# Patient Record
Sex: Male | Born: 1955 | Race: Black or African American | Hispanic: No | Marital: Married | Smoking: Never smoker
Health system: Southern US, Community
[De-identification: ages and names within clinical notes are randomized; demographics above are authoritative.]

## PROBLEM LIST (undated history)

## (undated) DIAGNOSIS — M545 Low back pain, unspecified: Secondary | ICD-10-CM

## (undated) DIAGNOSIS — I10 Essential (primary) hypertension: Secondary | ICD-10-CM

## (undated) DIAGNOSIS — R51 Headache: Secondary | ICD-10-CM

## (undated) DIAGNOSIS — Z550 Illiteracy and low-level literacy: Secondary | ICD-10-CM

## (undated) DIAGNOSIS — K219 Gastro-esophageal reflux disease without esophagitis: Secondary | ICD-10-CM

## (undated) DIAGNOSIS — E785 Hyperlipidemia, unspecified: Secondary | ICD-10-CM

## (undated) DIAGNOSIS — R519 Headache, unspecified: Secondary | ICD-10-CM

## (undated) DIAGNOSIS — G8929 Other chronic pain: Secondary | ICD-10-CM

## (undated) HISTORY — DX: Hyperlipidemia, unspecified: E78.5

## (undated) HISTORY — DX: Gastro-esophageal reflux disease without esophagitis: K21.9

## (undated) HISTORY — DX: Low back pain: M54.5

## (undated) HISTORY — DX: Illiteracy and low-level literacy: Z55.0

## (undated) HISTORY — DX: Headache, unspecified: R51.9

## (undated) HISTORY — DX: Headache: R51

## (undated) HISTORY — DX: Low back pain, unspecified: M54.50

## (undated) HISTORY — DX: Essential (primary) hypertension: I10

## (undated) HISTORY — PX: OTHER SURGICAL HISTORY: SHX169

## (undated) HISTORY — DX: Other chronic pain: G89.29

---

## 1998-03-25 ENCOUNTER — Emergency Department (HOSPITAL_COMMUNITY): Admission: EM | Admit: 1998-03-25 | Discharge: 1998-03-25 | Payer: Self-pay | Admitting: Emergency Medicine

## 1999-09-16 DIAGNOSIS — E1165 Type 2 diabetes mellitus with hyperglycemia: Secondary | ICD-10-CM

## 1999-09-16 DIAGNOSIS — E118 Type 2 diabetes mellitus with unspecified complications: Secondary | ICD-10-CM

## 1999-09-16 DIAGNOSIS — E1140 Type 2 diabetes mellitus with diabetic neuropathy, unspecified: Secondary | ICD-10-CM | POA: Insufficient documentation

## 1999-09-16 DIAGNOSIS — IMO0002 Reserved for concepts with insufficient information to code with codable children: Secondary | ICD-10-CM | POA: Insufficient documentation

## 2002-02-27 ENCOUNTER — Emergency Department (HOSPITAL_COMMUNITY): Admission: EM | Admit: 2002-02-27 | Discharge: 2002-02-28 | Payer: Self-pay | Admitting: Emergency Medicine

## 2006-03-17 HISTORY — PX: LUMBAR LAMINECTOMY: SHX95

## 2006-05-15 ENCOUNTER — Encounter: Admission: RE | Admit: 2006-05-15 | Discharge: 2006-06-04 | Payer: Self-pay | Admitting: Internal Medicine

## 2006-05-20 ENCOUNTER — Emergency Department (HOSPITAL_COMMUNITY): Admission: EM | Admit: 2006-05-20 | Discharge: 2006-05-21 | Payer: Self-pay | Admitting: Emergency Medicine

## 2006-06-08 ENCOUNTER — Encounter: Admission: RE | Admit: 2006-06-08 | Discharge: 2006-06-18 | Payer: Self-pay | Admitting: Emergency Medicine

## 2006-06-28 ENCOUNTER — Emergency Department (HOSPITAL_COMMUNITY): Admission: EM | Admit: 2006-06-28 | Discharge: 2006-06-28 | Payer: Self-pay | Admitting: Emergency Medicine

## 2006-07-14 ENCOUNTER — Encounter: Payer: Self-pay | Admitting: Internal Medicine

## 2006-10-28 ENCOUNTER — Ambulatory Visit (HOSPITAL_COMMUNITY): Admission: RE | Admit: 2006-10-28 | Discharge: 2006-10-29 | Payer: Self-pay | Admitting: Neurosurgery

## 2007-05-05 ENCOUNTER — Encounter: Admission: RE | Admit: 2007-05-05 | Discharge: 2007-05-05 | Payer: Self-pay | Admitting: Neurosurgery

## 2008-04-30 ENCOUNTER — Encounter: Admission: RE | Admit: 2008-04-30 | Discharge: 2008-04-30 | Payer: Self-pay | Admitting: Neurosurgery

## 2008-09-15 ENCOUNTER — Ambulatory Visit (HOSPITAL_COMMUNITY): Admission: RE | Admit: 2008-09-15 | Discharge: 2008-09-15 | Payer: Self-pay | Admitting: Internal Medicine

## 2008-09-15 ENCOUNTER — Encounter: Payer: Self-pay | Admitting: Internal Medicine

## 2008-09-15 ENCOUNTER — Ambulatory Visit: Payer: Self-pay | Admitting: Internal Medicine

## 2008-09-15 DIAGNOSIS — M545 Low back pain, unspecified: Secondary | ICD-10-CM | POA: Insufficient documentation

## 2008-09-15 DIAGNOSIS — K219 Gastro-esophageal reflux disease without esophagitis: Secondary | ICD-10-CM | POA: Insufficient documentation

## 2008-09-15 DIAGNOSIS — K5900 Constipation, unspecified: Secondary | ICD-10-CM | POA: Insufficient documentation

## 2008-09-15 DIAGNOSIS — R262 Difficulty in walking, not elsewhere classified: Secondary | ICD-10-CM | POA: Insufficient documentation

## 2008-09-15 DIAGNOSIS — M549 Dorsalgia, unspecified: Secondary | ICD-10-CM | POA: Insufficient documentation

## 2008-09-19 ENCOUNTER — Encounter: Payer: Self-pay | Admitting: Internal Medicine

## 2009-05-08 ENCOUNTER — Telehealth: Payer: Self-pay | Admitting: Internal Medicine

## 2009-05-16 ENCOUNTER — Telehealth: Payer: Self-pay | Admitting: Internal Medicine

## 2009-05-29 ENCOUNTER — Ambulatory Visit: Payer: Self-pay | Admitting: Infectious Diseases

## 2009-05-29 LAB — CONVERTED CEMR LAB: Hgb A1c MFr Bld: 12.4 %

## 2009-05-30 ENCOUNTER — Telehealth: Payer: Self-pay | Admitting: Internal Medicine

## 2009-06-01 ENCOUNTER — Ambulatory Visit: Payer: Self-pay | Admitting: Infectious Diseases

## 2009-06-01 DIAGNOSIS — I10 Essential (primary) hypertension: Secondary | ICD-10-CM | POA: Insufficient documentation

## 2009-06-01 LAB — CONVERTED CEMR LAB
CO2: 26 meq/L (ref 19–32)
Calcium: 9.2 mg/dL (ref 8.4–10.5)
Creatinine, Ser: 0.91 mg/dL (ref 0.40–1.50)
Creatinine, Urine: 88.3 mg/dL
HDL: 54 mg/dL (ref >39)
LDL Cholesterol: 81 mg/dL (ref 0–99)
Microalb, Ur: 0.5 mg/dL (ref 0.00–1.89)
Total CHOL/HDL Ratio: 2.7

## 2009-06-07 ENCOUNTER — Ambulatory Visit: Payer: Self-pay | Admitting: Internal Medicine

## 2009-06-07 LAB — CONVERTED CEMR LAB: Blood Glucose, Home Monitor: 2 mg/dL

## 2009-06-19 ENCOUNTER — Ambulatory Visit: Payer: Self-pay | Admitting: Internal Medicine

## 2009-07-09 ENCOUNTER — Ambulatory Visit: Payer: Self-pay | Admitting: Internal Medicine

## 2009-07-09 LAB — CONVERTED CEMR LAB: Blood Glucose, Fingerstick: 130

## 2009-07-24 ENCOUNTER — Encounter (INDEPENDENT_AMBULATORY_CARE_PROVIDER_SITE_OTHER): Payer: Self-pay | Admitting: *Deleted

## 2009-07-24 ENCOUNTER — Inpatient Hospital Stay (HOSPITAL_COMMUNITY): Admission: EM | Admit: 2009-07-24 | Discharge: 2009-07-26 | Payer: Self-pay | Admitting: Emergency Medicine

## 2009-08-10 ENCOUNTER — Encounter (INDEPENDENT_AMBULATORY_CARE_PROVIDER_SITE_OTHER): Payer: Self-pay | Admitting: *Deleted

## 2009-09-11 ENCOUNTER — Ambulatory Visit: Payer: Self-pay | Admitting: Internal Medicine

## 2009-09-11 DIAGNOSIS — J309 Allergic rhinitis, unspecified: Secondary | ICD-10-CM | POA: Insufficient documentation

## 2009-09-11 LAB — CONVERTED CEMR LAB: Hgb A1c MFr Bld: 9.3 %

## 2009-09-12 ENCOUNTER — Encounter: Payer: Self-pay | Admitting: Internal Medicine

## 2009-09-13 ENCOUNTER — Telehealth: Payer: Self-pay | Admitting: Internal Medicine

## 2009-09-13 ENCOUNTER — Ambulatory Visit: Payer: Self-pay | Admitting: Gastroenterology

## 2009-09-13 DIAGNOSIS — R51 Headache: Secondary | ICD-10-CM

## 2009-09-13 DIAGNOSIS — R519 Headache, unspecified: Secondary | ICD-10-CM | POA: Insufficient documentation

## 2009-09-13 LAB — CONVERTED CEMR LAB
ALT: 22 units/L (ref 0–53)
AST: 19 units/L (ref 0–37)
Albumin: 4 g/dL (ref 3.5–5.2)
Alkaline Phosphatase: 55 units/L (ref 39–117)
BUN: 8 mg/dL (ref 6–23)
Calcium: 9.5 mg/dL (ref 8.4–10.5)
Chloride: 99 meq/L (ref 96–112)
Creatinine, Ser: 0.7 mg/dL (ref 0.4–1.5)
GFR calc non Af Amer: 150.93 mL/min (ref >60)
Glucose, Bld: 282 mg/dL — ABNORMAL HIGH (ref 70–99)
Iron: 103 ug/dL (ref 42–165)
Lipase: 32 units/L (ref 11.0–59.0)
Lymphs Abs: 1.7 10*3/uL (ref 0.7–4.0)
MCHC: 34.5 g/dL (ref 30.0–36.0)
MCV: 84.1 fL (ref 78.0–100.0)
Monocytes Relative: 10.5 % (ref 3.0–12.0)
Neutrophils Relative %: 40.2 % — ABNORMAL LOW (ref 43.0–77.0)
Platelets: 206 10*3/uL (ref 150.0–400.0)
RBC: 4.87 M/uL (ref 4.22–5.81)
RDW: 14.1 % (ref 11.5–14.6)
Saturation Ratios: 27.4 % (ref 20.0–50.0)
TSH: 1.48 microintl units/mL (ref 0.35–5.50)
Total Bilirubin: 1.1 mg/dL (ref 0.3–1.2)
Total Protein: 7.3 g/dL (ref 6.0–8.3)
Transferrin: 268.7 mg/dL (ref 212.0–360.0)

## 2009-09-14 ENCOUNTER — Encounter: Payer: Self-pay | Admitting: Gastroenterology

## 2009-09-19 ENCOUNTER — Encounter: Payer: Self-pay | Admitting: Internal Medicine

## 2009-09-24 ENCOUNTER — Telehealth (INDEPENDENT_AMBULATORY_CARE_PROVIDER_SITE_OTHER): Payer: Self-pay | Admitting: *Deleted

## 2009-09-28 ENCOUNTER — Ambulatory Visit: Payer: Self-pay | Admitting: Internal Medicine

## 2009-09-28 ENCOUNTER — Telehealth: Payer: Self-pay | Admitting: Internal Medicine

## 2009-09-28 LAB — CONVERTED CEMR LAB: Blood Glucose, Home Monitor: 3 mg/dL

## 2009-10-02 ENCOUNTER — Telehealth: Payer: Self-pay | Admitting: Internal Medicine

## 2009-10-12 ENCOUNTER — Telehealth (INDEPENDENT_AMBULATORY_CARE_PROVIDER_SITE_OTHER): Payer: Self-pay | Admitting: *Deleted

## 2009-10-23 ENCOUNTER — Ambulatory Visit: Payer: Self-pay | Admitting: Gastroenterology

## 2009-10-23 LAB — HM COLONOSCOPY: HM Colonoscopy: NORMAL

## 2009-10-23 LAB — CONVERTED CEMR LAB: UREASE: POSITIVE

## 2009-10-26 ENCOUNTER — Encounter: Payer: Self-pay | Admitting: Gastroenterology

## 2009-11-13 ENCOUNTER — Encounter: Payer: Self-pay | Admitting: Internal Medicine

## 2009-11-13 ENCOUNTER — Ambulatory Visit: Payer: Self-pay | Admitting: Internal Medicine

## 2009-11-16 ENCOUNTER — Ambulatory Visit: Payer: Self-pay | Admitting: Internal Medicine

## 2009-11-16 ENCOUNTER — Encounter: Payer: Self-pay | Admitting: Internal Medicine

## 2009-11-16 DIAGNOSIS — M2550 Pain in unspecified joint: Secondary | ICD-10-CM | POA: Insufficient documentation

## 2009-11-16 LAB — CONVERTED CEMR LAB
Bilirubin Urine: NEGATIVE
Blood in Urine, dipstick: NEGATIVE
Chloride: 104 meq/L (ref 96–112)
Creatinine, Ser: 0.83 mg/dL (ref 0.40–1.50)
Glucose, Bld: 125 mg/dL — ABNORMAL HIGH (ref 70–99)
Hemoglobin: 14.5 g/dL (ref 13.0–17.0)
Hgb A1c MFr Bld: 7.2 %
MCHC: 36.4 g/dL — ABNORMAL HIGH (ref 30.0–36.0)
Nitrite: NEGATIVE
Potassium: 4.3 meq/L (ref 3.5–5.3)
RDW: 13.7 % (ref 11.5–15.5)
Rhuematoid fact SerPl-aCnc: <20 intl units/mL (ref 0–20)
Sed Rate: 2 mm/hr (ref 0–16)
WBC Urine, dipstick: NEGATIVE
WBC: 5.5 10*3/uL (ref 4.0–10.5)

## 2009-11-28 ENCOUNTER — Telehealth: Payer: Self-pay | Admitting: Licensed Clinical Social Worker

## 2009-11-29 ENCOUNTER — Encounter: Payer: Self-pay | Admitting: Internal Medicine

## 2009-11-29 LAB — HM DIABETES EYE EXAM

## 2009-12-10 ENCOUNTER — Telehealth (INDEPENDENT_AMBULATORY_CARE_PROVIDER_SITE_OTHER): Payer: Self-pay | Admitting: *Deleted

## 2009-12-18 ENCOUNTER — Telehealth (INDEPENDENT_AMBULATORY_CARE_PROVIDER_SITE_OTHER): Payer: Self-pay | Admitting: *Deleted

## 2010-01-09 ENCOUNTER — Encounter: Payer: Self-pay | Admitting: Internal Medicine

## 2010-01-09 ENCOUNTER — Encounter
Admission: RE | Admit: 2010-01-09 | Discharge: 2010-03-05 | Payer: Self-pay | Source: Home / Self Care | Attending: Internal Medicine | Admitting: Internal Medicine

## 2010-01-22 ENCOUNTER — Ambulatory Visit: Payer: Self-pay | Admitting: Internal Medicine

## 2010-01-22 LAB — CONVERTED CEMR LAB: Blood Glucose, Fingerstick: 138

## 2010-01-22 LAB — HM DIABETES FOOT EXAM: HM Diabetic Foot Exam: NORMAL

## 2010-01-28 ENCOUNTER — Encounter: Payer: Self-pay | Admitting: Internal Medicine

## 2010-04-14 LAB — CONVERTED CEMR LAB
BUN: 14 mg/dL (ref 6–23)
CO2: 23 meq/L (ref 19–32)
Glucose, Bld: 133 mg/dL — ABNORMAL HIGH (ref 70–99)
LDL Cholesterol: 86 mg/dL (ref 0–99)
Microalb Creat Ratio: 6.2 mg/g (ref 0.0–30.0)
Potassium: 4.2 meq/L (ref 3.5–5.3)
Sodium: 140 meq/L (ref 135–145)
TSH: 0.92 microintl units/mL (ref 0.350–4.500)
Total CHOL/HDL Ratio: 2.9
VLDL: 13 mg/dL (ref 0–40)

## 2010-04-16 NOTE — Assessment & Plan Note (Signed)
Summary: FLP AND BP CHECK/CH   Vital Signs:  Patient profile:   55 year old male Height:      69 inches (175.26 cm) Weight:      151.6 pounds (69.18 kg) BMI:     22.47 Temp:     94.1 degrees F (34.50 degrees C) oral Pulse rate:   67 / minute BP sitting:   134 / 88  (right arm) Cuff size:   regular  Vitals Entered By: Theotis Barrio NT II (June 01, 2009 10:56 AM) CC: PATIENT HERE ONLY FOR BP CHECK AND LABS  Have you ever been in a relationship where you felt threatened, hurt or afraid?No   Does patient need assistance? Functional Status Self care Ambulation Normal Comments PATIENT IS HERE ONLY FOR LABS AND BP CHECK   CC:  PATIENT HERE ONLY FOR BP CHECK AND LABS.  History of Present Illness: Mitchell Page is in here for a short term follow up. Recently started back on his DM meds after about a 66mo hiatus, when he went back home to [place] and missed all his meds. He doesn't have any new complaints today. Came here fasting for an FLP.    Preventive Screening-Counseling & Management  Alcohol-Tobacco     Alcohol drinks/day: 0     Smoking Status: never  Caffeine-Diet-Exercise     Caffeine use/day: 0     Does Patient Exercise: no  Current Medications (verified): 1)  Tramadol Hcl 50 Mg Tabs (Tramadol Hcl) .... One Tab As Needed For Back Pain, Up To Four Times A Day 2)  Omeprazole 40 Mg Cpdr (Omeprazole) .... Take 1 Tablet By Mouth Two Times A Day 3)  Naprosyn 500 Mg Tabs (Naproxen) .... Take 1 Tablet By Mouth Two Times A Day With Meals 4)  Gabapentin 300 Mg Caps (Gabapentin) .... Take 1 Tablet By Mouth Once A Day For 3 Days Then One Tablet By Mouth Bid 5)  Glyburide 5 Mg Tabs (Glyburide) .... 2 Tabs Q Am 6)  Metformin Hcl 1000 Mg Tabs (Metformin Hcl) .... Take 1 Tablet By Mouth Two Times A Day 7)  Prodigy Blood Glucose Monitor W/device Kit (Blood Glucose Monitoring Suppl) .... Check Your Blood Sugarbefore Breakfast 8)  Prodigy Twist Top Lancets 28g  Misc (Lancets) .... Check  Yoiur Blood Glucose Before Breakfast 9)  Exactech Test  Strp (Glucose Blood) .... Check Your Blood Glucose Before Breakfast 10)  Lisinopril 5 Mg Tabs (Lisinopril) .... Take 1 Tablet By Mouth Once A Day  Allergies (verified): No Known Drug Allergies  Past History:  Past Medical History: Last updated: 09/15/2008 DM, type 2 diagnosed 2001 GERD LBP r/t herniated disc -  chronic;s/p laminectomy in 2008 at [facility]. Patient states that he herniated his spine performing heavy lifting for 10 years at his job. Last MRI 04/2008.   Past Surgical History: Last updated: 09/15/2008 L4-5 spine Laminectomy 2008  Family History: Last updated: 09/15/2008 Unknown per patient  Social History: Last updated: 09/15/2008 Immigrated from [place] in 1990. 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  Risk Factors: Alcohol Use: 0 (06/01/2009) Caffeine Use: 0 (06/01/2009) Exercise: no (06/01/2009)  Risk Factors: Smoking Status: never (06/01/2009)  Review of Systems       Review of System: Negative except per HPI.    Physical Exam  General:  alert and well-developed.   Lungs:  normal respiratory effort and normal breath sounds.  Heart:  normal rate and regular rhythm.   Abdomen:  soft and non-tender.   Pulses:  normal peripheral pulses  Extremities:  no cyanosis, clubbing or edema  Neurologic:  non focal  Psych:  normally interactive.     Impression & Recommendations:  Problem # 1:  DIABETES-TYPE 2 (ICD-250.00) Recently restarted on meds. His A1C is high, will have to monitor closely, he might need insulin treatment. I will have him come see Ms. Victory Dakin for DM education.   Data reviewed: A1c: 12.4 (05/29/2009 1:38:22 PM)  MICROALB/CR: 6.2 (09/15/2008 8:20:00 PM) LDL: 86 (09/15/2008 9:20:00 PM), repeat today.  EYE: normal (09/15/2008 1:44:09 PM) FOOT: yes  (05/29/2009 1:38:22 PM) WEIGHT: 22.47 (06/01/2009 10:44:14 AM)   His updated medication list for this problem includes:    Glyburide 5 Mg Tabs (Glyburide) .Marland Kitchen... 2 tabs q am    Metformin Hcl 1000 Mg Tabs (Metformin hcl) .Marland Kitchen... Take 1 tablet by mouth two times a day    Lisinopril 5 Mg Tabs (Lisinopril) .Marland Kitchen... Take 1 tablet by mouth once a day  Problem # 2:  HYPERTENSION (ICD-401.9) Will start him on low dose ACEi, check b-met in two week's time.   His updated medication list for this problem includes:    Lisinopril 5 Mg Tabs (Lisinopril) .Marland Kitchen... Take 1 tablet by mouth once a day  Complete Medication List: 1)  Tramadol Hcl 50 Mg Tabs (Tramadol hcl) .... One tab as needed for back pain, up to four times a day 2)  Omeprazole 40 Mg Cpdr (Omeprazole) .... Take 1 tablet by mouth two times a day 3)  Naprosyn 500 Mg Tabs (Naproxen) .... Take 1 tablet by mouth two times a day with meals 4)  Gabapentin 300 Mg Caps (Gabapentin) .... Take 1 tablet by mouth once a day for 3 days then one tablet by mouth bid 5)  Glyburide 5 Mg Tabs (Glyburide) .... 2 tabs q am 6)  Metformin Hcl 1000 Mg Tabs (Metformin hcl) .... Take 1 tablet by mouth two times a day 7)  Prodigy Blood Glucose Monitor W/device Kit (Blood glucose monitoring suppl) .... Check your blood sugarbefore breakfast 8)  Prodigy Twist Top Lancets 28g Misc (Lancets) .... Check yoiur blood glucose before breakfast 9)  Exactech Test Strp (Glucose blood) .... Check your blood glucose before breakfast 10)  Lisinopril 5 Mg Tabs (Lisinopril) .... Take 1 tablet by mouth once a day  Patient Instructions: 1)  Please schedule an appointment to see Ms. Jamison Neighbor for help with diabetes and nutrition education. Please bring in your glucose meter with you when you come to see her.  2)  Please schedule a follow-up appointment in 2 weeks, for a repeat blood pressure and b-met.  3)  We will let you know if anything wrong with your lab work.   Prescriptions: LISINOPRIL  5 MG TABS (LISINOPRIL) Take 1 tablet by mouth once a day  #30 x 5   Entered and Authorized by:   Zara Council MD   Signed by:   Zara Council MD on 06/01/2009   Method used:   Faxed to ...       [facility] (retail)       [address] [place], [place]  [address]       Ph: [phone]       Fax: [phone]   RxID:   [id number]   Prevention & Chronic Care Immunizations   Influenza vaccine: Fluvax 3+  (05/29/2009)  Influenza vaccine deferral: Deferred  (06/01/2009)   Influenza vaccine due: 05/29/2009    Tetanus booster: 05/29/2009: Tdap   Td booster deferral: Deferred  (06/01/2009)   Tetanus booster due: 05/30/2019    Pneumococcal vaccine: Not documented   Pneumococcal vaccine deferral: Deferred  (06/01/2009)  Colorectal Screening   Hemoccult: Not documented   Hemoccult action/deferral: Ordered  (05/29/2009)   Hemoccult due: 05/30/2010    Colonoscopy: Not documented   Colonoscopy action/deferral: GI referral  (05/29/2009)  Other Screening   PSA: Not documented   PSA action/deferral: Discussed-PSA declined  (05/29/2009)   Smoking status: never  (06/01/2009)  Diabetes Mellitus   HgbA1C: 12.4  (05/29/2009)    Eye exam: normal  (09/15/2008)   Eye exam due: 09/15/2009    Foot exam: yes  (05/29/2009)   Foot exam action/deferral: Do today   High risk foot: Yes  (05/29/2009)   Foot care education: Done  (05/29/2009)   Foot exam due: 09/15/2009    Urine microalbumin/creatinine ratio: 6.2  (09/15/2008)    Diabetes flowsheet reviewed?: Yes   Progress toward A1C goal: Unchanged  Lipids   Total Cholesterol: 151  (09/15/2008)   Lipid panel action/deferral: Lipid Panel ordered   LDL: 86  (09/15/2008)   LDL Direct: Not documented   HDL: 52  (09/15/2008)   Triglycerides: 66  (09/15/2008)   Lipid panel due: 11/29/2009  Hypertension   Last Blood Pressure: 134 / 88  (06/01/2009)   Serum creatinine: 0.90  (09/15/2008)   Serum  potassium 4.2  (09/15/2008)  Self-Management Support :   Personal Goals (by the next clinic visit) :     Personal A1C goal: 7  (05/29/2009)     Personal blood pressure goal: 130/80  (05/29/2009)     Personal LDL goal: 100  (05/29/2009)    Diabetes self-management support: CBG self-monitoring log, Pre-printed educational material  (05/29/2009)    Hypertension self-management support: Not documented

## 2010-04-16 NOTE — Letter (Signed)
Summary: Patient Notice- Colon Biospy Results  [facility]  [address] [place], [place] [address]   Phone: [phone]  Fax: [phone]        October 26, 2009 MRN: [id number]    [facility] [address] [place], [place]  [address]    Dear Mr. KLICH,  I am pleased to inform you that the biopsies taken during your recent colonoscopy did not show any evidence of cancer upon pathologic examination.  Additional information/recommendations:  __No further action is needed at this time.  Please follow-up with      your primary care physician for your other healthcare needs.  __Please call [phone] to schedule a return visit to review      your condition.  X__Continue with the treatment plan as outlined on the day of your      exam.  __You should have a repeat colonoscopy examination for this problem           in _ years.  Please call us if you are having persistent problems or have questions about your condition that have not been fully answered at this time.  Sincerely,  Mardella Layman MD [facility]   This letter has been electronically signed by your physician.  Appended Document: Patient Notice- Colon Biospy Results letter mailed

## 2010-04-16 NOTE — Progress Notes (Signed)
Summary: Novolog insulin and diabetes testing supplies/dmr  Phone Note Outgoing Call   Call placed by: Jamison Neighbor RD,CDE,  September 28, 2009 2:23 PM Summary of Call: needs diabetes supplies sent to new pharmacy- [facility] [address] [employer] prescription. The meter, strip and lancet prescription was sent to wrong pharmacy- how should we correct that?   Follow-up for Phone Call        Called anfd faxed to [facility] Follow-up by: Angelina Ok RN,  September 28, 2009 4:23 PM    New/Updated Medications: PRODIGY MINI PEN NEEDLES 31G X 5 MM MISC (INSULIN PEN NEEDLE) use to inject lantus insulin once a day and Novolog insulin three times a day as instructed (250.00) NOVOLOG FLEXPEN 100 UNIT/ML SOLN (INSULIN ASPART) inject 4 units before each meal- take 10 minutes before meal (after you check your blood sugar) Prescriptions: PRODIGY MINI PEN NEEDLES 31G X 5 MM MISC (INSULIN PEN NEEDLE) use to inject lantus insulin once a day and Novolog insulin three times a day as instructed (250.00)  #200 x 10   Entered by:   Jamison Neighbor RD,CDE   Authorized by:   Julaine Fusi  DO   Signed by:   Angelina Ok RN on 09/28/2009   Method used:   Electronically to        [facility] [address] (retail)       [address] [place], [place]  [address]       Ph: [phone]       Fax: [phone]   RxID:   [id number] NOVOLOG FLEXPEN 100 UNIT/ML SOLN (INSULIN ASPART) inject 4 units before each meal- take 10 minutes before meal (after you check your blood sugar)  #1 box x 2   Entered by:   Jamison Neighbor RD,CDE   Authorized by:   Julaine Fusi  DO   Signed by:   Angelina Ok RN on 09/28/2009   Method used:   Electronically to        [facility] [address] (retail)       [address] [place], [place]  [address]       Ph: [phone]       Fax: [phone]   RxID:   [id number]

## 2010-04-16 NOTE — Progress Notes (Signed)
Summary: meter & lancet prescription/dmr  Phone Note Call from Patient Call back at Home Phone [phone]   Caller: Patient Summary of Call: Patietn called to ask if his presciptions had been transferred to [facility] on [address]. He reports blood sugars are 163 before breakfast yesterday and 174 before breakfast today: he stopped glyburide and lantus,  Clarified that he was supposed to continue lantus 10 units- he verbalized understanding and said he  would resume lantus 10 units at bedtime tonight. Needs prodigy meter and lancet prescription sent to pharmacy  Initial call taken by: Jamison Neighbor RD,CDE,  October 02, 2009 12:48 PM  Follow-up for Phone Call        Rx Called In Follow-up by: Deatra Robinson MD,  October 02, 2009 3:03 PM    New/Updated Medications: PRODIGY TWIST TOP LANCETS 28G  MISC (LANCETS) use to check blood sugar 4x a day before each meal and at bedtime (250.00) PRODIGY AUTOCODE BLOOD GLUCOSE  DEVI (BLOOD GLUCOSE MONITORING SUPPL) use to check blood sugar 4x a day: before each meal and at bedtime (250.00) Prescriptions: EXACTECH TEST  STRP (GLUCOSE BLOOD) Check your blood glucose before breakfast  #100 x 3   Entered and Authorized by:   Deatra Robinson MD   Signed by:   Deatra Robinson MD on 10/02/2009   Method used:   Electronically to        [facility] [address] (retail)       [address] [place], [place]  [address]       Ph: [phone]       Fax: [phone]   RxID:   [id number] PRODIGY TWIST TOP LANCETS 28G  MISC (LANCETS) Check yoiur blood glucose before breakfast  #100 x 3   Entered and Authorized by:   Deatra Robinson MD   Signed by:   Deatra Robinson MD on 10/02/2009   Method used:   Electronically to        [facility] [address] (retail)       [address] [place], [place]  [address]       Ph: [phone]       Fax: [phone]   RxID:   [id number] PRODIGY BLOOD GLUCOSE  MONITOR W/DEVICE KIT (BLOOD GLUCOSE MONITORING SUPPL) Check your blood sugarbefore breakfast  #1 x 0   Entered and Authorized by:   Deatra Robinson MD   Signed by:   Deatra Robinson MD on 10/02/2009   Method used:   Electronically to        [facility] [address] (retail)       [address] [place], [place]  [address]       Ph: [phone]       Fax: [phone]   RxID:   [id number] PRODIGY AUTOCODE BLOOD GLUCOSE  DEVI (BLOOD GLUCOSE MONITORING SUPPL) use to check blood sugar 4x a day: before each meal and at bedtime (250.00)  #1 x 0   Entered by:   Jamison Neighbor RD,CDE   Authorized by:   Deatra Robinson MD   Signed by:   Deatra Robinson MD on 10/02/2009   Method used:   Electronically to        [facility] [address] (retail)       [address] [place], [place]  [address]       Ph: [phone]       Fax: [phone]   RxID:   [id number] PRODIGY TWIST TOP LANCETS 28G  MISC (LANCETS) use to check blood sugar 4x a day before each meal and at bedtime (250.00)  #200 x 7   Entered by:   Jamison Neighbor RD,CDE   Authorized by:   Deatra Robinson MD   Signed by:   Deatra Robinson MD on 10/02/2009   Method used:   Electronically to        [facility] [address] (retail)       [address] [place], [place]  [address]       Ph: [phone]       Fax: [phone]   RxID:   [id number]

## 2010-04-16 NOTE — Miscellaneous (Signed)
Summary: Phone note.  Called the patient to dollow up on Insulin therapy regimen and/or any hypoglycemic events or any other concerns. No response. Left the meassage to call the clinic with any questions.

## 2010-04-16 NOTE — Progress Notes (Signed)
Summary: refill/gg  Phone Note Refill Request  on May 08, 2009 2:43 PM  Refills Requested: Medication #1:  GLYBURIDE 5 MG TABS 2 tabs q am   Last Refilled: 09/18/2008 # 180 given Last visit with labs 09/15/08   Method Requested: Electronic Initial call taken by: Merrie Roof RN,  May 08, 2009 2:44 PM    Prescriptions: GLYBURIDE 5 MG TABS (GLYBURIDE) 2 tabs q am  #180 x 3   Entered and Authorized by:   Doneen Poisson MD   Signed by:   Doneen Poisson MD on 05/08/2009   Method used:   Electronically to        [facility] [address] (retail)       [phone]       [place], [place]  [address]       Ph: [phone]       Fax: [phone]   RxID:   [id number]

## 2010-04-16 NOTE — Miscellaneous (Signed)
Summary: change in pharm  Clinical Lists Changes  Medications: Rx of MOVIPREP 100 GM  SOLR (PEG-KCL-NACL-NASULF-NA ASC-C) As per prep instructions.;  #1 x 0;  Signed;  Entered by: Harlow Mares CMA (AAMA);  Authorized by: Mardella Layman MD [facility];  Method used: Telephoned to [facility] [phone] [address], [place], [place], [place]  [address], Ph: [phone], Fax: [phone]    Prescriptions: MOVIPREP 100 GM  SOLR (PEG-KCL-NACL-NASULF-NA ASC-C) As per prep instructions.  #1 x 0   Entered by:   Harlow Mares CMA (AAMA)   Authorized by:   Mardella Layman MD [facility]   Signed by:   Harlow Mares CMA (AAMA) on 09/13/2009   Method used:   Telephoned to ...       [facility] [address] [phone]* (retail)       [address]       [place], [place]  [address]       Ph: [phone]       Fax: [phone]   RxID:   [id number]

## 2010-04-16 NOTE — Assessment & Plan Note (Signed)
Summary: NOT HFU OK TO ADD PER HELEN OVERDUE FOR ROUTINE CHECKUP/CH   Vital Signs:  Patient profile:   55 year old male Height:      69 inches (175.26 cm) Weight:      152.2 pounds (69.18 kg) BMI:     22.56 Temp:     98.6 degrees F (37.00 degrees C) oral Pulse rate:   71 / minute BP sitting:   153 / 87  (left arm) Cuff size:   regular  Vitals Entered By: Krystal Eaton Duncan Dull) (May 29, 2009 1:41 PM) CC: routine f/u, low back pain, recurrent pain in right lower leg, reflux worse at night Is Patient Diabetic? Yes Did you bring your meter with you today? No Pain Assessment Patient in pain? yes     Location: lower back Intensity: 3 Type: sharp Onset of pain  chronic off and on since 2008 Nutritional Status BMI of 19 -24 = normal CBG Result 301  Have you ever been in a relationship where you felt threatened, hurt or afraid?No   Does patient need assistance? Functional Status Self care Ambulation Normal   Diabetic Foot Exam Foot Inspection Is there a history of a foot ulcer?              No Is there a foot ulcer now?              No Can the patient see the bottom of their feet?          Yes Are the shoes appropriate in style and fit?          Yes Is there swelling or an abnormal foot shape?          No Are the toenails long?                No Are the toenails thick?                No Are the toenails ingrown?              No Is there heavy callous build-up?              No Is there pain in the calf muscle (Intermittent claudication) when walking?    No Diabetic Foot Care Education Patient educated on appropriate care of diabetic feet.   High Risk Feet? Yes   10-g (5.07) Semmes-Weinstein Monofilament Test Performed by: Toney Rakes          Right Foot          Left Foot Site 1         abnormal         abnormal Site 4         abnormal         abnormal Site 5         abnormal         abnormal Site 6         abnormal         abnormal  Impression      abnormal          abnormal   CC:  routine f/u, low back pain, recurrent pain in right lower leg, and reflux worse at night.  History of Present Illness: 1. DM--was out of his medications for at least 3 months since was out of the country (visiting his family in [place]). Restarted his medications as of 05/08/2009. Reports blurry vision and bilaterall feet pain in a stockings pattern--burning.  Patient lost his glucometer and did not check his CBG's 2. C/o LBP; hx of a herniated disc in 2008 and sp surgery by Dr. Thereasa Parkin. Increase in pain --induced with prolonged sitting or walking; denies any bowel or bladder incontinence; weight loss, fever or sweats; no radiculopathy; pain is 5/10 --relieved with Tramadol "but has to take it more than 4 times daily." patient was not taking his Naprosyn.  3. Increase in heartburn; no regurgitation, hemoptysiks or odynophagia --eats spicy foods and late night meals.  Preventive Screening-Counseling & Management  Alcohol-Tobacco     Alcohol drinks/day: 0     Smoking Status: never  Problems Prior to Update: 1)  Special Screening For Malignant Neoplasms Colon  (ICD-V76.51) 2)  Diabetes-type 2  (ICD-250.00) 3)  Elevated Bp Reading Without Dx Hypertension  (ICD-796.2) 4)  Back Pain, With Radiation, Unspec.  (ICD-724.4) 5)  Walking Difficulty  (ICD-719.7) 6)  Gerd  (ICD-530.81) 7)  Constipation  (ICD-564.00) 8)  Antibiotic Prophylaxis  (ICD-V07.39) 9)  Preventive Health Care  (ICD-V70.0)  Current Problems (verified): 1)  Special Screening For Malignant Neoplasms Colon  (ICD-V76.51) 2)  Diabetes-type 2  (ICD-250.00) 3)  Elevated Bp Reading Without Dx Hypertension  (ICD-796.2) 4)  Back Pain, With Radiation, Unspec.  (ICD-724.4) 5)  Walking Difficulty  (ICD-719.7) 6)  Gerd  (ICD-530.81) 7)  Constipation  (ICD-564.00) 8)  Antibiotic Prophylaxis  (ICD-V07.39) 9)  Preventive Health Care  (ICD-V70.0)  Medications Prior to Update: 1)  Metformin Hcl 1000 Mg Tabs (Metformin Hcl)  .... Take 1 Tablet By Mouth Two Times A Day 2)  Glyburide 5 Mg Tabs (Glyburide) .... 2 Tabs Q Am 3)  Tramadol Hcl 50 Mg Tabs (Tramadol Hcl) .... Take 1 Tablet By Mouth Four Times A Day 4)  Doxycycline Hyclate 100 Mg Caps (Doxycycline Hyclate) .... One Po Daily. Begin 1-2 Days Before Travel To Malarious Areas. Take Daily At The Same Time Each Day While in The Malarious Area and For 4 Weeks After Leaving Such Areas. 5)  Omeprazole 20 Mg Cpdr (Omeprazole) .... Take 1 Tablet By Mouth Two Times A Day 6)  Naprosyn 500 Mg Tabs (Naproxen) .... Take 1 Tablet By Mouth Two Times A Day With Meals  Current Medications (verified): 1)  Metformin Hcl 1000 Mg Tabs (Metformin Hcl) .... Take 1 Tablet By Mouth Two Times A Day 2)  Glyburide 5 Mg Tabs (Glyburide) .... 2 Tabs Q Am 3)  Ultracet 37.5-325 Mg Tabs (Tramadol-Acetaminophen) .... Four Times A Day Tab As Needed  For Back Pain 4)  Doxycycline Hyclate 100 Mg Caps (Doxycycline Hyclate) .... One Po Daily. Begin 1-2 Days Before Travel To Malarious Areas. Take Daily At The Same Time Each Day While in The Malarious Area and For 4 Weeks After Leaving Such Areas. 5)  Omeprazole 40 Mg Cpdr (Omeprazole) .... Take 1 Tablet By Mouth Two Times A Day 6)  Naprosyn 500 Mg Tabs (Naproxen) .... Take 1 Tablet By Mouth Two Times A Day With Meals 7)  Gabapentin 300 Mg Caps (Gabapentin) .... Take 1 Tablet By Mouth Once A Day For 3 Days Then One Tablet By Mouth Bid 8)  Prodigy Blood Glucose Monitor W/device Kit (Blood Glucose Monitoring Suppl) .... Check Your Blood Sugarbefore Breakfast 9)  Prodigy Twist Top Lancets 28g  Misc (Lancets) .... Check Yoiur Blood Glucose Before Breakfast 10)  Exactech Test  Strp (Glucose Blood) .... Check Your Blood Glucose Before Breakfast  Allergies (verified): No Known Drug Allergies  Directives (verified): 1)  Full Code  Past History:  Risk Factors: Alcohol Use: 0 (05/29/2009) Caffeine Use: 0 (09/15/2008) Exercise: no (09/15/2008)  Risk  Factors: Smoking Status: never (05/29/2009)  Past medical, surgical, family and social histories (including risk factors) reviewed, and no changes noted (except as noted below).  Past Medical History: Reviewed history from 09/15/2008 and no changes required. DM, type 2 diagnosed 2001 GERD LBP r/t herniated disc -  chronic;s/p laminectomy in 2008 at [facility]. Patient states that he herniated his spine performing heavy lifting for 10 years at his job. Last MRI 04/2008.   Past Surgical History: Reviewed history from 09/15/2008 and no changes required. L4-5 spine Laminectomy 2008  Family History: Reviewed history from 09/15/2008 and no changes required. Unknown per patient  Social History: Reviewed history from 09/15/2008 and no changes required. Immigrated from [place] in 1990. 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  Review of Systems  The patient denies anorexia, fever, weight loss, weight gain, vision loss, decreased hearing, hoarseness, chest pain, syncope, dyspnea on exertion, peripheral edema, prolonged cough, headaches, hemoptysis, abdominal pain, melena, hematochezia, severe indigestion/heartburn, hematuria, incontinence, genital sores, muscle weakness, suspicious skin lesions, transient blindness, difficulty walking, depression, unusual weight change, abnormal bleeding, enlarged lymph nodes, angioedema, breast masses, and testicular masses.    Physical Exam  General:  alert, well-developed, well-nourished, and well-hydrated.   Head:  normocephalic and atraumatic.   Eyes:  vision grossly intact, pupils equal, pupils round, and pupils reactive to light.   Ears:  R ear normal and L ear normal.   Nose:  Nasal septum deviation to left without obstruction. Mouth:  no gingival abnormalities and fair dentition.   Neck:  supple, full ROM, and no masses.    Lungs:  normal respiratory effort, no intercostal retractions, and normal breath sounds.   Heart:  normal rate, regular rhythm, no murmur, and no JVD.   Abdomen:  soft, non-tender, normal bowel sounds, no distention, no masses, no hepatomegaly, and no splenomegaly.   Msk:  L-3-5 paravertebral mild tenderness to plapation; SLR test negative bilaterally. Pulses:  R and L carotid,radial,femoral,dorsalis pedis and posterior tibial pulses are full and equal bilaterally Extremities:  No clubbing, cyanosis, edema, or deformity noted with normal full range of motion of all joints.   Neurologic:  alert & oriented X3, cranial nerves II-XII intact, strength normal in all extremities, sensation intact to light touch, sensation intact to pinprick, DTRs symmetrical and normal, and finger-to-nose normal.   Skin:  Verical scarover L-3-5; 3cmx05 cm;well healed. Pedunculated flash-colored mole 03 cmin diameter to R lower back. Psych:  Oriented X3, memory intact for recent and remote, normally interactive, good eye contact, not anxious appearing, and not depressed appearing.    Diabetes Management Exam:    Foot Exam (with socks and/or shoes not present):       Sensory-Monofilament:          Left foot: abnormal          Right foot: abnormal   Impression & Recommendations:  Problem # 1:  DIABETES-TYPE 2 (ICD-250.00)  Uncontrolled 2/2 being out of his medic ations for several months (was out of country). REstart his medications. Importance ofadherance with a treatment regimen is emphasized. Foot care reviewed. His updated medication list for this problem includes:    Metformin Hcl 1000 Mg Tabs (Metformin hcl) .Marland Kitchen... Take 1 tablet by mouth two times a day    Glyburide 5  Mg Tabs (Glyburide) .Marland Kitchen... 2 tabs q am  Orders: T-Hgb A1C (in-house) (62130QM) T- Capillary Blood Glucose (57846) T-Lipid Profile ([id number]) T-Urine Microalbumin w/creat. ratio [phone]) Ophthalmology Referral  (Ophthalmology)  Labs Reviewed: Creat: 0.90 (09/15/2008)     Last Eye Exam: normal (09/15/2008) Reviewed HgBA1c results: 12.4 (05/29/2009)  7.5 (09/15/2008)  Problem # 2:  BACK PAIN, WITH RADIATION, UNSPEC. (ICD-724.4)  Likely DJD. Will change tramadol for Ultracet. Continue with  Naprosyn. If not improving --consider reimaging. His updated medication list for this problem includes:    Ultracet 37.5-325 Mg Tabs (Tramadol-acetaminophen) .Marland Kitchen... Four times a day tab as needed  for back pain    Naprosyn 500 Mg Tabs (Naproxen) .Marland Kitchen... Take 1 tablet by mouth two times a day with meals  Discussed use of moist heat or ice, modified activities, medications, and stretching/strengthening exercises. Back care instructions given. To be seen in 2 weeks if no improvement; sooner if worsening of symptoms.   Problem # 3:  GERD (ICD-530.81) Worsened due to the patient's poor diet hygiene. Instructed to not to eat late night meals. Will increase his PPI for 40 mg by mouth two times a day. His updated medication list for this problem includes:    Omeprazole 40 Mg Cpdr (Omeprazole) .Marland Kitchen... Take 1 tablet by mouth two times a day  Problem # 4:  ELEVATED BP READING WITHOUT DX HYPERTENSION (ICD-796.2)  Requested to return to the clinic for a BP check in 3 days. Low salt diet. Orders: T-Basic Metabolic Panel ([id number])  BP today: 153/87 Prior BP: 147/87 (09/15/2008)  Labs Reviewed: Creat: 0.90 (09/15/2008) Chol: 151 (09/15/2008)   HDL: 52 (09/15/2008)   LDL: 86 (09/15/2008)   TG: 66 (09/15/2008)  Instructed in low sodium diet (DASH Handout) and behavior modification.    Complete Medication List: 1)  Metformin Hcl 1000 Mg Tabs (Metformin hcl) .... Take 1 tablet by mouth two times a day 2)  Glyburide 5 Mg Tabs (Glyburide) .... 2 tabs q am 3)  Ultracet 37.5-325 Mg Tabs (Tramadol-acetaminophen) .... Four times a day tab as needed  for back pain 4)  Doxycycline Hyclate 100 Mg Caps (Doxycycline hyclate) ....  One po daily. begin 1-2 days before travel to malarious areas. take daily at the same time each day while in the malarious area and for 4 weeks after leaving such areas. 5)  Omeprazole 40 Mg Cpdr (Omeprazole) .... Take 1 tablet by mouth two times a day 6)  Naprosyn 500 Mg Tabs (Naproxen) .... Take 1 tablet by mouth two times a day with meals 7)  Gabapentin 300 Mg Caps (Gabapentin) .... Take 1 tablet by mouth once a day for 3 days then one tablet by mouth bid 8)  Prodigy Blood Glucose Monitor W/device Kit (Blood glucose monitoring suppl) .... Check your blood sugarbefore breakfast 9)  Prodigy Twist Top Lancets 28g Misc (Lancets) .... Check yoiur blood glucose before breakfast 10)  Exactech Test Strp (Glucose blood) .... Check your blood glucose before breakfast  Other Orders: Gastroenterology Referral (GI) Flu Vaccine 39yrs + (63875) Tdap => 25yrs IM (64332) Admin of Any Addtl Vaccine (95188)  Patient Instructions: 1)  please, return to the clinic in 3 days fasting (on an empty stomach) for a blood test and a recheck on blood pressures. 2)  Take all your medications as prescribed. 3)  Nurse will call you with your appointments for an eye exam and a colonoscopy. 4)  Call with any questions. Prescriptions: GABAPENTIN 300 MG CAPS (GABAPENTIN) Take 1 tablet by  mouth once a day for 3 days then one tablet by mouth bid  #60 x 3   Entered and Authorized by:   Deatra Robinson MD   Signed by:   Deatra Robinson MD on 05/29/2009   Method used:   Faxed to ...       [facility] Department (retail)       [address] [place], [place]  [address]       Ph: [phone]       Fax: [phone]   RxID:   [id number] NAPROSYN 500 MG TABS (NAPROXEN) Take 1 tablet by mouth two times a day with meals  #60 x 5   Entered and Authorized by:   Deatra Robinson MD   Signed by:   Deatra Robinson MD on 05/29/2009   Method used:   Faxed to ...       [facility] Department (retail)        [address] [place], [place]  [address]       Ph: [phone]       Fax: [phone]   RxID:   [id number] METFORMIN HCL 1000 MG TABS (METFORMIN HCL) Take 1 tablet by mouth two times a day  #180 x 3   Entered and Authorized by:   Deatra Robinson MD   Signed by:   Deatra Robinson MD on 05/29/2009   Method used:   Faxed to ...       [facility] Department (retail)       [address] [place], [place]  [address]       Ph: [phone]       Fax: [phone]   RxID:   [id number] GLYBURIDE 5 MG TABS (GLYBURIDE) 2 tabs q am  #180 x 3   Entered and Authorized by:   Deatra Robinson MD   Signed by:   Deatra Robinson MD on 05/29/2009   Method used:   Faxed to ...       [facility] Department (retail)       [address] [place], [place]  [address]       Ph: [phone]       Fax: [phone]   RxID:   [id number] EXACTECH TEST  STRP (GLUCOSE BLOOD) Check your blood glucose before breakfast  #100 x 3   Entered and Authorized by:   Deatra Robinson MD   Signed by:   Deatra Robinson MD on 05/29/2009   Method used:   Faxed to ...       [facility] Department (retail)       [address] [place], [place]  [address]       Ph: [phone]       Fax: [phone]   RxID:   [id number] PRODIGY TWIST TOP LANCETS 28G  MISC (LANCETS) Check yoiur blood glucose before breakfast  #100 x 3   Entered and Authorized by:   Deatra Robinson MD   Signed by:   Deatra Robinson MD on 05/29/2009   Method used:   Faxed to ...       [facility] Department (retail)       [address] [place], [place]  [address]       Ph: [phone]  Fax: [phone]   RxID:   [id number] PRODIGY BLOOD GLUCOSE MONITOR W/DEVICE KIT (BLOOD GLUCOSE MONITORING SUPPL) Check your blood sugarbefore breakfast  #1 x 0   Entered and Authorized by:   Deatra Robinson MD   Signed by:   Deatra Robinson MD on 05/29/2009    Method used:   Faxed to ...       [facility] Department (retail)       [address] [place], [place]  [address]       Ph: [phone]       Fax: [phone]   RxID:   [id number] OMEPRAZOLE 40 MG CPDR (OMEPRAZOLE) Take 1 tablet by mouth two times a day  #60 x 3   Entered and Authorized by:   Deatra Robinson MD   Signed by:   Deatra Robinson MD on 05/29/2009   Method used:   Faxed to ...       [facility] Department (retail)       [address] [place], [place]  [address]       Ph: [phone]       Fax: [phone]   RxID:   [id number] ULTRACET 37.5-325 MG TABS (TRAMADOL-ACETAMINOPHEN) four times a day tab as needed  for back pain  #120 x 3   Entered and Authorized by:   Deatra Robinson MD   Signed by:   Deatra Robinson MD on 05/29/2009   Method used:   Faxed to ...       [facility] Department (retail)       [address] [place], [place]  [address]       Ph: [phone]       Fax: [phone]   RxID:   [id number]  Process Orders Check Orders Results:     [facility]: ABN not required for this insurance Tests Sent for requisitioning (May 29, 2009 4:50 PM):     05/29/2009: [facility] -- T-Lipid Profile [phone] (signed)     05/29/2009: [facility] -- T-Basic Metabolic Panel [phone] (signed)     05/29/2009: [facility] -- T-Urine Microalbumin w/creat. ratio [[id number]] (signed)    Prevention & Chronic Care Immunizations   Influenza vaccine: Fluvax 3+  (05/29/2009)   Influenza vaccine due: 05/29/2009    Tetanus booster: 05/29/2009: Tdap   Tetanus booster due: 05/30/2019    Pneumococcal vaccine: Not documented  Colorectal Screening   Hemoccult: Not documented   Hemoccult action/deferral: Ordered  (05/29/2009)   Hemoccult due: 05/30/2010    Colonoscopy: Not documented   Colonoscopy action/deferral: GI referral   (05/29/2009)  Other Screening   PSA: Not documented   PSA action/deferral: Discussed-PSA declined  (05/29/2009)   Smoking status: never  (05/29/2009)  Diabetes Mellitus   HgbA1C: 12.4  (05/29/2009)    Eye exam: normal  (09/15/2008)   Eye exam due: 09/15/2009    Foot exam: yes  (05/29/2009)   Foot exam action/deferral: Do today   High risk foot: Yes  (05/29/2009)   Foot care education: Done  (05/29/2009)   Foot exam due: 09/15/2009    Urine microalbumin/creatinine ratio: 6.2  (09/15/2008)  Lipids   Total Cholesterol: 151  (09/15/2008)   Lipid panel action/deferral: Lipid Panel ordered   LDL: 86  (09/15/2008)   LDL Direct: Not documented   HDL: 52  (09/15/2008)   Triglycerides: 66  (09/15/2008)  Lipid panel due: 11/29/2009  Self-Management Support :   Personal Goals (by the next clinic visit) :     Personal A1C goal: 7  (05/29/2009)     Personal blood pressure goal: 130/80  (05/29/2009)     Personal LDL goal: 100  (05/29/2009)    Patient will work on the following items until the next clinic visit to reach self-care goals:     Medications and monitoring: take my medicines every day, check my blood sugar  (05/29/2009)     Eating: eat foods that are low in salt, eat baked foods instead of fried foods  (05/29/2009)    Diabetes self-management support: CBG self-monitoring log, Pre-printed educational material  (05/29/2009)   Nursing Instructions: Give Flu vaccine today Give tetanus booster today GI referral for screening colonoscopy (see order) Diabetic foot exam today   Laboratory Results   Blood Tests   Date/Time Received: May 29, 2009 1:59 PM  Date/Time Reported: Burke Keels  May 29, 2009 1:59 PM   HGBA1C: 12.4%   (Normal Range: Non-Diabetic - 3-6%   Control Diabetic - 6-8%) CBG Random:: 301mg /dL      Process Orders Check Orders Results:     [facility]: ABN not required for this insurance Tests Sent for requisitioning  (May 29, 2009 4:50 PM):     05/29/2009: [facility] -- T-Lipid Profile [phone] (signed)     05/29/2009: [facility] -- T-Basic Metabolic Panel [phone] (signed)     05/29/2009: [facility] -- T-Urine Microalbumin w/creat. ratio [[id number]] (signed)  Flu Vaccine Consent Questions     Do you have a history of severe allergic reactions to this vaccine? no    Any prior history of allergic reactions to egg and/or gelatin? no    Do you have a sensitivity to the preservative Thimersol? no    Do you have a past history of Guillan-Barre Syndrome? no    Do you currently have an acute febrile illness? no    Have you ever had a severe reaction to latex? no    Vaccine information given and explained to patient? yes    Are you currently pregnant? no    Lot [id number]   Exp Date:06/14/2009   Manufacturer: [employer]    Site Given  Left Deltoid IM.Krystal Eaton [facility])  May 29, 2009 2:46 PM  Immunizations Administered:  Tetanus Vaccine:    Vaccine Type: Tdap    Site: right deltoid    Mfr: GlaxoSmithKline    Dose: 0.5 ml    Route: IM    Given by: Krystal Eaton ([facility])    Exp. Date: 06/09/2011    Lot #: [id number]    VIS given: 02/02/07 version given May 29, 2009.     Roque Lias  Process Orders Check Orders Results:     [facility]: ABN not required for this insurance Tests Sent for requisitioning (May 29, 2009 4:50 PM):     05/29/2009: [facility] -- T-Lipid Profile [phone] (signed)     05/29/2009: [facility] -- T-Basic Metabolic Panel [id number] (signed)     05/29/2009: [facility] -- T-Urine Microalbumin w/creat. ratio [[id number]] (signed)

## 2010-04-16 NOTE — Progress Notes (Signed)
Summary: diabetes support/dmr  Phone Note Call from Patient Call back at Home Phone [phone]   Caller: Patient Summary of Call: Patient called to clarify that he should not be taking glynase. We reviewed his medicines again and he verbalized that he is tkaing the correct insulin and insulin doses in addiiton to metfomrin. He reports blood sugars are : 120,125,140, feel great , no signs or symptoms of low blood sugar.Asked him to make na appointment wiht CDE in August for follow up. Not due to see doctor until September.   Initial call taken by: Jamison Neighbor RD,CDE,  October 12, 2009 5:34 PM

## 2010-04-16 NOTE — Assessment & Plan Note (Signed)
Summary: dm training/vs  Is Patient Diabetic? Yes Did you bring your meter with you today? Yes CBG Result 130 CBG Device ID his meter   Allergies: No Known Drug Allergies   Complete Medication List: 1)  Tramadol Hcl 50 Mg Tabs (Tramadol hcl) .... One tab as needed for back pain, up to four times a day 2)  Omeprazole 40 Mg Cpdr (Omeprazole) .... Take 1 tablet by mouth two times a day 3)  Naprosyn 500 Mg Tabs (Naproxen) .... Take 1 tablet by mouth two times a day with meals 4)  Gabapentin 300 Mg Caps (Gabapentin) .... Take 1 tablet by mouth once a day for 3 days then one tablet by mouth bid 5)  Glyburide 5 Mg Tabs (Glyburide) .... 2 tabs q am 6)  Metformin Hcl 1000 Mg Tabs (Metformin hcl) .... Take 1 tablet by mouth two times a day 7)  Prodigy Blood Glucose Monitor W/device Kit (Blood glucose monitoring suppl) .... Check your blood sugarbefore breakfast 8)  Prodigy Twist Top Lancets 28g Misc (Lancets) .... Check yoiur blood glucose before breakfast 9)  Exactech Test Strp (Glucose blood) .... Check your blood glucose before breakfast 10)  Lisinopril 10 Mg Tabs (Lisinopril) .... Take 1 tablet by mouth once a day 11)  Acetaminophen 325 Mg Tabs (Acetaminophen) .... Take 1 tablet by mouth four times a day as needed (take it together with tramadol)  Other Orders: DSMT(Medicare) Individual, 30 Minutes (Z6109)  Patient Instructions: 1)  make follow up with Lupita Leash in late June. 2)  Make follow up with doctor in June also 3)  [facility] 4)  Record blood sugars in book  Diabetes Self Management Training  PCP: Deatra Robinson MD Date diagnosed with diabetes: 03/17/1998 Diabetes Type: Type 2 non-insulin Other persons present: no Current smoking Status: never  Diabetes Medications:  Lipid lowering Meds? Yes Comments: brought record of CBGs:  4/19 fasting-124   later in day after eating- 159 4/20            103                                             142 4/21             132                                           171 4/22             154                4/23             136 4/24             114 4/25             130                  Monitoring Self monitoring blood glucose 2 times a day Name of Meter  his meter  Recent Episodes of: Requiring Help from another person  Hyperglycemia : No Hypoglycemia: No Severe Hypoglycemia : No   Carrys Food for Low Blood sugar No Can you tell if your blood sugar is low? Yes  Nutrition assessment Do you read food labels?                                                                          water, diet soda  Activity Limitations  Appropriate physical activity Would you  say you are physically active: Yes Exercise alone:  Yes Diabetes Disease Process  Discussed today State own type of diabetes: Demonstrates competencyState diabetes is treated by meal plan-exercise-medication-monitoring-education: Demonstrates competency Medications State name-action-dose-duration-side effects-and time to take medication: Demonstrates competency    Nutritional Management Identify what foods most often affect blood glucose: Needs review/assistance    Verbalize importance of controlling food portions: Demonstrates competency   State importance of spacing and not omitting meals and snacks: Demonstrates competency   State changes planned for home meals/snacks: Demonstrates competency Monitoring State purpose and frequency of monitoring BG-ketones-HgbA1C  : Demonstrates competency   Perform glucose monitoring/ketone testing and record results correctly: Demonstrates competencyState target blood glucose and HgbA1C goals: Demonstrates competency    Complications Explain proper treatment of hyperglycemia: Demonstrates competencyState the causes- signs and symptoms and prevention of hypoglycemia: Needs review/assistance   Explain proper treatment of hypoglycemia: Needs  review/assistance   Glucose control and the development/prevention of long-term complications: Demonstrates competencyState benefits-risks-and options for improving blood sugar control: Demonstrates competencyB/P and lipid control in the prevention/control of cardiovascular disease: Demonstrates competency Exercise States importance of exercise: Demonstrates competencyStates effect of exercise on blood glucose: Demonstrates competencyVerbalizes safety measures for exercise related to diabetes: Needs review/assistanceDiabetes Management Education Done: 07/09/2009    BEHAVIORAL GOALS INITIAL Monitoring blood glucose levels daily: record CBg in log book and bring to next appointment    BEHAVIORAL GOAL FOLLOW UP Monitoring blood glucose levels daily: Most of the time Specific goal set today: new goal if to record blood sugar sin book provided today and patient instructed on how to use.      Doing well wiht diabetes self managnment.  Diabetes Self Management Support: family and clinic staff Follow-up:2 months

## 2010-04-16 NOTE — Assessment & Plan Note (Signed)
Summary: DM TEACHING/CH   Vital Signs:  Patient profile:   55 year old male Weight:      148.6 pounds BMI:     22.02 Is Patient Diabetic? Yes Did you bring your meter with you today? No   Allergies: No Known Drug Allergies   Complete Medication List: 1)  Tramadol Hcl 50 Mg Tabs (Tramadol hcl) .... One tab as needed for back pain, up to four times a day 2)  Omeprazole 40 Mg Cpdr (Omeprazole) .... Take 1 tablet by mouth two times a day 3)  Naprosyn 500 Mg Tabs (Naproxen) .... Take 1 tablet by mouth two times a day with meals 4)  Gabapentin 300 Mg Caps (Gabapentin) .... Take 1 tablet by mouth once a day for 3 days then one tablet by mouth bid 5)  Glyburide 5 Mg Tabs (Glyburide) .... 2 tabs q am 6)  Metformin Hcl 1000 Mg Tabs (Metformin hcl) .... Take 1 tablet by mouth two times a day 7)  Prodigy Blood Glucose Monitor W/device Kit (Blood glucose monitoring suppl) .... Check your blood sugarbefore breakfast 8)  Prodigy Twist Top Lancets 28g Misc (Lancets) .... Check yoiur blood glucose before breakfast 9)  Exactech Test Strp (Glucose blood) .... Check your blood glucose before breakfast 10)  Lisinopril 10 Mg Tabs (Lisinopril) .... Take 1 tablet by mouth once a day 11)  Acetaminophen 325 Mg Tabs (Acetaminophen) .... Take 1 tablet by mouth four times a day as needed (take it together with tramadol)  Other Orders: DSMT(Medicare) Individual, 30 Minutes (O9629)  Diabetes Self Management Training  PCP: Deatra Robinson MD Date diagnosed with diabetes: 03/17/1998 Diabetes Type: Type 2 non-insulin Other persons present: son Current smoking Status: never  Vital Signs Todays Weight: 148.6lb  in BMI 22.02in-lbs   Diabetes Medications:  Lipid lowering Meds? Yes Comments: has not checked blood sugars since broke arm in May. had IVF in hospital and blood sugars were high. Has had symptoms o f high blood sugar for 2 days checked blood sugar 388 has been taking 4 glyburide a day for a month  now- Mentioned possibility of insulin when explainnign what A1C of 9.3% meant to patient    Monitoring Self monitoring blood glucose 2 times a day Name of Meter  his meter  Recent Episodes of: Requiring Help from another person  Hyperglycemia : Yes Hypoglycemia: No Severe Hypoglycemia : No   Carrys Food for Low Blood sugar No Can you tell if your blood sugar is low? Yes   Estimated /Usual Carb Intake Breakfast # of Carbs/Grams mashed potato, sometime egg Midmorning # of Carbs/Grams fruit 1-2 times a week Lunch # of Carbs/Grams bowl of rice, beans and sauce Dinner # of Carbs/Grams fru fru- corn flour- 2 cups , broccoli and vegetables- green beans  Nutrition assessment What beverages do you drink?  water only, skim  Activity Limitations  Appropriate physical activity Would you  say you are physically active: Yes Diabetes Disease Process  Discussed today Define diabetes in simple terms: Needs review/assistance Medications Demonstrates/verbalizes site selection and rotation for injections Needs review/assistance   Correctly draw up and administer insulin-Byetta-Symlin-glucagon: Needs review/assistance    Nutritional Management Identify what foods most often affect blood glucose: Needs review/assistance     Monitoring  Complications State the causes-signs and symptoms and prevention of Hyperglycemia: Needs review/assistance   Explain proper treatment of hyperglycemia: Demonstrates competency    Exercise Diabetes Management Education Done: 09/11/2009    BEHAVIORAL GOALS INITIAL Monitoring blood glucose levels  daily: check blood sugar at least daily, bring meter to clinic appointments        Diabetes Self Management Support: family and clinic staff Follow-up:2 months

## 2010-04-16 NOTE — Assessment & Plan Note (Signed)
Summary: DIABETES TEACHING/CFB   Vital Signs:  Patient profile:   55 year old male Weight:      149.2 pounds BMI:     22.11 Is Patient Diabetic? Yes Did you bring your meter with you today? Yes Nutritional Status BMI of 19 -24 = normal   Allergies: No Known Drug Allergies   Complete Medication List: 1)  Tramadol Hcl 50 Mg Tabs (Tramadol hcl) .... One tab as needed for back pain, up to four times a day 2)  Omeprazole 40 Mg Cpdr (Omeprazole) .... Take 1 tablet by mouth two times a day 3)  Naprosyn 500 Mg Tabs (Naproxen) .... Take 1 tablet by mouth two times a day with meals 4)  Gabapentin 300 Mg Caps (Gabapentin) .... Take 1 tablet by mouth once a day for 3 days then one tablet by mouth bid 5)  Metformin Hcl 1000 Mg Tabs (Metformin hcl) .... Take 1 tablet by mouth two times a day 6)  Prodigy Blood Glucose Monitor W/device Kit (Blood glucose monitoring suppl) .... Check your blood sugarbefore breakfast 7)  Prodigy Twist Top Lancets 28g Misc (Lancets) .... Check yoiur blood glucose before breakfast 8)  Exactech Test Strp (Glucose blood) .... Check your blood glucose before breakfast 9)  Lisinopril 10 Mg Tabs (Lisinopril) .... Take 1 tablet by mouth once a day 10)  Acetaminophen 325 Mg Tabs (Acetaminophen) .... Take 1 tablet by mouth four times a day as needed (take it together with tramadol) 11)  Truetrack Test Strp (Glucose blood) .... Use to test blood sugar  a one time day 12)  Lantus Solostar 100 Unit/ml Soln (Insulin glargine) .... Inject 10 units under skin each night at bedtime 13)  Prodigy Mini Pen Needles 31g X 5 Mm Misc (Insulin pen needle) .... Use to inject lantus insulin once a day and novolog insulin three times a day as instructed (250.00) 14)  Prodigy No Coding Blood Gluc Strp (Glucose blood) .... Use to check blood sugar twice daily: before breakfast and bedtime( 250.00) 15)  Novolog Flexpen 100 Unit/ml Soln (Insulin aspart) .... Inject 4 units before each meal- take 10  minutes before meal (after you check your blood sugar) 16)  Prodigy Twist Top Lancets 28g Misc (Lancets) .... Use to check blood sugar 4x a day before each meal and at bedtime (250.00) 17)  Prodigy Autocode Blood Glucose Devi (Blood glucose monitoring suppl) .... Use to check blood sugar 4x a day: before each meal and at bedtime (250.00) 18)  Prevpac Misc (Amoxicill-clarithro-lansopraz) .... Take as directed  Other Orders: DSMT (Medicaid) 60 Minutes [phone])  Diabetes Self Management Training  PCP: Deatra Robinson MD Date diagnosed with diabetes: 03/17/1998 Diabetes Type: Type 2 non-insulin Other persons present: no Current smoking Status: never  Vital Signs Todays Weight: 149.2lb  in BMI 22.11in-lbs   Potential Barriers  Transportation  Economic/Supplies  Demonstrates competency  Non-English Speaking  Diabetes Medications:  Lipid lowering Meds? Yes Comments: meter downloaded- most readings are fasting and in target- a few are int he eveing He had two lows and one high blood sugar out of  ~30 readings and knew what caused them- fasting for Ramadan- had self adjusted timing of Novolog to two doses of 4 units at 8 Pm and 11-12 Pm becaue he ate two meals in a row. We reviewed that he is to go back to 4 units before (was taking after) meals three times a day. He is to skip the Novolog if he is goig to skip  a meal. await results of A1C in September.   Current Insulin Use   Rapid/Short Insulin Type:Novolog Dinner Dose: 4  Bedtime Dose:4  Long Acting  Insulin Type:Lantus Breakfast Dose: 10    Monitoring Self monitoring blood glucose 3 times a day Name of Meter  prodigy  Recent Episodes of: Requiring Help from another person  Hyperglycemia : Yes Hypoglycemia: Yes Severe Hypoglycemia : No   Carrys Food for Low Blood sugar No Can you tell if your blood sugar is low? Yes   Would you  say you are physically active: Yes Diabetes Disease Process  Discussed  today  Medications State name-action-dose-duration-side effects-and time to take medication: Demonstrates competency   State appropriate timing of food related to medication: Demonstrates competency    Nutritional Management  Monitoring  Complications State the causes-signs and symptoms and prevention of Hyperglycemia: Demonstrates competency   Explain proper treatment of hyperglycemia: Demonstrates competency   State the causes- signs and symptoms and prevention of hypoglycemia: Demonstrates competency   Explain proper treatment of hypoglycemia: Demonstrates competency   State sick day guidelines and when to contact health care team: Demonstrates competency    Exercise  Lifestyle changes:Goal setting and Problem solving State benefits of making appropriate lifestyle changes: Demonstrates competencyIdentify lifestyle behaviors that need to change: Needs review/assistance   Identify risk factors that interfere with health: Demonstrates competencyDevelop strategies to reduce risk factors: Needs review/assistance   List at least two appropriate community resources: Demonstrates competency Psychosocial Adjustment State three common feelings that might be experienced when learning to cope with diabetes: Demonstrates competencyIdentify two methods to cope with these feelings: Demonstrates competencyIdentify how stress affects diabetes & two sources of stress: Demonstrates competencyName two ways of obtaining support from family/friends: Demonstrates competencyDiabetes Management Education Done: 11/13/2009    BEHAVIORAL GOALS INITIAL Preventing,detecting and treating complications: ALWAYS CARRY treatment for low blood sugar with you Monitoring blood glucose levels daily: test blood sugar at least twice daily        Diabetes Self Management Support: family and clinic staff Follow-up: 3 month follow up for DSMT

## 2010-04-16 NOTE — Letter (Signed)
Summary: BLOOD GLUCOSE REPORT 08/05-08/30/2011  BLOOD GLUCOSE REPORT 08/05-08/30/2011   Imported By: Shon Hough 11/20/2009 13:48:13  _____________________________________________________________________  External Attachment:    Type:   Image     Comment:   External Document

## 2010-04-16 NOTE — Assessment & Plan Note (Signed)
Summary: FU VISIT/DS   Vital Signs:  Patient profile:   55 year old male Weight:      150.6 pounds Is Patient Diabetic? Yes Did you bring your meter with you today? Yes CBG Result 252 CBG Device ID prodigy Comments this CBg was after breakfast- he was 175 before breakfast   Allergies: No Known Drug Allergies   Complete Medication List: 1)  Tramadol Hcl 50 Mg Tabs (Tramadol hcl) .... One tab as needed for back pain, up to four times a day 2)  Omeprazole 40 Mg Cpdr (Omeprazole) .... Take 1 tablet by mouth two times a day 3)  Naprosyn 500 Mg Tabs (Naproxen) .... Take 1 tablet by mouth two times a day with meals 4)  Gabapentin 300 Mg Caps (Gabapentin) .... Take 1 tablet by mouth once a day for 3 days then one tablet by mouth bid 5)  Metformin Hcl 1000 Mg Tabs (Metformin hcl) .... Take 1 tablet by mouth two times a day 6)  Prodigy Blood Glucose Monitor W/device Kit (Blood glucose monitoring suppl) .... Check your blood sugarbefore breakfast 7)  Prodigy Twist Top Lancets 28g Misc (Lancets) .... Check yoiur blood glucose before breakfast 8)  Exactech Test Strp (Glucose blood) .... Check your blood glucose before breakfast 9)  Lisinopril 10 Mg Tabs (Lisinopril) .... Take 1 tablet by mouth once a day 10)  Acetaminophen 325 Mg Tabs (Acetaminophen) .... Take 1 tablet by mouth four times a day as needed (take it together with tramadol) 11)  Truetrack Test Strp (Glucose blood) .... Use to test blood sugar  a one time day 12)  Moviprep 100 Gm Solr (Peg-kcl-nacl-nasulf-na asc-c) .... As per prep instructions. 13)  Lantus Solostar 100 Unit/ml Soln (Insulin glargine) .... Inject 10 units under skin each night at bedtime 14)  Prodigy Mini Pen Needles 31g X 5 Mm Misc (Insulin pen needle) .... Use to inject lantus insulin one time a day (250.00) 15)  Prodigy No Coding Blood Gluc Strp (Glucose blood) .... Use to check blood sugar twice daily: before breakfast and bedtime( 250.00) 16)  Novolog Flexpen  100 Unit/ml Soln (Insulin aspart) .... Inject 4 units before each meal- take 10 minutes before meal and after you check your blood sugar  Other Orders: DSMT(Medicare) Individual, 30 Minutes (G0108) DSMT (Medicaid) 60 Minutes [id number])  Patient Instructions: 1)  stop glynase pills Saturday 2)  Saturday- begin tkaing Novolog 4 units three times a day 10 minutes before meals 3)  Please continue tkaing metformin and lantus the same as you were 4)  Pick up new meter- we need you to test before meals three times a day and if you'd like also before bedtime 5)  make follow uo in 2 weeks wiht Lupita Leash or Doctor  Diabetes Self Management Training  PCP: Deatra Robinson MD Date diagnosed with diabetes: 03/17/1998 Diabetes Type: Type 2 non-insulin Other persons present: no Current smoking Status: never  Vital Signs Todays Weight: 150.6lb  in in-lbs   Assessment Work Hours: Not currently working Sources of Support: family Special needs or Barriers: does not read english, but sone does Affect: Appropriate  Potential Barriers  Transportation  Economic/Supplies  Non-English Speaking  Diabetes Medications:  Lipid lowering Meds? Yes Comments: Patient doing well wiht sel finjections "No problem". CBG logbook reveiwed with patient- had true track form [facility], but now medicaid so will need to switch. does not drive so prefers to use [facility] on [address]. CBgs 130-170 fasting , but one 2 pm readings  of 333 this is taking glyburide, metformin and 10 units lantus each day. Suspect patient may need slight increase in lantus, but obviosly needs prandial insulin. discussed with attenidng physician- he is to stop the glyburide na dstart Novolog as per instructions.     Monitoring Self monitoring blood glucose 3 times a day Name of Meter  prodigy  Recent Episodes of: Requiring Help from another person  Hyperglycemia : Yes Hypoglycemia: No Severe Hypoglycemia : No  Other Assessment Had an  amputation due to diabetes? No Ever had a foot ulcer or infection?           No   Carrys Food for Low Blood sugar No Can you tell if your blood sugar is low? Yes   Would you  say you are physically active: Yes Diabetes Disease Process  Discussed today Define diabetes in simple terms: Demonstrates competency    Medications State name-action-dose-duration-side effects-and time to take medication: Demonstrates competency   State appropriate timing of food related to medication: Demonstrates competency   Demonstrates/verbalizes site selection and rotation for injections Demonstrates competency   Correctly draw up and administer insulin-Byetta-Symlin-glucagon: Demonstrates competency   Describe safe needle/lancet disposal: Demonstrates competency    Nutritional Management Identify what foods most often affect blood glucose: Demonstrates competency    State changes planned for home meals/snacks: Demonstrates competency    Monitoring State purpose and frequency of monitoring BG-ketones-HgbA1C  : Demonstrates competency    Complications State the causes- signs and symptoms and prevention of hypoglycemia: Needs review/assistance   Explain proper treatment of hypoglycemia: Demonstrates competency    Exercise Diabetes Management Education Done: 09/21/2009    BEHAVIORAL GOALS INITIAL Incorporating appropriate nutritional management: keep meals consistent in carb intake as we discussed    BEHAVIORAL GOAL FOLLOW UP  Goal attained  Goal attained      Discussed prevntion, signs an dsymptoms an dtreatemnt of hypoglycemia as well as importance to keep meals consistent in carbs and to always carry treatment for hypoglycemia. patient verbalized understanding Diabetes Self Management Support: family and clinic staff Follow-up:2 weeks to continue DSMT

## 2010-04-16 NOTE — Procedures (Signed)
Summary: Colonoscopy  Patient: [name] Note: All result statuses are Final unless otherwise noted.  Tests: (1) Colonoscopy (COL)   COL Colonoscopy           DONE     [facility]     [address]     [place], [place]  [address]           COLONOSCOPY PROCEDURE REPORT           PATIENT:  Mitchell Page, Mitchell Page  MR#:  [id number]     BIRTHDATE:  Jun 13, 1955, 54 yrs. old  GENDER:  male     ENDOSCOPIST:  Vania Rea. Jarold Motto, MD, [facility]     REF. BY:     PROCEDURE DATE:  10/23/2009     PROCEDURE:  Colonoscopy with biopsy     ASA CLASS:  Class II     INDICATIONS:  constipation, hematochezia     MEDICATIONS:   Fentanyl 50 mcg IV, Versed 5 mg IV           DESCRIPTION OF PROCEDURE:   After the risks benefits and     alternatives of the procedure were thoroughly explained, informed     consent was obtained.  Digital rectal exam was performed and     revealed no abnormalities.   The LB [id number] endoscope     was introduced through the anus and advanced to the cecum, which     was identified by both the appendix and ileocecal valve, without     limitations.  The quality of the prep was adequate, using     MoviPrep.  The instrument was then slowly withdrawn as the colon     was fully examined.     <<PROCEDUREIMAGES>>           FINDINGS:  There were multiple polyps identified and removed. in     the recto-sigmoid colon. SMALL HYPERPLASTIC NODULES BIOPSIED.     This was otherwise a normal examination of the colon.   Retroflexed     views in the rectum revealed no abnormalities.    The scope was     then withdrawn from the patient and the procedure completed.           COMPLICATIONS:  None     ENDOSCOPIC IMPRESSION:     1) Polyps, multiple in the recto-sigmoid colon     2) Otherwise normal examination     CHRONIC FUNCTIONAL CONSTIPATION.R/O ADENOMAS.     RECOMMENDATIONS:     1) Repeat colonoscopy in 5 years if polyp adenomatous; otherwise     10 years     2) Continue  current medications     3) high fiber diet     REPEAT EXAM:  No           ______________________________     Vania Rea. Jarold Motto, MD, [facility]           CC:           n.     eSIGNED:   Vania Rea. Patterson at 10/23/2009 10:30 AM           Dossie Der, [phone]  Note: An exclamation mark (!) indicates a result that was not dispersed into the flowsheet. Document Creation Date: 10/23/2009 10:31 AM _______________________________________________________________________  (1) Order result status: Final Collection or observation date-time: 10/23/2009 10:09 Requested date-time:  Receipt date-time:  Reported date-time:  Referring Physician:   Ordering Physician: Sheryn Bison [phone]) Specimen Source:  Source: Launa Grill  Order Number: [id number] Lab site:   Appended Document: Colonoscopy     Procedures Next Due Date:    Colonoscopy: 10/2019

## 2010-04-16 NOTE — Consult Note (Signed)
Summary: Mitchell Page  Mitchell Page   Imported By: Louretta Parma 01/16/2010 15:26:56  _____________________________________________________________________  External Attachment:    Type:   Image     Comment:   External Document  Appended Document: [name]   Diabetic Eye Exam  Procedure date:  11/29/2009  Findings:      Mild non-proliferative diabetic retinopathy.  OU   Procedures Next Due Date:    Diabetic Eye Exam: 11/2010   Diabetic Eye Exam  Procedure date:  11/29/2009  Findings:      Mild non-proliferative diabetic retinopathy.  OU   Procedures Next Due Date:    Diabetic Eye Exam: 11/2010

## 2010-04-16 NOTE — Assessment & Plan Note (Signed)
Summary: DM TEACHING/CH   Allergies: No Known Drug Allergies   Complete Medication List: 1)  Tramadol Hcl 50 Mg Tabs (Tramadol hcl) .... One tab as needed for back pain, up to four times a day 2)  Omeprazole 40 Mg Cpdr (Omeprazole) .... Take 1 tablet by mouth two times a day 3)  Naprosyn 500 Mg Tabs (Naproxen) .... Take 1 tablet by mouth two times a day with meals 4)  Gabapentin 300 Mg Caps (Gabapentin) .... Take 1 tablet by mouth once a day for 3 days then one tablet by mouth bid 5)  Glyburide 5 Mg Tabs (Glyburide) .... 2 tabs q am 6)  Metformin Hcl 1000 Mg Tabs (Metformin hcl) .... Take 1 tablet by mouth two times a day 7)  Prodigy Blood Glucose Monitor W/device Kit (Blood glucose monitoring suppl) .... Check your blood sugarbefore breakfast 8)  Prodigy Twist Top Lancets 28g Misc (Lancets) .... Check yoiur blood glucose before breakfast 9)  Exactech Test Strp (Glucose blood) .... Check your blood glucose before breakfast 10)  Lisinopril 5 Mg Tabs (Lisinopril) .... Take 1 tablet by mouth once a day 11)  Acetaminophen 325 Mg Tabs (Acetaminophen) .... Take 1 tablet by mouth four times a day as needed (take it together with tramadol)  Other Orders: DSMT(Medicare) Individual, 30 Minutes (E4540)  Diabetes Self Management Training  PCP: Deatra Robinson MD Date diagnosed with diabetes: 03/17/1998 Diabetes Type: Type 2 non-insulin Current smoking Status: never  Diabetes Medications:  Comments: brought meter-we went over skills for using and how to interpret results today to guide him on how much carb food to eat. patient verbalized very good understanding of this concept. Cannot read, but said his son will read the information givien to him today to reinforce his an dhis family;s understading of what he needs to do to ocntrol hsi blood sugars. He is tkaing his medications correctly and without difficlty.  Meter averae 149 over the past 2 weeks- moslty check in am a few times in a row. %0  strips rpovided toady and encouragement for before nad 2 hours eahc meal check preiodically beore next visits and one day food and CBg record.    Monitoring Self monitoring blood glucose 2 times a day Name of Meter  True Track  Recent Episodes of: Requiring Help from another person  DKA: No Hypoglycemia: No HHNK: No Severe Hypoglycemia : No     Estimated /Usual Carb Intake Breakfast # of Carbs/Grams corn mil- water and 1/2-1 an 1/2 cups corn flour to 20 ounces or so of water. ususlaly eats eggs and vegetables when wife up and cooks for him.  Nutrition assessment Weight change: Loss Amount of change: From 178 before he has diabetes to now  ~ 152# is fine with his weight currently Diabetes Disease Process  Discussed today State own type of diabetes: Needs review/assistanceState diabetes is treated by meal plan-exercise-medication-monitoring-education: Needs review/assistance Medications State name-action-dose-duration-side effects-and time to take medication: Needs review/assistanceState appropriate timing of food related to medication: Demonstrates competency Nutritional Management Identify what foods most often affect blood glucose: No knowledge    Verbalize importance of controlling food portions: No knowledge   State importance of spacing and not omitting meals and snacks: No knowledge   State changes planned for home meals/snacks: No knowledge    Monitoring State purpose and frequency of monitoring BG-ketones-HgbA1C  : No knowledge   Perform glucose monitoring/ketone testing and record results correctly: Needs review/assistance    State target blood glucose and HgbA1C  goals: Needs review/assistance    Complications State the causes-signs and symptoms and prevention of Hyperglycemia: Needs review/assistance   Explain proper treatment of hyperglycemia: Needs review/assistance   State the causes- signs and symptoms and prevention of hypoglycemia: No knowledgeExplain  proper treatment of hypoglycemia: No knowledge Exercise  Lifestyle changes:Goal setting and Problem solving State benefits of making appropriate lifestyle changes: Needs review/assistanceIdentify lifestyle behaviors that need to change: Needs review/assistance   Identify risk factors that interfere with health: Needs review/assistanceDevelop strategies to reduce risk factors: Needs review/assistance   Verbalize need for and frequency of health care follow-up: Needs review/assistance   Identify Family/SO role in managing diabetes: Demonstrates competencyDiabetes Management Education Done: 06/07/2009    BEHAVIORAL GOALS INITIAL Monitoring blood glucose levels daily: test beofre nad 2 ours after eating one time a day- please keep one day food and CBg record prior to returning- your wife is welcome to join [place]        Diabetes Self Management Support: family and clinic staff Follow-up:2-4 weeks with wife if he'd like he to come

## 2010-04-16 NOTE — Miscellaneous (Signed)
Summary: phone note  Clinical Lists Changes Spoke with Clydie Braun  and Annette Stable at the pharmacy. Clarified that the patient was discontinued off novolog and novolin. Started on Lantus 10 Units Subcutaneously hs. Patient notified of the changes to his insulin regimen.

## 2010-04-16 NOTE — Assessment & Plan Note (Signed)
Summary: CHECKUP/SB.   Vital Signs:  Patient profile:   55 year old male Height:      69 inches (175.26 cm) Weight:      146.6 pounds (66.64 kg) BMI:     21.73 Temp:     97.2 degrees F (36.22 degrees C) oral Pulse rate:   81 / minute BP sitting:   129 / 84  (left arm) Cuff size:   regular  Vitals Entered By: Theotis Barrio NT II (November 16, 2009 10:21 AM) CC: HAS BEEN FEELING BAD SICK FOR ABOUT 3 DAYS / COLD ALL THE TIME / UNABLE TO EAT /NON PRODUCTIVE COUGH / FLU SHOT Is Patient Diabetic? Yes Did you bring your meter with you today? Yes Nutritional Status BMI of 19 -24 = normal  Have you ever been in a relationship where you felt threatened, hurt or afraid?No   Does patient need assistance? Functional Status Self care Ambulation Normal   Primary Care Provider:  Deatra Robinson, MD   CC:  HAS BEEN FEELING BAD SICK FOR ABOUT 3 DAYS / COLD ALL THE TIME / UNABLE TO EAT /NON PRODUCTIVE COUGH / FLU SHOT.  History of Present Illness: Follow up appointment: 1. c/o body aches, subjective fever,chills, dry cough for 3 days. Takes Tylenol and Naprosyn without relief.  2. DM -> No hypoglycemic events. Patient just completed a 30 days Ramadan fast. did not use Novolog during that time; ; used only Lantus during fasting period. Reports feeling "weak," and a sensation of a "cotton mouth." 3. Patient would like to apply for disability for chronic LBP and Left UE. Tramadol provides temporary relief; however, patient reports inability either to seat or stand for more than 30 minutes at a time. Patient had a full work up by a psine specialist in the past.  Preventive Screening-Counseling & Management  Alcohol-Tobacco     Alcohol drinks/day: 0     Smoking Status: never  Caffeine-Diet-Exercise     Caffeine use/day: 0     Does Patient Exercise: yes     Type of exercise: walking     Exercise (avg: min/session): 30-60     Times/week: 5     Exercise Counseling: not indicated; exercise is  adequate  Current Problems (verified): 1)  Headache, Chronic  (ICD-784.0) 2)  Rhinitis  (ICD-472.0) 3)  Diabetes-type 2  (ICD-250.00) 4)  Hypertension  (ICD-401.9) 5)  Back Pain, With Radiation, Unspec.  (ICD-724.4) 6)  Walking Difficulty  (ICD-719.7) 7)  Gerd  (ICD-530.81) 8)  Constipation  (ICD-564.00) 9)  Antibiotic Prophylaxis  (ICD-V07.39) 10)  Preventive Health Care  (ICD-V70.0)  Current Medications (verified): 1)  Tramadol Hcl 50 Mg Tabs (Tramadol Hcl) .... One Tab As Needed For Back Pain, Up To Four Times A Day 2)  Omeprazole 40 Mg Cpdr (Omeprazole) .... Take 1 Tablet By Mouth Two Times A Day 3)  Naprosyn 500 Mg Tabs (Naproxen) .... Take 1 Tablet By Mouth Two Times A Day With Meals 4)  Gabapentin 300 Mg Caps (Gabapentin) .... Take 1 Tablet By Mouth Once A Day For 3 Days Then One Tablet By Mouth Bid 5)  Metformin Hcl 1000 Mg Tabs (Metformin Hcl) .... Take 1 Tablet By Mouth Two Times A Day 6)  Prodigy Blood Glucose Monitor W/device Kit (Blood Glucose Monitoring Suppl) .... Check Your Blood Sugarbefore Breakfast 7)  Prodigy Twist Top Lancets 28g  Misc (Lancets) .... Check Yoiur Blood Glucose Before Breakfast 8)  Exactech Test  Strp (Glucose Blood) .... Check Your  Blood Glucose Before Breakfast 9)  Lisinopril 10 Mg Tabs (Lisinopril) .... Take 1 Tablet By Mouth Once A Day 10)  Acetaminophen 325 Mg Tabs (Acetaminophen) .... Take 1 Tablet By Mouth Four Times A Day As Needed (Take It Together With Tramadol) 11)  Truetrack Test  Strp (Glucose Blood) .... Use To Test Blood Sugar  A One Time Day 12)  Lantus Solostar 100 Unit/ml Soln (Insulin Glargine) .... Inject 10 Units Under Skin Each Night At Bedtime 13)  Prodigy Mini Pen Needles 31g X 5 Mm Misc (Insulin Pen Needle) .... Use To Inject Lantus Insulin Once A Day and Novolog Insulin Three Times A Day As Instructed (250.00) 14)  Prodigy No Coding Blood Gluc  Strp (Glucose Blood) .... Use To Check Blood Sugar Twice Daily: Before Breakfast and  Bedtime( 250.00) 15)  Novolog Flexpen 100 Unit/ml Soln (Insulin Aspart) .... Inject 4 Units Before Each Meal- Take 10 Minutes Before Meal (After You Check Your Blood Sugar) 16)  Prodigy Twist Top Lancets 28g  Misc (Lancets) .... Use To Check Blood Sugar 4x A Day Before Each Meal and At Bedtime (250.00) 17)  Prodigy Autocode Blood Glucose  Devi (Blood Glucose Monitoring Suppl) .... Use To Check Blood Sugar 4x A Day: Before Each Meal and At Bedtime (250.00) 18)  Prevpac   Misc (Amoxicill-Clarithro-Lansopraz) .... Take As Directed  Allergies (verified): No Known Drug Allergies  Directives: 1)  Full Code   Past History:  Past medical, surgical, family and social histories (including risk factors) reviewed, and no changes noted (except as noted below).  Past Medical History: Reviewed history from 09/15/2008 and no changes required. DM, type 2 diagnosed 2001 GERD LBP r/t herniated disc -  chronic;s/p laminectomy in 2008 at [facility]. Patient states that he herniated his spine performing heavy lifting for 10 years at his job. Last MRI 04/2008.   Past Surgical History: Reviewed history from 09/12/2009 and no changes required. L4-5 spine Laminectomy 2008 Repair of Left Arm Fracture  Family History: Reviewed history from 09/15/2008 and no changes required. Unknown per patient  Social History: Reviewed history from 09/15/2008 and no changes required. Immigrated from [place] in 1990. 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  Review of Systems       The patient complains of anorexia and fever.  The patient denies weight loss, weight gain, hoarseness, chest pain, syncope, dyspnea on exertion, peripheral edema, hemoptysis, abdominal pain, melena, severe indigestion/heartburn, hematuria, muscle weakness, and suspicious skin lesions.    Physical Exam  General:   alert and well-developed.  well-nourished and well-hydrated.   Head:  no abnormalities observed.   Eyes:  vision grossly intact and no injection.   Ears:  R ear normal and L ear normal.   Nose:  nasal dischargemucosal pallor and deviated septum.   Mouth:  good dentition, no gingival abnormalities, and pharynx mildly injected. Neck:  supple, full ROM, and no masses.   Lungs:  Normal respiratory effort, chest expands symmetrically. Lungs are clear to auscultation, no crackles or wheezes. Heart:  normal rate and regular rhythm.   Abdomen:  soft, non-tender, and normal bowel sounds.   Msk:  No deformity or scoliosis noted of thoracic or lumbar spine.  Left UE with pain induced with elevation and internal rotation (sp ORIF) Pulses:  R radial normal, R dorsalis pedis normal, L radial normal, and L dorsalis pedis normal.  Extremities:  no cyanosis, clubbing or edema  Neurologic:  alert & oriented X3, cranial nerves II-XII intact, strength normal in all extremities, sensation intact to light touch, and DTRs symmetrical and normal.   Skin:  Well-healed scar to a dorsal surface of left forearm. Cervical Nodes:  no anterior cervical adenopathy and no posterior cervical adenopathy.   Psych:  Oriented X3, memory intact for recent and remote, normally interactive, good eye contact, and depressed affect.     Impression & Recommendations:  Problem # 1:  ARTHRALGIA (ICD-719.40) Unclear etiology: viral infection vs DJD vs RA? Will continue with Naprosyn, Tylenol and Tramadol on  as needed basis. Drink plenty of fluids; regular meals!!!!(given 30 day Ramadan fast). will follow up in 2-4 weeks. Orders: T-CBC No Diff ([id number]) T-Sed Rate (Automated) ([id number]) T-Rheumatoid Factor ([id number])  Problem # 2:  DIABETES-TYPE 2 (ICD-250.00) Will continue with DME. 20 discussion on DM and BG control in a setting of irregular meal intake. Patient verbalized understanding. Will check HgbA1C today. Continue  with current doses of Lantus and Novolog for now. His updated medication list for this problem includes:    Metformin Hcl 1000 Mg Tabs (Metformin hcl) .Marland Kitchen... Take 1 tablet by mouth two times a day    Lisinopril 10 Mg Tabs (Lisinopril) .Marland Kitchen... Take 1 tablet by mouth once a day    Lantus Solostar 100 Unit/ml Soln (Insulin glargine) ..... Inject 10 units under skin each night at bedtime    Novolog Flexpen 100 Unit/ml Soln (Insulin aspart) ..... Inject 4 units before each meal- take 10 minutes before meal (after you check your blood sugar)  Orders: T-Urinalysis Dipstick only ([id number]) T-Basic Metabolic Panel ([id number]) T-Hgb A1C (in-house) ([id number])  Labs Reviewed: Creat: 0.7 (09/13/2009)     Last Eye Exam: normal (09/15/2008) Reviewed HgBA1c results: 9.3 (09/11/2009)  12.4 (05/29/2009)  Problem # 3:  HYPERTENSION (ICD-401.9) Controlled; no change in managment. His updated medication list for this problem includes:    Lisinopril 10 Mg Tabs (Lisinopril) .Marland Kitchen... Take 1 tablet by mouth once a day  Orders: T-Basic Metabolic Panel [id number])  BP today: 129/84 Prior BP: 120/60 (09/13/2009)  Labs Reviewed: K+: 4.1 (09/13/2009) Creat: : 0.7 (09/13/2009)   Chol: 145 (06/01/2009)   HDL: 54 (06/01/2009)   LDL: 81 (06/01/2009)   TG: 49 (06/01/2009)  Problem # 4:  BACK PAIN, WITH RADIATION, UNSPEC. (ICD-724.4)  Will refer to a Child psychotherapist to assist with a disability application.  His updated medication list for this problem includes:    Tramadol Hcl 50 Mg Tabs (Tramadol hcl) ..... One tab as needed for back pain, up to four times a day    Naprosyn 500 Mg Tabs (Naproxen) .Marland Kitchen... Take 1 tablet by mouth two times a day with meals    Acetaminophen 325 Mg Tabs (Acetaminophen) .Marland Kitchen... Take 1 tablet by mouth four times a day as needed (take it together with tramadol)  Discussed use of moist heat or ice, modified activities, medications, and stretching/strengthening exercises. Back care instructions  given. To be seen in 2 weeks if no improvement; sooner if worsening of symptoms.   Orders: Physical Therapy Referral (PT)  Complete Medication List: 1)  Tramadol Hcl 50 Mg Tabs (Tramadol hcl) .... One tab as needed for back pain, up to four times a day 2)  Omeprazole 40 Mg Cpdr (Omeprazole) .... Take 1 tablet by mouth two times a day 3)  Naprosyn 500 Mg Tabs (Naproxen) .... Take 1 tablet by mouth two times a day with meals  4)  Gabapentin 300 Mg Caps (Gabapentin) .... Take 1 tablet by mouth once a day for 3 days then one tablet by mouth bid 5)  Metformin Hcl 1000 Mg Tabs (Metformin hcl) .... Take 1 tablet by mouth two times a day 6)  Prodigy Blood Glucose Monitor W/device Kit (Blood glucose monitoring suppl) .... Check your blood sugarbefore breakfast 7)  Prodigy Twist Top Lancets 28g Misc (Lancets) .... Check yoiur blood glucose before breakfast 8)  Exactech Test Strp (Glucose blood) .... Check your blood glucose before breakfast 9)  Lisinopril 10 Mg Tabs (Lisinopril) .... Take 1 tablet by mouth once a day 10)  Acetaminophen 325 Mg Tabs (Acetaminophen) .... Take 1 tablet by mouth four times a day as needed (take it together with tramadol) 11)  Truetrack Test Strp (Glucose blood) .... Use to test blood sugar  a one time day 12)  Lantus Solostar 100 Unit/ml Soln (Insulin glargine) .... Inject 10 units under skin each night at bedtime 13)  Prodigy Mini Pen Needles 31g X 5 Mm Misc (Insulin pen needle) .... Use to inject lantus insulin once a day and novolog insulin three times a day as instructed (250.00) 14)  Prodigy No Coding Blood Gluc Strp (Glucose blood) .... Use to check blood sugar twice daily: before breakfast and bedtime( 250.00) 15)  Novolog Flexpen 100 Unit/ml Soln (Insulin aspart) .... Inject 4 units before each meal- take 10 minutes before meal (after you check your blood sugar) 16)  Prodigy Twist Top Lancets 28g Misc (Lancets) .... Use to check blood sugar 4x a day before each meal and  at bedtime (250.00) 17)  Prodigy Autocode Blood Glucose Devi (Blood glucose monitoring suppl) .... Use to check blood sugar 4x a day: before each meal and at bedtime (250.00) 18)  Prevpac Misc (Amoxicill-clarithro-lansopraz) .... Take as directed  Other Orders: Social Work Referral (Social )  Patient Instructions: 1)  Please, eat regular meals as was discussed. 2)  Drink plenty of water and rest. 3)  Try to walk at least 30 minutes daily for an exercise. 4)  Gargle with warm salt water  4 times daily as needed to assisist with poor taste in your mouth. 5)  Call with any questions. 6)  Please, follow up in 4-8 weeks. Process Orders Check Orders Results:     [facility]: ABN not required for this insurance Tests Sent for requisitioning (November 16, 2009 4:22 PM):     11/16/2009: [facility] -- T-Basic Metabolic Panel [id number] (signed)     11/16/2009: [facility] -- T-CBC No Diff [[id number]] (signed)     11/16/2009: [facility] -- T-Sed Rate (Automated) [phone] (signed)     11/16/2009: [facility] -- T-Rheumatoid Factor [phone] (signed)     Prevention & Chronic Care Immunizations   Influenza vaccine: Fluvax 3+  (05/29/2009)   Influenza vaccine deferral: Deferred  (06/01/2009)   Influenza vaccine due: 05/29/2009    Tetanus booster: 05/29/2009: Tdap   Td booster deferral: Deferred  (06/01/2009)   Tetanus booster due: 05/30/2019    Pneumococcal vaccine: Not documented   Pneumococcal vaccine deferral: Deferred  (06/01/2009)  Colorectal Screening   Hemoccult: Not documented   Hemoccult action/deferral: Ordered  (05/29/2009)   Hemoccult due: 05/30/2010    Colonoscopy: DONE  (10/23/2009)   Colonoscopy action/deferral: GI referral  (05/29/2009)   Colonoscopy due: 10/2019  Other Screening   PSA: Not documented   PSA action/deferral: Discussed-PSA declined   (05/29/2009)   Smoking  status: never  (11/16/2009)  Diabetes Mellitus   HgbA1C: 7.2  (11/16/2009)    Eye exam: normal  (09/15/2008)   Eye exam due: 09/15/2009    Foot exam: yes  (09/11/2009)   Foot exam action/deferral: Do today   High risk foot: Yes  (05/29/2009)   Foot care education: Done  (05/29/2009)   Foot exam due: 09/15/2009    Urine microalbumin/creatinine ratio: 5.7  (06/01/2009)  Lipids   Total Cholesterol: 145  (06/01/2009)   Lipid panel action/deferral: Lipid Panel ordered   LDL: 81  (06/01/2009)   LDL Direct: Not documented   HDL: 54  (06/01/2009)   Triglycerides: 49  (06/01/2009)   Lipid panel due: 11/29/2009  Hypertension   Last Blood Pressure: 129 / 84  (11/16/2009)   Serum creatinine: 0.7  (09/13/2009)   Serum potassium 4.1  (09/13/2009)  Self-Management Support :   Personal Goals (by the next clinic visit) :     Personal A1C goal: 7  (05/29/2009)     Personal blood pressure goal: 130/80  (05/29/2009)     Personal LDL goal: 100  (05/29/2009)    Patient will work on the following items until the next clinic visit to reach self-care goals:     Medications and monitoring: take my medicines every day, check my blood sugar, bring all of my medications to every visit, examine my feet every day  (11/16/2009)     Eating: drink diet soda or water instead of juice or soda, eat foods that are low in salt, eat baked foods instead of fried foods  (11/16/2009)    Diabetes self-management support: Pre-printed educational material, Resources for patients handout, Written self-care plan  (09/11/2009)   Last diabetes self-management training by diabetes educator: 11/13/2009    Hypertension self-management support: Pre-printed educational material, Resources for patients handout, Written self-care plan  (09/11/2009)    Self-management comments: PATIENT DOES THE TREAD MILL AT HOME   Laboratory Results   Urine Tests  Date/Time Received: November 16, 2009 11:17  AM  Date/Time Reported: Burke Keels  November 16, 2009 11:17 AM   Routine Urinalysis   Color: yellow Appearance: Clear Glucose: negative   (Normal Range: Negative) Bilirubin: negative   (Normal Range: Negative) Ketone: negative   (Normal Range: Negative) Spec. Gravity: >=1.030   (Normal Range: 1.003-1.035) Blood: negative   (Normal Range: Negative) pH: 5.0   (Normal Range: 5.0-8.0) Protein: negative   (Normal Range: Negative) Urobilinogen: 0.2   (Normal Range: 0-1) Nitrite: negative   (Normal Range: Negative) Leukocyte Esterace: negative   (Normal Range: Negative)     Blood Tests   Date/Time Received: November 16, 2009 11:16 AM  Date/Time Reported: Burke Keels  November 16, 2009 11:16 AM   HGBA1C: 7.2%   (Normal Range: Non-Diabetic - 3-6%   Control Diabetic - 6-8%)

## 2010-04-16 NOTE — Assessment & Plan Note (Signed)
Summary: EST-2 MONTH F/U VISIT/CH   Vital Signs:  Patient profile:   55 year old male Height:      69 inches (175.26 cm) Weight:      148.6 pounds (67.55 kg) BMI:     22.02 Temp:     97.3 degrees F (36.28 degrees C) oral Pulse rate:   77 / minute BP sitting:   126 / 76  (right arm) Cuff size:   regular  Vitals Entered By: Cynda Familia Duncan Dull) (September 11, 2009 3:10 PM) CC: recurrent HAs, cant sleep, doesnt feel good Is Patient Diabetic? Yes Pain Assessment Patient in pain? yes     Location: Headache Intensity: 7 Type: sharp Nutritional Status BMI of > 30 = obese CBG Result 294  Does patient need assistance? Functional Status Self care Ambulation Normal   CC:  recurrent HAs, cant sleep, and doesnt feel good.  History of Present Illness: 1. F/u on DM. Takes all his meds as instructed. Denies any hypoglycemia. Had an appointment with Ms. Rhiley today. 2. C/o nasal congestion 2 days after sleeping with air conditioning on. Denies fever, chills, SOB, cough, nasal discharge abdominal pain or rash. Nobody else in the family is with similar Sx.  Preventive Screening-Counseling & Management  Alcohol-Tobacco     Alcohol drinks/day: 0     Smoking Status: never  Caffeine-Diet-Exercise     Does Patient Exercise: yes     Type of exercise: walking     Exercise (avg: min/session): 30-60     Times/week: 5     Exercise Counseling: not indicated; exercise is adequate  Medications Prior to Update: 1)  Tramadol Hcl 50 Mg Tabs (Tramadol Hcl) .... One Tab As Needed For Back Pain, Up To Four Times A Day 2)  Omeprazole 40 Mg Cpdr (Omeprazole) .... Take 1 Tablet By Mouth Two Times A Day 3)  Naprosyn 500 Mg Tabs (Naproxen) .... Take 1 Tablet By Mouth Two Times A Day With Meals 4)  Gabapentin 300 Mg Caps (Gabapentin) .... Take 1 Tablet By Mouth Once A Day For 3 Days Then One Tablet By Mouth Bid 5)  Glyburide 5 Mg Tabs (Glyburide) .... 2 Tabs Q Am 6)  Metformin Hcl 1000 Mg Tabs  (Metformin Hcl) .... Take 1 Tablet By Mouth Two Times A Day 7)  Prodigy Blood Glucose Monitor W/device Kit (Blood Glucose Monitoring Suppl) .... Check Your Blood Sugarbefore Breakfast 8)  Prodigy Twist Top Lancets 28g  Misc (Lancets) .... Check Yoiur Blood Glucose Before Breakfast 9)  Exactech Test  Strp (Glucose Blood) .... Check Your Blood Glucose Before Breakfast 10)  Lisinopril 10 Mg Tabs (Lisinopril) .... Take 1 Tablet By Mouth Once A Day 11)  Acetaminophen 325 Mg Tabs (Acetaminophen) .... Take 1 Tablet By Mouth Four Times A Day As Needed (Take It Together With Tramadol)  Current Medications (verified): 1)  Tramadol Hcl 50 Mg Tabs (Tramadol Hcl) .... One Tab As Needed For Back Pain, Up To Four Times A Day 2)  Omeprazole 40 Mg Cpdr (Omeprazole) .... Take 1 Tablet By Mouth Two Times A Day 3)  Naprosyn 500 Mg Tabs (Naproxen) .... Take 1 Tablet By Mouth Two Times A Day With Meals 4)  Gabapentin 300 Mg Caps (Gabapentin) .... Take 1 Tablet By Mouth Once A Day For 3 Days Then One Tablet By Mouth Bid 5)  Glyburide 5 Mg Tabs (Glyburide) .... 2 Tabs Q Am 6)  Metformin Hcl 1000 Mg Tabs (Metformin Hcl) .... Take 1 Tablet By Mouth Two  Times A Day 7)  Prodigy Blood Glucose Monitor W/device Kit (Blood Glucose Monitoring Suppl) .... Check Your Blood Sugarbefore Breakfast 8)  Prodigy Twist Top Lancets 28g  Misc (Lancets) .... Check Yoiur Blood Glucose Before Breakfast 9)  Exactech Test  Strp (Glucose Blood) .... Check Your Blood Glucose Before Breakfast 10)  Lisinopril 10 Mg Tabs (Lisinopril) .... Take 1 Tablet By Mouth Once A Day 11)  Acetaminophen 325 Mg Tabs (Acetaminophen) .... Take 1 Tablet By Mouth Four Times A Day As Needed (Take It Together With Tramadol)  Allergies (verified): No Known Drug Allergies  Past History:  Social History: Last updated: 09/15/2008 Immigrated from [place] in 1990. 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  Risk Factors: Smoking Status: never (09/11/2009)  Past medical, surgical, family and social histories (including risk factors) reviewed for relevance to current acute and chronic problems.  Past Medical History: Reviewed history from 09/15/2008 and no changes required. DM, type 2 diagnosed 2001 GERD LBP r/t herniated disc -  chronic;s/p laminectomy in 2008 at [facility]. Patient states that he herniated his spine performing heavy lifting for 10 years at his job. Last MRI 04/2008.   Past Surgical History: Reviewed history from 09/15/2008 and no changes required. L4-5 spine Laminectomy 2008  Family History: Reviewed history from 09/15/2008 and no changes required. Unknown per patient  Social History: Reviewed history from 09/15/2008 and no changes required. Immigrated from [place] in 1990. 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  well-nourished and well-hydrated.   Head:  normocephalic and atraumatic.   Eyes:  vision grossly intact, pupils equal, pupils round, and pupils reactive to light.   Ears:  R ear normal and L ear normal.   Nose:  no external deformity and nasal discharge, mucosal pallor.   Mouth:  no gingival abnormalities and fair dentition.  pharynx pink and moist.   Neck:  supple, full ROM, and no masses.   Lungs:  Normal respiratory effort, chest expands symmetrically. Lungs are clear to auscultation, no crackles or wheezes. Heart:  normal rate and regular rhythm.   Abdomen:  Bowel sounds positive,abdomen soft and non-tender without masses, organomegaly or hernias noted. Msk:  No deformity or scoliosis noted of thoracic or lumbar spine.   Pulses:  normal  peripheral pulses  Extremities:  no cyanosis, clubbing or edema   Diabetes Management Exam:    Foot Exam (with socks and/or shoes not present):       Sensory-Pinprick/Light touch:          Left medial foot (L-4): normal          Left dorsal foot (L-5): normal          Left lateral foot (S-1): normal          Right medial foot (L-4): normal          Right dorsal foot (L-5): normal          Right lateral foot (S-1): normal       Sensory-Monofilament:          Left foot: normal          Right foot: normal  Inspection:          Left foot: normal          Right foot: normal       Nails:          Left foot: normal          Right foot: normal   Impression & Recommendations:  Problem # 1:  DIABETES-TYPE 2 (ICD-250.00)  Improvement in in control. Continues to have BG runing between 300 and 380. Will start Novolin. Will D/C glyburide next visit. Use of Novolin insulin taught; patient demonstrated proper insulin injection techniques. Instructed to follow CBG's. Will follow up in 3 months or sooner. His updated medication list for this problem includes:    Glyburide 5 Mg Tabs (Glyburide) .Marland Kitchen... 2 tabs q am    Metformin Hcl 1000 Mg Tabs (Metformin hcl) .Marland Kitchen... Take 1 tablet by mouth two times a day    Lisinopril 10 Mg Tabs (Lisinopril) .Marland Kitchen... Take 1 tablet by mouth once a day    Novolin N 100 Unit/ml Susp (Insulin isophane human) ..... Inject 6 units subcutaneously three times a day ac  Orders: T-Hgb A1C (in-house) (74259DG) T- Capillary Blood Glucose (38756)  Labs Reviewed: Creat: 0.91 (06/01/2009)     Last Eye Exam: normal (09/15/2008) Reviewed HgBA1c results: 9.3 (09/11/2009)  12.4 (05/29/2009)  Problem # 2:  HYPERTENSION (ICD-401.9) Controlled. His updated medication list for this problem includes:    Lisinopril 10 Mg Tabs (Lisinopril) .Marland Kitchen... Take 1 tablet by mouth once a day  BP today: 126/76 Prior BP: 146/85 (06/19/2009)  Labs Reviewed: K+: 4.3 (06/01/2009) Creat: :  0.91 (06/01/2009)   Chol: 145 (06/01/2009)   HDL: 54 (06/01/2009)   LDL: 81 (06/01/2009)   TG: 49 (06/01/2009)  Problem # 3:  RHINITIS (ICD-472.0) Saline nasal washes three times a day as needed. Avoid exposure to cold temperatures (i.e. air conditioning). Hydration, rest. Instructed to call if Sx worsen or do not improve within 7 days.  Complete Medication List: 1)  Tramadol Hcl 50 Mg Tabs (Tramadol hcl) .... One tab as needed for back pain, up to four times a day 2)  Omeprazole 40 Mg Cpdr (Omeprazole) .... Take 1 tablet by mouth two times a day 3)  Naprosyn 500 Mg Tabs (Naproxen) .... Take 1 tablet by mouth two times a day with meals 4)  Gabapentin 300 Mg Caps (Gabapentin) .... Take 1 tablet by mouth once a day for 3 days then one tablet by mouth bid 5)  Glyburide 5 Mg Tabs (Glyburide) .... 2 tabs q am 6)  Metformin Hcl 1000 Mg Tabs (Metformin hcl) .... Take 1 tablet by mouth two times a day 7)  Prodigy Blood Glucose Monitor W/device Kit (Blood glucose monitoring suppl) .... Check your blood sugarbefore breakfast 8)  Prodigy Twist Top Lancets 28g Misc (Lancets) .... Check yoiur blood glucose before breakfast 9)  Exactech Test Strp (Glucose blood) .... Check your blood glucose before breakfast 10)  Lisinopril 10 Mg Tabs (Lisinopril) .... Take 1 tablet by mouth once a day 11)  Acetaminophen 325 Mg Tabs (Acetaminophen) .... Take 1 tablet by mouth four times a day as needed (take it together with tramadol) 12)  Truetrack Test Strp (Glucose blood) .... Use to test blood sugar  a one time day 13)  Novolin N 100 Unit/ml Susp (Insulin isophane human) .... Inject 6 units subcutaneously three times a day ac  Patient Instructions: 1)  Please, call with any questions. 2)  Follow your daily Blood sugar  levels as instructed. 3)  Follow up in 3 months or sooner. Prescriptions: NOVOLIN N 100 UNIT/ML SUSP (INSULIN ISOPHANE HUMAN) inject 6 units Subcutaneously three times a day ac  #100 x 11   Entered  and Authorized by:   Deatra Robinson MD   Signed by:   Deatra Robinson MD on 09/11/2009   Method used:   Faxed to ...       [facility] [address] (retail)       [phone]       [place], [place]  [address]       Ph: [phone]       Fax: [phone]   RxID:   [id number] BD DISP NEEDLES 25G X 5/8" MISC (NEEDLE (DISP)) use this novolog pens three times a day ac as prescribed  #1 x 11   Entered and Authorized by:   Deatra Robinson MD   Signed by:   Deatra Robinson MD on 09/11/2009   Method used:   Electronically to        [facility] [address] (retail)       [phone]       [place], [place]  [address]       Ph: [phone]       Fax: [phone]   RxID:   [id number] NOVOLOG PENFILL 100 UNIT/ML SOLN (INSULIN ASPART) inject 6 units Subcutaneously three times a day before meals  #1 x 11   Entered and Authorized by:   Deatra Robinson MD   Signed by:   Deatra Robinson MD on 09/11/2009   Method used:   Electronically to        [facility] [address] (retail)       [phone]       [place], [place]  [address]       Ph: [phone]       Fax: [phone]   RxID:   [id number] BD DISP NEEDLES 25G X 5/8" MISC (NEEDLE (DISP)) use this novolog pens three times a day ac as prescribed  #1 x 11   Entered and Authorized by:   Deatra Robinson MD   Signed by:   Deatra Robinson MD on 09/11/2009   Method used:   Electronically to        [facility] [address] (retail)       [phone]       [place], [place]  [address]       Ph: [phone]       Fax: [phone]   RxID:   [id number] NOVOLOG PENFILL 100 UNIT/ML SOLN (INSULIN ASPART) inject 6 units Subcutaneously three times a day before meals  #1 x 11   Entered and Authorized by:   Deatra Robinson MD   Signed by:   Deatra Robinson MD on 09/11/2009   Method used:    Electronically to        [facility] [address] (retail)       [phone]       [place], [place]  [address]       Ph: [phone]       Fax: [phone]   RxID:   [id number]   Laboratory Results   Blood Tests   Date/Time Received: September 11, 2009 2:50 PM Date/Time Reported: Alric Quan  September 11, 2009 2:50 PM   HGBA1C: 9.3%   (Normal Range: Non-Diabetic - 3-6%   Control Diabetic - 6-8%) CBG Random:: 294mg /dL      Prevention & Chronic Care Immunizations   Influenza vaccine: Fluvax 3+  (05/29/2009)   Influenza vaccine deferral: Deferred  (06/01/2009)   Influenza vaccine due: 05/29/2009    Tetanus booster: 05/29/2009: Tdap   Td booster deferral: Deferred  (06/01/2009)   Tetanus booster due: 05/30/2019    Pneumococcal vaccine: Not documented   Pneumococcal vaccine deferral: Deferred  (06/01/2009)  Colorectal Screening   Hemoccult: Not documented   Hemoccult action/deferral: Ordered  (05/29/2009)   Hemoccult due: 05/30/2010    Colonoscopy: Not documented   Colonoscopy action/deferral: GI referral  (05/29/2009)  Other Screening   PSA: Not documented   PSA action/deferral: Discussed-PSA declined  (05/29/2009)   Smoking status: never  (09/11/2009)  Diabetes Mellitus   HgbA1C: 9.3  (09/11/2009)    Eye exam: normal  (09/15/2008)   Eye exam due: 09/15/2009    Foot exam: yes  (09/11/2009)   Foot exam action/deferral: Do today   High risk foot: Yes  (05/29/2009)   Foot care education: Done  (05/29/2009)   Foot exam due: 09/15/2009    Urine microalbumin/creatinine ratio: 5.7  (06/01/2009)  Lipids   Total Cholesterol: 145  (06/01/2009)   Lipid panel action/deferral: Lipid Panel ordered   LDL: 81  (06/01/2009)   LDL Direct: Not documented   HDL: 54  (06/01/2009)   Triglycerides: 49  (06/01/2009)   Lipid panel due: 11/29/2009  Hypertension   Last Blood Pressure: 126 / 76  (09/11/2009)   Serum creatinine: 0.91   (06/01/2009)   Serum potassium 4.3  (06/01/2009)  Self-Management Support :   Personal Goals (by the next clinic visit) :     Personal A1C goal: 7  (05/29/2009)     Personal blood pressure goal: 130/80  (05/29/2009)     Personal LDL goal: 100  (05/29/2009)    Patient will work on the following items until the next clinic visit to reach self-care goals:     Medications and monitoring: take my medicines every day  (09/11/2009)     Eating: drink diet soda or water instead of juice or soda, eat foods that are low in salt, eat baked foods instead of fried foods  (09/11/2009)    Diabetes self-management support: Pre-printed educational material, Resources for patients handout, Written self-care plan  (09/11/2009)   Diabetes care plan printed   Last diabetes self-management training by diabetes educator: 07/09/2009    Hypertension self-management support: Pre-printed educational material, Resources for patients handout, Written self-care plan  (09/11/2009)   Hypertension self-care plan printed.      Resource handout printed.

## 2010-04-16 NOTE — Progress Notes (Signed)
Summary: med refill/gp  Phone Note Refill Request Message from:  Fax from Pharmacy on December 10, 2009 10:49 AM  Refills Requested: Medication #1:  METFORMIN HCL 1000 MG TABS Take 1 tablet by mouth two times a day Last appt. Sept.2,2011.   Method Requested: Electronic Initial call taken by: Chinita Pester RN,  December 10, 2009 10:50 AM    Prescriptions: METFORMIN HCL 1000 MG TABS (METFORMIN HCL) Take 1 tablet by mouth two times a day  #180 x 3   Entered and Authorized by:   Zoila Shutter MD   Signed by:   Zoila Shutter MD on 12/10/2009   Method used:   Electronically to        [facility] [id number] (retail)       [address] [place], [place]  [address]       Ph: [phone]       Fax: [phone]   RxID:   [id number]

## 2010-04-16 NOTE — Letter (Signed)
Summary: BLOOD GLUCOSE REPORT 08/31-09/04/2009  BLOOD GLUCOSE REPORT 08/31-09/04/2009   Imported By: Shon Hough 12/03/2009 16:09:22  _____________________________________________________________________  External Attachment:    Type:   Image     Comment:   External Document

## 2010-04-16 NOTE — Progress Notes (Signed)
Summary: diabetes support/dmr  Phone Note Call from Patient Call back at [facility] Phone [phone]   Summary of Call: patient called and left message that blood sugars are better taking insulin 10 units: 120, 130, 112, 140s and then 173, 175 yesterday and today- he is not sure if he forgot medicine or ate wrong food . He has an appointment with CDE this Friday. Told him to continue to check and be sure he takes all his medicine and we can reevaluate need for more insulin on Friday.  Initial call taken by: Jamison Neighbor RD,CDE,  September 24, 2009 3:36 PM

## 2010-04-16 NOTE — Progress Notes (Signed)
Summary: new insulin and diabetes supply orders/dmr  Phone Note Outgoing Call   Call placed by: Jamison Neighbor RD,CDE,  September 13, 2009 12:09 PM Summary of Call: asked by triage to asist with unsual syringe prescription, discussed wiht doctor Denton Meek- she'd like the insulin changed to lantus solostar with short pen needles. Start low dose insulin since patient still on Glyburide and metformin- may be able to discontinue glyburide once lantus increased to fasting blood sugar of 100 mg/dl  Follow-up for Phone Call        Noted that pateint was given 2 Novolog sample insulin pensat visit this week. Discussed this with attending physician. New order received to call patient and have him stop Novolog and start Lantus 10 units a day. Called patient: he repeated order to stop Novolog and start new insulin that he will go pick up today- 10 units once a day in evening and continue his oral meidicnes as he has been doing Per patient he started Novolog yesterday: 6 units before meals with metformin and glyburide (CBgs have been: 113- 110-130-110, (unsure of times of these CBGs)  . Told him we'd call him next week with a follow-up appointment Follow-up by: Jamison Neighbor RD,CDE,  September 14, 2009 4:34 PM  Additional Follow-up for Phone Call Additional follow up Details #1::        Provider notified, Patient called Additional Follow-up by: Deatra Robinson MD,  September 15, 2009 8:14 PM    New/Updated Medications: LANTUS SOLOSTAR 100 UNIT/ML SOLN (INSULIN GLARGINE) Inject 10 units under skin each night at bedtime PRODIGY MINI PEN NEEDLES 31G X 5 MM MISC (INSULIN PEN NEEDLE) use to inject lantus insulin one time a day (250.00) PRODIGY NO CODING BLOOD GLUC  STRP (GLUCOSE BLOOD) use to check blood sugar twice daily: before breakfast and bedtime( 250.00) Prescriptions: PRODIGY NO CODING BLOOD GLUC  STRP (GLUCOSE BLOOD) use to check blood sugar twice daily: before breakfast and bedtime( 250.00)  #100 x 10   Entered by:    Julaine Fusi  DO   Authorized by:   Deatra Robinson MD   Signed by:   Julaine Fusi  DO on 09/14/2009   Method used:   Electronically to        [facility] [address] (retail)       [phone]       [place], [place]  [address]       Ph: [phone]       Fax: [phone]   RxID:   [id number] PRODIGY MINI PEN NEEDLES 31G X 5 MM MISC (INSULIN PEN NEEDLE) use to inject lantus insulin one time a day (250.00)  #100 x 4   Entered by:   Julaine Fusi  DO   Authorized by:   Deatra Robinson MD   Signed by:   Julaine Fusi  DO on 09/14/2009   Method used:   Electronically to        [facility] [address] (retail)       [phone]       [place], [place]  [address]       Ph: [phone]       Fax: [phone]   RxID:   [id number] LANTUS SOLOSTAR 100 UNIT/ML SOLN (INSULIN GLARGINE) Inject 10 units under skin each night at bedtime  #1 box x 2   Entered by:   Julaine Fusi  DO  Authorized by:   Deatra Robinson MD   Signed by:   Julaine Fusi  DO on 09/14/2009   Method used:   Electronically to        [facility] [address] (retail)       [phone]       [place], [place]  [address]       Ph: [phone]       Fax: [phone]   RxID:   [id number]

## 2010-04-16 NOTE — Procedures (Signed)
Summary: Upper Endoscopy  Patient: Mitchell Page Note: All result statuses are Final unless otherwise noted.  Tests: (1) Upper Endoscopy (EGD)   EGD Upper Endoscopy       DONE     [facility]     [address]     [place], [place]  [address]           ENDOSCOPY PROCEDURE REPORT           PATIENT:  [name], [name]  MR#:  [id number]     BIRTHDATE:  [birth year], 54 yrs. old  GENDER:  male           ENDOSCOPIST:  Vania Rea. Jarold Motto, MD, [facility]     Referred by:           PROCEDURE DATE:  10/23/2009     PROCEDURE:  EGD with biopsy     ASA CLASS:  Class II     INDICATIONS:  GERD, dysphagia           MEDICATIONS:   There was residual sedation effect present from     prior procedure., Fentanyl 25 mcg IV, Versed 2 mg     TOPICAL ANESTHETIC:  Exactacain Spray           DESCRIPTION OF PROCEDURE:   After the risks benefits and     alternatives of the procedure were thoroughly explained, informed     consent was obtained.  The LB [id number] endoscope was     introduced through the mouth and advanced to the second portion of     the duodenum, without limitations.  The instrument was slowly     withdrawn as the mucosa was fully examined.     <<PROCEDUREIMAGES>>           Esophagitis was found. BIOPSIES DONE.EROSIONS NOTED.  Mild     gastritis was found in the total stomach. CLO AND REGULAR     BIOPSIES.  Normal duodenal folds were noted. BIOPSIES DONE.     Retroflexed views revealed no masses.    The scope was then withdrawn     from the patient and the procedure completed.           COMPLICATIONS:  None           ENDOSCOPIC IMPRESSION:     1) Esophagitis     2) Mild gastritis in the total stomach     3) Normal duodenal folds     4) No masses     AWAIT BIOPIES.     RECOMMENDATIONS:     1) Await biopsy results     2) Rx CLO if positive     3) continue PPI           REPEAT EXAM:  No           ______________________________     Vania Rea. Jarold Motto, MD, [facility]        CC:           n.     eSIGNED:   Vania Rea. Patterson at 10/23/2009 10:41 AM           Dossie Der, [id number]  Note: An exclamation mark (!) indicates a result that was not dispersed into the flowsheet. Document Creation Date: 10/23/2009 10:43 AM _______________________________________________________________________  (1) Order result status: Final Collection or observation date-time: 10/23/2009 10:22 Requested date-time:  Receipt date-time:  Reported date-time:  Referring Physician:   Ordering Physician: Sheryn Bison [phone]) Specimen Source:  Source: EndoProS  Filler Order Number: [id number] Lab site:

## 2010-04-16 NOTE — Assessment & Plan Note (Signed)
Summary: FU/SB.   Vital Signs:  Patient profile:   55 year old male Height:      69 inches Weight:      151.03 pounds BMI:     22.38 Temp:     97 degrees F oral Pulse rate:   97 / minute Pulse (ortho):   100 / minute BP sitting:   141 / 95  (right arm) BP standing:   139 / 91 Cuff size:   regular  Vitals Entered By: Angelina Ok RN (January 22, 2010 11:02 AM) CC: Depression Is Patient Diabetic? Yes Did you bring your meter with you today? Yes Pain Assessment Patient in pain? yes     Location: head Intensity: 3 Type: aching Onset of pain  Constant Nutritional Status BMI of 19 -24 = normal CBG Result 138  Have you ever been in a relationship where you felt threatened, hurt or afraid?No   Does patient need assistance? Functional Status Self care Ambulation Normal Comments Wakes with headache.  Feeling tired.  Needs refills.  Going out of [employer] for 1 month.   Diabetic Foot Exam Foot Inspection Is there a history of a foot ulcer?              No Is there a foot ulcer now?              No Is there swelling or an abnormal foot shape?          No Are the toenails long?                No Are the toenails thick?                No Are the toenails ingrown?              No Is there heavy callous build-up?              No Is there pain in the calf muscle (Intermittent claudication) when walking?    NoIs there a claw toe deformity?              No Is there elevated skin temperature?            No Is there limited ankle dorsiflexion?            No Is there foot or ankle muscle weakness?            No  Diabetic Foot Care Education Patient educated on appropriate care of diabetic feet.  Pulse Check          Right Foot          Left Foot Posterior Tibial:        normal            normal Dorsalis Pedis:        normal            normal  High Risk Feet? No   10-g (5.07) Semmes-Weinstein Monofilament Test Performed by: Angelina Ok RN          Right Foot          Left  Foot Visual Inspection                 Primary Care Provider:  Deatra Robinson, MD   CC:  Depression.  History of Present Illness: Pt is a 55 yo AAF with PMH of DM, HTN who came here for regular f/u and wants refill. He is going to  leave the country [place] and wants to refill his meds. He also complaints of muscle pain in both arms, legs and back. Has not done much exercise. Sometimes dizziness, No CP, SOB, or fever. Also has chronic HA. CBG runs about 120s. Good appetite, no melena or dysuria. No smoking, ETOH or drug abuse.    Depression History:      The patient denies a depressed mood most of the day and a diminished interest in his usual daily activities.         Preventive Screening-Counseling & Management  Alcohol-Tobacco     Alcohol drinks/day: 0     Smoking Status: never  Problems Prior to Update: 1)  Arthralgia  (ICD-719.40) 2)  Headache, Chronic  (ICD-784.0) 3)  Rhinitis  (ICD-472.0) 4)  Diabetes-type 2  (ICD-250.00) 5)  Hypertension  (ICD-401.9) 6)  Back Pain, With Radiation, Unspec.  (ICD-724.4) 7)  Walking Difficulty  (ICD-719.7) 8)  Gerd  (ICD-530.81) 9)  Constipation  (ICD-564.00) 10)  Preventive Health Care  (ICD-V70.0)  Medications Prior to Update: 1)  Tramadol Hcl 50 Mg Tabs (Tramadol Hcl) .... One Tab As Needed For Back Pain, Up To Four Times A Day 2)  Omeprazole 40 Mg Cpdr (Omeprazole) .... Take 1 Tablet By Mouth Two Times A Day 3)  Naprosyn 500 Mg Tabs (Naproxen) .... Take 1 Tablet By Mouth Two Times A Day With Meals 4)  Gabapentin 300 Mg Caps (Gabapentin) .... Take 1 Tablet By Mouth Once A Day For 3 Days Then One Tablet By Mouth Bid 5)  Metformin Hcl 1000 Mg Tabs (Metformin Hcl) .... Take 1 Tablet By Mouth Two Times A Day 6)  Prodigy Blood Glucose Monitor W/device Kit (Blood Glucose Monitoring Suppl) .... Check Your Blood Sugarbefore Breakfast 7)  Prodigy Twist Top Lancets 28g  Misc (Lancets) .... Check Yoiur Blood Glucose Before Breakfast 8)   Exactech Test  Strp (Glucose Blood) .... Check Your Blood Glucose Before Breakfast 9)  Lisinopril 10 Mg Tabs (Lisinopril) .... Take 1 Tablet By Mouth Once A Day 10)  Acetaminophen 325 Mg Tabs (Acetaminophen) .... Take 1 Tablet By Mouth Four Times A Day As Needed (Take It Together With Tramadol) 11)  Truetrack Test  Strp (Glucose Blood) .... Use To Test Blood Sugar  A One Time Day 12)  Lantus Solostar 100 Unit/ml Soln (Insulin Glargine) .... Inject 10 Units Under Skin Each Night At Bedtime 13)  Prodigy Mini Pen Needles 31g X 5 Mm Misc (Insulin Pen Needle) .... Use To Inject Lantus Insulin Once A Day and Novolog Insulin Three Times A Day As Instructed (250.00) 14)  Prodigy No Coding Blood Gluc  Strp (Glucose Blood) .... Use To Check Blood Sugar Twice Daily: Before Breakfast and Bedtime( 250.00) 15)  Novolog Flexpen 100 Unit/ml Soln (Insulin Aspart) .... Inject 4 Units Before Each Meal- Take 10 Minutes Before Meal (After You Check Your Blood Sugar) 16)  Prodigy Twist Top Lancets 28g  Misc (Lancets) .... Use To Check Blood Sugar 4x A Day Before Each Meal and At Bedtime (250.00) 17)  Prodigy Autocode Blood Glucose  Devi (Blood Glucose Monitoring Suppl) .... Use To Check Blood Sugar 4x A Day: Before Each Meal and At Bedtime (250.00) 18)  Prevpac   Misc (Amoxicill-Clarithro-Lansopraz) .... Take As Directed  Current Medications (verified): 1)  Tramadol Hcl 50 Mg Tabs (Tramadol Hcl) .... One Tab As Needed For Back Pain, Up To Four Times A Day 2)  Omeprazole 40 Mg Cpdr (Omeprazole) .... Take 1  Tablet By Mouth Two Times A Day 3)  Naprosyn 500 Mg Tabs (Naproxen) .... Take 1 Tablet By Mouth Two Times A Day With Meals 4)  Gabapentin 300 Mg Caps (Gabapentin) .... Take 1 Tablet By Mouth Once A Day For 3 Days Then One Tablet By Mouth Bid 5)  Metformin Hcl 1000 Mg Tabs (Metformin Hcl) .... Take 1 Tablet By Mouth Two Times A Day 6)  Prodigy Blood Glucose Monitor W/device Kit (Blood Glucose Monitoring Suppl) ....  Check Your Blood Sugarbefore Breakfast 7)  Prodigy Twist Top Lancets 28g  Misc (Lancets) .... Check Yoiur Blood Glucose Before Breakfast 8)  Exactech Test  Strp (Glucose Blood) .... Check Your Blood Glucose Before Breakfast 9)  Lisinopril 10 Mg Tabs (Lisinopril) .... Take 1 Tablet By Mouth Once A Day 10)  Acetaminophen 325 Mg Tabs (Acetaminophen) .... Take 1 Tablet By Mouth Four Times A Day As Needed (Take It Together With Tramadol) 11)  Truetrack Test  Strp (Glucose Blood) .... Use To Test Blood Sugar  A One Time Day 12)  Lantus Solostar 100 Unit/ml Soln (Insulin Glargine) .... Inject 10 Units Under Skin Each Night At Bedtime 13)  Prodigy Mini Pen Needles 31g X 5 Mm Misc (Insulin Pen Needle) .... Use To Inject Lantus Insulin Once A Day and Novolog Insulin Three Times A Day As Instructed (250.00) 14)  Prodigy No Coding Blood Gluc  Strp (Glucose Blood) .... Use To Check Blood Sugar Twice Daily: Before Breakfast and Bedtime( 250.00) 15)  Novolog Flexpen 100 Unit/ml Soln (Insulin Aspart) .... Inject 4 Units Before Each Meal- Take 10 Minutes Before Meal (After You Check Your Blood Sugar) 16)  Prodigy Twist Top Lancets 28g  Misc (Lancets) .... Use To Check Blood Sugar 4x A Day Before Each Meal and At Bedtime (250.00) 17)  Prodigy Autocode Blood Glucose  Devi (Blood Glucose Monitoring Suppl) .... Use To Check Blood Sugar 4x A Day: Before Each Meal and At Bedtime (250.00)  Allergies (verified): No Known Drug Allergies  Past History:  Past Medical History: Last updated: 09/15/2008 DM, type 2 diagnosed 2001 GERD LBP r/t herniated disc -  chronic;s/p laminectomy in 2008 at [facility]. Patient states that he herniated his spine performing heavy lifting for 10 years at his job. Last MRI 04/2008.   Family History: Last updated: 09/15/2008 Unknown per patient  Social History: Last updated: 09/15/2008 Immigrated from [place] in 1990. 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  Risk Factors: Smoking Status: never (01/22/2010)  Family History: Reviewed history from 09/15/2008 and no changes required. Unknown per patient  Social History: Reviewed history from 09/15/2008 and no changes required. Immigrated from [place] in 1990. 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  Review of Systems  The patient denies fever, chest pain, syncope, dyspnea on exertion, peripheral edema, prolonged cough, hemoptysis, abdominal pain, melena, and hematochezia.    Physical Exam  General:  alert, well-developed, well-nourished, and well-hydrated.   Nose:  no nasal discharge.   Mouth:  pharynx pink and moist.   Neck:  supple.   Lungs:  normal respiratory effort, normal breath sounds, no crackles, and no wheezes.   Heart:  normal rate, regular rhythm, no murmur, and no JVD.   Abdomen:  soft, non-tender, normal bowel sounds, no distention, and no masses.  Msk:  Muscle tenderness on his bilateral arm, leg and back. no joint swelling, no joint warmth, no redness over joints, and no joint deformities.   Pulses:  2+ Extremities:  No edema.  Neurologic:  alert & oriented X3, cranial nerves II-XII intact, strength normal in all extremities, sensation intact to light touch, and gait normal.    Diabetes Management Exam:    Foot Exam (with socks and/or shoes not present):       Sensory-Monofilament:          Left foot: normal          Right foot: normal   Impression & Recommendations:  Problem # 1:  DIABETES-TYPE 2 (ICD-250.00) His DM well controlled with CBg 120s. Will continue current regien. Will have eye refferal for annual exam. His updated medication list for this problem includes:    Metformin Hcl 1000 Mg Tabs (Metformin hcl) .Marland Kitchen... Take 1 tablet by  mouth two times a day    Lisinopril 10 Mg Tabs (Lisinopril) .Marland Kitchen... Take 1 tablet by mouth once a day    Lantus Solostar 100 Unit/ml Soln (Insulin glargine) ..... Inject 10 units under skin each night at bedtime    Novolog Flexpen 100 Unit/ml Soln (Insulin aspart) ..... Inject 4 units before each meal- take 10 minutes before meal (after you check your blood sugar)  Orders: Ophthalmology Referral (Ophthalmology)  Labs Reviewed: Creat: 0.83 (11/16/2009)     Last Eye Exam: normal (09/15/2008) Reviewed HgBA1c results: 7.2 (11/16/2009)  9.3 (09/11/2009)  Problem # 2:  HYPERTENSION (ICD-401.9)  His updated medication list for this problem includes:    Lisinopril 10 Mg Tabs (Lisinopril) .Marland Kitchen... Take 1 tablet by mouth once a day  BP today: 141/95 Prior BP: 129/84 (11/16/2009)  Labs Reviewed: K+: 4.3 (11/16/2009) Creat: : 0.83 (11/16/2009)   Chol: 145 (06/01/2009)   HDL: 54 (06/01/2009)   LDL: 81 (06/01/2009)   TG: 49 (06/01/2009)  Problem # 3:  ARTHRALGIA (ICD-719.40) Assessment: Unchanged Will give flexeril for his muscle pain. this may be due to fibromyalgia. Also advise exercise.   Complete Medication List: 1)  Tramadol Hcl 50 Mg Tabs (Tramadol hcl) .... One tab as needed for back pain, up to four times a day 2)  Omeprazole 40 Mg Cpdr (Omeprazole) .... Take 1 tablet by mouth two times a day 3)  Naprosyn 500 Mg Tabs (Naproxen) .... Take 1 tablet by mouth two times a day with meals 4)  Gabapentin 300 Mg Caps (Gabapentin) .... Take 1 tablet by mouth once a day for 3 days then one tablet by mouth bid 5)  Metformin Hcl 1000 Mg Tabs (Metformin hcl) .... Take 1 tablet by mouth two times a day 6)  Prodigy Blood Glucose Monitor W/device Kit (Blood glucose monitoring suppl) .... Check your blood sugarbefore breakfast 7)  Prodigy Twist Top Lancets 28g Misc (Lancets) .... Check yoiur blood glucose before breakfast 8)  Exactech Test Strp (Glucose blood) .... Check your blood glucose before  breakfast 9)  Lisinopril 10 Mg Tabs (Lisinopril) .... Take 1 tablet by mouth once a day 10)  Acetaminophen 325 Mg Tabs (Acetaminophen) .... Take 1 tablet by mouth four times a day as needed (take it together with tramadol) 11)  Truetrack Test Strp (Glucose blood) .... Use to test blood sugar  a one time day 12)  Lantus Solostar 100 Unit/ml Soln (Insulin glargine) .... Inject 10 units under skin each night at bedtime 13)  Prodigy Mini Pen Needles 31g X 5 Mm Misc (  Insulin pen needle) .... Use to inject lantus insulin once a day and novolog insulin three times a day as instructed (250.00) 14)  Prodigy No Coding Blood Gluc Strp (Glucose blood) .... Use to check blood sugar twice daily: before breakfast and bedtime( 250.00) 15)  Novolog Flexpen 100 Unit/ml Soln (Insulin aspart) .... Inject 4 units before each meal- take 10 minutes before meal (after you check your blood sugar) 16)  Prodigy Twist Top Lancets 28g Misc (Lancets) .... Use to check blood sugar 4x a day before each meal and at bedtime (250.00) 17)  Prodigy Autocode Blood Glucose Devi (Blood glucose monitoring suppl) .... Use to check blood sugar 4x a day: before each meal and at bedtime (250.00) 18)  Flexeril 5 Mg Tabs (Cyclobenzaprine hcl) .... Take 1 tablet by mouth three times a day as needed for muscle pain  Other Orders: Capillary Blood Glucose/CBG (24401) Capillary Blood Glucose/CBG (02725)  Patient Instructions: 1)  Please schedule a follow-up appointment in 4 months. 2)  It is important that you exercise regularly at least 20 minutes 5 times a week. If you develop chest pain, have severe difficulty breathing, or feel very tired , stop exercising immediately and seek medical attention. Prescriptions: NOVOLOG FLEXPEN 100 UNIT/ML SOLN (INSULIN ASPART) inject 4 units before each meal- take 10 minutes before meal (after you check your blood sugar)  #1 box x 2   Entered and Authorized by:   Jackson Latino MD   Signed by:   Jackson Latino MD on 01/22/2010   Method used:   Electronically to        [facility] [address] (retail)       [address] [place], [place]  [address]       Ph: [phone]       Fax: [phone]   RxID:   [id number] LANTUS SOLOSTAR 100 UNIT/ML SOLN (INSULIN GLARGINE) Inject 10 units under skin each night at bedtime  #1 box x 2   Entered and Authorized by:   Jackson Latino MD   Signed by:   Jackson Latino MD on 01/22/2010   Method used:   Electronically to        [facility] [address] (retail)       [address] [place], [place]  [address]       Ph: [phone]       Fax: [phone]   RxID:   [id number] METFORMIN HCL 1000 MG TABS (METFORMIN HCL) Take 1 tablet by mouth two times a day  #180 x 3   Entered and Authorized by:   Jackson Latino MD   Signed by:   Jackson Latino MD on 01/22/2010   Method used:   Electronically to        [facility] [address] (retail)       [address] [place], [place]  [address]       Ph: [phone]       Fax: [phone]   RxID:   [id number] LISINOPRIL 10 MG TABS (LISINOPRIL) Take 1 tablet by mouth once a day  #30 x 5   Entered and Authorized by:   Jackson Latino MD   Signed by:   Jackson Latino MD  on 01/22/2010   Method used:   Electronically to        [facility] [address] (retail)       [address] [place], [place]  [address]       Ph: [phone]       Fax: [phone]   RxID:   [id number] NAPROSYN 500 MG TABS (NAPROXEN) Take 1 tablet by mouth two times a day with meals  #60 x 5   Entered and Authorized by:   Jackson Latino MD   Signed by:   Jackson Latino MD on 01/22/2010   Method used:   Electronically to        [facility] [address] (retail)       [address]       [place], [place]  [address]       Ph: [phone]       Fax: [phone]   RxID:    [id number] TRAMADOL HCL 50 MG TABS (TRAMADOL HCL) One tab as needed for back pain, up to four times a day  #120 x 3   Entered and Authorized by:   Jackson Latino MD   Signed by:   Jackson Latino MD on 01/22/2010   Method used:   Electronically to        [facility] [address] (retail)       [address]       [place], [place]  [address]       Ph: [phone]       Fax: [phone]   RxID:   [id number] FLEXERIL 5 MG TABS (CYCLOBENZAPRINE HCL) Take 1 tablet by mouth three times a day as needed for muscle pain  #60 x 1   Entered and Authorized by:   Jackson Latino MD   Signed by:   Jackson Latino MD on 01/22/2010   Method used:   Electronically to        [facility] [address] (retail)       [address] [place], [place]  [address]       Ph: [phone]       Fax: [phone]   RxID:   [id number]    Orders Added: 1)  Capillary Blood Glucose/CBG [67893] 2)  Capillary Blood Glucose/CBG [81017] 3)  Ophthalmology Referral [Ophthalmology] 4)  Est. Patient Level IV [51025]     Vital Signs:  Patient profile:   55 year old male Height:      69 inches Weight:      151.03 pounds BMI:     22.38 Temp:     97 degrees F oral Pulse rate:   97 / minute Pulse (ortho):   100 / minute BP sitting:   141 / 95  (right arm) BP standing:   139 / 91 Cuff size:   regular  Vitals Entered By: Angelina Ok RN (January 22, 2010 11:02 AM)   Prevention & Chronic Care Immunizations   Influenza vaccine: Fluvax 3+  (05/29/2009)   Influenza vaccine deferral: Deferred  (06/01/2009)   Influenza vaccine due: 05/29/2009    Tetanus booster: 05/29/2009: Tdap   Td booster deferral: Deferred  (06/01/2009)   Tetanus booster due: 05/30/2019  Pneumococcal vaccine: Not documented   Pneumococcal vaccine deferral: Deferred  (06/01/2009)  Colorectal Screening   Hemoccult: Not documented   Hemoccult action/deferral: Deferred   (01/22/2010)   Hemoccult due: 05/30/2010    Colonoscopy: DONE  (10/23/2009)   Colonoscopy action/deferral: GI referral  (05/29/2009)   Colonoscopy due: 10/2019  Other Screening   PSA: Not documented   PSA action/deferral: Discussed-PSA declined  (05/29/2009)   Smoking status: never  (01/22/2010)  Diabetes Mellitus   HgbA1C: 7.2  (11/16/2009)    Eye exam: normal  (09/15/2008)   Diabetic eye exam action/deferral: Ophthalmology referral  (01/22/2010)   Eye exam due: 09/15/2009    Foot exam: yes  (01/22/2010)   Foot exam action/deferral: Do today   High risk foot: No  (01/22/2010)   Foot care education: Done  (01/22/2010)   Foot exam due: 09/15/2009    Urine microalbumin/creatinine ratio: 5.7  (06/01/2009)    Diabetes flowsheet reviewed?: Yes   Progress toward A1C goal: Improved  Lipids   Total Cholesterol: 145  (06/01/2009)   Lipid panel action/deferral: Lipid Panel ordered   LDL: 81  (06/01/2009)   LDL Direct: Not documented   HDL: 54  (06/01/2009)   Triglycerides: 49  (06/01/2009)   Lipid panel due: 11/29/2009  Hypertension   Last Blood Pressure: 141 / 95  (01/22/2010)   Serum creatinine: 0.83  (11/16/2009)   Serum potassium 4.3  (11/16/2009)    Hypertension flowsheet reviewed?: Yes   Progress toward BP goal: Deteriorated  Self-Management Support :   Personal Goals (by the next clinic visit) :     Personal A1C goal: 7  (05/29/2009)     Personal blood pressure goal: 130/80  (05/29/2009)     Personal LDL goal: 100  (05/29/2009)    Patient will work on the following items until the next clinic visit to reach self-care goals:     Medications and monitoring: take my medicines every day, check my blood sugar, bring all of my medications to every visit, examine my feet every day  (01/22/2010)     Eating: drink diet soda or water instead of juice or soda, eat more vegetables, use fresh or frozen vegetables, eat foods that are low in salt, eat fruit for snacks and  desserts, limit or avoid alcohol  (01/22/2010)     Activity: take a 30 minute walk every day  (01/22/2010)    Diabetes self-management support: Written self-care plan, Education handout, Pre-printed educational material, Resources for patients handout  (01/22/2010)   Diabetes care plan printed   Diabetes education handout printed   Last diabetes self-management training by diabetes educator: 11/13/2009    Hypertension self-management support: Written self-care plan, Education handout, Pre-printed educational material, Resources for patients handout  (01/22/2010)   Hypertension self-care plan printed.   Hypertension education handout printed      Resource handout printed.   Nursing Instructions: Refer for screening diabetic eye exam (see order) Diabetic foot exam today    Last LDL:                                                 81 (06/01/2009 7:55:00 PM)        Diabetic Foot Exam Foot Inspection Is there a history of a foot ulcer?              No Is there a  foot ulcer now?              No Can the patient see the bottom of their feet?          Yes Are the shoes appropriate in style and fit?          No Is there swelling or an abnormal foot shape?          No Are the toenails long?                No Are the toenails thick?                Yes Are the toenails ingrown?              No Is there heavy callous build-up?              Yes Is there a claw toe deformity?                          No Is there elevated skin temperature?            No Is there limited ankle dorsiflexion?            No Is there foot or ankle muscle weakness?            No Do you have pain in calf while walking?           Yes      Diabetic Foot Care Education :Patient educated on appropriate care of diabetic feet.  Pulse Check          Right Foot          Left Foot Posterior Tibial:        normal            normal Dorsalis Pedis:        normal            normal Comments: Callus build up on heels.   Great Toe on right foot  callus build up..  Cracking under foot. High Risk Feet? No   10-g (5.07) Semmes-Weinstein Monofilament Test Performed by: Angelina Ok RN          Right Foot          Left Foot Visual Inspection               Test Control      normal         normal Site 1         normal         normal Site 2         normal         normal Site 3         normal         normal Site 4         normal         normal Site 5         normal         normal Site 6         normal         normal Site 7         normal         normal Site 8         normal         normal Site 9  normal         normal Site 10         normal         normal  Impression      normal         normal

## 2010-04-16 NOTE — Letter (Signed)
Summary: [facility] Instructions  [facility]  [address] [place], [place] [address]   Phone: [phone]  Fax: [phone]       Mitchell Page    1955/07/27    MRN: [id number]        Procedure Day /Date: Tuesday 10/23/09     Arrival Time: 8:30 am     Procedure Time: 9:30 am     Location of Procedure:                    _x _  [facility] (4th Floor)                      PREPARATION FOR COLONOSCOPY WITH MOVIPREP   Starting 5 days prior to your procedure (10/18/09) do not eat nuts, seeds, popcorn, corn, beans, peas,  salads, or any raw vegetables.  Do not take any fiber supplements (e.g. Metamucil, Citrucel, and Benefiber).  THE DAY BEFORE YOUR PROCEDURE         DATE: 10/22/09  DAY: Monday  1.  Drink clear liquids the entire day-NO SOLID FOOD  2.  Do not drink anything colored red or purple.  Avoid juices with pulp.  No orange juice.  3.  Drink at least 64 oz. (8 glasses) of fluid/clear liquids during the day to prevent dehydration and help the prep work efficiently.  CLEAR LIQUIDS INCLUDE: Water Jello Ice Popsicles Tea (sugar ok, no milk/cream) Powdered fruit flavored drinks Coffee (sugar ok, no milk/cream) Gatorade Juice: apple, white grape, white cranberry  Lemonade Clear bullion, consomm, broth Carbonated beverages (any kind) Strained chicken noodle soup Hard Candy                             4.  In the morning, mix first dose of MoviPrep solution:    Empty 1 Pouch A and 1 Pouch B into the disposable container    Add lukewarm drinking water to the top line of the container. Mix to dissolve    Refrigerate (mixed solution should be used within 24 hrs)  5.  Begin drinking the prep at 5:00 p.m. The MoviPrep container is divided by 4 marks.   Every 15 minutes drink the solution down to the next mark (approximately 8 oz) until the full liter is complete.   6.  Follow completed prep with 16 oz of clear liquid of your choice (Nothing  red or purple).  Continue to drink clear liquids until bedtime.  7.  Before going to bed, mix second dose of MoviPrep solution:    Empty 1 Pouch A and 1 Pouch B into the disposable container    Add lukewarm drinking water to the top line of the container. Mix to dissolve    Refrigerate  THE DAY OF YOUR PROCEDURE      DATE: 10/23/09 DAY: Tuesday  Beginning at 4:30 a.m. (5 hours before procedure):         1. Every 15 minutes, drink the solution down to the next mark (approx 8 oz) until the full liter is complete.  2. Follow completed prep with 16 oz. of clear liquid of your choice.    3. You may drink clear liquids until 7:30 am (2 HOURS BEFORE PROCEDURE).   MEDICATION INSTRUCTIONS  Unless otherwise instructed, you should take regular prescription medications with a small sip of water   as early as possible the morning of your procedure.  Diabetic patients - see separate instructions.        OTHER INSTRUCTIONS  You will need a responsible adult at least 55 years of age to accompany you and drive you home.   This person must remain in the waiting room during your procedure.  Wear loose fitting clothing that is easily removed.  Leave jewelry and other valuables at home.  However, you may wish to bring a book to read or  an iPod/MP3 player to listen to music as you wait for your procedure to start.  Remove all body piercing jewelry and leave at home.  Total time from sign-in until discharge is approximately 2-3 hours.  You should go home directly after your procedure and rest.  You can resume normal activities the  day after your procedure.  The day of your procedure you should not:   Drive   Make legal decisions   Operate machinery   Drink alcohol   Return to work  You will receive specific instructions about eating, activities and medications before you leave.    The above instructions have been reviewed and explained to me by   Lamona Curl CMA  Duncan Dull)  September 13, 2009 10:32 AM     I fully understand and can verbalize these instructions _____________________________ Date 09/13/09

## 2010-04-16 NOTE — Letter (Signed)
Summary: Diabetic Instructions  [facility]  [address] [place], [place] [address]   Phone: [phone]  Fax: [phone]    Mitchell Page Jan 28, 1956 MRN: [id number]   _x  _   ORAL DIABETIC MEDICATION INSTRUCTIONS glyburide, metformin The day before your procedure:   Take your diabetic pill as you do normally  The day of your procedure:   Do not take your diabetic pill    We will check your blood sugar levels during the admission process and again in Recovery before discharging you home  ________________________________________________________________________  _ x _   INSULIN (LONG ACTING) MEDICATION INSTRUCTIONS (Novolin-N)   The day before your procedure:   Take  your regular evening dose    The day of your procedure:   Do not take your morning dose

## 2010-04-16 NOTE — Letter (Signed)
Summary: Patient Notice-Endo Biopsy Results  [facility]  [address] [place], [place] [address]   Phone: [phone]  Fax: [phone]        October 26, 2009 MRN: [id number]    [facility] [address] [place], [place]  [address]    Dear Mitchell Page,  I am pleased to inform you that the biopsies taken during your recent endoscopic examination did not show any evidence of cancer upon pathologic examination.  Additional information/recommendations:  __No further action is needed at this time.  Please follow-up with      your primary care physician for your other healthcare needs.  __ Please call [phone] to schedule a return visit to review      your condition.  _X_ Continue with the treatment plan as outlined on the day of your      exam.RX. FOR TREATMENT OF GASTRITIS WITH H.PYLORI TO BE PHONED IN.  __ You should have a repeat endoscopic examination for this problem              in _ months/years.   Please call us if you are having persistent problems or have questions about your condition that have not been fully answered at this time.  Sincerely,  Mitchell Layman MD [facility]  This letter has been electronically signed by your physician.  Appended Document: Patient Notice-Endo Biopsy Results letter mailed

## 2010-04-16 NOTE — Progress Notes (Signed)
Summary: refill/gg  Phone Note Refill Request  on May 30, 2009 12:07 PM  Refills Requested: Medication #1:  ULTRACET 37.5-325 MG TABS four times a day tab as needed  for back pain **Health Dept wants to change ultracet to tramadol as that's what they carry.  Will you change?   Method Requested: Fax to Local Pharmacy Initial call taken by: Merrie Roof RN,  May 30, 2009 12:07 PM  Additional Follow-up for Phone Call Additional follow up Details #1::        Rx called to pharmacy/[facility] as requested Additional Follow-up by: Merrie Roof RN,  June 05, 2009 9:46 AM    New/Updated Medications: ACETAMINOPHEN 325 MG TABS (ACETAMINOPHEN) Take 1 tablet by mouth four times a day as needed (take it together with Tramadol) Patient failed therapy with Ultram/Tramadol. Please, change to Tramadol 50 mg i by mouth qid as needed PLUS Acetaminophen 325 mg i by mouth qid (take with tramadol ) as needed. thank you.Prescriptions: TRAMADOL HCL 50 MG TABS (TRAMADOL HCL) One tab as needed for back pain, up to four times a day  #120 x 3   Entered and Authorized by:   Deatra Robinson MD   Signed by:   Deatra Robinson MD on 06/03/2009   Method used:   Electronically to        [facility] [address] (retail)       [phone]       [place], [place]  [address]       Ph: [phone]       Fax: [phone]   RxID:   [id number] ACETAMINOPHEN 325 MG TABS (ACETAMINOPHEN) Take 1 tablet by mouth four times a day as needed (take it together with Tramadol)  #120 x 3   Entered and Authorized by:   Deatra Robinson MD   Signed by:   Deatra Robinson MD on 06/03/2009   Method used:   Electronically to        [facility] [address] (retail)       [phone]       [place], [place]  [address]       Ph: [phone]       Fax: [phone]   RxID:   [id number]

## 2010-04-16 NOTE — Progress Notes (Signed)
Summary: refill/ hla  Phone Note Refill Request Message from:  Patient on May 16, 2009 4:00 PM  Refills Requested: Medication #1:  METFORMIN HCL 1000 MG TABS Take 1 tablet by mouth two times a day  Medication #2:  TRAMADOL HCL 50 MG TABS Take 1 tablet by mouth four times a day Initial call taken by: Marin Roberts RN,  May 16, 2009 4:00 PM

## 2010-04-16 NOTE — Miscellaneous (Signed)
Summary: Orders Update/clotest  Clinical Lists Changes  Orders: Added new Test order of TLB-H Pylori Screen Gastric Biopsy (83013-CLOTEST) - Signed 

## 2010-04-16 NOTE — Assessment & Plan Note (Signed)
Summary: CONSTIPATION...AS.   History of Present Illness Visit Type: consult  Primary GI MD: Sheryn Bison MD FACP FAGA Primary Provider: Deatra Robinson, MD  Requesting Provider: Deatra Robinson, MD  Chief Complaint: constipation History of Present Illness:   55 year old African male from [place] has been in [place] for 2 years. He is referred by [facility] for evaluation of chronic constipation without rectal bleeding, abdominal gas and bloating, early satiety, reflux symptoms with intermittent solid food dysphagia, and mild anorexia and weight loss. There is a language problem during the interview and his son was present for interpretation.  Patient has insulin dependent diabetes and is on oral medications and insulin 3 times a day. He has a polyarticular arthralgia complex treated with p.r.n. tramadol. He also takes regular Naprosyn, Gabby Pentam, and frequent Tylenol. He denies any associated skin rashes, mouth sores, fever or chills. He denies any specific food intolerances, melena or hematochezia. Lab review shows poor diabetic control, no evidence of renal insufficiency, and normal CBC and normal liver function tests. Patient never had previous barium studies or endoscopic exams. He has had a CT scan of the abdomen performed on May 11 which was entirely normal several some mild lumbosacral scoliosis.  Family history is noncontributory. The patient denies a history of hepatitis, pancreatitis, intestinal parasites, or other systemic illnesses.He denies previous malaria episodes.     GI Review of Systems    Reports acid reflux, bloating, heartburn, and  loss of appetite.      Denies abdominal pain, belching, chest pain, dysphagia with liquids, dysphagia with solids, nausea, vomiting, vomiting blood, weight loss, and  weight gain.      Reports constipation.     Denies anal fissure, black tarry stools, change in bowel habit, diarrhea, diverticulosis, fecal  incontinence, heme positive stool, hemorrhoids, irritable bowel syndrome, jaundice, light color stool, liver problems, rectal bleeding, and  rectal pain.    Current Medications (verified): 1)  Tramadol Hcl 50 Mg Tabs (Tramadol Hcl) .... One Tab As Needed For Back Pain, Up To Four Times A Day 2)  Omeprazole 40 Mg Cpdr (Omeprazole) .... Take 1 Tablet By Mouth Two Times A Day 3)  Naprosyn 500 Mg Tabs (Naproxen) .... Take 1 Tablet By Mouth Two Times A Day With Meals 4)  Gabapentin 300 Mg Caps (Gabapentin) .... Take 1 Tablet By Mouth Once A Day For 3 Days Then One Tablet By Mouth Bid 5)  Glyburide 5 Mg Tabs (Glyburide) .... 2 Tabs Q Am 6)  Metformin Hcl 1000 Mg Tabs (Metformin Hcl) .... Take 1 Tablet By Mouth Two Times A Day 7)  Prodigy Blood Glucose Monitor W/device Kit (Blood Glucose Monitoring Suppl) .... Check Your Blood Sugarbefore Breakfast 8)  Prodigy Twist Top Lancets 28g  Misc (Lancets) .... Check Yoiur Blood Glucose Before Breakfast 9)  Exactech Test  Strp (Glucose Blood) .... Check Your Blood Glucose Before Breakfast 10)  Lisinopril 10 Mg Tabs (Lisinopril) .... Take 1 Tablet By Mouth Once A Day 11)  Acetaminophen 325 Mg Tabs (Acetaminophen) .... Take 1 Tablet By Mouth Four Times A Day As Needed (Take It Together With Tramadol) 12)  Truetrack Test  Strp (Glucose Blood) .... Use To Test Blood Sugar  A One Time Day 13)  Novolin N 100 Unit/ml Susp (Insulin Isophane Human) .... Inject 6 Units Subcutaneously Three Times A Day Ac  Allergies (verified): No Known Drug Allergies  Past History:  Past medical, surgical, family and social histories (including risk factors) reviewed for  relevance to current acute and chronic problems.  Past Medical History: Reviewed history from 09/15/2008 and no changes required. 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 Patient states that he herniated his spine performing heavy lifting for  10 years at his job. Last MRI 04/2008.   Past Surgical History: Reviewed history from 09/12/2009 and no changes required. L4-5 spine Laminectomy 2008 Repair of Left Arm Fracture  Family History: Reviewed history from 09/15/2008 and no changes required. Unknown per patient  Social History: Reviewed history from 09/15/2008 and no changes required. Immigrated from [place] in 1990. 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  Review of Systems General:  Complains of anorexia, fatigue, and sleep disorder; denies fever, chills, sweats, weakness, malaise, and weight loss. Eyes:  Complains of vision loss; denies blurring, diplopia, irritation, discharge, scotoma, eye pain, and photophobia. CV:  Complains of palpitations; denies chest pains, angina, syncope, dyspnea on exertion, orthopnea, PND, peripheral edema, and claudication. Resp:  Complains of cough; denies dyspnea at rest, dyspnea with exercise, sputum, wheezing, coughing up blood, and pleurisy. GI:  Complains of indigestion/heartburn, abdominal pain, gas/bloating, and constipation; denies difficulty swallowing, pain on swallowing, nausea, vomiting, vomiting blood, jaundice, diarrhea, change in bowel habits, bloody BM's, black BMs, and fecal incontinence. GU:  Complains of urinary frequency; denies urinary burning, blood in urine, urinary hesitancy, nocturnal urination, urinary incontinence, penile discharge, genital sores, decreased libido, and erectile dysfunction. MS:  Complains of joint pain / LOM, joint swelling, joint stiffness, joint deformity, low back pain, and leg pain at night; denies muscle weakness, muscle cramps, muscle atrophy, leg pain with exertion, and shoulder pain / LOM hand / wrist pain (CTS). Derm:  Denies rash, itching, dry skin, hives, moles, warts, and unhealing ulcers. Neuro:  Complains of frequent  headaches and radiculopathy other:; denies weakness, paralysis, abnormal sensation, seizures, syncope, tremors, vertigo, transient blindness, frequent falls, difficulty walking, headache, sciatica, restless legs, memory loss, and confusion. Psych:  Complains of confusion; denies depression, anxiety, memory loss, suicidal ideation, hallucinations, paranoia, and phobia. Endo:  Complains of cold intolerance; denies heat intolerance, polydipsia, polyphagia, polyuria, unusual weight change, and hirsutism. Heme:  Denies bruising, bleeding, enlarged lymph nodes, and pagophagia. Allergy:  Complains of hay fever.  Vital Signs:  Patient profile:   55 year old male Height:      69 inches Weight:      148 pounds BMI:     21.93 BSA:     1.82 Pulse rate:   100 / minute BP sitting:   120 / 60  (left arm) Cuff size:   regular  Vitals Entered By: Ok Anis CMA (September 13, 2009 9:52 AM)  Physical Exam  General:  Well developed, well nourished, no acute distress.healthy appearing.   Head:  Normocephalic and atraumatic. Eyes:  PERRLA, no icterus.exam deferred to patient's ophthalmologist.   Neck:  Supple; no masses or thyromegaly. Lungs:  Clear throughout to auscultation. Heart:  Regular rate and rhythm; no murmurs, rubs,  or bruits. Abdomen:  Soft, nontender and nondistended. No masses, hepatosplenomegaly or hernias noted. Normal bowel sounds. Rectal:  deferred until time of colonoscopy.   Msk:  Symmetrical with no gross deformities. Normal posture. Extremities:  No clubbing, cyanosis, edema or deformities noted. Neurologic:  Alert and  oriented x4;  grossly normal neurologically. Cervical Nodes:  No significant cervical adenopathy.  Psych:  Alert and cooperative. Normal mood and affect.   Impression & Recommendations:  Problem # 1:  GERD (ICD-530.81) Assessment Deteriorated chronic GERD despite taking a metaprolol 40 mg twice a day. I suspect he has an element of delayed gastric emptying  associated with his diabetes, and possibly a peptic stricture of his esophagus, and possibly H. pylori infection. Previous CT scan of the abdomen was normal. We have scheduled him for endoscopic exam and possible dilation. He also may need gastric emptying scan study. Screening labs again have been ordered for review. Nothing in his history suggests chronic malabsorption syndrome Orders: TLB-CBC Platelet - w/Differential (85025-CBCD) TLB-BMP (Basic Metabolic Panel-BMET) (80048-METABOL) TLB-Hepatic/Liver Function Pnl (80076-HEPATIC) TLB-TSH (Thyroid Stimulating Hormone) (84443-TSH) TLB-IBC Pnl (Iron/FE;Transferrin) (83550-IBC) TLB-Folic Acid (Folate) (82746-FOL) TLB-Ferritin (82728-FER) TLB-B12, Serum-Total ONLY ([address]-U04) TLB-Amylase (82150-AMYL) TLB-Lipase (83690-LIPASE) TLB-Magnesium (Mg) (83735-MG) TLB-Rheumatoid Factor (RA) (54098-JX) TLB-Sedimentation Rate (ESR) (85652-ESR) T-Beta Carotene (91478-29562) T-Sprue Panel (Celiac Disease Aby Eval) (83516x3/[address])  Problem # 2:  CONSTIPATION (ICD-564.00) Assessment: Unchanged Colonoscopy schedule his convenience. Again, I suspect is Gery Pray regularities associated with his diabetes, peripheral neuropathy, and his multiple medications. Orders: TLB-CBC Platelet - w/Differential (85025-CBCD) TLB-BMP (Basic Metabolic Panel-BMET) (80048-METABOL) TLB-Hepatic/Liver Function Pnl (80076-HEPATIC) TLB-TSH (Thyroid Stimulating Hormone) (84443-TSH) TLB-IBC Pnl (Iron/FE;Transferrin) (83550-IBC) TLB-Folic Acid (Folate) (82746-FOL) TLB-Ferritin (82728-FER) TLB-B12, Serum-Total ONLY (13086-V78) TLB-Amylase (82150-AMYL) TLB-Lipase (83690-LIPASE) TLB-Magnesium (Mg) (83735-MG) TLB-Rheumatoid Factor (RA) (46962-XB) TLB-Sedimentation Rate (ESR) (85652-ESR) T-Beta Carotene (28413-24401) T-Sprue Panel (Celiac Disease Aby Eval) (83516x3/[address])  Problem # 3:  BACK PAIN, WITH RADIATION, UNSPEC. (ICD-724.4) Assessment: Unchanged He Has a chronic  polyarticular arthritis of unexplained etiology. There is no evidence of active joint inflammation at this time to suggest gouty arthritis. Patient may need orthopedic or rheumatology referral. I have ordered rheumatoid factor antibody testing. This of note that the patient is on blood tramadol and Naprosyn and may have some gastrointestinal because of injury related to NSAID use.  Problem # 4:  HYPERTENSION (ICD-401.9) Assessment: Improved  Patient Instructions: 1)  Copy sent to : [facility] 2)  Please continue current medications.  3)  Constipation and Hemorrhoids brochure given.  4)  Colonoscopy and Flexible Sigmoidoscopy brochure given.  5)  Conscious Sedation brochure given.  6)  Upper Endoscopy brochure given.  Prescriptions: MOVIPREP 100 GM  SOLR (PEG-KCL-NACL-NASULF-NA ASC-C) As per prep instructions.  #1 x 0   Entered by:   Lamona Curl CMA (AAMA)   Authorized by:   Mardella Layman MD [facility]   Signed by:   Lamona Curl CMA (AAMA) on 09/13/2009   Method used:   Electronically to        [facility] [address] (retail)       [phone]       [place], [place]  [address]       Ph: [phone]       Fax: [phone]   RxID:   [id number]   Appended Document: Orders Update    Clinical Lists Changes  Orders: Added new Test order of Colon/Endo (Colon/Endo) - Signed

## 2010-04-16 NOTE — Progress Notes (Signed)
Summary: Soc. Work  Programme researcher, broadcasting/film/video of Call: Called Mitchell Page and encouraged him to apply for disability again.  He apparently applied last year but got denied.   I've urged him to go to Soc. Security and reapply just like he did the last time.   He has not worked since 2009/2010 and he has back issues and lifting problems as well as a problem with one of his hands.   He cannot find light duty work.  I've instructed him to continue with his claim even if he is denied and once he is denied he needs to get an attorney to help him with his claim.

## 2010-04-16 NOTE — Letter (Signed)
Summary: New Patient letter  [facility]  [address] [place], [place] [address]   Phone: [phone]  Fax: [phone]       08/10/2009 MRN: [id number]  [facility] [address] [place], [place]  [address]  Dear Mitchell Page,  Welcome to the Gastroenterology Division at [facility].    You are scheduled to see Dr. Jarold Motto on 09/13/2009 at 8:30AM on the 3rd floor at [facility], [address]  We ask that you try to arrive at our office 15 minutes prior to your appointment time to allow for check-in.  We would like you to complete the enclosed self-administered evaluation form prior to your visit and bring it with you on the day of your appointment.  We will review it with you.  Also, please bring a complete list of all your medications or, if you prefer, bring the medication bottles and we will list them.  Please bring your insurance card so that we may make a copy of it.  If your insurance requires a referral to see a specialist, please bring your referral form from your primary care physician.  Co-payments are due at the time of your visit and may be paid by cash, check or credit card.     Your office visit will consist of a consult with your physician (includes a physical exam), any laboratory testing he/she may order, scheduling of any necessary diagnostic testing (e.g. x-ray, ultrasound, CT-scan), and scheduling of a procedure (e.g. Endoscopy, Colonoscopy) if required.  Please allow enough time on your schedule to allow for any/all of these possibilities.    If you cannot keep your appointment, please call [phone] to cancel or reschedule prior to your appointment date.  This allows [place] the opportunity to schedule an appointment for another patient in need of care.  If you do not cancel or reschedule by 5 p.m. the business day prior to your appointment date, you will be charged a $50.00 late cancellation/no-show fee.    Thank you for  choosing [facility] for your medical needs.  We appreciate the opportunity to care for you.  Please visit [place] at our website  to learn more about our practice.                     Sincerely,                                                             The Gastroenterology Division

## 2010-04-16 NOTE — Assessment & Plan Note (Signed)
Summary: NOT HFU/2 WEEK RECHECK PER SILWAL/CH   Vital Signs:  Patient profile:   55 year old male Height:      69 inches (175.26 cm) Weight:      155.0 pounds (68.91 kg) BMI:     22.47 Temp:     97.3 degrees F (36.28 degrees C) oral Pulse rate:   85 / minute BP sitting:   146 / 85  (right arm) Cuff size:   large  Vitals Entered By: Theotis Barrio NT II (June 19, 2009 2:12 PM) CC: CHRONIC BACK PAIN   /  2 WEEK FOLLOW UP APPT   / DM Is Patient Diabetic? Yes Did you bring your meter with you today? Yes/ SYSTEM DOWN UNABLE TO DOWN LOAD Pain Assessment Patient in pain? yes     Location: back Intensity:     3 Type:   "PAIN" Onset of pain  Chronic Nutritional Status BMI of 19 -24 = normal  Have you ever been in a relationship where you felt threatened, hurt or afraid?No   Does patient need assistance? Functional Status Self care Ambulation Normal Comments CHRONIC BACK PAIN /  PATIENT IS HERE FOR 2  WEEK FOLLO W UP APPT.   CC:  CHRONIC BACK PAIN   /  2 WEEK FOLLOW UP APPT   / DM.  History of Present Illness: 2 weeks f/u on: 1.DM --saw Ms. Rhiley for DME. Reports being very helpfull. Takes Metformin. No hypoglycemia 2. Continues to have poor eyesight --on a waiting list to see  an ophtalmologist.   Preventive Screening-Counseling & Management  Alcohol-Tobacco     Alcohol drinks/day: 0     Smoking Status: never  Caffeine-Diet-Exercise     Caffeine use/day: 0     Does Patient Exercise: no  Medications Prior to Update: 1)  Tramadol Hcl 50 Mg Tabs (Tramadol Hcl) .... One Tab As Needed For Back Pain, Up To Four Times A Day 2)  Omeprazole 40 Mg Cpdr (Omeprazole) .... Take 1 Tablet By Mouth Two Times A Day 3)  Naprosyn 500 Mg Tabs (Naproxen) .... Take 1 Tablet By Mouth Two Times A Day With Meals 4)  Gabapentin 300 Mg Caps (Gabapentin) .... Take 1 Tablet By Mouth Once A Day For 3 Days Then One Tablet By Mouth Bid 5)  Glyburide 5 Mg Tabs (Glyburide) .... 2 Tabs Q Am 6)   Metformin Hcl 1000 Mg Tabs (Metformin Hcl) .... Take 1 Tablet By Mouth Two Times A Day 7)  Prodigy Blood Glucose Monitor W/device Kit (Blood Glucose Monitoring Suppl) .... Check Your Blood Sugarbefore Breakfast 8)  Prodigy Twist Top Lancets 28g  Misc (Lancets) .... Check Yoiur Blood Glucose Before Breakfast 9)  Exactech Test  Strp (Glucose Blood) .... Check Your Blood Glucose Before Breakfast 10)  Lisinopril 5 Mg Tabs (Lisinopril) .... Take 1 Tablet By Mouth Once A Day 11)  Acetaminophen 325 Mg Tabs (Acetaminophen) .... Take 1 Tablet By Mouth Four Times A Day As Needed (Take It Together With Tramadol)  Current Medications (verified): 1)  Tramadol Hcl 50 Mg Tabs (Tramadol Hcl) .... One Tab As Needed For Back Pain, Up To Four Times A Day 2)  Omeprazole 40 Mg Cpdr (Omeprazole) .... Take 1 Tablet By Mouth Two Times A Day 3)  Naprosyn 500 Mg Tabs (Naproxen) .... Take 1 Tablet By Mouth Two Times A Day With Meals 4)  Gabapentin 300 Mg Caps (Gabapentin) .... Take 1 Tablet By Mouth Once A Day For 3 Days Then  One Tablet By Mouth Bid 5)  Glyburide 5 Mg Tabs (Glyburide) .... 2 Tabs Q Am 6)  Metformin Hcl 1000 Mg Tabs (Metformin Hcl) .... Take 1 Tablet By Mouth Two Times A Day 7)  Prodigy Blood Glucose Monitor W/device Kit (Blood Glucose Monitoring Suppl) .... Check Your Blood Sugarbefore Breakfast 8)  Prodigy Twist Top Lancets 28g  Misc (Lancets) .... Check Yoiur Blood Glucose Before Breakfast 9)  Exactech Test  Strp (Glucose Blood) .... Check Your Blood Glucose Before Breakfast 10)  Lisinopril 10 Mg Tabs (Lisinopril) .... Take 1 Tablet By Mouth Once A Day 11)  Acetaminophen 325 Mg Tabs (Acetaminophen) .... Take 1 Tablet By Mouth Four Times A Day As Needed (Take It Together With Tramadol)  Allergies (verified): No Known Drug Allergies  Past History:  Risk Factors: Alcohol Use: 0 (06/19/2009) Caffeine Use: 0 (06/19/2009) Exercise: no (06/19/2009)  Risk Factors: Smoking Status: never  (06/19/2009)  Past medical, surgical, family and social histories (including risk factors) reviewed, and no changes noted (except as noted below).  Past Medical History: Reviewed history from 09/15/2008 and no changes required. DM, type 2 diagnosed 2001 GERD LBP r/t herniated disc -  chronic;s/p laminectomy in 2008 at [facility]. Patient states that he herniated his spine performing heavy lifting for 10 years at his job. Last MRI 04/2008.   Past Surgical History: Reviewed history from 09/15/2008 and no changes required. L4-5 spine Laminectomy 2008  Family History: Reviewed history from 09/15/2008 and no changes required. Unknown per patient  Social History: Reviewed history from 09/15/2008 and no changes required. Immigrated from [place] in 1990. 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  Review of Systems  The patient denies anorexia, fever, weight loss, weight gain, vision loss, decreased hearing, hoarseness, chest pain, syncope, dyspnea on exertion, peripheral edema, prolonged cough, headaches, hemoptysis, abdominal pain, melena, hematochezia, severe indigestion/heartburn, hematuria, incontinence, genital sores, muscle weakness, suspicious skin lesions, transient blindness, difficulty walking, depression, unusual weight change, abnormal bleeding, enlarged lymph nodes, angioedema, breast masses, and testicular masses.    Physical Exam  General:  alert and well-developed.   Head:  normocephalic and atraumatic.   Eyes:  vision grossly intact, pupils equal, pupils round, and pupils reactive to light.   Ears:  R ear normal and L ear normal.   Nose:  Nasal septum deviation to left without obstruction. Mouth:  no gingival abnormalities and fair dentition.   Neck:  supple, full ROM, and no masses.   Chest Wall:  no deformities.   Lungs:  normal respiratory  effort and normal breath sounds.   Heart:  normal rate and regular rhythm.   Abdomen:  soft and non-tender.   Rectal:  declined Genitalia:  declined Prostate:  declined Msk:  L-3-5 paravertebral mild tenderness to plapation; SLR test negative bilaterally. Pulses:  normal peripheral pulses  Extremities:  no cyanosis, clubbing or edema  Neurologic:  non focal   Diabetes Management Exam:    Foot Exam (with socks and/or shoes not present):       Sensory-Pinprick/Light touch:          Left medial foot (L-4): normal          Left dorsal foot (L-5): normal          Left lateral foot (S-1): normal          Right medial foot (L-4): normal  Right dorsal foot (L-5): normal          Right lateral foot (S-1): normal       Sensory-Monofilament:          Left foot: normal          Right foot: normal       Inspection:          Left foot: normal          Right foot: normal       Nails:          Left foot: normal          Right foot: normal   Impression & Recommendations:  Problem # 1:  DIABETES-TYPE 2 (ICD-250.00) Patient follows up with all instructions. Diet, exercise reviewed. Continue with DME. His updated medication list for this problem includes:    Glyburide 5 Mg Tabs (Glyburide) .Marland Kitchen... 2 tabs q am    Metformin Hcl 1000 Mg Tabs (Metformin hcl) .Marland Kitchen... Take 1 tablet by mouth two times a day    Lisinopril 10 Mg Tabs (Lisinopril) .Marland Kitchen... Take 1 tablet by mouth once a day  Problem # 2:  HYPERTENSION (ICD-401.9) Will increase lisinopril to 10 mg by mouth daily. His updated medication list for this problem includes:    Lisinopril 10 Mg Tabs (Lisinopril) .Marland Kitchen... Take 1 tablet by mouth once a day  BP today: 146/85 Prior BP: 134/88 (06/01/2009)  Labs Reviewed: K+: 4.3 (06/01/2009) Creat: : 0.91 (06/01/2009)   Chol: 145 (06/01/2009)   HDL: 54 (06/01/2009)   LDL: 81 (06/01/2009)   TG: 49 (06/01/2009)  Complete Medication List: 1)  Tramadol Hcl 50 Mg Tabs (Tramadol hcl) .... One tab  as needed for back pain, up to four times a day 2)  Omeprazole 40 Mg Cpdr (Omeprazole) .... Take 1 tablet by mouth two times a day 3)  Naprosyn 500 Mg Tabs (Naproxen) .... Take 1 tablet by mouth two times a day with meals 4)  Gabapentin 300 Mg Caps (Gabapentin) .... Take 1 tablet by mouth once a day for 3 days then one tablet by mouth bid 5)  Glyburide 5 Mg Tabs (Glyburide) .... 2 tabs q am 6)  Metformin Hcl 1000 Mg Tabs (Metformin hcl) .... Take 1 tablet by mouth two times a day 7)  Prodigy Blood Glucose Monitor W/device Kit (Blood glucose monitoring suppl) .... Check your blood sugarbefore breakfast 8)  Prodigy Twist Top Lancets 28g Misc (Lancets) .... Check yoiur blood glucose before breakfast 9)  Exactech Test Strp (Glucose blood) .... Check your blood glucose before breakfast 10)  Lisinopril 10 Mg Tabs (Lisinopril) .... Take 1 tablet by mouth once a day 11)  Acetaminophen 325 Mg Tabs (Acetaminophen) .... Take 1 tablet by mouth four times a day as needed (take it together with tramadol) Prescriptions: LISINOPRIL 10 MG TABS (LISINOPRIL) Take 1 tablet by mouth once a day  #30 x 5   Entered and Authorized by:   Deatra Robinson MD   Signed by:   Deatra Robinson MD on 06/19/2009   Method used:   Electronically to        [facility] [address] (retail)       [phone]       [place], [place]  [address]       Ph: [phone]       Fax: [phone]   RxID:   [id number]    Prevention & Chronic Care Immunizations  Influenza vaccine: Fluvax 3+  (05/29/2009)   Influenza vaccine deferral: Deferred  (06/01/2009)   Influenza vaccine due: 05/29/2009    Tetanus booster: 05/29/2009: Tdap   Td booster deferral: Deferred  (06/01/2009)   Tetanus booster due: 05/30/2019    Pneumococcal vaccine: Not documented   Pneumococcal vaccine deferral: Deferred  (06/01/2009)  Colorectal Screening   Hemoccult: Not documented   Hemoccult action/deferral: Ordered   (05/29/2009)   Hemoccult due: 05/30/2010    Colonoscopy: Not documented   Colonoscopy action/deferral: GI referral  (05/29/2009)  Other Screening   PSA: Not documented   PSA action/deferral: Discussed-PSA declined  (05/29/2009)   Smoking status: never  (06/19/2009)  Diabetes Mellitus   HgbA1C: 12.4  (05/29/2009)    Eye exam: normal  (09/15/2008)   Eye exam due: 09/15/2009    Foot exam: yes  (06/19/2009)   Foot exam action/deferral: Do today   High risk foot: Yes  (05/29/2009)   Foot care education: Done  (05/29/2009)   Foot exam due: 09/15/2009    Urine microalbumin/creatinine ratio: 5.7  (06/01/2009)    Diabetes flowsheet reviewed?: Yes   Progress toward A1C goal: Unchanged  Lipids   Total Cholesterol: 145  (06/01/2009)   Lipid panel action/deferral: Lipid Panel ordered   LDL: 81  (06/01/2009)   LDL Direct: Not documented   HDL: 54  (06/01/2009)   Triglycerides: 49  (06/01/2009)   Lipid panel due: 11/29/2009  Hypertension   Last Blood Pressure: 146 / 85  (06/19/2009)   Serum creatinine: 0.91  (06/01/2009)   Serum potassium 4.3  (06/01/2009)    Hypertension flowsheet reviewed?: Yes   Progress toward BP goal: Unchanged  Self-Management Support :   Personal Goals (by the next clinic visit) :     Personal A1C goal: 7  (05/29/2009)     Personal blood pressure goal: 130/80  (05/29/2009)     Personal LDL goal: 100  (05/29/2009)    Diabetes self-management support: Written self-care plan  (06/19/2009)   Diabetes care plan printed   Last diabetes self-management training by diabetes educator: 06/07/2009    Hypertension self-management support: Written self-care plan  (06/19/2009)   Hypertension self-care plan printed.

## 2010-04-16 NOTE — Progress Notes (Signed)
  Phone Note Other Incoming   Request: Send information Summary of Call: Request for records received from [facility]. Request forwarded to [facility].     

## 2010-04-18 NOTE — Miscellaneous (Signed)
Summary: REHAB-DISCHARGE SUMMARY  REHAB-DISCHARGE SUMMARY   Imported By: Margie Billet 04/12/2010 16:24:39  _____________________________________________________________________  External Attachment:    Type:   Image     Comment:   External Document

## 2010-04-19 NOTE — Miscellaneous (Signed)
Summary: INITIAL SUMMARY PT SERVICE  INITIAL SUMMARY PT SERVICE   Imported By: Shon Hough 02/05/2010 13:32:02  _____________________________________________________________________  External Attachment:    Type:   Image     Comment:   External Document

## 2010-05-31 ENCOUNTER — Other Ambulatory Visit: Payer: Self-pay | Admitting: Internal Medicine

## 2010-05-31 LAB — GLUCOSE, CAPILLARY

## 2010-06-04 LAB — CBC
Hemoglobin: 13 g/dL (ref 13.0–17.0)
MCHC: 34.6 g/dL (ref 30.0–36.0)
MCHC: 35.4 g/dL (ref 30.0–36.0)
RBC: 4.38 MIL/uL (ref 4.22–5.81)
RBC: 4.87 MIL/uL (ref 4.22–5.81)

## 2010-06-04 LAB — LACTIC ACID, PLASMA: Lactic Acid, Venous: 3.4 mmol/L — ABNORMAL HIGH (ref 0.5–2.2)

## 2010-06-04 LAB — BASIC METABOLIC PANEL
CO2: 24 mEq/L (ref 19–32)
Calcium: 8.3 mg/dL — ABNORMAL LOW (ref 8.4–10.5)
Creatinine, Ser: 0.88 mg/dL (ref 0.4–1.5)
GFR calc non Af Amer: >60 mL/min (ref >60)
Sodium: 134 mEq/L — ABNORMAL LOW (ref 135–145)

## 2010-06-04 LAB — GLUCOSE, CAPILLARY
Glucose-Capillary: 183 mg/dL — ABNORMAL HIGH (ref 70–99)
Glucose-Capillary: 229 mg/dL — ABNORMAL HIGH (ref 70–99)
Glucose-Capillary: 235 mg/dL — ABNORMAL HIGH (ref 70–99)
Glucose-Capillary: 241 mg/dL — ABNORMAL HIGH (ref 70–99)
Glucose-Capillary: 332 mg/dL — ABNORMAL HIGH (ref 70–99)

## 2010-06-04 LAB — COMPREHENSIVE METABOLIC PANEL
ALT: 19 U/L (ref 0–53)
AST: 25 U/L (ref 0–37)
CO2: 26 mEq/L (ref 19–32)
Calcium: 9.1 mg/dL (ref 8.4–10.5)
GFR calc Af Amer: >60 mL/min (ref >60)
GFR calc non Af Amer: >60 mL/min (ref >60)
Potassium: 3.4 mEq/L — ABNORMAL LOW (ref 3.5–5.1)
Sodium: 136 mEq/L (ref 135–145)
Total Protein: 6.7 g/dL (ref 6.0–8.3)

## 2010-06-04 LAB — TYPE AND SCREEN
ABO/RH(D): A POS
Antibody Screen: NEGATIVE

## 2010-06-04 LAB — POCT I-STAT, CHEM 8
HCT: 43 % (ref 39.0–52.0)
Hemoglobin: 14.6 g/dL (ref 13.0–17.0)
Sodium: 138 mEq/L (ref 135–145)
TCO2: 24 mmol/L (ref 0–100)

## 2010-06-04 LAB — ABO/RH: ABO/RH(D): A POS

## 2010-06-09 LAB — GLUCOSE, CAPILLARY: Glucose-Capillary: 301 mg/dL — ABNORMAL HIGH (ref 70–99)

## 2010-06-18 ENCOUNTER — Ambulatory Visit (INDEPENDENT_AMBULATORY_CARE_PROVIDER_SITE_OTHER): Payer: Medicaid Other | Admitting: Internal Medicine

## 2010-06-18 ENCOUNTER — Encounter: Payer: Self-pay | Admitting: Internal Medicine

## 2010-06-18 DIAGNOSIS — R5383 Other fatigue: Secondary | ICD-10-CM

## 2010-06-18 DIAGNOSIS — I10 Essential (primary) hypertension: Secondary | ICD-10-CM

## 2010-06-18 DIAGNOSIS — K279 Peptic ulcer, site unspecified, unspecified as acute or chronic, without hemorrhage or perforation: Secondary | ICD-10-CM

## 2010-06-18 DIAGNOSIS — G894 Chronic pain syndrome: Secondary | ICD-10-CM

## 2010-06-18 DIAGNOSIS — E119 Type 2 diabetes mellitus without complications: Secondary | ICD-10-CM

## 2010-06-18 DIAGNOSIS — R5381 Other malaise: Secondary | ICD-10-CM

## 2010-06-18 DIAGNOSIS — K5900 Constipation, unspecified: Secondary | ICD-10-CM
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
## 2010-06-18 NOTE — Patient Instructions (Signed)
Please, stop taking naprosyn and cyclobenzaprine. Please, call for results on your blood test in 1 week. Please, call with any questions and follow up in 6 months or sooner.

## 2010-06-19 DIAGNOSIS — R5383 Other fatigue: Secondary | ICD-10-CM | POA: Insufficient documentation

## 2010-06-19 DIAGNOSIS — K279 Peptic ulcer, site unspecified, unspecified as acute or chronic, without hemorrhage or perforation: Secondary | ICD-10-CM | POA: Insufficient documentation

## 2010-06-19 NOTE — Assessment & Plan Note (Signed)
Patient had an EGD ->H. Pylori negative; Bx neg for malignancy. Will stop Naproxen. Instructed to avoid use of NSAIDS.

## 2010-06-19 NOTE — Assessment & Plan Note (Signed)
Excellent control. Patient is compliant with his diet mand medication regimen.  Foot exam WNL.

## 2010-06-19 NOTE — Assessment & Plan Note (Signed)
Will stop flexeryl and order TSH level.

## 2010-06-19 NOTE — Assessment & Plan Note (Signed)
Likely diet-related. Instructed to increase fluid and fiber intake. Will order TSH to r/o hypothyroidism as a cause.

## 2010-06-25 ENCOUNTER — Encounter: Payer: Self-pay | Admitting: Internal Medicine

## 2010-06-25 ENCOUNTER — Other Ambulatory Visit: Payer: Self-pay | Admitting: Internal Medicine

## 2010-07-23 ENCOUNTER — Ambulatory Visit: Payer: Medicaid Other | Attending: Orthopedic Surgery | Admitting: Occupational Therapy

## 2010-07-23 DIAGNOSIS — M6281 Muscle weakness (generalized): Secondary | ICD-10-CM | POA: Insufficient documentation

## 2010-07-23 DIAGNOSIS — M25549 Pain in joints of unspecified hand: Secondary | ICD-10-CM | POA: Insufficient documentation

## 2010-07-23 DIAGNOSIS — IMO0001 Reserved for inherently not codable concepts without codable children: Secondary | ICD-10-CM | POA: Insufficient documentation

## 2010-07-29 ENCOUNTER — Ambulatory Visit: Payer: Medicaid Other | Admitting: Occupational Therapy

## 2010-07-30 NOTE — Op Note (Signed)
Mitchell [name], Mitchell [name]            ACCOUNT NO.:  [id number]   MEDICAL RECORD NO.:  [id number]          PATIENT TYPE:  OIB   LOCATION:  5727                         FACILITY:  [facility]   PHYSICIAN:  Coletta Memos, M.D.     DATE OF BIRTH:  25-Jun-1955   DATE OF PROCEDURE:  10/28/2006  DATE OF DISCHARGE:  10/29/2006                               OPERATIVE REPORT   PREOPERATIVE DIAGNOSIS:  Displaced disc, right L4-L5.   POSTOPERATIVE DIAGNOSIS:  Displaced disc, right L4-L5, right L4-L5  radiculopathy.   PROCEDURE:  Right L4-L5 hemilaminectomy and discectomy with  microdissection.   COMPLICATIONS:  None.   SURGEON:  Coletta Memos, M.D.   ASSISTANT:  Hewitt Shorts, M.D.   INDICATIONS:  Mitchell [name] presented with pain and weakness in the right  dorsiflexors.  MRI showed a herniated disc at L4-L5 on the right side.  I recommended and he agreed to undergo operative decompression after his  foot did not get any stronger.   DESCRIPTION OF PROCEDURE:  Mitchell [name] was brought to the operating  room, intubated, and placed under general anesthetic without difficulty.  His back was prepped and he was draped in a sterile fashion.  I opened  the skin with a #10 blade and took this down to the thoracolumbar  fascia.  I then exposed the lamina of L4 and L5.  Intraoperative x-ray  showed I was in the correct interlaminar space after I placed a double  ended ganglion knife inferior to superior lamina exposing my wound.  I  performed a hemilaminectomy using a high speed drill at L4.  I removed  the ligamentum flavum and exposed the thecal sac.  I was able to  identified the disc space.  I used bipolar cautery to cauterize vein. I  then divided them sharply.  I then opened the disc space with a #15  blade and with Dr. Earl Gala assistance, started to take disc material.  We used microdissection during this period of time to make sure that  there was no harm that came to the surrounding nerve roots.   This was  taken out in a piecemeal fashion using both curets, pituitary rongeurs,  and a Kerrison punch.  After thorough decompression of the L4 root was  obtained, I then irrigated the wound.  I then closed the wound in a  layered fashion with Dr. Earl Gala assistance.  I used Vicryl sutures  to reapproximate the thoracolumbar fascia subcutaneously and  subcuticular layers.  Dermabond was used for a sterile dressing.  Mr.  [name] tolerated the procedure well.           ______________________________  Coletta Memos, M.D.     KC/MEDQ  D:  10/28/2006  T:  10/29/2006  Job:  [id number]

## 2010-08-07 ENCOUNTER — Ambulatory Visit: Payer: Medicaid Other | Admitting: Occupational Therapy

## 2010-08-14 ENCOUNTER — Ambulatory Visit: Payer: Medicaid Other | Admitting: Occupational Therapy

## 2010-12-19 ENCOUNTER — Telehealth: Payer: Self-pay | Admitting: *Deleted

## 2010-12-19 ENCOUNTER — Other Ambulatory Visit: Payer: Self-pay | Admitting: Internal Medicine

## 2010-12-19 DIAGNOSIS — E119 Type 2 diabetes mellitus without complications: Secondary | ICD-10-CM

## 2010-12-20 NOTE — Telephone Encounter (Signed)
error 

## 2010-12-30 ENCOUNTER — Encounter: Payer: Self-pay | Admitting: Internal Medicine

## 2010-12-30 ENCOUNTER — Ambulatory Visit (INDEPENDENT_AMBULATORY_CARE_PROVIDER_SITE_OTHER): Payer: Self-pay | Admitting: Internal Medicine

## 2010-12-30 VITALS — BP 153/87 | HR 71 | Temp 98.0°F | Ht 69.0 in | Wt 157.2 lb

## 2010-12-30 DIAGNOSIS — G894 Chronic pain syndrome: Secondary | ICD-10-CM

## 2010-12-30 DIAGNOSIS — G8929 Other chronic pain: Secondary | ICD-10-CM

## 2010-12-30 DIAGNOSIS — K279 Peptic ulcer, site unspecified, unspecified as acute or chronic, without hemorrhage or perforation: Secondary | ICD-10-CM

## 2010-12-30 DIAGNOSIS — Z9109 Other allergy status, other than to drugs and biological substances: Secondary | ICD-10-CM

## 2010-12-30 DIAGNOSIS — J309 Allergic rhinitis, unspecified: Secondary | ICD-10-CM

## 2010-12-30 DIAGNOSIS — I10 Essential (primary) hypertension: Secondary | ICD-10-CM

## 2010-12-30 DIAGNOSIS — E119 Type 2 diabetes mellitus without complications: Secondary | ICD-10-CM

## 2010-12-30 DIAGNOSIS — Z23 Encounter for immunization: Secondary | ICD-10-CM

## 2010-12-30 LAB — CBC
HCT: 44.3
MCV: 84.5
Platelets: 217
RBC: 5.24
WBC: 5.8

## 2010-12-30 LAB — POCT GLYCOSYLATED HEMOGLOBIN (HGB A1C): Hemoglobin A1C: 7.7

## 2010-12-30 LAB — BASIC METABOLIC PANEL
BUN: 8
Chloride: 106
GFR calc Af Amer: >60
GFR calc non Af Amer: >60
Potassium: 4

## 2010-12-30 MED ORDER — INSULIN GLARGINE 100 UNIT/ML ~~LOC~~ SOLN: 12.0000 [IU] | Freq: Every day | SUBCUTANEOUS | Status: DC

## 2010-12-30 MED ORDER — BUTALBITAL-APAP-CAFFEINE 50-325-40 MG PO TABS: 1.0000 | ORAL_TABLET | Freq: Four times a day (QID) | ORAL | Status: DC | PRN

## 2010-12-30 MED ORDER — TRAMADOL HCL 50 MG PO TABS: 50.0000 mg | ORAL_TABLET | Freq: Four times a day (QID) | ORAL | Status: DC | PRN

## 2010-12-30 MED ORDER — PANTOPRAZOLE SODIUM 40 MG PO TBEC: DELAYED_RELEASE_TABLET | ORAL | Status: DC

## 2010-12-30 MED ORDER — LISINOPRIL 10 MG PO TABS: 10.0000 mg | ORAL_TABLET | Freq: Every day | ORAL | Status: DC

## 2010-12-30 MED ORDER — LORATADINE 10 MG PO TABS: 10.0000 mg | ORAL_TABLET | Freq: Every day | ORAL | Status: DC

## 2010-12-30 MED ORDER — INSULIN PEN NEEDLE 31G X 5 MM MISC: 1.0000 | Status: DC

## 2010-12-30 MED ORDER — METFORMIN HCL 1000 MG PO TABS: 1000.0000 mg | ORAL_TABLET | Freq: Two times a day (BID) | ORAL | Status: DC

## 2010-12-30 MED ORDER — GABAPENTIN 300 MG PO CAPS: 300.0000 mg | ORAL_CAPSULE | Freq: Three times a day (TID) | ORAL | Status: DC

## 2010-12-30 MED ORDER — OMEPRAZOLE 40 MG PO CPDR: 40.0000 mg | DELAYED_RELEASE_CAPSULE | Freq: Every day | ORAL | Status: DC
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
## 2010-12-30 NOTE — Progress Notes (Deleted)
  Subjective:    Patient ID: Mitchell Page, male    DOB: [birth year], 55 y.o.   MRN: [id number]  HPI    Review of Systems     Objective:   Physical Exam        Assessment & Plan:

## 2010-12-30 NOTE — Patient Instructions (Signed)
Please, stop using Novolog insulin (blue pen) Please, increase  Glargine Solostar to 12 Units before bedtime and call with any questions Please, follow up in 4 months or sooner.

## 2010-12-30 NOTE — Progress Notes (Deleted)
Subjective:  Patient ID: Thaddeus Evitts male DOB: 04-06-1955 55 y.o. MRN: [id number]  HPI: Mr.Butch Waddell is a 55 y.o.     Past Medical History  Diagnosis Date  . GERD (gastroesophageal reflux disease)   . Diabetes mellitus   . Lower back pain     w/ R LE sciatica s/p laminectomy in 2008. MRI- 04/2008  . Chronic headache   . Allergic rhinitis   . Hypertension    Current Outpatient Prescriptions  Medication Sig Dispense Refill  . gabapentin (NEURONTIN) 300 MG capsule Take 300 mg by mouth 3 (three) times daily.        . insulin aspart (NOVOLOG FLEXPEN) 100 UNIT/ML injection Inject 4 Units into the skin 3 (three) times daily before meals.        . insulin glargine (LANTUS) 100 UNIT/ML injection Inject 10 Units into the skin at bedtime.        Marland Kitchen lisinopril (PRINIVIL,ZESTRIL) 10 MG tablet Take 10 mg by mouth daily.        . metFORMIN (GLUCOPHAGE) 1000 MG tablet Take 1,000 mg by mouth 2 (two) times daily with a meal.        . omeprazole (PRILOSEC) 40 MG capsule Take 40 mg by mouth daily.        Marland Kitchen PRODIGY MINI PEN NEEDLES 31G X 5 MM MISC USE AS DIRECTED  60 each  11  . PRODIGY NO CODING BLOOD GLUC test strip Use to check blood sugar twice daily - before breakfast and at bedtime  100 each  11  . traMADol (ULTRAM) 50 MG tablet Take one (1) tablet(s) up to 4 timesdaily as needed for backpain  120 tablet  11   No family history on file. History   Social History  . Marital Status: Married    Spouse Name: N/A    Number of Children: N/A  . Years of Education: N/A   Social History Main Topics  . Smoking status: Never Smoker   . Smokeless tobacco: None  . Alcohol Use: No  . Drug Use: No  . Sexually Active: None   Other Topics Concern  . 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   Review of Systems: @NORROS @ Objective: Physical Exam: Filed Vitals:   12/30/10 1436  BP: 153/87  Pulse: 71  Temp: 98 F (36.7 C)  TempSrc: Oral  Height: 5\' 9"  (1.753 m)  Weight: 157 lb 3.2 oz (71.305 kg)   @NORPE @ Assessment & Plan:

## 2010-12-31 LAB — LIPID PANEL
Cholesterol: 135 mg/dL (ref 0–200)
HDL: 55 mg/dL (ref >39)
LDL Cholesterol: 66 mg/dL (ref 0–99)
Triglycerides: 71 mg/dL (ref <150)
VLDL: 14 mg/dL (ref 0–40)

## 2011-04-14 ENCOUNTER — Telehealth: Payer: Self-pay | Admitting: *Deleted

## 2011-04-14 NOTE — Telephone Encounter (Signed)
Pt called to state all fingers feel normal when he first gets up but when he moves he gets less feeling in fingers.  Onset 2 days ago.  He is able to do everything as normal and has no other complaints.    Pt given appointment 2:15 on Wednesday.   Is this okay?

## 2011-04-14 NOTE — Telephone Encounter (Signed)
He is at risk risk for CV disease but his sxs do not appear c/w  TIA/ CVA if only happening as day progressing and then resolving at night. That would be c/w nerve impingement due to some activity like computer typing. Appt in 2 days OK. But if sxs change he needs to go to ER for code stroke.   Pls also make him an appt with Dr Denton Meek for routine DM HTN care in addition to his acute visit in 2 days.  Thanks

## 2011-04-15 NOTE — Telephone Encounter (Signed)
Pt was informed.

## 2011-04-16 ENCOUNTER — Encounter: Payer: Self-pay | Admitting: Internal Medicine

## 2011-04-16 ENCOUNTER — Ambulatory Visit (INDEPENDENT_AMBULATORY_CARE_PROVIDER_SITE_OTHER): Payer: Self-pay | Admitting: Internal Medicine

## 2011-04-16 VITALS — BP 164/94 | HR 88 | Temp 97.6°F | Ht 69.0 in | Wt 157.3 lb

## 2011-04-16 DIAGNOSIS — K219 Gastro-esophageal reflux disease without esophagitis: Secondary | ICD-10-CM

## 2011-04-16 DIAGNOSIS — I10 Essential (primary) hypertension: Secondary | ICD-10-CM

## 2011-04-16 DIAGNOSIS — E119 Type 2 diabetes mellitus without complications: Secondary | ICD-10-CM

## 2011-04-16 DIAGNOSIS — R209 Unspecified disturbances of skin sensation: Secondary | ICD-10-CM

## 2011-04-16 DIAGNOSIS — G8929 Other chronic pain: Secondary | ICD-10-CM

## 2011-04-16 DIAGNOSIS — G894 Chronic pain syndrome: Secondary | ICD-10-CM

## 2011-04-16 DIAGNOSIS — IMO0002 Reserved for concepts with insufficient information to code with codable children: Secondary | ICD-10-CM

## 2011-04-16 DIAGNOSIS — K279 Peptic ulcer, site unspecified, unspecified as acute or chronic, without hemorrhage or perforation: Secondary | ICD-10-CM

## 2011-04-16 DIAGNOSIS — Z9109 Other allergy status, other than to drugs and biological substances: Secondary | ICD-10-CM

## 2011-04-16 MED ORDER — WAVESENSE AMP W/DEVICE KIT: PACK | Status: DC

## 2011-04-16 MED ORDER — LISINOPRIL 10 MG PO TABS: 20.0000 mg | ORAL_TABLET | Freq: Every day | ORAL | Status: DC

## 2011-04-16 MED ORDER — LORATADINE 10 MG PO TABS: 10.0000 mg | ORAL_TABLET | Freq: Every day | ORAL | Status: DC

## 2011-04-16 MED ORDER — INSULIN GLARGINE 100 UNIT/ML ~~LOC~~ SOLN: 12.0000 [IU] | Freq: Every day | SUBCUTANEOUS | Status: DC

## 2011-04-16 MED ORDER — BUTALBITAL-APAP-CAFFEINE 50-325-40 MG PO TABS: 1.0000 | ORAL_TABLET | Freq: Four times a day (QID) | ORAL | Status: DC | PRN

## 2011-04-16 MED ORDER — TRAMADOL HCL 50 MG PO TABS: 50.0000 mg | ORAL_TABLET | Freq: Four times a day (QID) | ORAL | Status: DC | PRN

## 2011-04-16 MED ORDER — GLUCOSE BLOOD VI STRP: ORAL_STRIP | Status: DC

## 2011-04-16 MED ORDER — DEXLANSOPRAZOLE 30 MG PO CPDR: 30.0000 mg | DELAYED_RELEASE_CAPSULE | Freq: Every day | ORAL | Status: AC

## 2011-04-16 MED ORDER — GABAPENTIN 300 MG PO CAPS: 300.0000 mg | ORAL_CAPSULE | Freq: Three times a day (TID) | ORAL | Status: DC

## 2011-04-16 NOTE — Assessment & Plan Note (Addendum)
A1c today of 7.8 is similar to prior of 7.7. The patient says he was recently told to stop taking NovoLog with meals, now taking only Lantus 12 units daily along with metformin. I don't want to change his regimen right after someone already did. We ordered him a new meter today because his meter was becoming too old. I referred him to Tobey Bride. to learn how to use the new meter. He should followup with his PCP, as he already has a scheduled appointment, for any diabetic med changes. - BMP today - Consider starting baby aspirin - On lisinopril, BP still slight elevated. Increase lisinopril from 10-20 today. - Lipid panel from October 2012 was excellent Lab Results  Component Value Date   HGBA1C 7.8 04/16/2011   HGBA1C 7.7 12/30/2010   HGBA1C 7.0 06/18/2010   Lab Results  Component Value Date   MICROALBUR 0.50 06/01/2009   LDLCALC 66 12/30/2010   CREATININE 0.83 11/16/2009

## 2011-04-16 NOTE — Assessment & Plan Note (Signed)
As per history of present illness, patient complains of numbness and tingling in all 10 fingers, typically upon waking up in the morning with relief upon moving. It was only for a couple of days, now it is much improved. He denies any mouth tingling or numbness tingling or weakness in any other extremities. The differential includes diabetic nephropathy, electrolyte abnormality, or less likely some kind of central spinal pathology that would affect the bilateral hands. His presentation is not consistent with carpal tunnel. Notably, he was supposed to have gotten gabapentin but never did. - Gabapentin 300 3 times a day

## 2011-04-16 NOTE — Assessment & Plan Note (Signed)
Patient continues to used excellent on a PRN basis.

## 2011-04-16 NOTE — Progress Notes (Signed)
Subjective:     Patient ID: Mitchell Page, male   DOB: April 22, 1955, 56 y.o.   MRN: [id number]  HPI Patient is a pleasant 56 year old gentleman with diabetes, hypertension, GERD who presents with bilateral finger tingling. Patient says that over the weekend he experienced numbness and tingling in all tenderness finger simultaneously, typically in the morning upon waking up. He said that as he started moving his hands and fingers at the feelings would subside. The past day or 2 have been not as bad. He denies any numbness tingling or weakness in any other limbs, including his feet. He denies any perioral tingling. He denies any unilateral symptoms. He denies any changes in vision, changes in mentation, or difficulty walking. Overall, he is doing very well.  Notably, he was given a prescription for gabapentin last visit, but says that when [facility] it that it was unavailable at that time. He says he is not since gotten it.  Review of Systems As per hpi    Objective:   Physical Exam GEN: NAD.  Alert and oriented x 3.  Pleasant, conversant, and cooperative to exam. Hands: normal sensation in b/l hands.  No atrophy, no TTP, erythema, or edema.  Normal appearing hands. Feet: 1+ pulses on L, 2+ pulses on R.  No visible lesions.     Assessment:         Plan:

## 2011-04-16 NOTE — Assessment & Plan Note (Signed)
Increased lisinopril from 10-20 today.

## 2011-04-17 LAB — BASIC METABOLIC PANEL
Calcium: 9.6 mg/dL (ref 8.4–10.5)
Glucose, Bld: 117 mg/dL — ABNORMAL HIGH (ref 70–99)
Potassium: 4 mEq/L (ref 3.5–5.3)
Sodium: 137 mEq/L (ref 135–145)

## 2011-04-17 NOTE — Progress Notes (Signed)
agree

## 2011-04-18 ENCOUNTER — Other Ambulatory Visit: Payer: Self-pay | Admitting: Internal Medicine

## 2011-04-18 DIAGNOSIS — I10 Essential (primary) hypertension: Secondary | ICD-10-CM

## 2011-04-18 MED ORDER — INSULIN GLARGINE 100 UNIT/ML ~~LOC~~ SOLN: 12.0000 [IU] | Freq: Every day | SUBCUTANEOUS | Status: DC

## 2011-04-18 MED ORDER — QUINAPRIL HCL 20 MG PO TABS: 20.0000 mg | ORAL_TABLET | Freq: Every day | ORAL | Status: DC

## 2011-04-30 ENCOUNTER — Other Ambulatory Visit: Payer: Self-pay | Admitting: Internal Medicine

## 2011-04-30 ENCOUNTER — Telehealth: Payer: Self-pay | Admitting: *Deleted

## 2011-04-30 NOTE — Telephone Encounter (Signed)
Message copied by Hassan Buckler on Wed Apr 30, 2011 10:16 AM ------      Message from: Denna Haggard      Created: Wed Apr 30, 2011  9:58 AM       Keep the patient on Dexlansoprazole. D/C Protonix and omeprazole. Thank you.      ----- Message -----         From: Hassan Buckler, RN         Sent: 04/24/2011  12:11 PM           To: Deatra Robinson, MD            Hi Dr. Denton Meek      Received fax from [facility]. Stated pt is already on Protonix to replace Omeprazole.  Then 1/30, Dr. Berlinda Last prescribed Dexlansoprazole 30mg . They want to know which med should pt be on?  I called the pt., he did not understand either;stated he was taken off 2 meds and put on 1 med.      Please advise.

## 2011-04-30 NOTE — Telephone Encounter (Signed)
[facility] was called and message left to keep pt on Dexlansoprazole per Dr. Denton Meek.

## 2011-05-27 ENCOUNTER — Ambulatory Visit: Payer: Self-pay | Admitting: Dietician

## 2011-05-27 ENCOUNTER — Ambulatory Visit (INDEPENDENT_AMBULATORY_CARE_PROVIDER_SITE_OTHER): Payer: Self-pay | Admitting: Internal Medicine

## 2011-05-27 ENCOUNTER — Encounter: Payer: Self-pay | Admitting: Internal Medicine

## 2011-05-27 VITALS — BP 148/91 | HR 76 | Temp 97.9°F | Ht 69.0 in | Wt 156.6 lb

## 2011-05-27 DIAGNOSIS — E119 Type 2 diabetes mellitus without complications: Secondary | ICD-10-CM

## 2011-05-27 LAB — GLUCOSE, CAPILLARY: Glucose-Capillary: 90 mg/dL (ref 70–99)

## 2011-05-27 NOTE — Patient Instructions (Signed)
Please, fill in your prescriptions and take them as prescribed. Have a safe trip back home and please follow up with [place] in 3 months.

## 2011-05-27 NOTE — Progress Notes (Signed)
Patient ID: Mitchell Page, male   DOB: [birth year], 56 y.o.   MRN: [id number]  Subjective:   Patient ID: Mitchell Page male   DOB: 1955-04-02 56 y.o.   MRN: [id number]  HPI: Mitchell Page is a 56 y.o.  Man is here for: 1. DM. Denies any hypoglycemia. Checks his cbgs bid. Requests refills on his meds for 3 months due to a 3 months visit to his home country. Past Medical History  Diagnosis Date  . GERD (gastroesophageal reflux disease)   . Diabetes mellitus   . Lower back pain     w/ R LE sciatica s/p laminectomy in 2008. MRI- 04/2008  . Chronic headache   . Allergic rhinitis   . Hypertension    Current Outpatient Prescriptions  Medication Sig Dispense Refill  . Blood Glucose Monitoring Suppl (WAVESENSE AMP) W/DEVICE KIT 1 meter, use to measure blood glucose 3 times daily  1 kit  0  . butalbital-acetaminophen-caffeine (FIORICET) 50-325-40 MG per tablet Take 1-2 tablets by mouth every 6 (six) hours as needed for headache.  20 tablet  1  . gabapentin (NEURONTIN) 300 MG capsule Take 1 capsule (300 mg total) by mouth 3 (three) times daily.  90 capsule  11  . glucose blood test strip Use as instructed  100 each  12  . insulin glargine (LANTUS) 100 UNIT/ML injection Inject 12 Units into the skin at bedtime.  10 mL  12  . Insulin Pen Needle (PRODIGY MINI PEN NEEDLES) 31G X 5 MM MISC Inject 1 Device into the skin 1 day or 1 dose.  30 each  11  . loratadine (CLARITIN) 10 MG tablet Take 1 tablet (10 mg total) by mouth daily.  30 tablet  11  . metFORMIN (GLUCOPHAGE) 1000 MG tablet Take 1 tablet (1,000 mg total) by mouth 2 (two) times daily with a meal.  60 tablet  11  . PRODIGY NO CODING BLOOD GLUC test strip Use to check blood sugar twice daily - before breakfast and at bedtime  100 each  11  . quinapril (ACCUPRIL) 20 MG tablet Take 1 tablet (20 mg total) by mouth daily.  30 tablet  11  . traMADol (ULTRAM) 50 MG tablet Take 1 tablet (50 mg total) by mouth every 6 (six) hours as needed for  pain.  120 tablet  11   No family history on file. History   Social History  . Marital Status: Married    Spouse Name: N/A    Number of Children: N/A  . Years of Education: N/A   Social History Main Topics  . Smoking status: Never Smoker   . Smokeless tobacco: None  . Alcohol Use: No  . Drug Use: No  . Sexually Active: None   Other Topics Concern  . 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   Review of Systems: Constitutional: Denies fever, chills, diaphoresis, appetite change and fatigue.  HEENT: Denies photophobia, eye pain, redness, hearing loss, ear pain, congestion, sore throat, rhinorrhea, sneezing, mouth sores, trouble swallowing, neck pain, neck stiffness and tinnitus.   Respiratory: Denies SOB, DOE, cough, chest tightness,  and wheezing.   Cardiovascular: Denies chest pain, palpitations and leg swelling.  Gastrointestinal: Denies nausea, vomiting, abdominal pain, diarrhea, constipation, blood in stool and abdominal distention.  Genitourinary: Denies  dysuria, urgency, frequency, hematuria, flank pain and difficulty urinating.  Musculoskeletal: Denies myalgias, back pain, joint swelling, arthralgias and gait problem.  Skin: Denies pallor, rash and wound.  Neurological: Denies dizziness, seizures, syncope, weakness, light-headedness, numbness and headaches.  Hematological: Denies adenopathy. Easy bruising, personal or family bleeding history  Psychiatric/Behavioral: Denies suicidal ideation, mood changes, confusion, nervousness, sleep disturbance and agitation  Objective:  Physical Exam: Filed Vitals:   05/27/11 1420  BP: 148/91  Pulse: 76  Temp: 97.9 F (36.6 C)  TempSrc: Oral  Height: 5\' 9"  (1.753 m)  Weight: 156 lb 9.6 oz  (71.033 kg)   Constitutional: Vital signs reviewed.  Patient is a well-developed and well-nourished ies in no acute distress and cooperative with exam. Alert and oriented x3.  Head: Normocephalic and atraumatic Ear: TM normal bilaterally Mouth: no erythema or exudates, MMM Eyes: PERRL, EOMI, conjunctivae normal, No scleral icterus.  Neck: Supple, Trachea midline normal ROM, No JVD, mass, thyromegaly, or carotid bruit present.  Cardiovascular: RRR, S1 normal, S2 normal, no MRG, pulses symmetric and intact bilaterally Pulmonary/Chest: CTAB, no wheezes, rales, or rhonchi Abdominal: Soft. Non-tender, non-distended, bowel sounds are normal, no masses, organomegaly, or guarding present.  GU: no CVA tenderness Musculoskeletal: No joint deformities, erythema, or stiffness, ROM full and no nontender Hematology: no cervical, inginal, or axillary adenopathy.  Neurological: A&O x3, Strenght is normal and symmetric bilaterally, cranial nerve II-XII are grossly intact, no focal motor deficit, sensory intact to light touch bilaterally.  Skin: Warm, dry and intact. No rash, cyanosis, or clubbing.  Psychiatric: Normal mood and affect. speech and behavior is normal. Judgment and thought content normal. Cognition and memory are normal.   Assessment & Plan:    1. DM, type 2. -no signs of hypoglycemia -continue with Lantus Solostar pen (patient "loves it.") -diet, exercise, foot care discussed -Rx refilled x 3 months (patient leaves the [place] for 3 months)

## 2011-05-30 ENCOUNTER — Encounter: Payer: Self-pay | Admitting: Internal Medicine

## 2011-06-05 ENCOUNTER — Other Ambulatory Visit: Payer: Self-pay | Admitting: *Deleted

## 2011-06-05 DIAGNOSIS — Z9109 Other allergy status, other than to drugs and biological substances: Secondary | ICD-10-CM

## 2011-06-05 NOTE — Telephone Encounter (Signed)
[facility] needs Rx for clarinex 5 mg 1 daily They have a supply for pt ready. [phone]   Pt going out of town in 2 days.

## 2011-06-06 MED ORDER — LORATADINE 10 MG PO TABS: 10.0000 mg | ORAL_TABLET | Freq: Every day | ORAL | Status: DC

## 2011-06-06 MED ORDER — DESLORATADINE 5 MG PO TBDP: 5.0000 mg | ORAL_TABLET | Freq: Every day | ORAL | Status: DC

## 2011-06-06 NOTE — Telephone Encounter (Signed)
Refill redone for 5 mg dose - nurse to complete.

## 2011-06-06 NOTE — Telephone Encounter (Signed)
Refill approved - nurse to complete. 

## 2011-06-06 NOTE — Telephone Encounter (Signed)
Rx phoned to pharmacy.  

## 2011-08-05 ENCOUNTER — Encounter: Payer: Self-pay | Admitting: Internal Medicine

## 2011-09-23 ENCOUNTER — Telehealth: Payer: Self-pay | Admitting: *Deleted

## 2011-09-23 MED ORDER — INSULIN GLARGINE 100 UNIT/ML ~~LOC~~ SOLN: 12.0000 [IU] | Freq: Every day | SUBCUTANEOUS | Status: DC

## 2011-09-23 NOTE — Telephone Encounter (Signed)
Refill approved - nurse to complete. 

## 2011-09-23 NOTE — Telephone Encounter (Signed)
Fax from [facility]. Need rx for Lantus Solostar - Inject 12 units at bedtime Qty #12ml Med list has pt on vials.  Thanks

## 2011-09-23 NOTE — Telephone Encounter (Signed)
Lantus Solostar rrefilled - rv request form faxed to [facility].

## 2011-10-08 ENCOUNTER — Other Ambulatory Visit: Payer: Self-pay | Admitting: *Deleted

## 2011-10-08 DIAGNOSIS — J309 Allergic rhinitis, unspecified: Secondary | ICD-10-CM

## 2011-10-08 MED ORDER — DESLORATADINE 5 MG PO TBDP: 5.0000 mg | ORAL_TABLET | Freq: Every day | ORAL | Status: DC

## 2011-10-08 NOTE — Telephone Encounter (Signed)
Rx called in to pharmacy. 

## 2012-01-29 ENCOUNTER — Encounter: Payer: Self-pay | Admitting: Internal Medicine

## 2012-01-29 ENCOUNTER — Ambulatory Visit (INDEPENDENT_AMBULATORY_CARE_PROVIDER_SITE_OTHER): Payer: Self-pay | Admitting: Internal Medicine

## 2012-01-29 VITALS — BP 133/86 | HR 77 | Temp 98.3°F | Ht 72.0 in | Wt 157.0 lb

## 2012-01-29 DIAGNOSIS — K279 Peptic ulcer, site unspecified, unspecified as acute or chronic, without hemorrhage or perforation: Secondary | ICD-10-CM

## 2012-01-29 DIAGNOSIS — Z299 Encounter for prophylactic measures, unspecified: Secondary | ICD-10-CM

## 2012-01-29 DIAGNOSIS — E119 Type 2 diabetes mellitus without complications: Secondary | ICD-10-CM

## 2012-01-29 DIAGNOSIS — J309 Allergic rhinitis, unspecified: Secondary | ICD-10-CM

## 2012-01-29 DIAGNOSIS — G8929 Other chronic pain: Secondary | ICD-10-CM

## 2012-01-29 DIAGNOSIS — Z79899 Other long term (current) drug therapy: Secondary | ICD-10-CM

## 2012-01-29 DIAGNOSIS — I10 Essential (primary) hypertension: Secondary | ICD-10-CM

## 2012-01-29 DIAGNOSIS — Z23 Encounter for immunization: Secondary | ICD-10-CM

## 2012-01-29 DIAGNOSIS — Z9109 Other allergy status, other than to drugs and biological substances: Secondary | ICD-10-CM

## 2012-01-29 DIAGNOSIS — R51 Headache: Secondary | ICD-10-CM

## 2012-01-29 MED ORDER — INSULIN GLARGINE 100 UNIT/ML ~~LOC~~ SOLN: 12.0000 [IU] | Freq: Every day | SUBCUTANEOUS | Status: DC

## 2012-01-29 MED ORDER — BUTALBITAL-APAP-CAFFEINE 50-325-40 MG PO TABS: 1.0000 | ORAL_TABLET | Freq: Four times a day (QID) | ORAL | Status: DC | PRN

## 2012-01-29 MED ORDER — METFORMIN HCL 1000 MG PO TABS: 1000.0000 mg | ORAL_TABLET | Freq: Two times a day (BID) | ORAL | Status: DC

## 2012-01-29 MED ORDER — GLUCOSE BLOOD VI STRP: ORAL_STRIP | Status: DC

## 2012-01-29 MED ORDER — INSULIN PEN NEEDLE 31G X 5 MM MISC: 1.0000 | Status: DC

## 2012-01-29 MED ORDER — DESLORATADINE 5 MG PO TBDP: 5.0000 mg | ORAL_TABLET | Freq: Every day | ORAL | Status: DC

## 2012-01-29 NOTE — Addendum Note (Signed)
Addended by: Bufford Spikes on: 01/29/2012 02:56 PM   Modules accepted: Orders

## 2012-01-29 NOTE — Assessment & Plan Note (Signed)
Stable chronic headache. Not any different from previous headaches. Continue Fioricet which helps patient's headache. Refilled that.

## 2012-01-29 NOTE — Assessment & Plan Note (Signed)
Lab Results  Component Value Date   NA 137 04/16/2011   K 4.0 04/16/2011   CL 104 04/16/2011   CO2 25 04/16/2011   BUN 10 04/16/2011   CREATININE 0.93 04/16/2011   CREATININE 0.83 11/16/2009    BP Readings from Last 3 Encounters:  01/29/12 133/86  05/27/11 148/91  04/16/11 164/94    Assessment: Hypertension control:  controlled  Progress toward goals:  improved Barriers to meeting goals:  no barriers identified  Plan: Hypertension treatment:  continue current medications. Continue quinapril.

## 2012-01-29 NOTE — Patient Instructions (Addendum)
Please make followup appointment in 3-4 months for diabetes.  Continue lantus at 12 units at bedtime and Metformin  Take all other medications regularly. You have all the refills up-to-date.

## 2012-01-29 NOTE — Assessment & Plan Note (Addendum)
Lab Results  Component Value Date   HGBA1C 8.6 01/29/2012   HGBA1C 7.2 11/16/2009   CREATININE 0.93 04/16/2011   CREATININE 0.83 11/16/2009   MICROALBUR 0.50 06/01/2009   MICRALBCREAT 5.7 06/01/2009   CHOL 135 12/30/2010   HDL 55 12/30/2010   TRIG 71 12/30/2010    Last eye exam and foot exam:    Component Value Date/Time   HMDIABEYEEXA mild non-proliferative diabetic retinopathy 11/29/2009   HMDIABFOOTEX normal 01/22/2010    Assessment: Diabetes control: not controlled Progress toward goals: deteriorated Barriers to meeting goals: no barriers identified  Plan: Get lipid panel today. Diabetes treatment: continue current medications Continue Lantus at 12 units at bedtime and metformin 1000 mg twice daily. Will not increase dose of Lantus as there are few low readings. Encouraged him for better diet control. Refer to: Ophthalmologist Instruction/counseling given: reminded to get eye exam, discussed foot care and discussed diet

## 2012-01-29 NOTE — Progress Notes (Signed)
  Subjective:    Patient ID: Mitchell Page, male    DOB: 19-Jan-1956, 56 y.o.   MRN: [id number]  HPI patient is a pleasant 89 Erdman with DM 2, hypertension, chronic headache and other medical problems as per problem list who comes to the clinic for six-month checkup and medication refill.  He was back in his native place in [place] because his dad was sick. He just recently came back. He was out of medications and so wanted refills. He still has chronic headaches which get better with Fioricet and he needs refill for that. He denies any new symptoms. Denies any fever, chills, nausea, vomiting, abdominal pain, chest pain, short of breath, diarrhea.  History of an A1c today is 8.6 which is slightly worse than 7.8 last time.  Although his glucose meter shows some low readings.  He also reports change in vision. He sees things bent. Will refer him to ophthalmologist for eye exam.  Review of Systems    as per history of present illness. Objective:   Physical Exam  General: NAD HEENT: PERRL, EOMI, no scleral icterus Cardiac: RRR, no rubs, murmurs or gallops Pulm: clear to auscultation bilaterally, moving normal volumes of air Abd: soft, nontender, nondistended, BS present Ext: warm and well perfused, no pedal edema Neuro: alert and oriented X3, cranial nerves II-XII grossly intact       Assessment & Plan:

## 2012-02-18 ENCOUNTER — Other Ambulatory Visit: Payer: Self-pay | Admitting: *Deleted

## 2012-02-18 DIAGNOSIS — G8929 Other chronic pain: Secondary | ICD-10-CM

## 2012-02-18 DIAGNOSIS — G894 Chronic pain syndrome: Secondary | ICD-10-CM

## 2012-02-18 DIAGNOSIS — I10 Essential (primary) hypertension: Secondary | ICD-10-CM

## 2012-02-18 DIAGNOSIS — Z9109 Other allergy status, other than to drugs and biological substances: Secondary | ICD-10-CM

## 2012-02-18 DIAGNOSIS — K279 Peptic ulcer, site unspecified, unspecified as acute or chronic, without hemorrhage or perforation: Secondary | ICD-10-CM

## 2012-02-18 DIAGNOSIS — E119 Type 2 diabetes mellitus without complications: Secondary | ICD-10-CM

## 2012-02-18 MED ORDER — GABAPENTIN 300 MG PO CAPS: 300.0000 mg | ORAL_CAPSULE | Freq: Three times a day (TID) | ORAL | Status: DC

## 2012-02-18 MED ORDER — INSULIN PEN NEEDLE 31G X 5 MM MISC: Status: DC

## 2012-02-18 NOTE — Telephone Encounter (Signed)
Refills approved - nurse to call in. 

## 2012-02-19 ENCOUNTER — Telehealth: Payer: Self-pay | Admitting: *Deleted

## 2012-02-19 NOTE — Telephone Encounter (Signed)
[facility] called for refill on accupril 20 mg as pt has been out since march.   Pt BP was 133/86 at last visit  Pt called and I understand him to say he has not been using accupril, he only had 20. Pt is hard to understand, [facility] verified they have not filled for him.  I talked with Dr Meredith Pel and he would like pt to be seen next week and bring in all medications.  We will do a med rec at that time. Will hold refill of accupril for now.

## 2012-02-19 NOTE — Telephone Encounter (Signed)
[facility] does not carry neurontin.  [facility]. Pt aware

## 2012-02-20 ENCOUNTER — Telehealth: Payer: Self-pay | Admitting: *Deleted

## 2012-02-20 NOTE — Telephone Encounter (Signed)
Agree with plan 

## 2012-02-20 NOTE — Telephone Encounter (Signed)
Call pt.  Dr Meredith Pel requested pt to schedule an appt next week and to bring all meds. Appt has been scheduled 02/23/12 w/Dr Saralyn Pilar.

## 2012-02-23 ENCOUNTER — Encounter: Payer: Self-pay | Admitting: Internal Medicine

## 2012-02-23 ENCOUNTER — Ambulatory Visit (INDEPENDENT_AMBULATORY_CARE_PROVIDER_SITE_OTHER): Payer: Self-pay | Admitting: Internal Medicine

## 2012-02-23 VITALS — BP 160/90 | HR 95 | Temp 95.0°F | Ht 72.0 in | Wt 162.0 lb

## 2012-02-23 DIAGNOSIS — Z550 Illiteracy and low-level literacy: Secondary | ICD-10-CM | POA: Insufficient documentation

## 2012-02-23 DIAGNOSIS — E119 Type 2 diabetes mellitus without complications: Secondary | ICD-10-CM

## 2012-02-23 DIAGNOSIS — I10 Essential (primary) hypertension: Secondary | ICD-10-CM

## 2012-02-23 LAB — GLUCOSE, CAPILLARY: Glucose-Capillary: 152 mg/dL — ABNORMAL HIGH (ref 70–99)

## 2012-02-23 LAB — LIPID PANEL
HDL: 51 mg/dL (ref >39)
LDL Cholesterol: 66 mg/dL (ref 0–99)

## 2012-02-23 LAB — COMPREHENSIVE METABOLIC PANEL
ALT: 25 U/L (ref 0–53)
Albumin: 4.2 g/dL (ref 3.5–5.2)
Alkaline Phosphatase: 49 U/L (ref 39–117)
CO2: 25 mEq/L (ref 19–32)
Potassium: 4.3 mEq/L (ref 3.5–5.3)
Sodium: 138 mEq/L (ref 135–145)
Total Bilirubin: 0.6 mg/dL (ref 0.3–1.2)
Total Protein: 7.5 g/dL (ref 6.0–8.3)

## 2012-02-23 NOTE — Assessment & Plan Note (Signed)
Pertinent Data: BP Readings from Last 3 Encounters:  02/23/12 160/90  01/29/12 133/86  05/27/11 148/91    Basic Metabolic Panel:    Component Value Date/Time   NA 137 04/16/2011 1433   K 4.0 04/16/2011 1433   CL 104 04/16/2011 1433   CO2 25 04/16/2011 1433   BUN 10 04/16/2011 1433   CREATININE 0.93 04/16/2011 1433   CREATININE 0.83 11/16/2009 1849   GLUCOSE 117* 04/16/2011 1433   CALCIUM 9.6 04/16/2011 1433    Assessment: Disease Control: moderately elevated  Progress toward goals: deteriorated  Barriers to meeting goals: nonadherence to medications, lack of understanding of disease management and he did not understand the instructions on this medication bottle.    He has not been regularly taking his Accupril at least since March 2013.    Plan:  Restart Accupril  Check BMET today, and will need to repeat at next visit.  Educational resources provided:    Self management tools provided:

## 2012-02-23 NOTE — Progress Notes (Signed)
Patient: Mitchell Page   MRN: [id number]  DOB: 10/27/1955    Subjective:    HPI: Mr. Mitchell Page is a 56 y.o. male with a PMHx of DM2, HTN, chronic low back pain who presented to clinic today for the following:  1) DM2, uncontrolled -  Lab Results  Component Value Date   HGBA1C 8.6 01/29/2012   Patient checking blood sugars inconsistently, only if he is feeling poorly. Currently taking Lantus 12 and Metformin 1000mg  (is supposed to be on Metformin 1000mg  BID - but did not understand instruction). States that his average blood sugars vary quite a bit depending upon when he checks. He can have blood sugars of the 110s and otherwise to the 160 to 180s. Patient misses doses 0 x per week on average.  0 hypoglycemic episodes since last visit. Denies assisted hypoglycemia or recently hospitalizations for either hyper or hypoglycemia. denies polyuria, polydipsia, nausea, vomiting, diarrhea.  does not request refills today.  Of note, the patient is on gabapentin TID as well, which he has been only taking once daily.  In regards to diabetic complications:  Microvascular complications: Confirms: peripheral neuropathy ; Denies nephropathy, retinopathy and autonomic neuropathy.  Macrovascular complications: Confirms: none ; Denies cardiovascular disease, cerebrovascular disease and peripheral vascular disease.   Important diabetic medications: Is patient on aspirin? No Is patient on a statin? No Is patient on an ACE-I/ ARB? Yes (well, he is supposed to be, but was unclear of this, therefore, is not taking)  2) HTN - Patient does not check blood pressure regularly at home. Currently not taking his Accupril (seems to have been last refilled in 05/2011). Patient states he is inconsistently taking this medication. denies headaches, dizziness, lightheadedness, chest pain, shortness of breath.  does request refills today.  3) Medication reconciliation - patient brought all of his medication bottles  with him today. There has been significant confusion regarding which medications he is taking. The patient indicates that he cannot read in English, therefore, he has been taking many of his medications only once daily (although prescribed as twice a day or 3 times a day dosing). His son helps him with written instructions. We reviewed all of his medication bottles, and brought them up to date of the medications listed within Epic.     Review of Systems: Per HPI.   Current Outpatient Medications: Medication Sig  . Blood Glucose Monitoring Suppl (WAVESENSE AMP) W/DEVICE KIT 1 meter, use to measure blood glucose 3 times daily  . butalbital-acetaminophen-caffeine (FIORICET) 50-325-40 MG per tablet Take 1-2 tablets by mouth every 6 (six) hours as needed for headache.  . desloratadine (CLARINEX REDITAB) 5 MG disintegrating tablet Take 1 tablet (5 mg total) by mouth daily.  Marland Kitchen gabapentin (NEURONTIN) 300 MG capsule Take 1 capsule (300 mg total) by mouth 3 (three) times daily.  Marland Kitchen glucose blood test strip To check CBG 3 times a day  . insulin glargine (LANTUS SOLOSTAR) 100 UNIT/ML injection Inject 12 Units into the skin at bedtime.  . Insulin Pen Needle (PRODIGY MINI PEN NEEDLES) 31G X 5 MM MISC Use as directed for once daily insulin injection.  . metFORMIN (GLUCOPHAGE) 1000 MG tablet Take 1 tablet (1,000 mg total) by mouth 2 (two) times daily with a meal.  . traMADol (ULTRAM) 50 MG tablet Take 1 tablet (50 mg total) by mouth every 6 (six) hours as needed for pain.  Marland Kitchen quinapril (ACCUPRIL) 20 MG tablet Take 1 tablet (20 mg total) by mouth daily.  Allergies: No Known Allergies   Past Medical History  Diagnosis Date  . GERD (gastroesophageal reflux disease)   . Diabetes mellitus   . Lower back pain     w/ R LE sciatica s/p laminectomy in 2008. MRI- 04/2008  . Chronic headache   . Allergic rhinitis   . Hypertension     Past Surgical History  Procedure Date  . Lumbar laminectomy 2008    l4-5   . Repair of left arm fracture      Objective:    Physical Exam: Filed Vitals:   02/23/12 1529  BP: 160/90  Pulse: 95  Temp: 95 F (35 C)      General: Vital signs reviewed and noted. Well-developed, well-nourished, in no acute distress; alert, appropriate and cooperative throughout examination.  Head: Normocephalic, atraumatic.  Lungs:  Normal respiratory effort. Clear to auscultation BL without crackles or wheezes.  Heart: RRR. S1 and S2 normal without gallop, rubs. No murmur.  Abdomen:  BS normoactive. Soft, Nondistended, non-tender.  No masses or organomegaly.  Extremities: No pretibial edema.    Assessment/ Plan:    Case and plan of care discussed with attending physician, Dr. Jonah Blue.

## 2012-02-23 NOTE — Patient Instructions (Signed)
   General Instructions:  Please follow-up at the clinic in 3 weeks, at which time we will reevaluate YOUR DIABETES, BLOOD PRESSURE - OR, please follow-up in the clinic sooner if needed.  There have been changes in your medications:  RESTART your accupril for your blood pressure.    If you have been started on new medication(s), and you develop symptoms concerning for allergic reaction, including, but not limited to, throat closing, tongue swelling, rash, please stop the medication immediately and call the clinic at [phone], and go to the ER.  If you are diabetic, please bring your meter to your next visit.  If symptoms worsen, or new symptoms arise, please call the clinic or go to the ER.  PLEASE BRING ALL OF YOUR MEDICATIONS  IN A BAG TO YOUR NEXT APPOINTMENT   Treatment Goals:  Goals (1 Years of Data) as of 02/23/2012          As of Today 01/29/12 05/27/11 04/16/11 12/30/10     Blood Pressure    . Blood Pressure < 140/90  160/90 133/86 148/91 164/94 153/87     Result Component    . HEMOGLOBIN A1C < 7.0   8.6  7.8 7.7    . LDL CALC < 100      66      Progress Toward Treatment Goals:  Treatment Goal 02/23/2012  Hemoglobin A1C improved  Blood pressure deteriorated    Self Care Goals & Plans:  Self Care Goal 02/23/2012  Manage my medications take my medicines as prescribed; refill my medications on time  Monitor my health keep track of my blood glucose  Eat healthy foods eat fruit for snacks and desserts; eat more vegetables; eat foods that are low in salt  Be physically active take the stairs instead of the elevator; take a walk every day    Home Blood Glucose Monitoring 02/23/2012  Check my blood sugar once a day  When to check my blood sugar before breakfast     Care Management & Community Referrals:

## 2012-02-23 NOTE — Assessment & Plan Note (Addendum)
Pertinent Labs: Lab Results  Component Value Date   HGBA1C 8.6 01/29/2012   HGBA1C 7.2 11/16/2009   CREATININE 0.93 04/16/2011   CREATININE 0.83 11/16/2009   MICROALBUR 0.50 06/01/2009   MICRALBCREAT 5.7 06/01/2009   CHOL 135 12/30/2010   HDL 55 12/30/2010   TRIG 71 12/30/2010    Assessment: Disease Control: fair control  Progress toward goals: improved  Barriers to meeting goals: patient has been only taking his metformin once daily. He cannot read in English and was unclear of the instructions.  Microvascular complications: peripheral neuropathy  Macrovascular complications: none  On aspirin: No  On statin: Yes  On ACE-I/ ARB: No: did not continue his Accupril regularly.    Patient is compliant most of the time with prescribed medications.   Plan: Glucometer log was not reviewed today, as pt did not have glucometer available for review.   continue current medications - was instructed to increase his metformin to twice daily.  reminded to bring blood glucose meter & log to each visit  Check lipid panel today.  Educational resources provided:    Self management tools provided:    Home glucose monitoring recommendation: once a day before breakfast (was instructed to start checking his blood sugars regularly every day and when necessary)

## 2012-02-24 MED ORDER — QUINAPRIL HCL 20 MG PO TABS: 20.0000 mg | ORAL_TABLET | Freq: Every day | ORAL | Status: DC

## 2012-02-24 NOTE — Addendum Note (Signed)
Addended by: Priscella Mann on: 02/24/2012 03:08 PM   Modules accepted: Orders

## 2012-02-24 NOTE — Addendum Note (Signed)
Addended by: Priscella Mann on: 02/24/2012 12:33 PM   Modules accepted: Orders

## 2012-02-24 NOTE — Progress Notes (Signed)
Quick Note:  LDL at goal. Renal function and hepatic function great. Will restart his ACE-I. Will need to recheck BMET next visit to assess stability of renal function on the ACE-I. ______

## 2012-02-24 NOTE — Addendum Note (Signed)
Addended by: Priscella Mann on: 02/24/2012 12:37 PM   Modules accepted: Orders

## 2012-03-02 ENCOUNTER — Telehealth: Payer: Self-pay | Admitting: *Deleted

## 2012-03-02 NOTE — Telephone Encounter (Signed)
Pt calls and states he needs to speak w/ dr Saralyn Pilar, that she called him and he has question, please use ph# on chart

## 2012-03-02 NOTE — Telephone Encounter (Signed)
Patient just wanted to know if his Accupril had been sent in to [facility], which it has. He had no other questions, he will go pick it up today.  Thank you! - Johnette Abraham, D.O., 03/02/2012, 3:48 PM

## 2012-03-22 ENCOUNTER — Ambulatory Visit: Payer: Self-pay

## 2012-03-30 ENCOUNTER — Ambulatory Visit (INDEPENDENT_AMBULATORY_CARE_PROVIDER_SITE_OTHER): Payer: Self-pay | Admitting: Internal Medicine

## 2012-03-30 ENCOUNTER — Encounter: Payer: Self-pay | Admitting: Internal Medicine

## 2012-03-30 VITALS — BP 150/90 | HR 88 | Temp 98.4°F | Ht 67.5 in | Wt 158.5 lb

## 2012-03-30 DIAGNOSIS — Z550 Illiteracy and low-level literacy: Secondary | ICD-10-CM

## 2012-03-30 DIAGNOSIS — E1140 Type 2 diabetes mellitus with diabetic neuropathy, unspecified: Secondary | ICD-10-CM

## 2012-03-30 DIAGNOSIS — I10 Essential (primary) hypertension: Secondary | ICD-10-CM

## 2012-03-30 DIAGNOSIS — E119 Type 2 diabetes mellitus without complications: Secondary | ICD-10-CM

## 2012-03-30 DIAGNOSIS — Z559 Problems related to education and literacy, unspecified: Secondary | ICD-10-CM

## 2012-03-30 LAB — GLUCOSE, CAPILLARY: Glucose-Capillary: 124 mg/dL — ABNORMAL HIGH (ref 70–99)

## 2012-03-30 NOTE — Assessment & Plan Note (Signed)
Able to identify all medications and correctly report dosage and indication information to me.

## 2012-03-30 NOTE — Assessment & Plan Note (Signed)
Lab Results  Component Value Date   HGBA1C 8.6 01/29/2012   HGBA1C 7.2 11/16/2009   CREATININE 0.87 02/23/2012   CREATININE 0.83 11/16/2009   MICROALBUR 0.50 06/01/2009   MICRALBCREAT 5.7 06/01/2009   CHOL 141 02/23/2012   HDL 51 02/23/2012   TRIG 120 02/23/2012    Last eye exam and foot exam:    Component Value Date/Time   HMDIABEYEEXA mild non-proliferative diabetic retinopathy 11/29/2009   HMDIABFOOTEX normal 01/22/2010    Assessment: Diabetes control: not controlled Progress toward goals: improved Barriers to meeting goals: nonadherence to medications and language  Plan: Diabetes treatment: continue current medications Refer to: none Instruction/counseling given: reminded to get eye exam, reminded to bring blood glucose meter & log to each visit and reminded to bring medications to each visit  Patient reports much improved blood glucose measurements at home since regular compliance with diabetes medications. He will be due for A1c check at next visit, we will see how he has progressed.

## 2012-03-30 NOTE — Assessment & Plan Note (Signed)
Lab Results  Component Value Date   NA 138 02/23/2012   K 4.3 02/23/2012   CL 105 02/23/2012   CO2 25 02/23/2012   BUN 13 02/23/2012   CREATININE 0.87 02/23/2012   CREATININE 0.83 11/16/2009    BP Readings from Last 3 Encounters:  03/30/12 150/90  02/23/12 160/90  01/29/12 133/86    Assessment: Hypertension control:  moderately elevated  Progress toward goals:  unchanged Barriers to meeting goals:  nonadherence to medications  Plan: Hypertension treatment:  continue current medications I do not think this is a medication failure as patient reports that he is intermittently compliant with antihypertensive therapy. Discussed extensively today the importance of daily compliance with therapy. Will see him again in 2 months, and at this visit he is persistently hypertensive will consider adding additional agent.

## 2012-03-30 NOTE — Patient Instructions (Signed)
1. Great job taking your diabetes medications! 2. Take your blood pressure medicine every day.

## 2012-03-30 NOTE — Progress Notes (Signed)
Patient ID: Mitchell Page, male   DOB: 1955/10/12, 57 y.o.   MRN: [id number]  Subjective:   Patient ID: Mitchell Page male   DOB: 11/26/1955 57 y.o.   MRN: [id number]  HPI: [name] is a 57 y.o. male with past medical history type 2 diabetes, chronic lower back pain, chronic headache, and hypertension who presents today for followup of diabetes and high blood pressure. Patient was last seen in the clinic in December by Dr. Saralyn Pilar. At that time, his hemoglobin A1c was noted to be elevated at 8.6, which was increased from his previous value of 7.8. At that visit, Dr. Saralyn Pilar was able to elucidate the patient was not taking diabetic or antihypertensive medications as prescribed due to inability to read English. Since then, he reports compliance with Lantus 12 units each night and metformin 1000 mg twice a day. He says that he checks his sugars at home and they're usually in the 100s to 120s. His meter was unable to be interrogated today. He says he will make an appointment for diabetic eye exam in February when he hopes to be approved for Medicaid. He has chronic diabetic neuropathy for which he takes gabapentin. In regards to his hypertension, patient reports intermittent compliance with Accupril. He was able to identify which pill bottle was for hypertension and how often he should be taking medicine. He denies any headaches. His only other complaint today is of some decreased energy in the mornings, and says he feels "tired in his legs ". He says that this only lasts until he gets the shower, and then he fills reenergized. No worsening numbness or tingling of his extremities. Denies muscle spasms. No chest pain, shortness of breath, abdominal pain, constipation, diarrhea, dysuria, blood in stool or urine, lower extremity edema.    Past Medical History  Diagnosis Date  . GERD (gastroesophageal reflux disease)   . Diabetes mellitus   . Lower back pain     w/ R LE  sciatica s/p laminectomy in 2008. MRI- 04/2008  . Chronic headache   . Allergic rhinitis   . Hypertension   . Illiteracy and low-level literacy     Unclear of his literacy  in his native tongue, however, he is unable to read in [place]   Current Outpatient Prescriptions  Medication Sig Dispense Refill  . Blood Glucose Monitoring Suppl (WAVESENSE AMP) W/DEVICE KIT 1 meter, use to measure blood glucose 3 times daily  1 kit  0  . butalbital-acetaminophen-caffeine (FIORICET) 50-325-40 MG per tablet Take 1-2 tablets by mouth every 6 (six) hours as needed for headache.  20 tablet  1  . desloratadine (CLARINEX REDITAB) 5 MG disintegrating tablet Take 1 tablet (5 mg total) by mouth daily.  90 tablet  0  . gabapentin (NEURONTIN) 300 MG capsule Take 1 capsule (300 mg total) by mouth 3 (three) times daily.  90 capsule  2  . glucose blood test strip To check CBG 3 times a day  100 each  12  . insulin glargine (LANTUS SOLOSTAR) 100 UNIT/ML injection Inject 12 Units into the skin at bedtime.  15 mL  3  . Insulin Pen Needle (PRODIGY MINI PEN NEEDLES) 31G X 5 MM MISC Use as directed for once daily insulin injection.  30 each  2  . metFORMIN (GLUCOPHAGE) 1000 MG tablet Take 1 tablet (1,000 mg total) by mouth 2 (two) times daily with a meal.  60 tablet  11  . quinapril (ACCUPRIL) 20 MG tablet Take 1 tablet (20  mg total) by mouth daily.  30 tablet  3  . traMADol (ULTRAM) 50 MG tablet Take 1 tablet (50 mg total) by mouth every 6 (six) hours as needed for pain.  120 tablet  11   No family history on file. History   Social History  . Marital Status: Married    Spouse Name: N/A    Number of Children: N/A  . Years of Education: N/A   Social History Main Topics  . Smoking status: Never Smoker   . Smokeless tobacco: None  . Alcohol Use: No  . Drug Use: No  . Sexually Active: None   Other Topics Concern  . 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   Review of Systems: 10 pt ROS performed, pertinent positives and negatives noted in HPI   Objective:  Physical Exam: Filed Vitals:   03/30/12 1511  BP: 150/90  Pulse: 88  Temp: 98.4 F (36.9 C)  TempSrc: Oral  Height: 5' 7.5" (1.715 m)  Weight: 158 lb 8 oz (71.895 kg)  SpO2: 98%   Constitutional: Vital signs reviewed.  Patient is a thin male in no acute distress and cooperative with exam. Alert and oriented x3.  Head: Normocephalic and atraumatic Mouth: no erythema or exudates, MMM Eyes: PERRL, EOMI, No scleral icterus.  Neck: Supple, No JVD, mass, thyromegaly.  Cardiovascular: RRR, S1 normal, S2 normal, no MRG, pulses symmetric and intact bilaterally Pulmonary/Chest: CTAB, no wheezes, rales, or rhonchi Abdominal: Soft. Non-tender, non-distended, bowel sounds are normal, no masses, organomegaly, or guarding present.  GU: no CVA tenderness Musculoskeletal: 5/5 strength of all muscle groups of bilateral LE. No joint deformities, erythema, or stiffness, ROM full and no nontender Hematology: no cervical LAD  Neurological: 2+ patellar reflexes bilaterally. A&O x3, Strength is normal and symmetric bilaterally, cranial nerve II-XII are grossly intact, no focal motor deficit  Skin: Warm, dry and intact. No rash, cyanosis, or clubbing.  Psychiatric: Normal mood and affect. speech and behavior is normal. Judgment and thought content normal. Cognition and memory are normal.   Assessment & Plan:

## 2012-03-31 LAB — BASIC METABOLIC PANEL WITH GFR
BUN: 15 mg/dL (ref 6–23)
CO2: 23 mEq/L (ref 19–32)
Calcium: 9.8 mg/dL (ref 8.4–10.5)
Chloride: 106 mEq/L (ref 96–112)
Creat: 0.83 mg/dL (ref 0.50–1.35)
GFR, Est African American: >89 mL/min
Glucose, Bld: 69 mg/dL — ABNORMAL LOW (ref 70–99)

## 2012-04-01 ENCOUNTER — Telehealth: Payer: Self-pay | Admitting: Licensed Clinical Social Worker

## 2012-04-01 NOTE — Telephone Encounter (Signed)
Mitchell Page was referred to CSW regarding eligibility for [facility].  Unfortunately, Mitchell Page is still working on renewing his [employer].  Discussed concern regarding pt's low literacy and ability to manage disease.  Mitchell Page native language is [place]  Pt can read and write in his native language.  Pt is currently taking English classes at [employer].  Pt states his family usually reads all documentation for him.

## 2012-04-21 DIAGNOSIS — H2510 Age-related nuclear cataract, unspecified eye: Secondary | ICD-10-CM | POA: Diagnosis not present

## 2012-04-21 DIAGNOSIS — E11329 Type 2 diabetes mellitus with mild nonproliferative diabetic retinopathy without macular edema: Secondary | ICD-10-CM | POA: Diagnosis not present

## 2012-04-21 DIAGNOSIS — H3581 Retinal edema: Secondary | ICD-10-CM | POA: Diagnosis not present

## 2012-04-21 DIAGNOSIS — E11319 Type 2 diabetes mellitus with unspecified diabetic retinopathy without macular edema: Secondary | ICD-10-CM | POA: Diagnosis not present

## 2012-04-28 DIAGNOSIS — E11339 Type 2 diabetes mellitus with moderate nonproliferative diabetic retinopathy without macular edema: Secondary | ICD-10-CM | POA: Diagnosis not present

## 2012-04-28 DIAGNOSIS — E1139 Type 2 diabetes mellitus with other diabetic ophthalmic complication: Secondary | ICD-10-CM | POA: Diagnosis not present

## 2012-04-28 DIAGNOSIS — H43819 Vitreous degeneration, unspecified eye: Secondary | ICD-10-CM | POA: Diagnosis not present

## 2012-04-28 DIAGNOSIS — E11311 Type 2 diabetes mellitus with unspecified diabetic retinopathy with macular edema: Secondary | ICD-10-CM | POA: Diagnosis not present

## 2012-04-30 ENCOUNTER — Ambulatory Visit: Payer: No Typology Code available for payment source | Admitting: Internal Medicine

## 2012-05-03 ENCOUNTER — Encounter: Payer: Self-pay | Admitting: Internal Medicine

## 2012-05-03 ENCOUNTER — Ambulatory Visit (INDEPENDENT_AMBULATORY_CARE_PROVIDER_SITE_OTHER): Payer: Medicare Other | Admitting: Internal Medicine

## 2012-05-03 VITALS — BP 131/71 | HR 94 | Temp 97.6°F | Ht 72.0 in | Wt 162.6 lb

## 2012-05-03 DIAGNOSIS — K279 Peptic ulcer, site unspecified, unspecified as acute or chronic, without hemorrhage or perforation: Secondary | ICD-10-CM

## 2012-05-03 DIAGNOSIS — G8929 Other chronic pain: Secondary | ICD-10-CM

## 2012-05-03 DIAGNOSIS — E119 Type 2 diabetes mellitus without complications: Secondary | ICD-10-CM

## 2012-05-03 DIAGNOSIS — I10 Essential (primary) hypertension: Secondary | ICD-10-CM

## 2012-05-03 DIAGNOSIS — Z9109 Other allergy status, other than to drugs and biological substances: Secondary | ICD-10-CM | POA: Diagnosis not present

## 2012-05-03 LAB — POCT GLYCOSYLATED HEMOGLOBIN (HGB A1C): Hemoglobin A1C: 7.3

## 2012-05-03 LAB — GLUCOSE, CAPILLARY: Glucose-Capillary: 177 mg/dL — ABNORMAL HIGH (ref 70–99)

## 2012-05-03 MED ORDER — METFORMIN HCL 1000 MG PO TABS: 1000.0000 mg | ORAL_TABLET | Freq: Two times a day (BID) | ORAL | Status: DC

## 2012-05-03 MED ORDER — BUTALBITAL-APAP-CAFFEINE 50-325-40 MG PO TABS: 1.0000 | ORAL_TABLET | Freq: Four times a day (QID) | ORAL | Status: DC | PRN

## 2012-05-03 MED ORDER — INSULIN PEN NEEDLE 31G X 5 MM MISC: Status: DC

## 2012-05-03 MED ORDER — QUINAPRIL HCL 20 MG PO TABS: 20.0000 mg | ORAL_TABLET | Freq: Every day | ORAL | Status: DC

## 2012-05-03 NOTE — Patient Instructions (Signed)
General Instructions: Please schedule a follow up appointment in 3 months or sooner if needed . Please bring your medication bottles with your next appointment. Please take your medicines as prescribed. I will call you with your lab results if anything will be abnormal.    Treatment Goals:  Goals (1 Years of Data) as of 05/03/12         As of Today 03/30/12 02/23/12 01/29/12 05/27/11     Blood Pressure    . Blood Pressure < 140/90  131/71 150/90 160/90 133/86 148/91     Result Component    . HEMOGLOBIN A1C < 7.0  7.3   8.6     . LDL CALC < 100    66        Progress Toward Treatment Goals:  Treatment Goal 05/03/2012  Hemoglobin A1C at goal  Blood pressure at goal    Self Care Goals & Plans:  Self Care Goal 05/03/2012  Manage my medications take my medicines as prescribed; bring my medications to every visit; refill my medications on time; follow the sick day instructions if I am sick  Monitor my health keep track of my blood glucose; bring my glucose meter and log to each visit; check my feet daily  Eat healthy foods eat more vegetables; eat fruit for snacks and desserts; eat foods that are low in salt; eat baked foods instead of fried foods; eat smaller portions; drink diet soda or water instead of juice or soda  Be physically active take a walk every day; find an activity I enjoy    Home Blood Glucose Monitoring 05/03/2012  Check my blood sugar 3 times a day  When to check my blood sugar before meals     Care Management & Community Referrals:  Referral 05/03/2012  Referrals made for care management support none needed

## 2012-05-03 NOTE — Progress Notes (Signed)
Subjective:   Patient ID: Mitchell Page male   DOB: 1955/04/08 57 y.o.   MRN: [id number]  HPI: 57 year old gentleman with past medical history significant for type 2 diabetes mellitus, hypertension presents to the clinic for a followup visit.  HTN: Patient was seen by his PCP about a month ago when his blood pressure was found to be mildly elevated to systolic in the 150s. He was here for blood pressure recheck.  DM: For his diabetes, his CBG log was reviewed and his average blood sugar is 221. He denies any hypoglycemic events. He is complaint with his metformin.   He also reports that recently she has been noticing dry mouth at night. Denies any sore throat or or any other urinary complaints including polyuria.   He also reports that for the last 2 weeks that whenever he gets up around 1 or 2 AM in the middle of the night, he feels numbness on the sole of his right foot. He claims to be compliant with his Neurontin.  Patient has medicare and was requesting that his prescriptions should be sent to [facility] on [address] from here on   Past Medical History  Diagnosis Date  . GERD (gastroesophageal reflux disease)   . Diabetes mellitus   . Lower back pain     w/ R LE sciatica s/p laminectomy in 2008. MRI- 04/2008  . Chronic headache   . Allergic rhinitis   . Hypertension   . Illiteracy and low-level literacy     Unclear of his literacy  in his native tongue, however, he is unable to read in English   No family history on file. History   Social History  . Marital Status: Married    Spouse Name: N/A    Number of Children: N/A  . Years of Education: N/A   Occupational History  . Not on file.   Social History Main Topics  . Smoking status: Never Smoker   . Smokeless tobacco: Not on file  . Alcohol Use: No  . Drug Use: No  . Sexually Active: Not on file   Other Topics Concern  . Not on file   Social History Narrative   Immigrated from [place] in 1990.   Married, has 3  children.   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   Uninsured.   Highest education - middle school.   No smoking, alcohol or illicit drug use.      Cannot read in [place].   Review of Systems: General: Denies fever, chills, diaphoresis, appetite change and fatigue. HEENT: Denies photophobia, eye pain, redness, hearing loss, ear pain, congestion, sore throat, rhinorrhea, sneezing, mouth sores, trouble swallowing, neck pain, neck stiffness and tinnitus. Respiratory: Denies SOB, DOE, cough, chest tightness, and wheezing. Cardiovascular: Denies to chest pain, palpitations and leg swelling. Gastrointestinal: Denies nausea, vomiting, abdominal pain, diarrhea, constipation, blood in stool and abdominal distention. Genitourinary: Denies dysuria, urgency, frequency, hematuria, flank pain and difficulty urinating. Musculoskeletal: Denies myalgias, back pain, joint swelling, arthralgias and gait problem.  Skin: Denies pallor, rash and wound. Neurological: Denies dizziness, seizures, syncope, weakness, light-headedness, numbness and headaches. Hematological: Denies adenopathy, easy bruising, personal or family bleeding history. Psychiatric/Behavioral: Denies suicidal ideation, mood changes, confusion, nervousness, sleep disturbance and agitation.    Current Outpatient Medications: Current Outpatient Prescriptions  Medication Sig Dispense Refill  . Blood Glucose Monitoring Suppl (WAVESENSE AMP) W/DEVICE KIT 1 meter, use to measure blood  glucose 3 times daily  1 kit  0  . butalbital-acetaminophen-caffeine (FIORICET) 50-325-40 MG per tablet Take 1-2 tablets by mouth every 6 (six) hours as needed for headache.  20 tablet  1  . desloratadine (CLARINEX REDITAB) 5 MG disintegrating tablet Take 1 tablet (5 mg total) by mouth daily.  90 tablet  0  . gabapentin (NEURONTIN) 300 MG capsule Take 1 capsule (300 mg total) by mouth 3 (three)  times daily.  90 capsule  2  . glucose blood test strip To check CBG 3 times a day  100 each  12  . insulin glargine (LANTUS SOLOSTAR) 100 UNIT/ML injection Inject 12 Units into the skin at bedtime.  15 mL  3  . Insulin Pen Needle (PRODIGY MINI PEN NEEDLES) 31G X 5 MM MISC Use as directed for once daily insulin injection.  30 each  2  . metFORMIN (GLUCOPHAGE) 1000 MG tablet Take 1 tablet (1,000 mg total) by mouth 2 (two) times daily with a meal.  60 tablet  11  . quinapril (ACCUPRIL) 20 MG tablet Take 1 tablet (20 mg total) by mouth daily.  30 tablet  3  . traMADol (ULTRAM) 50 MG tablet Take 1 tablet (50 mg total) by mouth every 6 (six) hours as needed for pain.  120 tablet  11   No current facility-administered medications for this visit.    Allergies: No Known Allergies    Objective:   Physical Exam: Filed Vitals:   05/03/12 1515  BP: 131/71  Pulse: 94  Temp: 97.6 F (36.4 C)    General: Vital signs reviewed and noted. Well-developed, well-nourished, in no acute distress; alert, appropriate and cooperative throughout examination. Head: Normocephalic, atraumatic Lungs: Normal respiratory effort. Clear to auscultation BL without crackles or wheezes. Heart: RRR. S1 and S2 normal without gallop, murmur, or rubs. Abdomen:BS normoactive. Soft, Nondistended, non-tender.  No masses or organomegaly. Extremities: No pretibial edema. Neuro: alert, oriented, motor strength - 5/5 bilaterally in all four extremities, sensations intact to light touch in both feet and legs     Assessment & Plan:  Patient has Medicare and request all the prescriptions to be sent to [facility] at [address]. from here on

## 2012-05-03 NOTE — Assessment & Plan Note (Signed)
Blood pressure and goal today.  BP Readings from Last 3 Encounters:  05/03/12 131/71  03/30/12 150/90  02/23/12 160/90    Lab Results  Component Value Date   NA 140 03/30/2012   K 4.2 03/30/2012   CREATININE 0.83 03/30/2012    Assessment:  Blood pressure control: controlled  Progress toward BP goal:  at goal  Comments: Blood pressure was slightly elevated at last visit but continues to be at goal today  Plan:  Medications:  continue current medications  Educational resources provided:    Self management tools provided:    Other plans: None  today.

## 2012-05-03 NOTE — Assessment & Plan Note (Signed)
His A1c has improved to 7.3 as opposed to 8.6 with last clinic appointment about 3 months ago. Congratulated him over excellent control of his blood sugars! He was encouraged to be compliant with his medicines, diet and exercise. Patient reports that he has an ophthalmology appointment on 05/05/2012. He was encouraged not to miss it!

## 2012-05-05 DIAGNOSIS — E11311 Type 2 diabetes mellitus with unspecified diabetic retinopathy with macular edema: Secondary | ICD-10-CM | POA: Diagnosis not present

## 2012-05-13 ENCOUNTER — Other Ambulatory Visit: Payer: Self-pay | Admitting: Internal Medicine

## 2012-05-13 DIAGNOSIS — E119 Type 2 diabetes mellitus without complications: Secondary | ICD-10-CM

## 2012-05-13 DIAGNOSIS — K279 Peptic ulcer, site unspecified, unspecified as acute or chronic, without hemorrhage or perforation: Secondary | ICD-10-CM

## 2012-05-13 DIAGNOSIS — Z9109 Other allergy status, other than to drugs and biological substances: Secondary | ICD-10-CM

## 2012-05-13 DIAGNOSIS — G8929 Other chronic pain: Secondary | ICD-10-CM

## 2012-05-13 DIAGNOSIS — I10 Essential (primary) hypertension: Secondary | ICD-10-CM

## 2012-05-13 MED ORDER — TRAMADOL HCL 50 MG PO TABS: 50.0000 mg | ORAL_TABLET | Freq: Four times a day (QID) | ORAL | Status: DC | PRN

## 2012-05-25 DIAGNOSIS — H905 Unspecified sensorineural hearing loss: Secondary | ICD-10-CM | POA: Diagnosis not present

## 2012-05-25 DIAGNOSIS — H93299 Other abnormal auditory perceptions, unspecified ear: Secondary | ICD-10-CM | POA: Diagnosis not present

## 2012-05-25 DIAGNOSIS — H9110 Presbycusis, unspecified ear: Secondary | ICD-10-CM | POA: Diagnosis not present

## 2012-05-30 ENCOUNTER — Other Ambulatory Visit: Payer: Self-pay | Admitting: Internal Medicine

## 2012-06-03 ENCOUNTER — Encounter: Payer: Medicare Other | Admitting: Internal Medicine

## 2012-06-03 ENCOUNTER — Ambulatory Visit (INDEPENDENT_AMBULATORY_CARE_PROVIDER_SITE_OTHER): Payer: Medicare Other | Admitting: Internal Medicine

## 2012-06-03 ENCOUNTER — Encounter: Payer: Self-pay | Admitting: Internal Medicine

## 2012-06-03 VITALS — BP 136/87 | HR 88 | Temp 97.6°F | Ht 72.0 in | Wt 160.3 lb

## 2012-06-03 DIAGNOSIS — Z9109 Other allergy status, other than to drugs and biological substances: Secondary | ICD-10-CM | POA: Diagnosis not present

## 2012-06-03 DIAGNOSIS — J309 Allergic rhinitis, unspecified: Secondary | ICD-10-CM

## 2012-06-03 DIAGNOSIS — K219 Gastro-esophageal reflux disease without esophagitis: Secondary | ICD-10-CM | POA: Diagnosis not present

## 2012-06-03 DIAGNOSIS — E119 Type 2 diabetes mellitus without complications: Secondary | ICD-10-CM | POA: Diagnosis not present

## 2012-06-03 DIAGNOSIS — I10 Essential (primary) hypertension: Secondary | ICD-10-CM

## 2012-06-03 DIAGNOSIS — G8929 Other chronic pain: Secondary | ICD-10-CM

## 2012-06-03 LAB — GLUCOSE, CAPILLARY: Glucose-Capillary: 91 mg/dL (ref 70–99)

## 2012-06-03 MED ORDER — GABAPENTIN 300 MG PO CAPS: 300.0000 mg | ORAL_CAPSULE | Freq: Three times a day (TID) | ORAL | Status: DC

## 2012-06-03 MED ORDER — DESLORATADINE 5 MG PO TBDP: 5.0000 mg | ORAL_TABLET | Freq: Every day | ORAL | Status: DC

## 2012-06-03 MED ORDER — TRAMADOL HCL 50 MG PO TABS: 50.0000 mg | ORAL_TABLET | Freq: Four times a day (QID) | ORAL | Status: DC | PRN

## 2012-06-03 MED ORDER — INSULIN GLARGINE 100 UNIT/ML ~~LOC~~ SOLN: 14.0000 [IU] | Freq: Every day | SUBCUTANEOUS | Status: DC

## 2012-06-03 MED ORDER — OMEPRAZOLE 20 MG PO CPDR: 20.0000 mg | DELAYED_RELEASE_CAPSULE | Freq: Every day | ORAL | Status: DC

## 2012-06-03 MED ORDER — GLUCOSE BLOOD VI STRP: ORAL_STRIP | Status: DC

## 2012-06-03 NOTE — Progress Notes (Signed)
Patient ID: Hicks Feick, male   DOB: April 03, 1955, 57 y.o.   MRN: [id number]  Subjective:   Patient ID: Mylan Schwarz male   DOB: [birth year] 57 y.o.   MRN: [id number]  HPI: Mr.Tayvien Parodi is a 57 y.o. male with past medical history type 2 diabetes, chronic lower back pain, chronic headache, and hypertension who presents today for  In terms of his HTN, has been well-controlled on quinapril 20mg  daily. For his diabetes, he is taking Lantus 14U qhs and metformin 1000mg  BID. His A1c one month ago was 7.3 down from 8.6 three months prior. He brought in glucometer today and all sugars are between 90-130. Continues to exercise. Diabetic eye exam 05/05/12, will request records. Gabapentin is helping with his numbness/tingling. No foot sores.  Congratulated him on progress.  Today he has no complaints other than mild reflux sx and would like PPI refilled. All of his meds will now be filled at [facility]. [facility] remaining Rx refills.  Denies HA, chest pain, SOB, visual changes, N/V/D, abdominal pain, hematuria, hesitancy, blood in stool.   Past Medical History  Diagnosis Date  . GERD (gastroesophageal reflux disease)   . Diabetes mellitus   . Lower back pain     w/ R LE sciatica s/p laminectomy in 2008. MRI- 04/2008  . Chronic headache   . Allergic rhinitis   . Hypertension   . Illiteracy and low-level literacy     Unclear of his literacy  in his native tongue, however, he is unable to read in [place]   Current Outpatient Prescriptions  Medication Sig Dispense Refill  . Blood Glucose Monitoring Suppl (WAVESENSE AMP) W/DEVICE KIT 1 meter, use to measure blood glucose 3 times daily  1 kit  0  . butalbital-acetaminophen-caffeine (FIORICET) 50-325-40 MG per tablet Take 1-2 tablets by mouth every 6 (six) hours as needed for headache.  15 tablet  1  . desloratadine (CLARINEX REDITAB) 5 MG disintegrating tablet Take 1 tablet (5 mg total) by mouth daily.  90 tablet  0   . gabapentin (NEURONTIN) 300 MG capsule Take 1 capsule (300 mg total) by mouth 3 (three) times daily.  90 capsule  4  . glucose blood test strip To check CBG 3 times a day  100 each  12  . insulin glargine (LANTUS SOLOSTAR) 100 UNIT/ML injection Inject 0.14 mLs (14 Units total) into the skin at bedtime.  15 mL  3  . Insulin Pen Needle (PRODIGY MINI PEN NEEDLES) 31G X 5 MM MISC Use as directed for once daily insulin injection.  100 each  2  . loratadine (CLARITIN) 10 MG tablet TAKE ONE TABLET BY MOUTH ONE TIME DAILY  30 tablet  0  . metFORMIN (GLUCOPHAGE) 1000 MG tablet Take 1 tablet (1,000 mg total) by mouth 2 (two) times daily with a meal.  60 tablet  11  . omeprazole (PRILOSEC) 20 MG capsule Take 1 capsule (20 mg total) by mouth daily.  30 capsule  1  . quinapril (ACCUPRIL) 20 MG tablet Take 1 tablet (20 mg total) by mouth daily.  30 tablet  3  . traMADol (ULTRAM) 50 MG tablet Take 1 tablet (50 mg total) by mouth every 6 (six) hours as needed for pain.  120 tablet  11   No current facility-administered medications for this visit.   No family history on file. History   Social History  . Marital Status: Married    Spouse Name: N/A  Number of Children: N/A  . Years of Education: N/A   Social History Main Topics  . Smoking status: Never Smoker   . Smokeless tobacco: None  . Alcohol Use: No  . Drug Use: No  . Sexually Active: None   Other Topics Concern  . None   Social History Narrative   Immigrated from [place] in 1990.   Married, has 3 children.   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   Uninsured.   Highest education - middle school.   No smoking, alcohol or illicit drug use.      Cannot read in [place].   Review of Systems: 10 pt ROS performed, pertinent positives and negatives noted in HPI Objective:  Physical Exam: Filed Vitals:   06/03/12 1453  BP: 136/87  Pulse: 88  Temp: 97.6 F (36.4 C)   TempSrc: Oral  Height: 6' (1.829 m)  Weight: 160 lb 4.8 oz (72.712 kg)  SpO2: 96%  Constitutional: Vital signs reviewed. Patient is a thin male in no acute distress and cooperative with exam. Alert and oriented x3.  Head: Normocephalic and atraumatic  Mouth: no erythema or exudates, MMM  Eyes: PERRL, EOMI, No scleral icterus.  Neck: Supple, no mass or LAD Cardiovascular: RRR, S1 normal, S2 normal, no MRG, pulses symmetric and intact bilaterally  Pulmonary/Chest: CTAB, no wheezes, rales, or rhonchi  Abdominal: Soft. Non-tender, non-distended, bowel sounds are normal, no masses, organomegaly, or guarding present.  Musculoskeletal: 5/5 strength of all muscle groups of bilateral LE. Neurological: A&O x3, Strength is normal and symmetric bilaterally, cranial nerve II-XII are grossly intact, no focal motor deficit  Skin: Warm, dry and intact. No rash, cyanosis, or clubbing.  Psychiatric: Normal mood and affect. speech and behavior is normal. Judgment and thought content normal. Cognition and memory are normal.    Assessment & Plan:   Please see problem-based charting for assessment and plan.

## 2012-06-03 NOTE — Patient Instructions (Signed)
You are doing a great job! Keep taking your medicines. We will see you again in 3 months. Please bring your glucose meter.

## 2012-06-03 NOTE — Assessment & Plan Note (Signed)
Occasional heartburn, no warning symptoms. Would like refill of omeprazole. - Omeprazole 20mg  qd

## 2012-06-03 NOTE — Assessment & Plan Note (Signed)
Lab Results  Component Value Date   HGBA1C 7.3 05/03/2012   HGBA1C 8.6 01/29/2012   HGBA1C 7.8 04/16/2011     Assessment:  Diabetes control: good control (HgbA1C at goal)  Progress toward A1C goal:  at goal Eye appt 05/05/12, will request records.   Plan:  Medications:  continue current medications Lantus 14U qhs, metformin 1000 BID  Home glucose monitoring:   Frequency: once a day   Timing:    Instruction/counseling given: reminded to bring blood glucose meter & log to each visit   Self management tools provided: copy of home glucose meter download

## 2012-06-03 NOTE — Assessment & Plan Note (Signed)
BP Readings from Last 3 Encounters:  06/03/12 136/87  05/03/12 131/71  03/30/12 150/90    Lab Results  Component Value Date   NA 140 03/30/2012   K 4.2 03/30/2012   CREATININE 0.83 03/30/2012    Assessment:  Blood pressure control: controlled  Progress toward BP goal:  at goal   Plan:  Medications:  continue current medications

## 2012-06-10 DIAGNOSIS — E119 Type 2 diabetes mellitus without complications: Secondary | ICD-10-CM | POA: Diagnosis not present

## 2012-06-10 DIAGNOSIS — I10 Essential (primary) hypertension: Secondary | ICD-10-CM | POA: Diagnosis not present

## 2012-06-10 DIAGNOSIS — J209 Acute bronchitis, unspecified: Secondary | ICD-10-CM | POA: Diagnosis not present

## 2012-06-10 DIAGNOSIS — E7800 Pure hypercholesterolemia, unspecified: Secondary | ICD-10-CM | POA: Diagnosis not present

## 2012-06-10 DIAGNOSIS — R5381 Other malaise: Secondary | ICD-10-CM | POA: Diagnosis not present

## 2012-06-10 DIAGNOSIS — R3916 Straining to void: Secondary | ICD-10-CM | POA: Diagnosis not present

## 2012-06-10 DIAGNOSIS — G609 Hereditary and idiopathic neuropathy, unspecified: Secondary | ICD-10-CM | POA: Diagnosis not present

## 2012-06-10 DIAGNOSIS — K219 Gastro-esophageal reflux disease without esophagitis: Secondary | ICD-10-CM | POA: Diagnosis not present

## 2012-06-10 DIAGNOSIS — N419 Inflammatory disease of prostate, unspecified: Secondary | ICD-10-CM | POA: Diagnosis not present

## 2012-06-11 DIAGNOSIS — IMO0002 Reserved for concepts with insufficient information to code with codable children: Secondary | ICD-10-CM | POA: Diagnosis not present

## 2012-06-17 DIAGNOSIS — IMO0002 Reserved for concepts with insufficient information to code with codable children: Secondary | ICD-10-CM | POA: Diagnosis not present

## 2012-06-22 ENCOUNTER — Telehealth: Payer: Self-pay | Admitting: *Deleted

## 2012-06-22 NOTE — Telephone Encounter (Signed)
Pt came by office stating he was having surgery on his arm on Wednesday of this week.  He wanted to know if we needed to do a medical clearance.   I call Dr Glenna Durand office and asked if they wanted a M/C for pt and they said no, it was not necessary. Pt informed.

## 2012-06-23 DIAGNOSIS — IMO0002 Reserved for concepts with insufficient information to code with codable children: Secondary | ICD-10-CM | POA: Diagnosis not present

## 2012-06-23 DIAGNOSIS — Y831 Surgical operation with implant of artificial internal device as the cause of abnormal reaction of the patient, or of later complication, without mention of misadventure at the time of the procedure: Secondary | ICD-10-CM | POA: Diagnosis not present

## 2012-06-23 DIAGNOSIS — T8489XA Other specified complication of internal orthopedic prosthetic devices, implants and grafts, initial encounter: Secondary | ICD-10-CM | POA: Diagnosis not present

## 2012-07-06 DIAGNOSIS — IMO0002 Reserved for concepts with insufficient information to code with codable children: Secondary | ICD-10-CM | POA: Diagnosis not present

## 2012-07-12 ENCOUNTER — Other Ambulatory Visit: Payer: Self-pay | Admitting: *Deleted

## 2012-07-13 MED ORDER — LORATADINE 10 MG PO TABS: ORAL_TABLET | ORAL | Status: DC

## 2012-07-22 DIAGNOSIS — Z4789 Encounter for other orthopedic aftercare: Secondary | ICD-10-CM | POA: Diagnosis not present

## 2012-08-10 DIAGNOSIS — Z4889 Encounter for other specified surgical aftercare: Secondary | ICD-10-CM | POA: Diagnosis not present

## 2012-08-18 ENCOUNTER — Ambulatory Visit (HOSPITAL_BASED_OUTPATIENT_CLINIC_OR_DEPARTMENT_OTHER): Payer: Medicare Other | Admitting: Internal Medicine

## 2012-08-18 ENCOUNTER — Encounter: Payer: Self-pay | Admitting: Internal Medicine

## 2012-08-18 VITALS — BP 148/94 | HR 71 | Temp 96.8°F | Ht 68.5 in | Wt 162.0 lb

## 2012-08-18 DIAGNOSIS — E119 Type 2 diabetes mellitus without complications: Secondary | ICD-10-CM | POA: Diagnosis not present

## 2012-08-18 DIAGNOSIS — Z7189 Other specified counseling: Secondary | ICD-10-CM | POA: Diagnosis not present

## 2012-08-18 DIAGNOSIS — G8929 Other chronic pain: Secondary | ICD-10-CM

## 2012-08-18 DIAGNOSIS — Z7184 Encounter for health counseling related to travel: Secondary | ICD-10-CM | POA: Insufficient documentation

## 2012-08-18 DIAGNOSIS — Z9109 Other allergy status, other than to drugs and biological substances: Secondary | ICD-10-CM

## 2012-08-18 DIAGNOSIS — K219 Gastro-esophageal reflux disease without esophagitis: Secondary | ICD-10-CM | POA: Diagnosis not present

## 2012-08-18 DIAGNOSIS — I10 Essential (primary) hypertension: Secondary | ICD-10-CM | POA: Diagnosis not present

## 2012-08-18 LAB — GLUCOSE, CAPILLARY: Glucose-Capillary: 128 mg/dL — ABNORMAL HIGH (ref 70–99)

## 2012-08-18 MED ORDER — INSULIN GLARGINE 100 UNIT/ML ~~LOC~~ SOLN: 16.0000 [IU] | Freq: Every day | SUBCUTANEOUS | Status: DC

## 2012-08-18 MED ORDER — OMEPRAZOLE 20 MG PO CPDR: 20.0000 mg | DELAYED_RELEASE_CAPSULE | Freq: Every day | ORAL | Status: DC

## 2012-08-18 MED ORDER — QUINAPRIL-HYDROCHLOROTHIAZIDE 10-12.5 MG PO TABS: 1.0000 | ORAL_TABLET | Freq: Every day | ORAL | Status: DC

## 2012-08-18 MED ORDER — GLUCOSE BLOOD VI STRP: ORAL_STRIP | Status: DC

## 2012-08-18 MED ORDER — BUTALBITAL-APAP-CAFFEINE 50-325-40 MG PO TABS: 1.0000 | ORAL_TABLET | Freq: Four times a day (QID) | ORAL | Status: DC | PRN

## 2012-08-18 NOTE — Progress Notes (Signed)
Patient ID: Mitchell Page, male   DOB: [birth year], 57 y.o.   MRN: [id number]  Subjective:   Patient ID: Mitchell Page male   DOB: 1955-10-07 57 y.o.   MRN: [id number]  HPI: Mitchell Page is a 57 y.o. male with past medical history type 2 diabetes, chronic lower back pain, chronic headache, and hypertension who presents today for DM and HTN In terms of his HTN, his BP has been well-controlled on quinapril 20mg  daily.  For his diabetes, he is taking Lantus 14U qhs and metformin 1000mg  BID. His A1c in February 2014 was 7.3 down from 8.6 three months prior. Has run out of strips for his meter and has not checked blood sugar much since last visit.Marland Kitchen He continues to exercise. 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 On gabapentin for numbness/tingling. No foot sores. Since his last visit he had revisional surgery of L arm from prior fracture performed by Dr. Melvyn Novas. Tolerate the procedure well with no complications. Will request records. Is traveling to [place] in 6 weeks. Wants immunizations/prophylactic meds. Says he does not need some of his vaccines because he has already had them, but did not bring in his vaccine form today.    Past Medical History  Diagnosis Date  . GERD (gastroesophageal reflux disease)   . Diabetes mellitus   . Lower back pain     w/ R LE sciatica s/p laminectomy in 2008. MRI- 04/2008  . Chronic headache   . Allergic rhinitis   . Hypertension   . Illiteracy and low-level literacy     Unclear of his literacy  in his native tongue, however, he is unable to read in [place]   Current Outpatient Prescriptions  Medication Sig Dispense Refill  . Blood Glucose Monitoring Suppl (WAVESENSE AMP) W/DEVICE KIT 1 meter, use to measure blood glucose 3 times daily  1 kit  0  . butalbital-acetaminophen-caffeine (FIORICET) 50-325-40 MG per tablet Take 1-2 tablets by mouth every 6 (six) hours as needed for headache.  15 tablet  1   . desloratadine (CLARINEX REDITAB) 5 MG disintegrating tablet Take 1 tablet (5 mg total) by mouth daily.  90 tablet  0  . gabapentin (NEURONTIN) 300 MG capsule Take 1 capsule (300 mg total) by mouth 3 (three) times daily.  90 capsule  4  . glucose blood test strip To check CBG 3 times a day  100 each  12  . insulin glargine (LANTUS SOLOSTAR) 100 UNIT/ML injection Inject 0.14 mLs (14 Units total) into the skin at bedtime.  15 mL  3  . Insulin Pen Needle (PRODIGY MINI PEN NEEDLES) 31G X 5 MM MISC Use as directed for once daily insulin injection.  100 each  2  . loratadine (CLARITIN) 10 MG tablet TAKE ONE TABLET BY MOUTH ONE TIME DAILY  30 tablet  2  . metFORMIN (GLUCOPHAGE) 1000 MG tablet Take 1 tablet (1,000 mg total) by mouth 2 (two) times daily with a meal.  60 tablet  11  . methocarbamol (ROBAXIN) 500 MG tablet       . omeprazole (PRILOSEC) 20 MG capsule Take 1 capsule (20 mg total) by mouth daily.  30 capsule  1  . oxyCODONE-acetaminophen (PERCOCET) 10-325 MG per tablet       . quinapril (ACCUPRIL) 20 MG tablet Take 1 tablet (20 mg total) by mouth daily.  30 tablet  3  . traMADol (ULTRAM) 50 MG tablet Take 1 tablet (50 mg total)  by mouth every 6 (six) hours as needed for pain.  120 tablet  11   No current facility-administered medications for this visit.   No family history on file. History   Social History  . Marital Status: Married    Spouse Name: N/A    Number of Children: N/A  . Years of Education: N/A   Social History Main Topics  . Smoking status: Never Smoker   . Smokeless tobacco: None  . Alcohol Use: No  . Drug Use: No  . Sexually Active: None   Other Topics Concern  . None   Social History Narrative   Immigrated from [place] in 1990.   Married, has 3 children.   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   Uninsured.   Highest education - middle school.   No smoking, alcohol or illicit drug use.       Cannot read in [place].   Review of Systems: 10 pt ROS performed, pertinent positives and negatives noted in HPI Objective:  Physical Exam: Filed Vitals:   08/18/12 0819  BP: 148/94  Pulse: 71  Temp: 96.8 F (36 C)  TempSrc: Oral  Height: 5' 8.5" (1.74 m)  Weight: 162 lb (73.483 kg)  SpO2: 99%   Constitutional: Vital signs reviewed. Patient is a thin man in no acute distress and cooperative with exam. Alert and oriented x3.  Head: Normocephalic and atraumatic  Mouth: no erythema or exudates, MMM  Eyes: PERRL, EOMI, No scleral icterus.  Neck: Supple, no mass or LAD  Cardiovascular: RRR, S1 normal, S2 normal, no MRG, pulses symmetric and intact bilaterally  Pulmonary/Chest: CTAB, no wheezes, rales, or rhonchi  Abdominal: Soft. Non-tender, non-distended, bowel sounds are normal, no masses, organomegaly, or guarding present.  Musculoskeletal: L arm in splint. 5/5 strength R arm and bilateral LE Neurological: A&O x3, Strength is normal and symmetric bilaterally, cranial nerve II-XII are grossly intact, no focal motor deficit  Skin: Warm, dry and intact. No rash, cyanosis, or clubbing.  Psychiatric: Normal mood and affect. speech and behavior is normal. Judgment and thought content normal. Cognition and memory are normal.   Assessment & Plan:   Please see problem-based charting for assessment and plan.

## 2012-08-18 NOTE — Progress Notes (Signed)
INTERNAL MEDICINE TEACHING ATTENDING ADDENDUM: I discussed this case with Dr. Ziemer soon after the patient visit. I agree with her HPI, exam findings and diagnoses. I have read her documentation and I agree with her plan of care. Please see the resident note above for details of management.    

## 2012-08-18 NOTE — Assessment & Plan Note (Signed)
Traveling to [place]. Per CDC rec, will need UTD vaccine for Hep A/B, typhoid. 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  Mefloquine is on his pharmacy's preferred list. Patient says he is UTD on vaccines but did not bring in his immunization form today (says it is at home). Told him we do not have typhoid vaccine, but he is confident he does not need it. Does not have $50 to go to [facility].  - Instructed him to bring in immunization form and I will Rx appropriate prophylaxis and immunizations as indicated by travel recs and his vaccine history - He agrees to come back in 2 weeks w necessary paperwork

## 2012-08-18 NOTE — Patient Instructions (Signed)
1. Increase insulin to 16 units a day. 2. Stop taking quinapril and take new blood pressure medicine - quinapril-HCTZ 3. Come back in 2 weeks and bring your yellow vaccine form.

## 2012-08-18 NOTE — Assessment & Plan Note (Signed)
BP Readings from Last 3 Encounters:  08/18/12 148/94  06/03/12 136/87  05/03/12 131/71    Lab Results  Component Value Date   NA 140 03/30/2012   K 4.2 03/30/2012   CREATININE 0.83 03/30/2012    Assessment: Blood pressure control: mildly elevated Progress toward BP goal:  deteriorated   Plan: Medications:  Change from quinapril to quinapril-HCTZ 10-12.5 (formulary preferred). Titrate up prn Educational resources provided: brochure

## 2012-08-18 NOTE — Assessment & Plan Note (Signed)
Refilled omeprazole today for intermittent heart burn

## 2012-08-18 NOTE — Assessment & Plan Note (Signed)
Lab Results  Component Value Date   HGBA1C 7.9 08/18/2012   HGBA1C 7.3 05/03/2012   HGBA1C 8.6 01/29/2012     Assessment: Diabetes control: fair control Progress toward A1C goal:  deteriorated   Plan: Medications:  INcrease lantus to 16U qhs, will see again in 2 weeks, titrate up prn. Already on 1000 BID metformin Home glucose monitoring: Frequency: 3 times a day Timing: before breakfast;after lunch;at bedtime Instruction/counseling given: reminded to bring blood glucose meter & log to each visit Educational resources provided: brochure Self management tools provided: copy of home glucose meter download  Refilled strips today

## 2012-08-23 ENCOUNTER — Other Ambulatory Visit: Payer: Self-pay | Admitting: *Deleted

## 2012-08-23 MED ORDER — ACCU-CHEK AVIVA PLUS W/DEVICE KIT: 1.0000 | PACK | Freq: Three times a day (TID) | Status: DC

## 2012-08-23 NOTE — Telephone Encounter (Signed)
Medicaid/ medicare uses accu-chek. Also the scripts must state : diag code, insulin dependent and how many times daily to check please approve and send electronically

## 2012-08-24 ENCOUNTER — Other Ambulatory Visit: Payer: Self-pay | Admitting: *Deleted

## 2012-08-24 MED ORDER — GLUCOSE BLOOD VI STRP: ORAL_STRIP | Status: DC

## 2012-08-24 MED ORDER — ACCU-CHEK SOFTCLIX LANCET DEV MISC: Status: DC

## 2012-08-24 NOTE — Addendum Note (Signed)
Addended by: Merrie Roof A on: 08/24/2012 02:46 PM   Modules accepted: Orders

## 2012-08-24 NOTE — Addendum Note (Signed)
Addended by: Ignacia Palma on: 08/24/2012 04:39 PM   Modules accepted: Orders

## 2012-08-24 NOTE — Addendum Note (Signed)
Addended by: Ignacia Palma on: 08/24/2012 02:37 PM   Modules accepted: Orders

## 2012-08-24 NOTE — Addendum Note (Signed)
Addended by: Ignacia Palma on: 08/24/2012 02:53 PM   Modules accepted: Orders

## 2012-08-27 ENCOUNTER — Encounter: Payer: Medicare Other | Admitting: Internal Medicine

## 2012-09-01 ENCOUNTER — Encounter: Payer: Self-pay | Admitting: Internal Medicine

## 2012-09-01 ENCOUNTER — Ambulatory Visit (INDEPENDENT_AMBULATORY_CARE_PROVIDER_SITE_OTHER): Payer: Medicare Other | Admitting: Internal Medicine

## 2012-09-01 VITALS — BP 140/88 | HR 75 | Temp 97.0°F | Ht 68.0 in | Wt 160.1 lb

## 2012-09-01 DIAGNOSIS — Z7189 Other specified counseling: Secondary | ICD-10-CM | POA: Diagnosis not present

## 2012-09-01 DIAGNOSIS — Z7184 Encounter for health counseling related to travel: Secondary | ICD-10-CM

## 2012-09-01 DIAGNOSIS — E119 Type 2 diabetes mellitus without complications: Secondary | ICD-10-CM | POA: Diagnosis not present

## 2012-09-01 LAB — GLUCOSE, CAPILLARY: Glucose-Capillary: 132 mg/dL — ABNORMAL HIGH (ref 70–99)

## 2012-09-01 MED ORDER — MEFLOQUINE HCL 250 MG PO TABS: 250.0000 mg | ORAL_TABLET | ORAL | Status: DC

## 2012-09-01 NOTE — Assessment & Plan Note (Signed)
Lab Results  Component Value Date   HGBA1C 7.9 08/18/2012   HGBA1C 7.3 05/03/2012   HGBA1C 8.6 01/29/2012     Assessment: Diabetes control: fair control Progress toward A1C goal:  improved Comments: Lantus 16U qhs and metformin 1000mg  BID Plan: Medications:  continue current medications Home glucose monitoring: Frequency:   Timing:   Instruction/counseling given: reminded to bring blood glucose meter & log to each visit and reminded to bring medications to each visit Educational resources provided: brochure Self management tools provided: copy of home glucose meter download

## 2012-09-01 NOTE — Progress Notes (Signed)
Patient ID: Mitchell Page, male   DOB: Apr 29, 1955, 57 y.o.   MRN: [id number]  Subjective:   Patient ID: Mitchell Page male   DOB: [birth year] 57 y.o.   MRN: [id number]  HPI: Mitchell Page is a 57 y.o. male with history of HTN, T2DM, GERD presenting to the clinic for followup of DM and to discuss travel vaccinations.  For his diabetes, he is taking Lantus 16U qhs and metformin 1000mg  BID. His A1c earlier this month bumped up from 7.3-->7.9. lantus increased from 14U-->16U qhs.  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 On gabapentin for numbness/tingling. No foot sores. Did bring his meter today. One outlier of 330, all others in range 100-150. No hypoglycemic symptoms.  Is traveling to [place] in 4 weeks. Wants immunizations/prophylactic meds. From prior travel says he is UTD on immunizations including yellow fever, meningococcal, typhoid and hepatitis. Does not have complete immunization record with him, but assures that he has had these vaccines in [place].    Past Medical History  Diagnosis Date  . GERD (gastroesophageal reflux disease)   . Diabetes mellitus   . Lower back pain     w/ R LE sciatica s/p laminectomy in 2008. MRI- 04/2008  . Chronic headache   . Allergic rhinitis   . Hypertension   . Illiteracy and low-level literacy     Unclear of his literacy  in his native tongue, however, he is unable to read in [place]   Current Outpatient Prescriptions  Medication Sig Dispense Refill  . Blood Glucose Monitoring Suppl (ACCU-CHEK AVIVA PLUS) W/DEVICE KIT 1 Device by Does not apply route 3 (three) times daily. To check blood sugar 3 times daily. diag code 250.00. Insulin dependent  1 kit  0  . Blood Glucose Monitoring Suppl (WAVESENSE AMP) W/DEVICE KIT 1 meter, use to measure blood glucose 3 times daily  1 kit  0  . butalbital-acetaminophen-caffeine (FIORICET) 50-325-40 MG per tablet Take 1-2  tablets by mouth every 6 (six) hours as needed for headache.  15 tablet  1  . desloratadine (CLARINEX REDITAB) 5 MG disintegrating tablet Take 1 tablet (5 mg total) by mouth daily.  90 tablet  0  . gabapentin (NEURONTIN) 300 MG capsule Take 1 capsule (300 mg total) by mouth 3 (three) times daily.  90 capsule  4  . glucose blood (ACCU-CHEK AVIVA) test strip Use to check blood sugar twice daily, before breakfast and after lunch or dinner.  100 each  12  . glucose blood test strip To check CBG 3 times a day  100 each  5  . insulin glargine (LANTUS) 100 UNIT/ML injection Inject 0.16 mLs (16 Units total) into the skin at bedtime.  15 mL  3  . Insulin Pen Needle (PRODIGY MINI PEN NEEDLES) 31G X 5 MM MISC Use as directed for once daily insulin injection.  100 each  2  . Lancet Devices (ACCU-CHEK SOFTCLIX) lancets Use to check blood sugar twice a day  1 each  5  . loratadine (CLARITIN) 10 MG tablet TAKE ONE TABLET BY MOUTH ONE TIME DAILY  30 tablet  2  . metFORMIN (GLUCOPHAGE) 1000 MG tablet Take 1 tablet (1,000 mg total) by mouth 2 (two) times daily with a meal.  60 tablet  11  . methocarbamol (ROBAXIN) 500 MG tablet       . omeprazole (PRILOSEC) 20 MG capsule Take 1 capsule (20 mg total)  by mouth daily.  30 capsule  5  . oxyCODONE-acetaminophen (PERCOCET) 10-325 MG per tablet       . quinapril-hydrochlorothiazide (ACCURETIC) 10-12.5 MG per tablet Take 1 tablet by mouth daily.  30 tablet  1  . traMADol (ULTRAM) 50 MG tablet Take 1 tablet (50 mg total) by mouth every 6 (six) hours as needed for pain.  120 tablet  11   No current facility-administered medications for this visit.   No family history on file. History   Social History  . Marital Status: Married    Spouse Name: N/A    Number of Children: N/A  . Years of Education: N/A   Social History Main Topics  . Smoking status: Never Smoker   . Smokeless tobacco: None  . Alcohol Use: No  . Drug Use: No  . Sexually Active: None   Other Topics  Concern  . None   Social History Narrative   Immigrated from [place] in 1990.   Married, has 3 children.   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   Uninsured.   Highest education - middle school.   No smoking, alcohol or illicit drug use.      Cannot read in [place].   Review of Systems: 10 pt ROS performed, pertinent positives and negatives noted in HPI Objective:  Physical Exam: Filed Vitals:   09/01/12 0923  BP: 140/88  Pulse: 75  Temp: 97 F (36.1 C)  TempSrc: Oral  Height: 5\' 8"  (1.727 m)  Weight: 160 lb 1.6 oz (72.621 kg)  SpO2: 98%   Constitutional: Vital signs reviewed. Patient is a thin man in no acute distress and cooperative with exam. Alert and oriented x3.  Head: Normocephalic and atraumatic  Mouth: no erythema or exudates, MMM  Eyes: PERRL, EOMI, No scleral icterus.  Cardiovascular: RRR, S1 normal, S2 normal, no MRG, pulses symmetric and intact bilaterally  Pulmonary/Chest: CTAB, no wheezes, rales, or rhonchi  Abdominal: Soft. Non-tender, non-distended, bowel sounds are normal, no masses, organomegaly, or guarding present.  Musculoskeletal: Stable postop changes of L arm, 5/5 strength Neurological: A&O x3, Strength is normal and symmetric bilaterally, cranial nerve II-XII are grossly intact, no focal motor deficit  Skin: Warm, dry and intact. No rash, cyanosis, or clubbing.  l.   Assessment & Plan:   Please see problem-based charting for assessment and plan.

## 2012-09-01 NOTE — Assessment & Plan Note (Addendum)
Traveling to [place]. Per CDC rec, will need UTD vaccine for Hep A/B, typhoid. 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 Patient reports UTD vaccinations of HepA/B and typhoid last year (says [place] [facility] office has documentation), and brings in proof of immunization of meningococcal, yellow fever.  Mefloquine is on his pharmacy's preferred list. - Mefloquine weekly starting 1 week before travel and then 4 weeks after return. Pt educated. - Safe eating practices discussed

## 2012-09-01 NOTE — Patient Instructions (Signed)
1. Start taking malaria medicine ONCE A WEEK, one week before you leave and then once per week through your travels and for four weeks after you return.  2. Avoid raw foods, only drink purified or boiled water.   Traveling, Food and Drink Risks Unclean or not pure (contaminated) food and drink are common sources for bringing infection into the body, especially the digestive system (gastroenteritis). This can cause nausea, stomach cramping, diarrhea (with or without visible blood) and/or vomiting. Some of the common infections that travelers can get are:  Escherichia coli infections (E. coli).  Shigellosis or bacillary dysentery.  Giardiasis.  Campylobacter jejuni infections.  Cryptosporidiosis.  Hepatitis A.  Amebiasis. Other less common infectious disease risks for travelers include:  Typhoid fever and other salmonelloses.  Cholera.  Viral infections caused by rotavirus and noroviruses.  Various protozoan and helminthic (worm-like) parasites. Many of the infectious diseases transmitted in food and water can also be acquired when solid body wastes, or feces, contaminate food (fecal-oral route). WHAT IS TRAVELERS' DIARRHEA? The most common travel health problem is travelers' diarrhea. Between 20% and 50% of international travelers develop diarrhea each year. It often occurs in the first week of travel. However, it may occur at any time while traveling, or after returning home. The world is divided into three grades of risk:  Low-risk: [place], [place], [place], [place], [place], countries in [place] and [place].  Intermediate-risk: [place], [place], some of the [place] islands.  High-risk: Most of [place], [place], [place], [place], [place] and [place]. In high risk places, often many people do not have access to plumbing or outhouses. The amount of contaminated stool is high and more open to flies. Poor electricity capacity can cause blackouts.  This affects refrigeration, which may cause unsafe food storage. Lack of water supplies may mean a lack of sinks for hand washing by restaurant staff. People at greater risk include young adults, and those with low immune response. This includes people with HIV/AIDS, or those taking medicines to decrease immunity. Also, people with inflammatory-bowel disease or diabetes, and people taking stomach medicines that reduce acid level (H-2 blockers or antacids). The leading cause of infection is consuming food or water contaminated with feces. Poor hygiene practice in local restaurants is thought to be the largest cause of travelers' diarrhea. The bacteria or germ that most often causes this illness is E. coli. WHAT ARE THE SYMPTOMS OF TRAVELERS' DIARRHEA? The illness often begins suddenly. It causes an increase in frequency, volume, and weight of stool. An affected person often has 4 to 5 loose, watery bowel movements each day. Other symptoms include nausea, vomiting, diarrhea, stomach cramping, bloating, fever, urgency, and general ill feeling (malaise). Most cases are not serious and go away in 1-2 days without treatment. Travelers' diarrhea is rarely life-threatening. (90% of cases resolve in 1 week. 98% resolve in 1 month.) PREVENTING TRAVELERS' DIARRHEA Reduce your exposure to potentially not pure water. Water that is chlorinated, using minimum water treatment standards used in the [place] protects against viral and bacterial (germs) diseases. Chlorine treatment alone might not kill some enteric (gut) viruses. It may not kill all infection causing parasites. Where chlorinated tap water is not available, or where hygiene and sanitation are poor, only the following might be safe to drink:   Beverages made with boiled water (tea, coffee).  Canned or bottled carbonated drinks (carbonated bottled water, soft drinks).  Beer.  Wine. When buying carbonated drinks or bottled water, always inspect the  bottle  seal. Make sure it has not been opened. This could mean it was refilled with unclean beverages. If you suspect a bottle seal has been tampered with, return or discard it.  Where water might be contaminated, ice could be, also. Ice should not be used in beverages. If ice comes in contact with containers used for drinking, discard the ice. Thoroughly clean the containers, preferably with soap and hot water.  It is safer to drink a beverage directly from the can or bottle than from a questionable container. However, water on the outside of cans or bottles might not be pure. Dry wet cans or bottles before they are opened. Wipe clean the surfaces where your mouth will have contact. Also, avoid brushing your teeth with tap water.  PREVENTIVE TREATMENT OF WATER  The following methods can be used to treat water. This makes it safe for drinking and other purposes.   Boiling. This is the best method. Bring water to a rolling boil for 1 minute. Then allow it to cool to room temperature. Ice should not be added. Boiling will kill bacterial and parasitic causes of diarrhea at all altitudes. It will kill viruses at low altitudes. To kill viruses at altitudes above 2,000 meters (6,562 feet), water should be boiled for 3 minutes. Or chemical disinfection should be used after the water has boiled for 1 minute. To improve taste, add a pinch of salt to each quart. Or pour the water several times from one clean container to another.  Chemical disinfection. Iodine can be used, when boiling is not possible. However, this method might not kill all parasites, unless the water sits for 15 hours before it is drunk. Two well-tested methods for disinfection with iodine are the use of:  Tincture of iodine.  Tetraglycine hydroperiodide tablets. Examples are Globaline, Potable-Aqua, or Coghlan's. You can find these tablets at pharmacies and sporting goods stores. Follow the instructions on the label. If water is cloudy, double the  number of tablets used. If water is extremely cold (less than 5 Celsius or 41 Fahrenheit), try to warm it. Increase the advised contact time to achieve reliable disinfection. Cloudy water should be strained through a clean cloth into a container. This should remove floating matter. Then the water should be boiled. Or it can be treated with iodine. Chlorine, in various forms, can also be used for chemical disinfection. But its germ killing activity varies greatly with the acidity (pH), temperature, and content of the water. Chemically treated water is meant for short-term use only. Use iodine disinfected water for only a few weeks.   Portable filters provide various degrees of protection. Reverse-osmosis filters protect against viruses, bacteria (germs), and protozoa. However, they are expensive and large. The small pores on this type of filter get quickly plugged by muddy or cloudy water. The membranes in some filters can be damaged by chlorine. Microstrainer filters (0.1- to 0.3-micrometers), can remove bacteria and protozoa. But they do not remove viruses. To kill viruses, travelers should disinfect the water with iodine or chlorine after filtration. Filters with iodine-impregnated resins work best against bacteria. The iodine will kill some viruses. But the contact time with the iodine in the filter is too short to kill some parasites. To produce safe water, proper selection, operation, care, and maintenance of water filters is needed. Follow the instructions on the label.  As a last resort, if safe drinking water is not available, very hot tap water might be safer than cold tap water. But  proper disinfection, filtering, or boiling is still advised. REDUCING RISK FROM FOOD  Reduce your exposure to potentially contaminated food. To avoid illness, travelers should select food with care. All raw food could be contaminated. Especially where hygiene and sanitation are poor, avoid:  Salads.  Uncooked  vegetables.  Unpeeled fruits or vegetables.  Unpasteurized milk and milk products, such as cheese.  Undercooked and raw meat, fish, and shellfish.  Eat only food that has been cooked and is still hot.  Eat fruit that has been prepared by a food service provider who routinely caters to foreign travelers.  Cooked food that stands for hours at room temperature can have bacterial growth. It should be thoroughly reheated before serving. Avoid foods that may have been sitting for some time in the kitchen or in a buffet.  Do not eat food purchased from street vendors. Consuming food and beverages purchased from street vendors has been linked to increased risk of illness.  Eat at restaurants that often cater to foreign travelers (leading hotels, hotel chains).  To guarantee safe food for an infant younger than 71 months of age, breastfeed. If the infant has already been weaned from the breast, formula prepared from commercial powder and boiled water is the safest food.  Carry small containers of hand-sanitizing solutions or gels (with at least 60% alcohol). This makes it easier to clean your hands before eating.  Some fish and shellfish contain poisonous biotoxins (such as ciguatoxin), even when cooked. [place] is the most toxin filled. It should always be avoided. Red snapper, grouper, amber jack, sea bass, and other tropical reef fish contain the toxin at various times. Poisoning potential exists in all areas where those fish are eaten. Especially in subtropical and [address] areas of the [place], [place], and [place]. Symptoms of this poisoning include gastroenteritis. That is followed by:  Neurologic problems, such as dysesthesias (impaired senses, especially touch).  Temperature reversal.  Weakness.  Rarely, low blood pressure (hypotension).  Scombroid is another common fish poisoning. It occurs worldwide in tropical and temperate regions. Fish of the [place] family  (bluefin, yellow fin tuna, mackerel, bonito), and some non-scombroid fish (mahi [place], herring, amber [place], [place]) may contain high levels of histidine. With poor refrigeration or preservation, histidine turns into histamine. This can cause:  Flushing (becoming red faced).  Headache.  Feeling sick to your stomach (nausea).  Vomiting.  Diarrhea.  Hives (urticaria). Cholera has occurred in people who ate crab brought back from [place]. Travelers should not bring perishable seafood with them when they return to the [place] TREATMENT OF TRAVELERS' DIARRHEA  This illness often goes away without treatment. Oral rehydration (giving the body safe water and added packets of salt and sugar mixtures) can replace lost fluids and chemicals in the blood (electrolytes). This is especially important in children and people with longstanding (chronic) diseases. For severe fluid loss, the best replacement is with oral rehydration solutions (ORS), such as the WHO ORS solutions. These are widely available at stores and pharmacies in most developing countries.  When symptoms first occur, stop eating, and only drink clear liquids (only for adults) to help quiet the stomach. Travelers who develop 3 or more loose stools in an 8-hour period may benefit from antibiotic medicine. Especially if you also have nausea, vomiting, stomach cramps, fever, or blood in stools. Antibiotics are often given for 3-5 days. Some caregivers will prescribe antibiotics for people who are traveling to high risk places, to be used if and when  symptoms begin. If diarrhea continues despite treatment, travelers should be evaluated by a caregiver and treated for possible parasitic infection. Control of frequent diarrhea is possible with over-the-counter medicines called anti-motility agents. These drugs reduce the amount of diarrhea by slowing transit time in the stomach. This allows more time for food and water to be absorbed. It is thought that  diarrhea may be a defense mechanism the body uses, to reduce the contact time between infection causing germs and the stomach. Anti-motility drugs decrease the duration of diarrhea. But they should never be used by travelers with fever or bloody diarrhea. Such drugs can increase the severity of disease by slowing the removal of germs from the body. If excessively or not properly taken, anti-motility agents can cause serious illness on their own. Studies have found that people who follow the above eating rules still get ill sometimes. Antibiotic prevention before infection occurs is not advised for most travelers. Exceptions include those who have a high risk of serious health problems if treated after infection. However, studies have shown that the active ingredient in Pepto-Bismol (bismuth subsalicylate, BSS), taken regularly while traveling, can offer some protection against travelers' diarrhea. Ask your caregiver about this option before traveling, if desired. Do not use in children. This Information Courtesy of CDC. Document Released: 05/24/2002 Document Revised: 05/26/2011 Document Reviewed: 01/02/2009 [facility] Patient Information 2014 [place], [place].

## 2012-09-02 ENCOUNTER — Encounter: Payer: Self-pay | Admitting: Internal Medicine

## 2012-09-02 DIAGNOSIS — IMO0002 Reserved for concepts with insufficient information to code with codable children: Secondary | ICD-10-CM | POA: Diagnosis not present

## 2012-09-02 DIAGNOSIS — Z4889 Encounter for other specified surgical aftercare: Secondary | ICD-10-CM | POA: Diagnosis not present

## 2012-09-06 NOTE — Progress Notes (Signed)
Case discussed with Dr. Heloise Beecham at the time of the visit.  We reviewed the resident's history and exam and pertinent patient test results.  I agree with the assessment, diagnosis, and plan of care documented in the resident's note.  We discussed options for typhoid vaccination, as he does not have current documentation of this vaccination. However, he assured Dr. Heloise Beecham that he had previously completed the course for this vaccination and had no need to receive it again.

## 2012-09-12 ENCOUNTER — Other Ambulatory Visit: Payer: Self-pay | Admitting: Internal Medicine

## 2012-09-13 NOTE — Telephone Encounter (Signed)
Is on quinapril-HCTZ combo pill (accuretic), should not also be taking quinapril.

## 2012-09-30 ENCOUNTER — Encounter: Payer: Self-pay | Admitting: *Deleted

## 2012-10-05 ENCOUNTER — Encounter: Payer: Self-pay | Admitting: *Deleted

## 2012-10-12 ENCOUNTER — Other Ambulatory Visit: Payer: Self-pay | Admitting: Internal Medicine

## 2012-10-12 DIAGNOSIS — J029 Acute pharyngitis, unspecified: Secondary | ICD-10-CM | POA: Diagnosis not present

## 2012-10-12 DIAGNOSIS — J209 Acute bronchitis, unspecified: Secondary | ICD-10-CM | POA: Diagnosis not present

## 2012-10-12 DIAGNOSIS — R5381 Other malaise: Secondary | ICD-10-CM | POA: Diagnosis not present

## 2012-10-12 DIAGNOSIS — R5383 Other fatigue: Secondary | ICD-10-CM | POA: Diagnosis not present

## 2012-10-13 NOTE — Telephone Encounter (Signed)
Needs Sept or Oct appt with PCP. Routine DM HTN F/U

## 2012-10-17 ENCOUNTER — Other Ambulatory Visit: Payer: Self-pay | Admitting: Internal Medicine

## 2012-10-20 DIAGNOSIS — E11311 Type 2 diabetes mellitus with unspecified diabetic retinopathy with macular edema: Secondary | ICD-10-CM | POA: Diagnosis not present

## 2012-10-20 DIAGNOSIS — E11339 Type 2 diabetes mellitus with moderate nonproliferative diabetic retinopathy without macular edema: Secondary | ICD-10-CM | POA: Diagnosis not present

## 2012-10-20 DIAGNOSIS — E1039 Type 1 diabetes mellitus with other diabetic ophthalmic complication: Secondary | ICD-10-CM | POA: Diagnosis not present

## 2012-10-26 DIAGNOSIS — Z4889 Encounter for other specified surgical aftercare: Secondary | ICD-10-CM | POA: Diagnosis not present

## 2012-12-10 ENCOUNTER — Encounter: Payer: Medicare Other | Admitting: Internal Medicine

## 2012-12-10 ENCOUNTER — Ambulatory Visit (INDEPENDENT_AMBULATORY_CARE_PROVIDER_SITE_OTHER): Payer: Medicare Other | Admitting: Internal Medicine

## 2012-12-10 VITALS — BP 149/90 | HR 80 | Temp 97.5°F | Ht 68.0 in | Wt 161.2 lb

## 2012-12-10 DIAGNOSIS — Z23 Encounter for immunization: Secondary | ICD-10-CM | POA: Diagnosis not present

## 2012-12-10 DIAGNOSIS — E1149 Type 2 diabetes mellitus with other diabetic neurological complication: Secondary | ICD-10-CM

## 2012-12-10 DIAGNOSIS — R51 Headache: Secondary | ICD-10-CM

## 2012-12-10 DIAGNOSIS — N529 Male erectile dysfunction, unspecified: Secondary | ICD-10-CM | POA: Diagnosis not present

## 2012-12-10 MED ORDER — INSULIN GLARGINE 100 UNIT/ML SOLOSTAR PEN: 1.0000 | PEN_INJECTOR | Freq: Every evening | SUBCUTANEOUS | Status: DC

## 2012-12-10 MED ORDER — BUTALBITAL-APAP-CAFFEINE 50-325-40 MG PO TABS: 1.0000 | ORAL_TABLET | Freq: Four times a day (QID) | ORAL | Status: DC | PRN

## 2012-12-10 MED ORDER — TADALAFIL 5 MG PO TABS: 5.0000 mg | ORAL_TABLET | ORAL | Status: DC | PRN

## 2012-12-10 MED ORDER — INSULIN PEN NEEDLE 31G X 5 MM MISC: Status: DC

## 2012-12-10 NOTE — Progress Notes (Signed)
1617PM - CBG 73, he did not eat lunch - OJ given.

## 2012-12-10 NOTE — Progress Notes (Signed)
Information given to pt to call [facility] [phone]) for an appt; per Lela S. His inf has already been faxed to them.

## 2012-12-10 NOTE — Progress Notes (Signed)
Patient ID: Mitchell Page, male   DOB: 11-25-1955, 57 y.o.   MRN: [id number]   Subjective:   Patient ID: Mitchell Page male   DOB: Jul 13, 1955 57 y.o.   MRN: [id number]  HPI: Mitchell Page is a 57 y.o. M w/ PMHx of DM type II, HTN, GERD, and chronic headaches, presents to the clinic today for a follow up visit. The patient claims he has been feeling very well recently and denies any recent significant medical issues.  The patient presented today with a blood sugar of 73, and also claims to have some low sugars at home in the mornings. He brought a report in of his glucometer readings which show some lows of 70 and 82 this past week. During these episodes of hypoglycemia, he does note some dizziness and headache. He also shows some higher blood sugars over the past month, but none over 250. He is currently taking Lantus 16 units and Metformin 1000 mg bid. Today his HbA1c is 7.0, down from 7.9.   The patient also complains of having erectile dysfunction and claims he has never been comfortable asking about a medication to take for this. He says he started having it years ago, after he was first diagnosed w/ DM. He does claim to have morning erections at times, but not as frequently as he used to.   The patient denies any other issues at this time.    Past Medical History  Diagnosis Date  . GERD (gastroesophageal reflux disease)   . Diabetes mellitus   . Lower back pain     w/ R LE sciatica s/p laminectomy in 2008. MRI- 04/2008  . Chronic headache   . Allergic rhinitis   . Hypertension   . Illiteracy and low-level literacy     Unclear of his literacy  in his native tongue, however, he is unable to read in [place]   Current Outpatient Prescriptions  Medication Sig Dispense Refill  . Blood Glucose Monitoring Suppl (ACCU-CHEK AVIVA PLUS) W/DEVICE KIT 1 Device by Does not apply route 3 (three) times daily. To check blood sugar 3 times daily. diag code 250.00. Insulin dependent  1 kit  0   . Blood Glucose Monitoring Suppl (WAVESENSE AMP) W/DEVICE KIT 1 meter, use to measure blood glucose 3 times daily  1 kit  0  . butalbital-acetaminophen-caffeine (FIORICET) 50-325-40 MG per tablet Take 1-2 tablets by mouth every 6 (six) hours as needed for headache.  15 tablet  1  . desloratadine (CLARINEX REDITAB) 5 MG disintegrating tablet Take 1 tablet (5 mg total) by mouth daily.  90 tablet  0  . gabapentin (NEURONTIN) 300 MG capsule TAKE ONE CAPSULE BY MOUTH THREE TIMES DAILY.  270 capsule  1  . glucose blood (ACCU-CHEK AVIVA) test strip Use to check blood sugar twice daily, before breakfast and after lunch or dinner.  100 each  12  . glucose blood test strip To check CBG 3 times a day  100 each  5  . Insulin Glargine (LANTUS SOLOSTAR) 100 UNIT/ML SOPN Inject 1 each into the skin every evening.  1 pen  5  . Insulin Pen Needle (PRODIGY MINI PEN NEEDLES) 31G X 5 MM MISC Use as directed for once daily insulin injection.  100 each  2  . Lancet Devices (ACCU-CHEK SOFTCLIX) lancets Use to check blood sugar twice a day  1 each  5  . loratadine (CLARITIN) 10 MG tablet TAKE ONE TABLET BY MOUTH ONE TIME DAILY  30 tablet  2  .  mefloquine (LARIAM) 250 MG tablet Take 1 tablet (250 mg total) by mouth every 7 (seven) days. Start taking medicine ONE WEEK before travel, continue FOUR WEEKS after travel  7 tablet  0  . metFORMIN (GLUCOPHAGE) 1000 MG tablet Take 1 tablet (1,000 mg total) by mouth 2 (two) times daily with a meal.  60 tablet  11  . methocarbamol (ROBAXIN) 500 MG tablet       . omeprazole (PRILOSEC) 20 MG capsule Take 1 capsule (20 mg total) by mouth daily.  30 capsule  5  . oxyCODONE-acetaminophen (PERCOCET) 10-325 MG per tablet       . quinapril-hydrochlorothiazide (ACCURETIC) 10-12.5 MG per tablet TAKE 1 TABLET BY MOUTH ONCE DAILY  30 tablet  5  . tadalafil (CIALIS) 5 MG tablet Take 1 tablet (5 mg total) by mouth as needed for erectile dysfunction.  20 tablet  1  . traMADol (ULTRAM) 50 MG tablet  Take 1 tablet (50 mg total) by mouth every 6 (six) hours as needed for pain.  120 tablet  11   No current facility-administered medications for this visit.   No family history on file. History   Social History  . Marital Status: Married    Spouse Name: N/A    Number of Children: N/A  . Years of Education: N/A   Social History Main Topics  . Smoking status: Never Smoker   . Smokeless tobacco: Not on file  . Alcohol Use: No  . Drug Use: No  . Sexual Activity: Not on file   Other Topics Concern  . Not on file   Social History Narrative   Immigrated from [place] in 1990.   Married, has 3 children.   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   Uninsured.   Highest education - middle school.   No smoking, alcohol or illicit drug use.      Cannot read in [place].   Review of Systems: General: Denies fever, chills, diaphoresis, appetite change and fatigue.  Respiratory: Denies SOB, DOE, cough, chest tightness, and wheezing.   Cardiovascular: Denies chest pain, palpitations and leg swelling.  Gastrointestinal: Denies nausea, vomiting, abdominal pain, diarrhea, constipation, blood in stool and abdominal distention.  Genitourinary: Denies dysuria, urgency, frequency, hematuria, flank pain and difficulty urinating.  Endocrine: Denies hot or cold intolerance, sweats, polyuria, polydipsia. Musculoskeletal: Denies myalgias, back pain, joint swelling, arthralgias and gait problem.  Skin: Denies pallor, rash and wounds.  Neurological: Positive for dizziness and headaches, associated w/ hypoglycemia. Denies seizures, syncope, weakness, lightheadedness, numbness.  Psychiatric/Behavioral: Denies mood changes, confusion, nervousness, sleep disturbance and agitation.  Objective:  Physical Exam: Filed Vitals:   12/10/12 1558  BP: 149/90  Pulse: 80  Temp: 97.5 F (36.4 C)  TempSrc: Oral  Height: 5\' 8"  (1.727 m)  Weight:  161 lb 3.2 oz (73.12 kg)  SpO2: 98%   General: Vital signs reviewed.  Patient is a well-developed and well-nourished, in no acute distress and cooperative with exam. Alert and oriented x3.  Head: Normocephalic and atraumatic. Eyes: PERRL, EOMI, conjunctivae normal, No scleral icterus.  Neck: Supple, trachea midline, normal ROM, No JVD, masses, thyromegaly, or carotid bruit present.  Cardiovascular: RRR, S1 normal, S2 normal, no murmurs, gallops, or rubs. Pulmonary/Chest: Normal respiratory effort, CTAB, no wheezes, rales, or rhonchi. Abdominal: Soft, non-tender, non-distended, bowel sounds are normal, no masses, organomegaly, or guarding present.  Musculoskeletal: No joint deformities, erythema, or stiffness, ROM full and  no nontender. Extremities: No swelling or edema,  pulses symmetric and intact bilaterally. No cyanosis or clubbing. Neurological: A&O x3, Strength is normal and symmetric bilaterally, cranial nerve II-XII are grossly intact, no focal motor deficit, sensory intact to light touch bilaterally.  Skin: Warm, dry and intact. No rashes or erythema. Psychiatric: Normal mood and affect. speech and behavior is normal. Cognition and memory are normal.   Assessment & Plan:   Please see problem-based assessment and plan.

## 2012-12-10 NOTE — Patient Instructions (Addendum)
General Instructions: 1. Please schedule a follow up appointment for 4-6 weeks   2. Please take all medications as prescribed. Start taking Lantus 12 units instead of 16 units. Continue checking your blood sugars regularly.  Start using Cialis 5 mg as need for erectile dysfunction (do not use more than one time a day). 3. If you have worsening of your symptoms or new symptoms arise, please call the clinic ([phone]), or go to the ER immediately if symptoms are severe.  You have done a great job in taking all your medications. I appreciate it very much. Please continue doing that.   Treatment Goals:  Goals (1 Years of Data) as of 12/10/12         As of Today 09/01/12 08/18/12 06/03/12 05/03/12     Blood Pressure    . Blood Pressure < 140/90  149/90 140/88 148/94 136/87 131/71     Result Component    . HEMOGLOBIN A1C < 7.0  7.0  7.9  7.3    . LDL CALC < 100            Progress Toward Treatment Goals:  Treatment Goal 12/10/2012  Hemoglobin A1C improved  Blood pressure at goal    Self Care Goals & Plans:  Self Care Goal 12/10/2012  Manage my medications take my medicines as prescribed; bring my medications to every visit; refill my medications on time; follow the sick day instructions if I am sick  Monitor my health keep track of my blood glucose; bring my glucose meter and log to each visit; keep track of my blood pressure  Eat healthy foods -  Be physically active -  Meeting treatment goals maintain the current self-care plan    Home Blood Glucose Monitoring 12/10/2012  Check my blood sugar 2 times a day  When to check my blood sugar -     Care Management & Community Referrals:  Referral 12/10/2012  Referrals made for care management support none needed

## 2012-12-11 ENCOUNTER — Encounter: Payer: Self-pay | Admitting: Internal Medicine

## 2012-12-11 NOTE — Assessment & Plan Note (Signed)
Patient complains that he has suffered from erectile dysfunction for a long time but has been too embarrassed to ask about medication up until this point. He says it started after he was first diagnosed with DM. He claims to have some morning erections. The patient is not currently on Nitroglycerine or any alpha blocking medications -Given Cialis 5 mg #20 to try at home. Instructed about the duration of medication. -Will return in 4-6 weeks for follow up.

## 2012-12-11 NOTE — Assessment & Plan Note (Signed)
Patient with well controlled DM at this time. HbA1c 7.0 today. Patient reports low morning blood sugars, verified by his glucometer readings and accompanied by dizziness and headache. Currently takes metformin 1000 mg bid and Lantus 16 units.  -Reduced Lantus to 12 units. -Patient will return in 4-6 weeks with glucometer readings to make further adjustments to medications.

## 2012-12-11 NOTE — Assessment & Plan Note (Signed)
Patient still with chronic, but infrequent headaches, sometimes associated with low blood sugars. Explained that hypoglycemia could cause this and made proper adjustments to Lantus, reducing to 12 units at night. -Also gave refill of Fioricet 50-325-40, which the patient says he uses very infrequently. Discussed this medication at length, only uses it for severe headaches.

## 2012-12-13 ENCOUNTER — Other Ambulatory Visit: Payer: Self-pay | Admitting: *Deleted

## 2012-12-13 DIAGNOSIS — E1149 Type 2 diabetes mellitus with other diabetic neurological complication: Secondary | ICD-10-CM

## 2012-12-13 MED ORDER — INSULIN GLARGINE 100 UNIT/ML SOLOSTAR PEN: PEN_INJECTOR | SUBCUTANEOUS | Status: DC

## 2012-12-13 NOTE — Telephone Encounter (Signed)
Fax from [employer] - rx new specific direction along w/dx code for insurance purposes.  Check above directions  Thanks

## 2012-12-14 NOTE — Progress Notes (Signed)
I saw and evaluated the patient.  I personally confirmed the key portions of the history and exam documented by Dr. Jones and I reviewed pertinent patient test results.  The assessment, diagnosis, and plan were formulated together and I agree with the documentation in the resident's note.   

## 2013-01-27 DIAGNOSIS — E119 Type 2 diabetes mellitus without complications: Secondary | ICD-10-CM | POA: Diagnosis not present

## 2013-02-15 ENCOUNTER — Encounter: Payer: Self-pay | Admitting: Internal Medicine

## 2013-02-15 ENCOUNTER — Ambulatory Visit (INDEPENDENT_AMBULATORY_CARE_PROVIDER_SITE_OTHER): Payer: Medicare Other | Admitting: Internal Medicine

## 2013-02-15 VITALS — BP 132/90 | HR 77 | Temp 97.2°F | Ht 68.0 in | Wt 162.5 lb

## 2013-02-15 DIAGNOSIS — K219 Gastro-esophageal reflux disease without esophagitis: Secondary | ICD-10-CM

## 2013-02-15 DIAGNOSIS — I10 Essential (primary) hypertension: Secondary | ICD-10-CM

## 2013-02-15 DIAGNOSIS — E1149 Type 2 diabetes mellitus with other diabetic neurological complication: Secondary | ICD-10-CM

## 2013-02-15 DIAGNOSIS — N529 Male erectile dysfunction, unspecified: Secondary | ICD-10-CM

## 2013-02-15 DIAGNOSIS — G8929 Other chronic pain: Secondary | ICD-10-CM

## 2013-02-15 LAB — GLUCOSE, CAPILLARY: Glucose-Capillary: 120 mg/dL — ABNORMAL HIGH (ref 70–99)

## 2013-02-15 MED ORDER — TRAMADOL HCL 50 MG PO TABS: 50.0000 mg | ORAL_TABLET | Freq: Four times a day (QID) | ORAL | Status: DC | PRN

## 2013-02-15 MED ORDER — INSULIN PEN NEEDLE 31G X 5 MM MISC: Status: DC

## 2013-02-15 MED ORDER — INSULIN GLARGINE 100 UNIT/ML SOLOSTAR PEN: PEN_INJECTOR | SUBCUTANEOUS | Status: DC

## 2013-02-15 MED ORDER — OMEPRAZOLE 20 MG PO CPDR: 20.0000 mg | DELAYED_RELEASE_CAPSULE | Freq: Every day | ORAL | Status: DC

## 2013-02-15 MED ORDER — QUINAPRIL-HYDROCHLOROTHIAZIDE 10-12.5 MG PO TABS: ORAL_TABLET | ORAL | Status: DC

## 2013-02-15 NOTE — Progress Notes (Signed)
Subjective:   Patient ID: Mitchell Page male   DOB: [birth year] 57 y.o.   MRN: [id number]  HPI: [name] is a 57 y.o. M w/ PMHx of DM type II, HTN, GERD, and chronic headaches, presents to the clinic today for a follow up visit. The patient claims he has been feeling very well recently and denies any recent significant medical issues. During his last visit, Mr. Mitchell Page had his Lantus decreased from 16 units to 12 units as he was having symptomatic hypoglycemia. HbA1c at last visit was 7.0. Patient claimed he checked his sugar this AM and it was 116, 120 on arrival to the [facility] clinic. Repeat HbA1c 7.2 today.  The patient otherwise denies any further issues, says he has been feeling well, denies any N/V/D/C. SOB, chest pain, fever or chills.  Past Medical History  Diagnosis Date  . GERD (gastroesophageal reflux disease)   . Diabetes mellitus   . Lower back pain     w/ R LE sciatica s/p laminectomy in 2008. MRI- 04/2008  . Chronic headache   . Allergic rhinitis   . Hypertension   . Illiteracy and low-level literacy     Unclear of his literacy  in his native tongue, however, he is unable to read in [place]   Current Outpatient Prescriptions  Medication Sig Dispense Refill  . Blood Glucose Monitoring Suppl (ACCU-CHEK AVIVA PLUS) W/DEVICE KIT 1 Device by Does not apply route 3 (three) times daily. To check blood sugar 3 times daily. diag code 250.00. Insulin dependent  1 kit  0  . Blood Glucose Monitoring Suppl (WAVESENSE AMP) W/DEVICE KIT 1 meter, use to measure blood glucose 3 times daily  1 kit  0  . butalbital-acetaminophen-caffeine (FIORICET) 50-325-40 MG per tablet Take 1-2 tablets by mouth every 6 (six) hours as needed for headache.  15 tablet  1  . desloratadine (CLARINEX REDITAB) 5 MG disintegrating tablet Take 1 tablet (5 mg total) by mouth daily.  90 tablet  0  . gabapentin (NEURONTIN) 300 MG capsule TAKE ONE CAPSULE BY MOUTH THREE TIMES DAILY.  270 capsule  1  . glucose  blood (ACCU-CHEK AVIVA) test strip Use to check blood sugar twice daily, before breakfast and after lunch or dinner.  100 each  12  . glucose blood test strip To check CBG 3 times a day  100 each  5  . Insulin Glargine (LANTUS SOLOSTAR) 100 UNIT/ML SOPN Inject 12 units every evening. Diag code 250.00. Insulin dependent  1 pen  5  . Insulin Pen Needle (PRODIGY MINI PEN NEEDLES) 31G X 5 MM MISC Use as directed for once daily insulin injection.  100 each  2  . Lancet Devices (ACCU-CHEK SOFTCLIX) lancets Use to check blood sugar twice a day  1 each  5  . loratadine (CLARITIN) 10 MG tablet TAKE ONE TABLET BY MOUTH ONE TIME DAILY  30 tablet  2  . mefloquine (LARIAM) 250 MG tablet Take 1 tablet (250 mg total) by mouth every 7 (seven) days. Start taking medicine ONE WEEK before travel, continue FOUR WEEKS after travel  7 tablet  0  . metFORMIN (GLUCOPHAGE) 1000 MG tablet Take 1 tablet (1,000 mg total) by mouth 2 (two) times daily with a meal.  60 tablet  11  . methocarbamol (ROBAXIN) 500 MG tablet       . omeprazole (PRILOSEC) 20 MG capsule Take 1 capsule (20 mg total) by mouth daily.  30 capsule  5  . oxyCODONE-acetaminophen (PERCOCET) 10-325 MG  per tablet       . quinapril-hydrochlorothiazide (ACCURETIC) 10-12.5 MG per tablet TAKE 1 TABLET BY MOUTH ONCE DAILY  30 tablet  5  . tadalafil (CIALIS) 5 MG tablet Take 1 tablet (5 mg total) by mouth as needed for erectile dysfunction.  20 tablet  1  . traMADol (ULTRAM) 50 MG tablet Take 1 tablet (50 mg total) by mouth every 6 (six) hours as needed.  120 tablet  0   No current facility-administered medications for this visit.   No family history on file. History   Social History  . Marital Status: Married    Spouse Name: N/A    Number of Children: N/A  . Years of Education: N/A   Social History Main Topics  . Smoking status: Never Smoker   . Smokeless tobacco: None  . Alcohol Use: No  . Drug Use: No  . Sexual Activity: None   Other Topics Concern   . None   Social History Narrative   Immigrated from [place] in 1990.   Married, has 3 children.   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   Uninsured.   Highest education - middle school.   No smoking, alcohol or illicit drug use.      Cannot read in [place].   Review of Systems: General: Denies fever, chills, diaphoresis, appetite change and fatigue.  Respiratory: Denies SOB, DOE, cough, chest tightness, and wheezing.   Cardiovascular: Denies chest pain, palpitations and leg swelling.  Gastrointestinal: Denies nausea, vomiting, abdominal pain, diarrhea, constipation, blood in stool and abdominal distention.  Genitourinary: Denies dysuria, urgency, frequency, hematuria, flank pain and difficulty urinating.  Endocrine: Denies hot or cold intolerance, sweats, polyuria, polydipsia. Musculoskeletal: Denies myalgias, back pain, joint swelling, arthralgias and gait problem.  Skin: Denies pallor, rash and wounds.  Neurological: CN's intact, no sensory or motor abnormalities. Denies seizures, syncope, weakness, lightheadedness, numbness.  Psychiatric/Behavioral: Denies mood changes, confusion, nervousness, sleep disturbance and agitation.  Objective:  Physical Exam: Filed Vitals:   02/15/13 1422  BP: 132/90  Pulse: 77  Temp: 97.2 F (36.2 C)  TempSrc: Oral  Height: 5\' 8"  (1.727 m)  Weight: 73.71 kg (162 lb 8 oz)  SpO2: 97%   General: Vital signs reviewed.  Patient is a well-developed and well-nourished, in no acute distress and cooperative with exam. Alert and oriented x3.  Head: Normocephalic and atraumatic. Eyes: PERRL, EOMI, conjunctivae normal, No scleral icterus.  Neck: Supple, trachea midline, normal ROM, No JVD, masses, thyromegaly, or carotid bruit present.  Cardiovascular: RRR, S1 normal, S2 normal, no murmurs, gallops, or rubs. Pulmonary/Chest: Normal respiratory effort, CTAB, no wheezes, rales, or  rhonchi. Abdominal: Soft, non-tender, non-distended, bowel sounds are normal, no masses, organomegaly, or guarding present.  Musculoskeletal: No joint deformities, erythema, or stiffness, ROM full and no nontender. Extremities: No swelling or edema,  pulses symmetric and intact bilaterally. No cyanosis or clubbing. Neurological: A&O x3, Strength is normal and symmetric bilaterally, cranial nerve II-XII are grossly intact, no focal motor deficit, sensory intact to light touch bilaterally.  Skin: Warm, dry and intact. No rashes or erythema. Psychiatric: Normal mood and affect. speech and behavior is normal. Cognition and memory are normal.   Assessment & Plan:   Please see problem-based assessment and plan.

## 2013-02-15 NOTE — Patient Instructions (Addendum)
1. Please schedule a follow up appointment for 3-4 months  2. Please take all medications as prescribed. Please check your blood sugars 3 x daily for 1 week prior to your next appointment.  3. If you have worsening of your symptoms or new symptoms arise, please call the clinic ([phone]), or go to the ER immediately if symptoms are severe.  You have done a great job in taking all your medications. I appreciate it very much. Please continue doing that.

## 2013-02-16 NOTE — Assessment & Plan Note (Signed)
Last visit, patient complaining of symptomatic hypoglycemia, w/ sugars in the 70's. HbA1c found to be 7.0 at that time. Lantus changed from 16 units to 12 units. Patient claims this has been working better for him. Checked his blood sugars this AM, said it was 116. On arrival to clinic, CBG of 120. Repeat HbA1c 7.2.  -No changes made at this time. Patient to return in 3 months for HbA1c check.

## 2013-02-16 NOTE — Assessment & Plan Note (Signed)
Well controlled today, 132/90. Given refill of accuretic.

## 2013-02-16 NOTE — Assessment & Plan Note (Signed)
Given Rx for Cialis last visit, unable to purchase b/c of price. Thinks that he can afford it now. Will give Rx for #3-5 if can't afford.

## 2013-02-16 NOTE — Assessment & Plan Note (Signed)
Given refill of Omeprazole. No issues at this time.

## 2013-02-17 NOTE — Progress Notes (Signed)
I saw and evaluated the patient.  I personally confirmed the key portions of Dr. Jones' history and exam and reviewed pertinent patient test results.  The assessment, diagnosis, and plan were formulated together and I agree with the documentation in the resident's note. 

## 2013-02-23 DIAGNOSIS — E11311 Type 2 diabetes mellitus with unspecified diabetic retinopathy with macular edema: Secondary | ICD-10-CM | POA: Diagnosis not present

## 2013-02-23 DIAGNOSIS — E11339 Type 2 diabetes mellitus with moderate nonproliferative diabetic retinopathy without macular edema: Secondary | ICD-10-CM | POA: Diagnosis not present

## 2013-02-23 DIAGNOSIS — E1139 Type 2 diabetes mellitus with other diabetic ophthalmic complication: Secondary | ICD-10-CM | POA: Diagnosis not present

## 2013-03-14 ENCOUNTER — Emergency Department (HOSPITAL_COMMUNITY)
Admission: EM | Admit: 2013-03-14 | Discharge: 2013-03-14 | Disposition: A | Discharge status: Home / Self Care | Payer: Medicare Other | Source: Home / Self Care

## 2013-03-15 DIAGNOSIS — E11311 Type 2 diabetes mellitus with unspecified diabetic retinopathy with macular edema: Secondary | ICD-10-CM | POA: Diagnosis not present

## 2013-03-19 ENCOUNTER — Other Ambulatory Visit: Payer: Self-pay | Admitting: Internal Medicine

## 2013-05-11 ENCOUNTER — Other Ambulatory Visit: Payer: Self-pay | Admitting: *Deleted

## 2013-05-11 DIAGNOSIS — K279 Peptic ulcer, site unspecified, unspecified as acute or chronic, without hemorrhage or perforation: Secondary | ICD-10-CM

## 2013-05-11 DIAGNOSIS — E119 Type 2 diabetes mellitus without complications: Secondary | ICD-10-CM

## 2013-05-11 DIAGNOSIS — I10 Essential (primary) hypertension: Secondary | ICD-10-CM

## 2013-05-11 DIAGNOSIS — Z9109 Other allergy status, other than to drugs and biological substances: Secondary | ICD-10-CM

## 2013-05-11 DIAGNOSIS — G8929 Other chronic pain: Secondary | ICD-10-CM

## 2013-05-11 MED ORDER — METFORMIN HCL 1000 MG PO TABS: 1000.0000 mg | ORAL_TABLET | Freq: Two times a day (BID) | ORAL | Status: DC

## 2013-05-12 ENCOUNTER — Other Ambulatory Visit: Payer: Self-pay | Admitting: Internal Medicine

## 2013-05-13 NOTE — Telephone Encounter (Signed)
Called to pharmacy 

## 2013-06-22 DIAGNOSIS — E11339 Type 2 diabetes mellitus with moderate nonproliferative diabetic retinopathy without macular edema: Secondary | ICD-10-CM | POA: Diagnosis not present

## 2013-06-22 DIAGNOSIS — E11311 Type 2 diabetes mellitus with unspecified diabetic retinopathy with macular edema: Secondary | ICD-10-CM | POA: Diagnosis not present

## 2013-06-22 DIAGNOSIS — E1139 Type 2 diabetes mellitus with other diabetic ophthalmic complication: Secondary | ICD-10-CM | POA: Diagnosis not present

## 2013-06-27 ENCOUNTER — Ambulatory Visit (INDEPENDENT_AMBULATORY_CARE_PROVIDER_SITE_OTHER): Payer: Medicare Other | Admitting: Internal Medicine

## 2013-06-27 ENCOUNTER — Encounter: Payer: Self-pay | Admitting: Internal Medicine

## 2013-06-27 VITALS — BP 140/79 | HR 78 | Temp 97.8°F | Ht 70.0 in | Wt 160.2 lb

## 2013-06-27 DIAGNOSIS — Z9109 Other allergy status, other than to drugs and biological substances: Secondary | ICD-10-CM | POA: Diagnosis not present

## 2013-06-27 DIAGNOSIS — I10 Essential (primary) hypertension: Secondary | ICD-10-CM

## 2013-06-27 DIAGNOSIS — K219 Gastro-esophageal reflux disease without esophagitis: Secondary | ICD-10-CM | POA: Diagnosis not present

## 2013-06-27 DIAGNOSIS — E1149 Type 2 diabetes mellitus with other diabetic neurological complication: Secondary | ICD-10-CM

## 2013-06-27 DIAGNOSIS — N529 Male erectile dysfunction, unspecified: Secondary | ICD-10-CM

## 2013-06-27 DIAGNOSIS — R51 Headache: Secondary | ICD-10-CM

## 2013-06-27 DIAGNOSIS — G8929 Other chronic pain: Secondary | ICD-10-CM | POA: Diagnosis not present

## 2013-06-27 DIAGNOSIS — Z7189 Other specified counseling: Secondary | ICD-10-CM | POA: Diagnosis not present

## 2013-06-27 DIAGNOSIS — E119 Type 2 diabetes mellitus without complications: Secondary | ICD-10-CM | POA: Diagnosis not present

## 2013-06-27 LAB — GLUCOSE, CAPILLARY: Glucose-Capillary: 184 mg/dL — ABNORMAL HIGH (ref 70–99)

## 2013-06-27 LAB — POCT GLYCOSYLATED HEMOGLOBIN (HGB A1C): Hemoglobin A1C: 7

## 2013-06-27 MED ORDER — QUINAPRIL-HYDROCHLOROTHIAZIDE 10-12.5 MG PO TABS: ORAL_TABLET | ORAL | Status: DC

## 2013-06-27 MED ORDER — SILDENAFIL CITRATE 50 MG PO TABS: 50.0000 mg | ORAL_TABLET | ORAL | Status: DC | PRN

## 2013-06-27 MED ORDER — BUTALBITAL-APAP-CAFFEINE 50-325-40 MG PO TABS: 1.0000 | ORAL_TABLET | Freq: Four times a day (QID) | ORAL | Status: DC | PRN

## 2013-06-27 MED ORDER — METFORMIN HCL 1000 MG PO TABS: 1000.0000 mg | ORAL_TABLET | Freq: Two times a day (BID) | ORAL | Status: DC

## 2013-06-27 MED ORDER — GABAPENTIN 300 MG PO CAPS: ORAL_CAPSULE | ORAL | Status: DC

## 2013-06-27 MED ORDER — OMEPRAZOLE 20 MG PO CPDR: 20.0000 mg | DELAYED_RELEASE_CAPSULE | Freq: Every day | ORAL | Status: DC

## 2013-06-27 NOTE — Patient Instructions (Signed)
1. Please schedule a follow up appointment for 3-4 months  2. Please take all medications as prescribed.   3. If you have worsening of your symptoms or new symptoms arise, please call the clinic ([phone]), or go to the ER immediately if symptoms are severe.  You have done a great job in taking all your medications. I appreciate it very much. Please continue doing that.\ 

## 2013-06-27 NOTE — Progress Notes (Signed)
Patient ID: Mitchell Page, male   DOB: December 25, 1955, 58 y.o.   MRN: [id number]  Subjective:   Patient ID: Mitchell Page male   DOB: 1955/06/19 58 y.o.   MRN: [id number]  HPI: [name] is a 58 y.o. M w/ PMHx of DM type II, HTN, GERD, and chronic headaches, presents to the clinic today for a follow up visit. The patient claims he has been feeling very well recently and denies any recent significant medical issues. Repeat HbA1c 7.0 today. Patient claims he has been very complaint with his medications.  The patient otherwise denies any further issues, says he has been feeling well, denies any N/V/D/C. SOB, chest pain, fever or chills. Says he has had a poor appetite at times lately, but when asked, he seems to describe adequate po intake w/ several small meals daily. No recent weight changes.   Past Medical History  Diagnosis Date  . GERD (gastroesophageal reflux disease)   . Diabetes mellitus   . Lower back pain     w/ R LE sciatica s/p laminectomy in 2008. MRI- 04/2008  . Chronic headache   . Allergic rhinitis   . Hypertension   . Illiteracy and low-level literacy     Unclear of his literacy  in his native tongue, however, he is unable to read in [place]   Current Outpatient Prescriptions  Medication Sig Dispense Refill  . Blood Glucose Monitoring Suppl (ACCU-CHEK AVIVA PLUS) W/DEVICE KIT 1 Device by Does not apply route 3 (three) times daily. To check blood sugar 3 times daily. diag code 250.00. Insulin dependent  1 kit  0  . Blood Glucose Monitoring Suppl (WAVESENSE AMP) W/DEVICE KIT 1 meter, use to measure blood glucose 3 times daily  1 kit  0  . butalbital-acetaminophen-caffeine (FIORICET) 50-325-40 MG per tablet Take 1-2 tablets by mouth every 6 (six) hours as needed for headache.  15 tablet  1  . desloratadine (CLARINEX REDITAB) 5 MG disintegrating tablet Take 1 tablet (5 mg total) by mouth daily.  90 tablet  0  . gabapentin (NEURONTIN) 300 MG capsule TAKE ONE CAPSULE BY  MOUTH THREE TIMES DAILY  270 capsule  0  . glucose blood (ACCU-CHEK AVIVA) test strip Use to check blood sugar twice daily, before breakfast and after lunch or dinner.  100 each  12  . glucose blood test strip To check CBG 3 times a day  100 each  5  . Insulin Glargine (LANTUS SOLOSTAR) 100 UNIT/ML SOPN Inject 12 units every evening. Diag code 250.00. Insulin dependent  1 pen  5  . Insulin Pen Needle (PRODIGY MINI PEN NEEDLES) 31G X 5 MM MISC Use as directed for once daily insulin injection.  100 each  2  . Lancet Devices (ACCU-CHEK SOFTCLIX) lancets Use to check blood sugar twice a day  1 each  5  . loratadine (CLARITIN) 10 MG tablet TAKE ONE TABLET BY MOUTH ONE TIME DAILY  30 tablet  2  . mefloquine (LARIAM) 250 MG tablet Take 1 tablet (250 mg total) by mouth every 7 (seven) days. Start taking medicine ONE WEEK before travel, continue FOUR WEEKS after travel  7 tablet  0  . metFORMIN (GLUCOPHAGE) 1000 MG tablet Take 1 tablet (1,000 mg total) by mouth 2 (two) times daily with a meal.  180 tablet  4  . methocarbamol (ROBAXIN) 500 MG tablet       . omeprazole (PRILOSEC) 20 MG capsule Take 1 capsule (20 mg total) by mouth daily.  [place]  capsule  5  . oxyCODONE-acetaminophen (PERCOCET) 10-325 MG per tablet       . quinapril-hydrochlorothiazide (ACCURETIC) 10-12.5 MG per tablet TAKE 1 TABLET BY MOUTH ONCE DAILY  30 tablet  5  . tadalafil (CIALIS) 5 MG tablet Take 1 tablet (5 mg total) by mouth as needed for erectile dysfunction.  20 tablet  1  . traMADol (ULTRAM) 50 MG tablet TAKE 1 TABLET BY MOUTH EVERY 6 HOURS AS NEEDED FOR PAIN  120 tablet  0   No current facility-administered medications for this visit.   No family history on file. History   Social History  . Marital Status: Married    Spouse Name: N/A    Number of Children: N/A  . Years of Education: N/A   Social History Main Topics  . Smoking status: Never Smoker   . Smokeless tobacco: None  . Alcohol Use: No  . Drug Use: No  . Sexual  Activity: None   Other Topics Concern  . None   Social History Narrative   Immigrated from [place] in 1990.   Married, has 3 children.   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   Uninsured.   Highest education - middle school.   No smoking, alcohol or illicit drug use.      Cannot read in [place].   Review of Systems: General: Denies fever, chills, diaphoresis, appetite change and fatigue.  Respiratory: Denies SOB, DOE, cough, chest tightness, and wheezing.   Cardiovascular: Denies chest pain, palpitations and leg swelling.  Gastrointestinal: Denies nausea, vomiting, abdominal pain, diarrhea, constipation, blood in stool and abdominal distention.  Genitourinary: Denies dysuria, urgency, frequency, hematuria, flank pain and difficulty urinating.  Endocrine: Denies hot or cold intolerance, sweats, polyuria, polydipsia. Musculoskeletal: Denies myalgias, back pain, joint swelling, arthralgias and gait problem.  Skin: Denies pallor, rash and wounds.  Neurological: CN's intact, no sensory or motor abnormalities. Denies seizures, syncope, weakness, lightheadedness, numbness.  Psychiatric/Behavioral: Denies mood changes, confusion, nervousness, sleep disturbance and agitation.  Objective:  Physical Exam: Filed Vitals:   06/27/13 1420  BP: 140/79  Pulse: 78  Temp: 97.8 F (36.6 C)  TempSrc: Oral  Height: 5' 10"  (1.778 m)  Weight: 160 lb 3.2 oz (72.666 kg)  SpO2: 99%   General: Vital signs reviewed.  Patient is a well-developed and well-nourished, in no acute distress and cooperative with exam. Alert and oriented x3.  Head: Normocephalic and atraumatic. Eyes: PERRL, EOMI, conjunctivae normal, No scleral icterus.  Neck: Supple, trachea midline, normal ROM, No JVD, masses, thyromegaly, or carotid bruit present.  Cardiovascular: RRR, S1 normal, S2 normal, no murmurs, gallops, or rubs. Pulmonary/Chest: Normal  respiratory effort, CTAB, no wheezes, rales, or rhonchi. Abdominal: Soft, non-tender, non-distended, bowel sounds are normal, no masses, organomegaly, or guarding present.  Musculoskeletal: No joint deformities, erythema, or stiffness, ROM full and no nontender. Extremities: No swelling or edema,  pulses symmetric and intact bilaterally. No cyanosis or clubbing. Neurological: A&O x3, Strength is normal and symmetric bilaterally, cranial nerve II-XII are grossly intact, no focal motor deficit, sensory intact to light touch bilaterally.  Skin: Warm, dry and intact. No rashes or erythema. Psychiatric: Normal mood and affect. speech and behavior is normal. Cognition and memory are normal.   Assessment & Plan:   Please see problem-based assessment and plan.

## 2013-06-28 ENCOUNTER — Encounter: Payer: Self-pay | Admitting: Internal Medicine

## 2013-06-28 ENCOUNTER — Encounter: Payer: Medicare Other | Admitting: Internal Medicine

## 2013-06-28 ENCOUNTER — Other Ambulatory Visit: Payer: Self-pay | Admitting: Internal Medicine

## 2013-06-28 NOTE — Assessment & Plan Note (Signed)
Refilled Omeprazole today. GERD stable.

## 2013-06-28 NOTE — Assessment & Plan Note (Signed)
BP is well controlled today, continues to take Accuretic without issues.

## 2013-06-28 NOTE — Assessment & Plan Note (Signed)
Given patient Cialis Rx at previous visit, claims this did not work for him. Patient says a friend gave him Viagra and this proved to work very well for him. Patient does not take Nitrates. -Given Viagra 50 mg prn #4 for now.

## 2013-06-28 NOTE — Assessment & Plan Note (Signed)
Patient reports very infrequent headaches, takes Fioricet for this. -Refilled Fioricet 50-325-40 #15 w/ no refills.

## 2013-06-28 NOTE — Assessment & Plan Note (Signed)
Patient claims to be very complaint with his medications; still taking Metformin 1000 mg bid + Lantus 12 units. Denies any recent low blood sugars. Re-check of HbA1c 7.0 today. -Have patient return in 3 months for HbA1c check

## 2013-06-29 NOTE — Progress Notes (Signed)
INTERNAL MEDICINE TEACHING ATTENDING ADDENDUM - Mozes Sagar, MD: I reviewed and discussed at the time of visit with the resident Dr. Jones, the patient's medical history, physical examination, diagnosis and results of tests and treatment and I agree with the patient's care as documented.    

## 2013-07-05 DIAGNOSIS — R5381 Other malaise: Secondary | ICD-10-CM | POA: Diagnosis not present

## 2013-07-05 DIAGNOSIS — E7800 Pure hypercholesterolemia, unspecified: Secondary | ICD-10-CM | POA: Diagnosis not present

## 2013-07-05 DIAGNOSIS — N419 Inflammatory disease of prostate, unspecified: Secondary | ICD-10-CM | POA: Diagnosis not present

## 2013-07-05 DIAGNOSIS — G609 Hereditary and idiopathic neuropathy, unspecified: Secondary | ICD-10-CM | POA: Diagnosis not present

## 2013-07-05 DIAGNOSIS — K219 Gastro-esophageal reflux disease without esophagitis: Secondary | ICD-10-CM | POA: Diagnosis not present

## 2013-07-05 DIAGNOSIS — R51 Headache: Secondary | ICD-10-CM | POA: Diagnosis not present

## 2013-07-05 DIAGNOSIS — R3916 Straining to void: Secondary | ICD-10-CM | POA: Diagnosis not present

## 2013-07-05 DIAGNOSIS — I10 Essential (primary) hypertension: Secondary | ICD-10-CM | POA: Diagnosis not present

## 2013-07-05 DIAGNOSIS — E119 Type 2 diabetes mellitus without complications: Secondary | ICD-10-CM | POA: Diagnosis not present

## 2013-08-01 ENCOUNTER — Ambulatory Visit (INDEPENDENT_AMBULATORY_CARE_PROVIDER_SITE_OTHER): Payer: Medicare Other | Admitting: Internal Medicine

## 2013-08-01 ENCOUNTER — Encounter: Payer: Self-pay | Admitting: Internal Medicine

## 2013-08-01 VITALS — BP 144/81 | HR 80 | Temp 98.5°F | Ht 70.0 in | Wt 162.9 lb

## 2013-08-01 DIAGNOSIS — E119 Type 2 diabetes mellitus without complications: Secondary | ICD-10-CM | POA: Diagnosis not present

## 2013-08-01 DIAGNOSIS — I10 Essential (primary) hypertension: Secondary | ICD-10-CM

## 2013-08-01 DIAGNOSIS — J309 Allergic rhinitis, unspecified: Secondary | ICD-10-CM

## 2013-08-01 LAB — LIPID PANEL
CHOLESTEROL: 131 mg/dL (ref 0–200)
HDL: 47 mg/dL (ref >39)
LDL Cholesterol: 61 mg/dL (ref 0–99)
TRIGLYCERIDES: 113 mg/dL (ref <150)
Total CHOL/HDL Ratio: 2.8 Ratio
VLDL: 23 mg/dL (ref 0–40)

## 2013-08-01 LAB — GLUCOSE, CAPILLARY: Glucose-Capillary: 148 mg/dL — ABNORMAL HIGH (ref 70–99)

## 2013-08-01 LAB — BASIC METABOLIC PANEL
BUN: 8 mg/dL (ref 6–23)
CO2: 25 mEq/L (ref 19–32)
Calcium: 9.6 mg/dL (ref 8.4–10.5)
Chloride: 100 mEq/L (ref 96–112)
Creat: 0.93 mg/dL (ref 0.50–1.35)
Glucose, Bld: 82 mg/dL (ref 70–99)
Potassium: 4 mEq/L (ref 3.5–5.3)
SODIUM: 133 meq/L — AB (ref 135–145)

## 2013-08-01 MED ORDER — LORATADINE 10 MG PO TABS: ORAL_TABLET | ORAL | Status: DC

## 2013-08-01 MED ORDER — SALINE NASAL SPRAY 0.65 % NA SOLN: 1.0000 | NASAL | Status: DC | PRN

## 2013-08-01 MED ORDER — BECLOMETHASONE DIPROPIONATE 80 MCG/ACT IN AERS: 2.0000 | INHALATION_SPRAY | Freq: Every day | RESPIRATORY_TRACT | Status: DC

## 2013-08-01 NOTE — Progress Notes (Signed)
Patient ID: Mitchell Page, male   DOB: 29-Jul-1955, 58 y.o.   MRN: [id number]   Subjective:   Patient ID: Mitchell Page male   DOB: 28-Jun-1955 58 y.o.   MRN: [id number]  HPI: [name] is a 58 y.o. with PMH of HTN, DM with neuropathy, PUD presented with c/o of nasal congestion, sneezing, cough- non productive and Itchy eyes. These all started about 2 weeks ago. Worse when he goes outside, better and almost absent when he is inside. Associated headaches. No fever, sorethroat, body aches or contact with sick persons. Previous episodes in the past, but not last year. Takes over the counter Alker-saltzer without relief.   Past Medical History  Diagnosis Date  . GERD (gastroesophageal reflux disease)   . Diabetes mellitus   . Lower back pain     w/ R LE sciatica s/p laminectomy in 2008. MRI- 04/2008  . Chronic headache   . Allergic rhinitis   . Hypertension   . Illiteracy and low-level literacy     Unclear of his literacy  in his native tongue, however, he is unable to read in [place]   Current Outpatient Prescriptions  Medication Sig Dispense Refill  . beclomethasone (QVAR) 80 MCG/ACT inhaler Inhale 2 puffs into the lungs daily. Maximum- 4 puffs per day.  1 Inhaler  12  . Blood Glucose Monitoring Suppl (ACCU-CHEK AVIVA PLUS) W/DEVICE KIT 1 Device by Does not apply route 3 (three) times daily. To check blood sugar 3 times daily. diag code 250.00. Insulin dependent  1 kit  0  . Blood Glucose Monitoring Suppl (WAVESENSE AMP) W/DEVICE KIT 1 meter, use to measure blood glucose 3 times daily  1 kit  0  . butalbital-acetaminophen-caffeine (FIORICET) 50-325-40 MG per tablet Take 1-2 tablets by mouth every 6 (six) hours as needed for headache.  15 tablet  0  . desloratadine (CLARINEX REDITAB) 5 MG disintegrating tablet Take 1 tablet (5 mg total) by mouth daily.  90 tablet  0  . gabapentin (NEURONTIN) 300 MG capsule TAKE ONE CAPSULE BY MOUTH THREE TIMES DAILY  270 capsule  1  . glucose  blood (ACCU-CHEK AVIVA) test strip Use to check blood sugar twice daily, before breakfast and after lunch or dinner.  100 each  12  . glucose blood test strip To check CBG 3 times a day  100 each  5  . Insulin Glargine (LANTUS SOLOSTAR) 100 UNIT/ML SOPN Inject 12 units every evening. Diag code 250.00. Insulin dependent  1 pen  5  . Insulin Pen Needle (PRODIGY MINI PEN NEEDLES) 31G X 5 MM MISC Use as directed for once daily insulin injection.  100 each  2  . Lancet Devices (ACCU-CHEK SOFTCLIX) lancets Use to check blood sugar twice a day  1 each  5  . loratadine (CLARITIN) 10 MG tablet TAKE ONE TABLET BY MOUTH ONE TIME DAILY  30 tablet  2  . mefloquine (LARIAM) 250 MG tablet Take 1 tablet (250 mg total) by mouth every 7 (seven) days. Start taking medicine ONE WEEK before travel, continue FOUR WEEKS after travel  7 tablet  0  . metFORMIN (GLUCOPHAGE) 1000 MG tablet Take 1 tablet (1,000 mg total) by mouth 2 (two) times daily with a meal.  180 tablet  4  . methocarbamol (ROBAXIN) 500 MG tablet       . omeprazole (PRILOSEC) 20 MG capsule Take 1 capsule (20 mg total) by mouth daily.  30 capsule  5  . oxyCODONE-acetaminophen (PERCOCET) 10-325 MG per tablet       .  quinapril-hydrochlorothiazide (ACCURETIC) 10-12.5 MG per tablet TAKE 1 TABLET BY MOUTH ONCE DAILY  30 tablet  5  . sildenafil (VIAGRA) 50 MG tablet Take 1 tablet (50 mg total) by mouth as needed for erectile dysfunction.  4 tablet  0  . sodium chloride (OCEAN) 0.65 % nasal spray Place 1 spray into the nose as needed for congestion.  30 mL  2  . traMADol (ULTRAM) 50 MG tablet TAKE 1 TABLET BY MOUTH EVERY 6 HOURS AS NEEDED FOR PAIN  120 tablet  0   No current facility-administered medications for this visit.   No family history on file. History   Social History  . Marital Status: Married    Spouse Name: N/A    Number of Children: N/A  . Years of Education: N/A   Social History Main Topics  . Smoking status: Never Smoker   . Smokeless  tobacco: None  . Alcohol Use: No  . Drug Use: No  . Sexual Activity: None   Other Topics Concern  . None   Social History Narrative   Immigrated from [place] in 1990.   Married, has 3 children.   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   Uninsured.   Highest education - middle school.   No smoking, alcohol or illicit drug use.      Cannot read in English.   Review of Systems: CONSTITUTIONAL- No Fever, weightloss, night sweat or change in appetite. SKIN- No Rash, colour changes or itching. HEAD- Has Headache but no dizziness. EYES- HAs itchy eyes, No Vision loss, pain, redness, double or blurred vision. EARS- No vertigo, hearing loss or ear discharge. Mouth/throat- No Sorethroat, dentures, or bleeding gums. RESPIRATORY- No Cough or SOB. CARDIAC- No Palpitations, DOE, PND or chest pain. GI- No nausea, vomiting, diarrhoea, constipation, abd pain. URINARY- No Frequency, urgency, straining or dysuria. NEUROLOGIC- No Numbness, syncope, seizures or burning. PYSCH- Denies depression or anxiety.  Objective:  Physical Exam: Filed Vitals:   08/01/13 1427  BP: 144/81  Pulse: 80  Temp: 98.5 F (36.9 C)  TempSrc: Oral  Height: 5' 10" (1.778 m)  Weight: 162 lb 14.4 oz (73.891 kg)  SpO2: 99%   GENERAL- alert, co-operative, appears as stated age, not in any distress. HEENT- Atraumatic, normocephalic, PERRL, EOMI, oral mucosa appears moist, neck supple. CARDIAC- RRR, no murmurs, rubs or gallops. RESP- Moving equal volumes of air, and clear to auscultation bilaterally, no wheezes or crackles. ABDOMEN- Soft, nontender, no guarding or rebound, bowel sounds present. BACK- Normal curvature of the spine, No tenderness along the vertebrae, no CVA tenderness. NEURO- No obvious Cr N abnormality, strenght upper and lower extremities- 5/5, Gait- Normal. EXTREMITIES- pulse 2+, symmetric, no pedal edema. SKIN- Warm, dry, No rash  or lesion. PSYCH- Normal mood and affect, appropriate thought content and speech.   Assessment & Plan:  The patient's case and plan of care was discussed with attending physician, Dr. N. Narendra.  Please see problem based charting for assessment and plan.      

## 2013-08-01 NOTE — Assessment & Plan Note (Signed)
Also with allergic conjunctivitis, With Sneezing, coughing, nasal congestion and itchy eyes. Previous Episode in the past.  Plan- Beclomethasone steroid spray- 2 puffs daily, max of 4 puffs. Loratidine- 10mg  daily (Avoided benadryl due to sedation). Saline nasal spray.

## 2013-08-01 NOTE — Patient Instructions (Addendum)
You are having allergic rhinitis and conjunctivitis- which is allergy involving your nose and eyes. We have prescribed an inhaler and a medication you can take by mouth. Take them in morning before you go out.  You can take a maximum of 4 sprays a day of the medication by nose. Also apply the nasal saline rinse to your nose.    Allergic Rhinitis Allergic rhinitis is when the mucous membranes in the nose respond to allergens. Allergens are particles in the air that cause your body to have an allergic reaction. This causes you to release allergic antibodies. Through a chain of events, these eventually cause you to release histamine into the blood stream. Although meant to protect the body, it is this release of histamine that causes your discomfort, such as frequent sneezing, congestion, and an itchy, runny nose.  CAUSES  Seasonal allergic rhinitis (hay fever) is caused by pollen allergens that may come from grasses, trees, and weeds. Year-round allergic rhinitis (perennial allergic rhinitis) is caused by allergens such as house dust mites, pet dander, and mold spores.  SYMPTOMS   Nasal stuffiness (congestion).  Itchy, runny nose with sneezing and tearing of the eyes. DIAGNOSIS  Your health care provider can help you determine the allergen or allergens that trigger your symptoms. If you and your health care provider are unable to determine the allergen, skin or blood testing may be used. TREATMENT  Allergic Rhinitis does not have a cure, but it can be controlled by:  Medicines and allergy shots (immunotherapy).  Avoiding the allergen. Hay fever may often be treated with antihistamines in pill or nasal spray forms. Antihistamines block the effects of histamine. There are over-the-counter medicines that may help with nasal congestion and swelling around the eyes. Check with your health care provider before taking or giving this medicine.  If avoiding the allergen or the medicine prescribed do not  work, there are many new medicines your health care provider can prescribe. Stronger medicine may be used if initial measures are ineffective. Desensitizing injections can be used if medicine and avoidance does not work. Desensitization is when a patient is given ongoing shots until the body becomes less sensitive to the allergen. Make sure you follow up with your health care provider if problems continue. HOME CARE INSTRUCTIONS It is not possible to completely avoid allergens, but you can reduce your symptoms by taking steps to limit your exposure to them. It helps to know exactly what you are allergic to so that you can avoid your specific triggers. SEEK MEDICAL CARE IF:   You have a fever.  You develop a cough that does not stop easily (persistent).  You have shortness of breath.  You start wheezing.  Symptoms interfere with normal daily activities.

## 2013-08-01 NOTE — Assessment & Plan Note (Addendum)
Blood pressure well controlled today on Quinapril- HCTZ- 10- 12.5mg  daily. Pt says he is complaint.  Plan- Bmet today. - Continue current medication. - Lipid profile today.

## 2013-08-02 NOTE — Progress Notes (Signed)
INTERNAL MEDICINE TEACHING ATTENDING ADDENDUM - Stormy Sabol, MD: I reviewed and discussed with the resident Dr. Emokpae, the patient's medical history, physical examination, diagnosis and results of pertinent tests and treatment and I agree with the patient's care as documented. 

## 2013-10-04 ENCOUNTER — Other Ambulatory Visit: Payer: Self-pay | Admitting: Internal Medicine

## 2013-10-07 NOTE — Telephone Encounter (Signed)
Called to pharm 

## 2013-11-06 ENCOUNTER — Other Ambulatory Visit: Payer: Self-pay | Admitting: Internal Medicine

## 2013-11-09 ENCOUNTER — Encounter: Payer: Self-pay | Admitting: Internal Medicine

## 2013-11-09 ENCOUNTER — Ambulatory Visit (INDEPENDENT_AMBULATORY_CARE_PROVIDER_SITE_OTHER): Payer: Medicare Other | Admitting: Internal Medicine

## 2013-11-09 VITALS — BP 122/80 | HR 79 | Temp 97.8°F | Ht 72.0 in | Wt 149.3 lb

## 2013-11-09 DIAGNOSIS — I10 Essential (primary) hypertension: Secondary | ICD-10-CM | POA: Diagnosis not present

## 2013-11-09 DIAGNOSIS — Z23 Encounter for immunization: Secondary | ICD-10-CM

## 2013-11-09 DIAGNOSIS — R51 Headache: Secondary | ICD-10-CM | POA: Diagnosis not present

## 2013-11-09 DIAGNOSIS — G8929 Other chronic pain: Secondary | ICD-10-CM | POA: Diagnosis not present

## 2013-11-09 DIAGNOSIS — J3089 Other allergic rhinitis: Secondary | ICD-10-CM | POA: Diagnosis not present

## 2013-11-09 DIAGNOSIS — E1149 Type 2 diabetes mellitus with other diabetic neurological complication: Secondary | ICD-10-CM | POA: Diagnosis not present

## 2013-11-09 DIAGNOSIS — J309 Allergic rhinitis, unspecified: Secondary | ICD-10-CM | POA: Diagnosis not present

## 2013-11-09 DIAGNOSIS — Z7184 Encounter for health counseling related to travel: Secondary | ICD-10-CM

## 2013-11-09 DIAGNOSIS — E119 Type 2 diabetes mellitus without complications: Secondary | ICD-10-CM | POA: Diagnosis not present

## 2013-11-09 DIAGNOSIS — N529 Male erectile dysfunction, unspecified: Secondary | ICD-10-CM | POA: Diagnosis not present

## 2013-11-09 DIAGNOSIS — Z0000 Encounter for general adult medical examination without abnormal findings: Secondary | ICD-10-CM | POA: Insufficient documentation

## 2013-11-09 DIAGNOSIS — K219 Gastro-esophageal reflux disease without esophagitis: Secondary | ICD-10-CM | POA: Diagnosis not present

## 2013-11-09 DIAGNOSIS — Z7189 Other specified counseling: Secondary | ICD-10-CM | POA: Diagnosis not present

## 2013-11-09 DIAGNOSIS — Z9109 Other allergy status, other than to drugs and biological substances: Secondary | ICD-10-CM | POA: Diagnosis not present

## 2013-11-09 DIAGNOSIS — IMO0002 Reserved for concepts with insufficient information to code with codable children: Secondary | ICD-10-CM

## 2013-11-09 DIAGNOSIS — J302 Other seasonal allergic rhinitis: Secondary | ICD-10-CM

## 2013-11-09 LAB — GLUCOSE, CAPILLARY: Glucose-Capillary: 96 mg/dL (ref 70–99)

## 2013-11-09 LAB — POCT GLYCOSYLATED HEMOGLOBIN (HGB A1C): Hemoglobin A1C: 6.7

## 2013-11-09 MED ORDER — GABAPENTIN 300 MG PO CAPS: ORAL_CAPSULE | ORAL | Status: DC

## 2013-11-09 MED ORDER — INSULIN GLARGINE 100 UNIT/ML SOLOSTAR PEN: PEN_INJECTOR | SUBCUTANEOUS | Status: DC

## 2013-11-09 MED ORDER — FLUTICASONE PROPIONATE 50 MCG/ACT NA SUSP: 2.0000 | Freq: Every day | NASAL | Status: DC

## 2013-11-09 MED ORDER — MEFLOQUINE HCL 250 MG PO TABS: 250.0000 mg | ORAL_TABLET | ORAL | Status: DC

## 2013-11-09 MED ORDER — PANTOPRAZOLE SODIUM 40 MG PO TBEC: 40.0000 mg | DELAYED_RELEASE_TABLET | Freq: Every day | ORAL | Status: DC

## 2013-11-09 MED ORDER — METFORMIN HCL 1000 MG PO TABS: 1000.0000 mg | ORAL_TABLET | Freq: Two times a day (BID) | ORAL | Status: DC

## 2013-11-09 MED ORDER — SALINE NASAL SPRAY 0.65 % NA SOLN: 1.0000 | NASAL | Status: DC | PRN

## 2013-11-09 MED ORDER — TRAMADOL HCL 50 MG PO TABS: ORAL_TABLET | ORAL | Status: DC

## 2013-11-09 MED ORDER — CETIRIZINE HCL 10 MG PO TABS: 10.0000 mg | ORAL_TABLET | Freq: Every day | ORAL | Status: DC

## 2013-11-09 MED ORDER — QUINAPRIL-HYDROCHLOROTHIAZIDE 10-12.5 MG PO TABS: ORAL_TABLET | ORAL | Status: DC

## 2013-11-09 MED ORDER — BUTALBITAL-APAP-CAFFEINE 50-325-40 MG PO TABS: 1.0000 | ORAL_TABLET | Freq: Four times a day (QID) | ORAL | Status: DC | PRN

## 2013-11-09 MED ORDER — INSULIN PEN NEEDLE 31G X 5 MM MISC: Status: DC

## 2013-11-09 NOTE — Patient Instructions (Addendum)
-  I have refilled your medication for 3 months  -Take larium 1 week before travel and then weekly on the same day while you are in [place] and continue for 4 weeks. Take with meals and with at least 8 oz (240 mL) of water. -Start taking zyrtec 10 mg daily for allergies instead of claritin  -Use ocean nasal spray and then flonase 2 sprays in each nostril daily for allergies -Don't take the qvar inhaler  -Start taking protonix 40 mg daily for acid reflux disease and discontinue prilosec -Will check your bloodwork today -Nice meeting you and have a safe trip!!   General Instructions:   Thank you for bringing your medicines today. This helps [place] keep you safe from mistakes.   Progress Toward Treatment Goals:  Treatment Goal 12/10/2012  Hemoglobin A1C improved  Blood pressure at goal    Self Care Goals & Plans:  Self Care Goal 06/27/2013  Manage my medications take my medicines as prescribed; bring my medications to every visit; refill my medications on time  Monitor my health -  Eat healthy foods drink diet soda or water instead of juice or soda; eat more vegetables; eat foods that are low in salt; eat baked foods instead of fried foods; eat fruit for snacks and desserts  Be physically active -  Meeting treatment goals -    Home Blood Glucose Monitoring 12/10/2012  Check my blood sugar 2 times a day  When to check my blood sugar -     Care Management & Community Referrals:  Referral 12/10/2012  Referrals made for care management support none needed

## 2013-11-09 NOTE — Progress Notes (Signed)
Patient ID: Mitchell Page, male   DOB: 1956/01/25, 58 y.o.   MRN: [id number]    Subjective:   Patient ID: Mitchell Page male   DOB: 1955-05-11 58 y.o.   MRN: [id number]  HPI: [name] is a 58 y.o. pleasant man with past medical history of hypertension, insulin-dependent Type II DM, chronic headaches, low back pain s/p laminectomy 2008, allergic rhinitis, and GERD who presents for medication refills for upcoming travel.   He reports he is leaving to go to his [place] of [place] in a few weeks and needs 50-month supplies of all of his medications.  He is compliant with taking quinapril-HCTZ daily. He has occasional headaches (chronic) but denies blurry vision, chest pain, LE edema, or lightheadedness.   His last A1c was 7 on 06/27/13. He is compliant with taking metformin 1000 mg BID and Lantus 12 U daily. He rarely checks his blood sugar and has brought in his glucose meter today which reveals 2 values in the last month with average of 136 (95 and 160). He denies symptomatic hypoglycemia, polydipsia, polyuria, polyphagia, blurry vision, neuropathy (controlled on gabapentin which he reports taking BID), or foot injury/ulcer. He follows a healthy diet but admits he does not exercise much. His weight has been stable.   He takes prilosec 20 mg daily but has symptoms of acid reflux disease at times. He denies dysphagia, odynophagia, melena, or weight loss.    He has chronic bifrontal tension-type headaches which he has not had in the past few months. He takes fiorcet as needed which helps relieve the pain. He denies photosensitivity, nausea, or vomiting with the headaches.   He has chronic low back pain status post laminectomy in 2008 and takes tramadol BID which controls his pain. He denies recent injury/fall, LE weakness or paraesthesias, or bowel/bladder incontinence.   He has seasonal allergic rhinitis and was at last visit instructed to take qvar inhaler which he  did not use. He denies history of asthma, cough, wheezing, or dyspnea. He also did not use the saline nasal spray that was prescribed at last visit.  He has been taking claritin daily but continues to have nasal congestion, sneezing, and itchy/watery/red eyes. He has never tried zyrtec in the past. His triggers are outside environmental allergens.   He denies history of malaria. He reports he was given lariam last time he went to [place] for prophylaxis and had no adverse reaction after taking it.   He is interested in receiving an annual influenza vaccination today.    Past Medical History  Diagnosis Date  . GERD (gastroesophageal reflux disease)   . Diabetes mellitus   . Lower back pain     w/ R LE sciatica s/p laminectomy in 2008. MRI- 04/2008  . Chronic headache   . Allergic rhinitis   . Hypertension   . Illiteracy and low-level literacy     Unclear of his literacy  in his native tongue, however, he is unable to read in [place]   Current Outpatient Prescriptions  Medication Sig Dispense Refill  . beclomethasone (QVAR) 80 MCG/ACT inhaler Inhale 2 puffs into the lungs daily. Maximum- 4 puffs per day.  1 Inhaler  12  . Blood Glucose Monitoring Suppl (ACCU-CHEK AVIVA PLUS) W/DEVICE KIT 1 Device by Does not apply route 3 (three) times daily. To check blood sugar 3 times daily. diag code 250.00. Insulin dependent  1 kit  0  . Blood Glucose Monitoring Suppl (WAVESENSE AMP) W/DEVICE KIT 1 meter, use  to measure blood glucose 3 times daily  1 kit  0  . butalbital-acetaminophen-caffeine (FIORICET) 50-325-40 MG per tablet Take 1-2 tablets by mouth every 6 (six) hours as needed for headache.  15 tablet  0  . desloratadine (CLARINEX REDITAB) 5 MG disintegrating tablet Take 1 tablet (5 mg total) by mouth daily.  90 tablet  0  . EQ LORATADINE 10 MG tablet TAKE ONE TABLET BY MOUTH ONCE DAILY  30 tablet  0  . gabapentin (NEURONTIN) 300 MG capsule TAKE ONE CAPSULE BY MOUTH THREE TIMES DAILY  270 capsule  1   . glucose blood (ACCU-CHEK AVIVA) test strip Use to check blood sugar twice daily, before breakfast and after lunch or dinner.  100 each  12  . glucose blood test strip To check CBG 3 times a day  100 each  5  . Insulin Glargine (LANTUS SOLOSTAR) 100 UNIT/ML SOPN Inject 12 units every evening. Diag code 250.00. Insulin dependent  1 pen  5  . Insulin Pen Needle (PRODIGY MINI PEN NEEDLES) 31G X 5 MM MISC Use as directed for once daily insulin injection.  100 each  2  . Lancet Devices (ACCU-CHEK SOFTCLIX) lancets Use to check blood sugar twice a day  1 each  5  . mefloquine (LARIAM) 250 MG tablet Take 1 tablet (250 mg total) by mouth every 7 (seven) days. Start taking medicine ONE WEEK before travel, continue FOUR WEEKS after travel  7 tablet  0  . metFORMIN (GLUCOPHAGE) 1000 MG tablet Take 1 tablet (1,000 mg total) by mouth 2 (two) times daily with a meal.  180 tablet  4  . methocarbamol (ROBAXIN) 500 MG tablet       . omeprazole (PRILOSEC) 20 MG capsule Take 1 capsule (20 mg total) by mouth daily.  30 capsule  5  . oxyCODONE-acetaminophen (PERCOCET) 10-325 MG per tablet       . quinapril-hydrochlorothiazide (ACCURETIC) 10-12.5 MG per tablet TAKE 1 TABLET BY MOUTH ONCE DAILY  30 tablet  5  . sildenafil (VIAGRA) 50 MG tablet Take 1 tablet (50 mg total) by mouth as needed for erectile dysfunction.  4 tablet  0  . sodium chloride (OCEAN) 0.65 % nasal spray Place 1 spray into the nose as needed for congestion.  30 mL  2  . traMADol (ULTRAM) 50 MG tablet TAKE 1 TABLET BY MOUTH EVERY 6 HOURS AS NEEDED FOR PAIN  120 tablet  0   No current facility-administered medications for this visit.   No family history on file. History   Social History  . Marital Status: Married    Spouse Name: N/A    Number of Children: N/A  . Years of Education: N/A   Social History Main Topics  . Smoking status: Never Smoker   . Smokeless tobacco: Not on file  . Alcohol Use: No  . Drug Use: No  . Sexual Activity: Not  on file   Other Topics Concern  . Not on file   Social History Narrative   Immigrated from [place] in 1990.   Married, has 3 children.   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   Uninsured.   Highest education - middle school.   No smoking, alcohol or illicit drug use.      Cannot read in [place].   Review of Systems: Review of Systems  Constitutional: Negative for fever, chills, weight loss and malaise/fatigue.  Decreased appetite  HENT: Positive for congestion (due to allergic rhinitis).   Eyes: Negative for blurred vision.  Respiratory: Negative for cough, shortness of breath and wheezing.   Cardiovascular: Negative for chest pain, palpitations and leg swelling.  Gastrointestinal: Positive for heartburn. Negative for nausea, vomiting, abdominal pain, diarrhea, constipation and blood in stool.  Genitourinary: Negative for dysuria, urgency and frequency.  Musculoskeletal: Positive for back pain (chronic). Negative for falls.  Neurological: Positive for sensory change (chronic peripheral neuropathy controlled on gabapentin) and headaches (chronic ). Negative for dizziness and weakness.  Endo/Heme/Allergies: Positive for environmental allergies. Negative for polydipsia.  Psychiatric/Behavioral: Negative for substance abuse.    Objective:  Physical Exam: Filed Vitals:   11/09/13 1445  BP: 122/80  Pulse: 79  Temp: 97.8 F (36.6 C)  TempSrc: Oral  Height: 6' (1.829 m)  Weight: 149 lb 4.8 oz (67.722 kg)  SpO2: 96%    Physical Exam  Constitutional: He is oriented to person, place, and time. He appears well-developed and well-nourished. No distress.  HENT:  Head: Normocephalic and atraumatic.  Right Ear: External ear normal.  Left Ear: External ear normal.  Nose: Nose normal.  Mouth/Throat: Oropharynx is clear and moist. No oropharyngeal exudate.  Sounds nasally congested.  Eyes: Conjunctivae and  EOM are normal. Pupils are equal, round, and reactive to light. Right eye exhibits no discharge. Left eye exhibits no discharge. No scleral icterus.  Neck: Normal range of motion. Neck supple.  Cardiovascular: Normal rate, regular rhythm and normal heart sounds.   Pulmonary/Chest: Effort normal and breath sounds normal. No respiratory distress. He has no wheezes. He has no rales.  Abdominal: Soft. Bowel sounds are normal. He exhibits no distension. There is no tenderness. There is no rebound and no guarding.  Musculoskeletal: Normal range of motion. He exhibits no edema and no tenderness.  Neurological: He is alert and oriented to person, place, and time.  Skin: Skin is warm and dry. No rash noted. He is not diaphoretic. No erythema. No pallor.  Psychiatric: He has a normal mood and affect. His behavior is normal. Judgment and thought content normal.    Assessment & Plan:   Please see problem list for problem-based assessment and plan

## 2013-11-09 NOTE — Assessment & Plan Note (Signed)
Assessment: Pt with chronic low back pain status post laminectomy in 2008 with well-controlled pain on tramadol therapy who presents with no recent fall/injury/trauma or alarm symptoms.   Plan:  -Refill tramadol 50 mg Q 6 hr PRN pain (180 pills with zero refills)

## 2013-11-09 NOTE — Assessment & Plan Note (Signed)
Assessment: Pt with moderately well-controlled hypertension compliant with two-class (ACEi & diuretic) anti-hypertensive therapy who presents with blood pressure of 122/80.    Plan:  -BP 122/80 at goal <140/90 -Refill quinapril-HCTZ 10-12.5 mg daily for 29-month supply -Obtain CMP

## 2013-11-09 NOTE — Assessment & Plan Note (Signed)
Assessment: Pt with chronic tension-type headaches well-controlled on fioricet who presents with no recent headaches.  Plan: -Refill fioricet 50-325-40 mg Q 6 hr PRN headache for 17-month supply (30 pills)

## 2013-11-09 NOTE — Assessment & Plan Note (Addendum)
Pt to receive annual influenza vaccination today on 11/09/13.

## 2013-11-09 NOTE — Assessment & Plan Note (Addendum)
Assessment:  Pt with last A1 of 7 on 06/27/13 compliant with insulin and oral hypoglycemic therapy with no recent symptomatic hypoglycemia who presents with CBG of 96 and improved A1c of 6.7  Plan:  -A1c 6.7 at goal <7, continue Lantus 12 U daily and metformin 1000 mg BID (refilled for 70-month supply) -BP 122/80 at goal <140/90, continue quinapril-HCTZ 10-12.5 mg daily  -LDL 61 at goal <100 not on statin therapy  -Continue gabapentin 300 mg TID (pt reports taking BID) for chronic peripheral neuropathy. Refilled for 35-month supply. -Last annual foot exam on 02/15/13 and eye exam on 01/27/13 -Consider obtaining annual urine microalbumin test at next visit to assess effectiveness of ACEi therapy -BMI 20.24 at goal <30

## 2013-11-09 NOTE — Assessment & Plan Note (Signed)
Assessment: Pt with seasonal allergic rhinoconjunctivitis compliant with anti-histamine therapy who presents with uncontrolled symptoms.   Plan: -Discontinue beclomethasone inhaler (pt has no history of asthma)   -Continue ocean nasal spray daily (before fluticasone) -Prescribe fluticasone 50 mcg 2 sprays in each nostril daily  -Prescribe cetirizine 10 mg daily and discontinue claritin 5 mg daily

## 2013-11-09 NOTE — Assessment & Plan Note (Signed)
Assessment: Pt traveling to [place] in the next few weeks. He states he is up to date on his vaccinations. He was previously on mefloquine for malaria prophylaxis which he tolerated well.   Plan: Prescribe mefloquine 250 mg weekly for malaria prophylaxis starting 1 week before travel and continuing until 4 weeks after return. Pt instructed to take medication with meals and adequate amount of water.

## 2013-11-09 NOTE — Assessment & Plan Note (Signed)
Assessment: Pt with history of H. Pylori negative PUD with moderately well-controlled acid reflux disease compliant with PPI therapy who presents with no alarm symptoms.   Plan:  -Discontinue omeprazole 20 mg daily  -Trial of protonix 40 mg daily for 69-month supply -Monitor for alarm symptoms warranting EGD

## 2013-11-10 LAB — COMPLETE METABOLIC PANEL WITH GFR
ALBUMIN: 4.6 g/dL (ref 3.5–5.2)
ALT: 17 U/L (ref 0–53)
AST: 17 U/L (ref 0–37)
Alkaline Phosphatase: 48 U/L (ref 39–117)
BUN: 8 mg/dL (ref 6–23)
CALCIUM: 9.6 mg/dL (ref 8.4–10.5)
CHLORIDE: 103 meq/L (ref 96–112)
CO2: 24 meq/L (ref 19–32)
Creat: 0.81 mg/dL (ref 0.50–1.35)
GFR, Est African American: >89 mL/min
GFR, Est Non African American: >89 mL/min
GLUCOSE: 70 mg/dL (ref 70–99)
POTASSIUM: 4 meq/L (ref 3.5–5.3)
SODIUM: 137 meq/L (ref 135–145)
TOTAL PROTEIN: 7.7 g/dL (ref 6.0–8.3)
Total Bilirubin: 0.5 mg/dL (ref 0.2–1.2)

## 2013-11-16 NOTE — Progress Notes (Signed)
Case discussed with Dr. Rabbani at time of visit. We reviewed the resident's history and exam and pertinent patient test results. I agree with the assessment, diagnosis, and plan of care documented in the resident's note. 

## 2013-11-18 ENCOUNTER — Other Ambulatory Visit: Payer: Self-pay | Admitting: Internal Medicine

## 2013-11-18 ENCOUNTER — Telehealth: Payer: Self-pay | Admitting: *Deleted

## 2013-11-18 NOTE — Telephone Encounter (Signed)
Pt called has been out of gabapentin past few days. Pt is now using [facility].  Rx from 11/09/13 called in to pharmacy. Stanton Kidney Amelio Brosky RN 11/18/13 2:10PM

## 2013-11-29 ENCOUNTER — Other Ambulatory Visit: Payer: Self-pay | Admitting: Internal Medicine

## 2013-12-02 NOTE — Telephone Encounter (Signed)
Called to pharm 

## 2014-03-03 ENCOUNTER — Other Ambulatory Visit: Payer: Self-pay | Admitting: *Deleted

## 2014-03-06 ENCOUNTER — Other Ambulatory Visit: Payer: Self-pay | Admitting: Internal Medicine

## 2014-03-06 MED ORDER — TRAMADOL HCL 50 MG PO TABS: 50.0000 mg | ORAL_TABLET | Freq: Two times a day (BID) | ORAL | Status: DC | PRN

## 2014-03-06 MED ORDER — TRAMADOL HCL 50 MG PO TABS
50.0000 mg | ORAL_TABLET | Freq: Two times a day (BID) | ORAL | Status: DC | PRN
Start: 2014-03-06 — End: 2014-03-06

## 2014-03-06 NOTE — Telephone Encounter (Signed)
Rx called in 

## 2014-03-06 NOTE — Telephone Encounter (Signed)
Rx amount has been changed to # 90 and called in

## 2014-06-19 ENCOUNTER — Other Ambulatory Visit: Payer: Self-pay | Admitting: Internal Medicine

## 2014-06-30 DIAGNOSIS — R5383 Other fatigue: Secondary | ICD-10-CM | POA: Diagnosis not present

## 2014-06-30 DIAGNOSIS — I10 Essential (primary) hypertension: Secondary | ICD-10-CM | POA: Diagnosis not present

## 2014-06-30 DIAGNOSIS — E780 Pure hypercholesterolemia: Secondary | ICD-10-CM | POA: Diagnosis not present

## 2014-06-30 DIAGNOSIS — R5382 Chronic fatigue, unspecified: Secondary | ICD-10-CM | POA: Diagnosis not present

## 2014-06-30 DIAGNOSIS — N411 Chronic prostatitis: Secondary | ICD-10-CM | POA: Diagnosis not present

## 2014-06-30 DIAGNOSIS — E119 Type 2 diabetes mellitus without complications: Secondary | ICD-10-CM | POA: Diagnosis not present

## 2014-06-30 DIAGNOSIS — N529 Male erectile dysfunction, unspecified: Secondary | ICD-10-CM | POA: Diagnosis not present

## 2014-07-06 ENCOUNTER — Telehealth: Payer: Self-pay | Admitting: Internal Medicine

## 2014-07-06 NOTE — Telephone Encounter (Signed)
Call to patient to confirm appointment for 07/07/14 at 2:45 mail box full

## 2014-07-07 ENCOUNTER — Ambulatory Visit (INDEPENDENT_AMBULATORY_CARE_PROVIDER_SITE_OTHER): Payer: Medicare Other | Admitting: Internal Medicine

## 2014-07-07 ENCOUNTER — Encounter: Payer: Self-pay | Admitting: Internal Medicine

## 2014-07-07 VITALS — BP 130/70 | HR 70 | Temp 98.0°F | Ht 72.0 in | Wt 160.4 lb

## 2014-07-07 DIAGNOSIS — E1140 Type 2 diabetes mellitus with diabetic neuropathy, unspecified: Secondary | ICD-10-CM | POA: Diagnosis not present

## 2014-07-07 DIAGNOSIS — Z794 Long term (current) use of insulin: Secondary | ICD-10-CM

## 2014-07-07 DIAGNOSIS — E1142 Type 2 diabetes mellitus with diabetic polyneuropathy: Secondary | ICD-10-CM

## 2014-07-07 DIAGNOSIS — J309 Allergic rhinitis, unspecified: Secondary | ICD-10-CM | POA: Diagnosis not present

## 2014-07-07 DIAGNOSIS — K279 Peptic ulcer, site unspecified, unspecified as acute or chronic, without hemorrhage or perforation: Secondary | ICD-10-CM

## 2014-07-07 DIAGNOSIS — E119 Type 2 diabetes mellitus without complications: Secondary | ICD-10-CM

## 2014-07-07 DIAGNOSIS — E11321 Type 2 diabetes mellitus with mild nonproliferative diabetic retinopathy with macular edema: Secondary | ICD-10-CM

## 2014-07-07 DIAGNOSIS — J302 Other seasonal allergic rhinitis: Secondary | ICD-10-CM

## 2014-07-07 DIAGNOSIS — I10 Essential (primary) hypertension: Secondary | ICD-10-CM

## 2014-07-07 DIAGNOSIS — E1165 Type 2 diabetes mellitus with hyperglycemia: Secondary | ICD-10-CM

## 2014-07-07 DIAGNOSIS — E11339 Type 2 diabetes mellitus with moderate nonproliferative diabetic retinopathy without macular edema: Secondary | ICD-10-CM | POA: Diagnosis not present

## 2014-07-07 LAB — BASIC METABOLIC PANEL WITH GFR
BUN: 12 mg/dL (ref 6–23)
CO2: 23 mEq/L (ref 19–32)
Calcium: 9.1 mg/dL (ref 8.4–10.5)
Chloride: 101 mEq/L (ref 96–112)
Creat: 0.87 mg/dL (ref 0.50–1.35)
GFR, Est African American: >89 mL/min
GFR, Est Non African American: >89 mL/min
GLUCOSE: 182 mg/dL — AB (ref 70–99)
POTASSIUM: 3.9 meq/L (ref 3.5–5.3)
Sodium: 133 mEq/L — ABNORMAL LOW (ref 135–145)

## 2014-07-07 LAB — LIPID PANEL
CHOLESTEROL: 138 mg/dL (ref 0–200)
HDL: 52 mg/dL (ref >=40)
LDL Cholesterol: 76 mg/dL (ref 0–99)
Total CHOL/HDL Ratio: 2.7 Ratio
Triglycerides: 52 mg/dL (ref <150)
VLDL: 10 mg/dL (ref 0–40)

## 2014-07-07 LAB — POCT GLYCOSYLATED HEMOGLOBIN (HGB A1C): Hemoglobin A1C: 10.8

## 2014-07-07 LAB — GLUCOSE, CAPILLARY: Glucose-Capillary: 240 mg/dL — ABNORMAL HIGH (ref 70–99)

## 2014-07-07 LAB — HM DIABETES EYE EXAM

## 2014-07-07 MED ORDER — FLUTICASONE PROPIONATE 50 MCG/ACT NA SUSP: 2.0000 | Freq: Every day | NASAL | Status: DC

## 2014-07-07 MED ORDER — OMEPRAZOLE 20 MG PO CPDR
20.0000 mg | DELAYED_RELEASE_CAPSULE | Freq: Every day | ORAL | Status: DC
Start: 2014-07-07 — End: 2014-10-11

## 2014-07-07 MED ORDER — CETIRIZINE HCL 10 MG PO TABS: 10.0000 mg | ORAL_TABLET | Freq: Every day | ORAL | Status: DC

## 2014-07-07 MED ORDER — GABAPENTIN 300 MG PO CAPS: 300.0000 mg | ORAL_CAPSULE | Freq: Three times a day (TID) | ORAL | Status: DC

## 2014-07-07 MED ORDER — INSULIN GLARGINE 100 UNIT/ML SOLOSTAR PEN: PEN_INJECTOR | SUBCUTANEOUS | Status: DC

## 2014-07-07 MED ORDER — SALINE NASAL SPRAY 0.65 % NA SOLN: 1.0000 | NASAL | Status: DC | PRN

## 2014-07-07 NOTE — Assessment & Plan Note (Addendum)
Lab Results  Component Value Date   HGBA1C 10.8 07/07/2014   HGBA1C 6.7 11/09/2013   HGBA1C 7.0 06/27/2013     Assessment: Diabetes control: poor control (HgbA1C >9%) Progress toward A1C goal:  deteriorated Comments: diet eating CHOs, out of metformin x 3 months while in [place]  Plan: Medications:  continue current medications (increase Lantus to 20 units qam), continue Metformin 1000 mg bid Home glucose monitoring: Frequency: once a day Timing: before meals Instruction/counseling given: reminded to get eye exam and discussed foot care Self management tools provided: other (see comments) Other plans: f/u in 2-4 weeks, will make nutrition referral, foot exam and eye exam, Rx refill of Neurontin

## 2014-07-07 NOTE — Progress Notes (Signed)
Internal Medicine Clinic Attending  Case discussed with Dr. McLean at the time of the visit.  We reviewed the resident's history and exam and pertinent patient test results.  I agree with the assessment, diagnosis, and plan of care documented in the resident's note. 

## 2014-07-07 NOTE — Patient Instructions (Signed)
General Instructions: Please follow up in 2-4 weeks for your diabetes Increase you insulin to 20 units. Take Metformin 1000 mg 2 x per day Log your blood glucose levels and bring the paper into next time.  Pick up your prescriptions  Hypertension Hypertension, commonly called high blood pressure, is when the force of blood pumping through your arteries is too strong. Your arteries are the blood vessels that carry blood from your heart throughout your body. A blood pressure reading consists of a higher number over a lower number, such as 110/72. The higher number (systolic) is the pressure inside your arteries when your heart pumps. The lower number (diastolic) is the pressure inside your arteries when your heart relaxes. Ideally you want your blood pressure below 120/80. Hypertension forces your heart to work harder to pump blood. Your arteries may become narrow or stiff. Having hypertension puts you at risk for heart disease, stroke, and other problems.  RISK FACTORS Some risk factors for high blood pressure are controllable. Others are not.  Risk factors you cannot control include:   Race. You may be at higher risk if you are African American.  Age. Risk increases with age.  Gender. Men are at higher risk than women before age 59 years. After age 10065, women are at higher risk than men. Risk factors you can control include:  Not getting enough exercise or physical activity.  Being overweight.  Getting too much fat, sugar, calories, or salt in your diet.  Drinking too much alcohol. SIGNS AND SYMPTOMS Hypertension does not usually cause signs or symptoms. Extremely high blood pressure (hypertensive crisis) may cause headache, anxiety, shortness of breath, and nosebleed. DIAGNOSIS  To check if you have hypertension, your health care provider will measure your blood pressure while you are seated, with your arm held at the level of your heart. It should be measured at least twice using the  same arm. Certain conditions can cause a difference in blood pressure between your right and left arms. A blood pressure reading that is higher than normal on one occasion does not mean that you need treatment. If one blood pressure reading is high, ask your health care provider about having it checked again. TREATMENT  Treating high blood pressure includes making lifestyle changes and possibly taking medicine. Living a healthy lifestyle can help lower high blood pressure. You may need to change some of your habits. Lifestyle changes may include:  Following the DASH diet. This diet is high in fruits, vegetables, and whole grains. It is low in salt, red meat, and added sugars.  Getting at least 2 hours of brisk physical activity every week.  Losing weight if necessary.  Not smoking.  Limiting alcoholic beverages.  Learning ways to reduce stress. If lifestyle changes are not enough to get your blood pressure under control, your health care provider may prescribe medicine. You may need to take more than one. Work closely with your health care provider to understand the risks and benefits. HOME CARE INSTRUCTIONS  Have your blood pressure rechecked as directed by your health care provider.   Take medicines only as directed by your health care provider. Follow the directions carefully. Blood pressure medicines must be taken as prescribed. The medicine does not work as well when you skip doses. Skipping doses also puts you at risk for problems.   Do not smoke.   Monitor your blood pressure at home as directed by your health care provider. SEEK MEDICAL CARE IF:   You think  you are having a reaction to medicines taken.  You have recurrent headaches or feel dizzy.  You have swelling in your ankles.  You have trouble with your vision. SEEK IMMEDIATE MEDICAL CARE IF:  You develop a severe headache or confusion.  You have unusual weakness, numbness, or feel faint.  You have severe  chest or abdominal pain.  You vomit repeatedly.  You have trouble breathing. MAKE SURE YOU:   Understand these instructions.  Will watch your condition.  Will get help right away if you are not doing well or get worse. Document Released: 03/03/2005 Document Revised: 07/18/2013 Document Reviewed: 12/24/2012 [facility] Patient Information 2015 [place], [place]. This information is not intended to replace advice given to you by your health care provider. Make sure you discuss any questions you have with your health care provider.  Hyperglycemia Hyperglycemia occurs when the glucose (sugar) in your blood is too high. Hyperglycemia can happen for many reasons, but it most often happens to people who do not know they have diabetes or are not managing their diabetes properly.  CAUSES  Whether you have diabetes or not, there are other causes of hyperglycemia. Hyperglycemia can occur when you have diabetes, but it can also occur in other situations that you might not be as aware of, such as: Diabetes  If you have diabetes and are having problems controlling your blood glucose, hyperglycemia could occur because of some of the following reasons:  Not following your meal plan.  Not taking your diabetes medications or not taking it properly.  Exercising less or doing less activity than you normally do.  Being sick. Pre-diabetes  This cannot be ignored. Before people develop Type 2 diabetes, they almost always have "pre-diabetes." This is when your blood glucose levels are higher than normal, but not yet high enough to be diagnosed as diabetes. Research has shown that some long-term damage to the body, especially the heart and circulatory system, may already be occurring during pre-diabetes. If you take action to manage your blood glucose when you have pre-diabetes, you may delay or prevent Type 2 diabetes from developing. Stress  If you have diabetes, you may be "diet" controlled or on oral  medications or insulin to control your diabetes. However, you may find that your blood glucose is higher than usual in the hospital whether you have diabetes or not. This is often referred to as "stress hyperglycemia." Stress can elevate your blood glucose. This happens because of hormones put out by the body during times of stress. If stress has been the cause of your high blood glucose, it can be followed regularly by your caregiver. That way he/she can make sure your hyperglycemia does not continue to get worse or progress to diabetes. Steroids  Steroids are medications that act on the infection fighting system (immune system) to block inflammation or infection. One side effect can be a rise in blood glucose. Most people can produce enough extra insulin to allow for this rise, but for those who cannot, steroids make blood glucose levels go even higher. It is not unusual for steroid treatments to "uncover" diabetes that is developing. It is not always possible to determine if the hyperglycemia will go away after the steroids are stopped. A special blood test called an A1c is sometimes done to determine if your blood glucose was elevated before the steroids were started. SYMPTOMS  Thirsty.  Frequent urination.  Dry mouth.  Blurred vision.  Tired or fatigue.  Weakness.  Sleepy.  Tingling in  feet or leg. DIAGNOSIS  Diagnosis is made by monitoring blood glucose in one or all of the following ways:  A1c test. This is a chemical found in your blood.  Fingerstick blood glucose monitoring.  Laboratory results. TREATMENT  First, knowing the cause of the hyperglycemia is important before the hyperglycemia can be treated. Treatment may include, but is not be limited to:  Education.  Change or adjustment in medications.  Change or adjustment in meal plan.  Treatment for an illness, infection, etc.  More frequent blood glucose monitoring.  Change in exercise plan.  Decreasing or  stopping steroids.  Lifestyle changes. HOME CARE INSTRUCTIONS   Test your blood glucose as directed.  Exercise regularly. Your caregiver will give you instructions about exercise. Pre-diabetes or diabetes which comes on with stress is helped by exercising.  Eat wholesome, balanced meals. Eat often and at regular, fixed times. Your caregiver or nutritionist will give you a meal plan to guide your sugar intake.  Being at an ideal weight is important. If needed, losing as little as 10 to 15 pounds may help improve blood glucose levels. SEEK MEDICAL CARE IF:   You have questions about medicine, activity, or diet.  You continue to have symptoms (problems such as increased thirst, urination, or weight gain). SEEK IMMEDIATE MEDICAL CARE IF:   You are vomiting or have diarrhea.  Your breath smells fruity.  You are breathing faster or slower.  You are very sleepy or incoherent.  You have numbness, tingling, or pain in your feet or hands.  You have chest pain.  Your symptoms get worse even though you have been following your caregiver's orders.  If you have any other questions or concerns. Document Released: 08/27/2000 Document Revised: 05/26/2011 Document Reviewed: 06/30/2011 [facility] Patient Information 2015 [place], [place]. This information is not intended to replace advice given to you by your health care provider. Make sure you discuss any questions you have with your health care provider.   Treatment Goals:  Goals (1 Years of Data) as of 07/07/14          As of Today As of Today 11/09/13 08/01/13 06/27/13     Blood Pressure   . Blood Pressure < 140/90  130/70 151/74 122/80 144/81 140/79     Result Component   . HEMOGLOBIN A1C < 7.0  10.8  6.7  7.0   . LDL CALC < 100     61       Progress Toward Treatment Goals:  Treatment Goal 07/07/2014  Hemoglobin A1C deteriorated  Blood pressure at goal    Self Care Goals & Plans:  Self Care Goal 07/07/2014  Manage my  medications take my medicines as prescribed; bring my medications to every visit; refill my medications on time; follow the sick day instructions if I am sick  Monitor my health keep track of my blood pressure; bring my glucose meter and log to each visit; keep track of my blood glucose; check my feet daily; bring my blood pressure log to each visit; keep track of my weight  Eat healthy foods drink diet soda or water instead of juice or soda; eat more vegetables; eat foods that are low in salt; eat baked foods instead of fried foods; eat fruit for snacks and desserts; eat smaller portions  Be physically active find an activity I enjoy  Meeting treatment goals maintain the current self-care plan    Home Blood Glucose Monitoring 07/07/2014  Check my blood sugar once a day  When to check my blood sugar before meals     Care Management & Community Referrals:  Referral 07/07/2014  Referrals made for care management support nutritionist  Referrals made to community resources none

## 2014-07-07 NOTE — Assessment & Plan Note (Signed)
BP Readings from Last 3 Encounters:  07/07/14 130/70  11/09/13 122/80  08/01/13 144/81    Lab Results  Component Value Date   NA 137 11/09/2013   K 4.0 11/09/2013   CREATININE 0.81 11/09/2013    Assessment: Blood pressure control: controlled Progress toward BP goal:  at goal Comments: none  Plan: Medications:  continue current medications Accuretic 10-12.5 mg qd  Self management tools provided: other (see comments) Other plans: BMET, lipid checked today, f/u in 2-4 weeks

## 2014-07-07 NOTE — Assessment & Plan Note (Signed)
Rx refill of Prilosec 20 mg

## 2014-07-07 NOTE — Addendum Note (Signed)
Addended by: Annett GulaMCLEAN, Orean Giarratano N on: 07/07/2014 04:35 PM   Modules accepted: Orders, Level of Service

## 2014-07-07 NOTE — Progress Notes (Signed)
   Subjective:    Patient ID: Mitchell Page, male    DOB: Jul 20, 1955, 59 y.o.   MRN: [id number]  HPI Comments: 59 y.o with DM 2 with neuropathy, PUD, HTN, chronic h/a, GERD, ED, chronic back pain, allergic rhinitis   He presents for f/u. He is from [place]. He reports sneezing, watery eyes and would like medication refill of allergy medication 2. DM 2-HA11C wsa 6.7  10/2013 now 10.8 with cbg 240 today. He is taking between 10-16 units of Lantus, Metformin 1000 mg bid but states he was out of his Metformin x 3 months when he went to [place]. He brings in his meter but is unable to download meter today b/c we dont have the software. He checks his glucose 1 x per day in the am.  3. H/o HTN-BP 151/74 with repeat 130/70. On Quinapril-HCTZ 10-12.5   HM-did foot exam today, checked A1C, lipid, urine Alb/Cr today.  Retinal exam done today  SH-disabled due to chronic back pain s/p surgery and lack of function in left hand     Review of Systems  HENT: Positive for sneezing.   Respiratory: Negative for shortness of breath.   Cardiovascular: Negative for chest pain.  Gastrointestinal: Negative for abdominal pain and blood in stool.  Genitourinary: Negative for hematuria.       Objective:   Physical Exam  Constitutional: He is oriented to person, place, and time. Vital signs are normal. He appears well-developed and well-nourished. He is cooperative.  HENT:  Head: Normocephalic and atraumatic.  Eyes: Conjunctivae are normal. Right eye exhibits no discharge. Left eye exhibits no discharge. No scleral icterus.  Cardiovascular: Normal rate, regular rhythm, S1 normal, S2 normal and normal heart sounds.   No murmur heard. No edema  Pulmonary/Chest: Effort normal and breath sounds normal.  Abdominal: Soft. Bowel sounds are normal. He exhibits no distension. There is no tenderness.  Musculoskeletal: He exhibits no edema.  Neurological: He is alert and oriented to person, place, and time.  Gait normal.  Skin: Skin is warm, dry and intact. No rash noted.  Psychiatric: He has a normal mood and affect. His speech is normal and behavior is normal. Judgment and thought content normal. Cognition and memory are normal.  Nursing note and vitals reviewed.         Assessment & Plan:

## 2014-07-07 NOTE — Assessment & Plan Note (Signed)
rx refill of Flonase, Zyrtec, Nasal saline

## 2014-07-10 ENCOUNTER — Encounter: Payer: Self-pay | Admitting: *Deleted

## 2014-07-11 ENCOUNTER — Encounter: Payer: Self-pay | Admitting: Internal Medicine

## 2014-07-11 ENCOUNTER — Other Ambulatory Visit: Payer: Self-pay | Admitting: Internal Medicine

## 2014-07-12 ENCOUNTER — Other Ambulatory Visit: Payer: Self-pay | Admitting: Internal Medicine

## 2014-07-12 DIAGNOSIS — E1139 Type 2 diabetes mellitus with other diabetic ophthalmic complication: Secondary | ICD-10-CM

## 2014-07-18 ENCOUNTER — Other Ambulatory Visit: Payer: Self-pay | Admitting: Internal Medicine

## 2014-07-18 DIAGNOSIS — E113299 Type 2 diabetes mellitus with mild nonproliferative diabetic retinopathy without macular edema, unspecified eye: Secondary | ICD-10-CM

## 2014-07-18 NOTE — Addendum Note (Signed)
Addended by: Neomia DearPOWERS, Aadhira Heffernan E on: 07/18/2014 02:02 PM   Modules accepted: Orders

## 2014-07-28 ENCOUNTER — Encounter: Payer: Self-pay | Admitting: Pulmonary Disease

## 2014-07-28 ENCOUNTER — Ambulatory Visit (INDEPENDENT_AMBULATORY_CARE_PROVIDER_SITE_OTHER): Payer: Medicare Other | Admitting: Pulmonary Disease

## 2014-07-28 VITALS — BP 120/61 | HR 77 | Temp 98.4°F | Ht 72.0 in | Wt 161.2 lb

## 2014-07-28 DIAGNOSIS — Z794 Long term (current) use of insulin: Secondary | ICD-10-CM

## 2014-07-28 DIAGNOSIS — R51 Headache: Secondary | ICD-10-CM | POA: Diagnosis not present

## 2014-07-28 DIAGNOSIS — R519 Headache, unspecified: Secondary | ICD-10-CM

## 2014-07-28 DIAGNOSIS — M549 Dorsalgia, unspecified: Secondary | ICD-10-CM

## 2014-07-28 DIAGNOSIS — E1165 Type 2 diabetes mellitus with hyperglycemia: Secondary | ICD-10-CM

## 2014-07-28 DIAGNOSIS — I10 Essential (primary) hypertension: Secondary | ICD-10-CM | POA: Diagnosis not present

## 2014-07-28 DIAGNOSIS — E1139 Type 2 diabetes mellitus with other diabetic ophthalmic complication: Secondary | ICD-10-CM

## 2014-07-28 MED ORDER — METFORMIN HCL 1000 MG PO TABS: ORAL_TABLET | ORAL | Status: DC

## 2014-07-28 MED ORDER — BUTALBITAL-APAP-CAFFEINE 50-325-40 MG PO TABS: 1.0000 | ORAL_TABLET | Freq: Four times a day (QID) | ORAL | Status: DC | PRN

## 2014-07-28 MED ORDER — GABAPENTIN 300 MG PO CAPS: 300.0000 mg | ORAL_CAPSULE | Freq: Three times a day (TID) | ORAL | Status: DC

## 2014-07-28 NOTE — Patient Instructions (Signed)
General Instructions:   Thank you for bringing your medicines today. This helps us keep you safe from mistakes.   Progress Toward Treatment Goals:  Treatment Goal 07/28/2014  Hemoglobin A1C improved  Blood pressure at goal    Self Care Goals & Plans:  Self Care Goal 07/07/2014  Manage my medications take my medicines as prescribed; bring my medications to every visit; refill my medications on time; follow the sick day instructions if I am sick  Monitor my health keep track of my blood pressure; bring my glucose meter and log to each visit; keep track of my blood glucose; check my feet daily; bring my blood pressure log to each visit; keep track of my weight  Eat healthy foods drink diet soda or water instead of juice or soda; eat more vegetables; eat foods that are low in salt; eat baked foods instead of fried foods; eat fruit for snacks and desserts; eat smaller portions  Be physically active find an activity I enjoy  Meeting treatment goals maintain the current self-care plan    Home Blood Glucose Monitoring 07/07/2014  Check my blood sugar once a day  When to check my blood sugar before meals

## 2014-07-28 NOTE — Progress Notes (Signed)
Subjective:   Patient ID: Mitchell Page, male    DOB: Mar 28, 1955, 59 y.o.   MRN: [id number]  HPI Mitchell Page is a 59 year old man with history of GERD, DM, HTN, lower back pain, allergic rhintis presenting for follow up.  He was last seen in clinic 07/07/2014. He had been out of metformin for 3 months while he was in [place]. He was restarted on Metformin 1084m BID and increased Lantus to 20u QAM.  Today he reports his blood glucose measurements have all been less than 200. Lowest was 97 and he denies hypoglycemic symptoms.  Review of Systems Constitutional: no fevers/chills Eyes: no vision changes Ears, nose, mouth, throat, and face: no cough Respiratory: no shortness of breath Cardiovascular: no chest pain Gastrointestinal: no nausea/vomiting, no abdominal pain, no constipation, no diarrhea Genitourinary: no dysuria, no hematuria Integument: no rash Hematologic/lymphatic: no bleeding/bruising, no edema Musculoskeletal: no arthralgias, no myalgias Neurological: no paresthesias, no weakness  Past Medical History  Diagnosis Date  . GERD (gastroesophageal reflux disease)   . Diabetes mellitus   . Lower back pain     w/ R LE sciatica s/p laminectomy in 2008. MRI- 04/2008  . Chronic headache   . Allergic rhinitis   . Hypertension   . Illiteracy and low-level literacy     Unclear of his literacy  in his native tongue, however, [place] to read in [place]   Current Outpatient Prescriptions on File Prior to Visit  Medication Sig Dispense Refill  . Blood Glucose Monitoring Suppl (ACCU-CHEK AVIVA PLUS) W/DEVICE KIT 1 Device by Does not apply route 3 (three) times daily. To check blood sugar 3 times daily. diag code 250.00. Insulin dependent 1 kit 0  . Blood Glucose Monitoring Suppl (WAVESENSE AMP) W/DEVICE KIT 1 meter, use to measure blood glucose 3 times daily 1 kit 0  . butalbital-acetaminophen-caffeine (FIORICET) 50-325-40 MG per tablet Take 1-2 tablets by mouth  every 6 (six) hours as needed for headache. 30 tablet 0  . cetirizine (ZYRTEC) 10 MG tablet Take 1 tablet (10 mg total) by mouth daily. (Patient not taking: Reported on 07/07/2014) 90 tablet 1  . EQ LORATADINE 10 MG tablet TAKE ONE TABLET BY MOUTH ONCE DAILY (Patient not taking: Reported on 07/07/2014) 30 tablet 0  . fluticasone (FLONASE) 50 MCG/ACT nasal spray Place 2 sprays into both nostrils daily. (Patient not taking: Reported on 07/07/2014) 16 g 2  . gabapentin (NEURONTIN) 300 MG capsule Take 1 capsule (300 mg total) by mouth 3 (three) times daily. 270 capsule 0  . glucose blood (ACCU-CHEK AVIVA) test strip Use to check blood sugar twice daily, before breakfast and after lunch or dinner. 100 each 12  . glucose blood test strip To check CBG 3 times a day 100 each 5  . Insulin Glargine (LANTUS SOLOSTAR) 100 UNIT/ML Solostar Pen Inject 20 nits every evening. Diag code E11.49. Insulin dependent 20 pen 5  . Insulin Pen Needle (PRODIGY MINI PEN NEEDLES) 31G X 5 MM MISC Use as directed for once daily insulin injection. 300 each 2  . Lancet Devices (ACCU-CHEK SOFTCLIX) lancets Use to check blood sugar twice a day 1 each 5  . mefloquine (LARIAM) 250 MG tablet Take 1 tablet (250 mg total) by mouth every 7 (seven) days. Start taking medicine ONE WEEK before travel, continue FOUR WEEKS after travel (Patient not taking: Reported on 07/07/2014) 20 tablet 0  . metFORMIN (GLUCOPHAGE) 1000 MG tablet TAKE 1 TABLET BY MOUTH TWICE DAILY WITH A MEAL  180 tablet 1  . omeprazole (PRILOSEC) 20 MG capsule Take 1 capsule (20 mg total) by mouth daily. 90 capsule 0  . quinapril-hydrochlorothiazide (ACCURETIC) 10-12.5 MG per tablet TAKE 1 TABLET BY MOUTH ONCE DAILY 90 tablet 3  . sildenafil (VIAGRA) 50 MG tablet Take 1 tablet (50 mg total) by mouth as needed for erectile dysfunction. 4 tablet 0  . sodium chloride (OCEAN) 0.65 % nasal spray Place 1 spray into the nose as needed for congestion. 30 mL 2  . traMADol (ULTRAM) 50 MG  tablet Take 1 tablet (50 mg total) by mouth every 12 (twelve) hours as needed. for pain (Patient not taking: Reported on 07/07/2014) 90 tablet 0   No current facility-administered medications on file prior to visit.    Today's Vitals   07/28/14 1626  BP: 120/61  Pulse: 77  Temp: 98.4 F (36.9 C)  TempSrc: Oral  Height: 6' (1.829 m)  Weight: 161 lb 3.2 oz (73.12 kg)  SpO2: 100%  PainSc: 0-No pain   Objective:  Physical Exam  Constitutional: He is oriented to person, place, and time. He appears well-developed and well-nourished.  HENT:  Head: Normocephalic.  Eyes: Conjunctivae are normal.  Neck: Neck supple.  Cardiovascular: Normal rate.   Pulmonary/Chest: Effort normal.  Abdominal: Soft.  Musculoskeletal: Normal range of motion. He exhibits no tenderness.  Neurological: He is alert and oriented to person, place, and time.  Skin: Skin is warm and dry.  Psychiatric: He has a normal mood and affect.    Assessment & Plan:  Please refer to problem based charting.

## 2014-07-30 DIAGNOSIS — R519 Headache, unspecified: Secondary | ICD-10-CM | POA: Insufficient documentation

## 2014-07-30 DIAGNOSIS — R51 Headache: Secondary | ICD-10-CM

## 2014-07-30 NOTE — Assessment & Plan Note (Signed)
Pt expressed concern about taking Tramadol long term. He says he takes 1 tablet a day even if he is not experiencing pain as a preventative measure.  Plan: -Encouraged patient to taper off of Tramadol -May take ibuprofen OTC prn -May need reevaluation by neurosurg if he has significant pain

## 2014-07-30 NOTE — Assessment & Plan Note (Signed)
BP Readings from Last 3 Encounters:  07/28/14 120/61  07/07/14 130/70  11/09/13 122/80    Lab Results  Component Value Date   NA 133* 07/07/2014   K 3.9 07/07/2014   CREATININE 0.87 07/07/2014    Assessment: Blood pressure control: controlled Progress toward BP goal:  at goal  Plan: Medications:  Continue current medications: quinapril-HCTZ 10-12.5mg 

## 2014-07-30 NOTE — Assessment & Plan Note (Signed)
Refilled Rx for Fioricet

## 2014-07-30 NOTE — Assessment & Plan Note (Signed)
Lab Results  Component Value Date   HGBA1C 10.8 07/07/2014   HGBA1C 6.7 11/09/2013   HGBA1C 7.0 06/27/2013     Assessment: Diabetes control: poor control (HgbA1C >9%) Progress toward A1C goal:  improved Comments: Blood glucose measurements improved.  Plan: Medications:  continue current medications: Metformin 1000mg  BID and Lantus 20u QHS Other plans:  -Follow up in 2.5 months

## 2014-08-01 NOTE — Progress Notes (Signed)
Internal Medicine Clinic Attending  Case discussed with Dr. Krall at the time of the visit.  We reviewed the resident's history and exam and pertinent patient test results.  I agree with the assessment, diagnosis, and plan of care documented in the resident's note.  

## 2014-08-04 ENCOUNTER — Encounter: Payer: Medicare Other | Admitting: Dietician

## 2014-08-04 DIAGNOSIS — H3562 Retinal hemorrhage, left eye: Secondary | ICD-10-CM | POA: Diagnosis not present

## 2014-08-04 DIAGNOSIS — E11331 Type 2 diabetes mellitus with moderate nonproliferative diabetic retinopathy with macular edema: Secondary | ICD-10-CM | POA: Diagnosis not present

## 2014-08-04 DIAGNOSIS — H2513 Age-related nuclear cataract, bilateral: Secondary | ICD-10-CM | POA: Diagnosis not present

## 2014-08-04 NOTE — Addendum Note (Signed)
Addended by: Neomia DearPOWERS, Auset Fritzler E on: 08/04/2014 06:40 PM   Modules accepted: Orders

## 2014-08-21 NOTE — Addendum Note (Signed)
Addended by: Neomia DearPOWERS, Lucciano Vitali E on: 08/21/2014 06:36 PM   Modules accepted: Orders

## 2014-09-12 ENCOUNTER — Other Ambulatory Visit: Payer: Self-pay | Admitting: Internal Medicine

## 2014-09-15 NOTE — Telephone Encounter (Signed)
Tramadol rx called to [facility]. 

## 2014-09-29 DIAGNOSIS — H18411 Arcus senilis, right eye: Secondary | ICD-10-CM | POA: Diagnosis not present

## 2014-09-29 DIAGNOSIS — H25011 Cortical age-related cataract, right eye: Secondary | ICD-10-CM | POA: Diagnosis not present

## 2014-09-29 DIAGNOSIS — H2511 Age-related nuclear cataract, right eye: Secondary | ICD-10-CM | POA: Diagnosis not present

## 2014-09-29 DIAGNOSIS — H2512 Age-related nuclear cataract, left eye: Secondary | ICD-10-CM | POA: Diagnosis not present

## 2014-10-02 DIAGNOSIS — R05 Cough: Secondary | ICD-10-CM | POA: Diagnosis not present

## 2014-10-02 DIAGNOSIS — I10 Essential (primary) hypertension: Secondary | ICD-10-CM | POA: Diagnosis not present

## 2014-10-02 DIAGNOSIS — E119 Type 2 diabetes mellitus without complications: Secondary | ICD-10-CM | POA: Diagnosis not present

## 2014-10-02 DIAGNOSIS — J209 Acute bronchitis, unspecified: Secondary | ICD-10-CM | POA: Diagnosis not present

## 2014-10-11 ENCOUNTER — Other Ambulatory Visit: Payer: Self-pay | Admitting: Internal Medicine

## 2014-10-26 DIAGNOSIS — H25811 Combined forms of age-related cataract, right eye: Secondary | ICD-10-CM | POA: Diagnosis not present

## 2014-10-26 DIAGNOSIS — H2511 Age-related nuclear cataract, right eye: Secondary | ICD-10-CM | POA: Diagnosis not present

## 2014-10-27 DIAGNOSIS — H2512 Age-related nuclear cataract, left eye: Secondary | ICD-10-CM | POA: Diagnosis not present

## 2014-11-03 ENCOUNTER — Ambulatory Visit (INDEPENDENT_AMBULATORY_CARE_PROVIDER_SITE_OTHER): Payer: Medicare Other | Admitting: Internal Medicine

## 2014-11-03 ENCOUNTER — Encounter: Payer: Self-pay | Admitting: Internal Medicine

## 2014-11-03 VITALS — BP 122/82 | HR 77 | Temp 98.3°F | Ht 72.0 in | Wt 162.5 lb

## 2014-11-03 DIAGNOSIS — E1165 Type 2 diabetes mellitus with hyperglycemia: Secondary | ICD-10-CM | POA: Diagnosis not present

## 2014-11-03 DIAGNOSIS — R200 Anesthesia of skin: Secondary | ICD-10-CM | POA: Diagnosis not present

## 2014-11-03 DIAGNOSIS — M25511 Pain in right shoulder: Secondary | ICD-10-CM | POA: Diagnosis not present

## 2014-11-03 DIAGNOSIS — Z794 Long term (current) use of insulin: Secondary | ICD-10-CM

## 2014-11-03 DIAGNOSIS — R5383 Other fatigue: Secondary | ICD-10-CM | POA: Diagnosis not present

## 2014-11-03 DIAGNOSIS — E1139 Type 2 diabetes mellitus with other diabetic ophthalmic complication: Secondary | ICD-10-CM | POA: Diagnosis present

## 2014-11-03 DIAGNOSIS — I10 Essential (primary) hypertension: Secondary | ICD-10-CM | POA: Diagnosis present

## 2014-11-03 DIAGNOSIS — R202 Paresthesia of skin: Secondary | ICD-10-CM

## 2014-11-03 DIAGNOSIS — R51 Headache: Secondary | ICD-10-CM | POA: Diagnosis not present

## 2014-11-03 DIAGNOSIS — R519 Headache, unspecified: Secondary | ICD-10-CM

## 2014-11-03 DIAGNOSIS — IMO0002 Reserved for concepts with insufficient information to code with codable children: Secondary | ICD-10-CM

## 2014-11-03 LAB — CBC
HCT: 37.4 % — ABNORMAL LOW (ref 39.0–52.0)
HEMOGLOBIN: 13.8 g/dL (ref 13.0–17.0)
MCH: 28.6 pg (ref 26.0–34.0)
MCHC: 36.9 g/dL — ABNORMAL HIGH (ref 30.0–36.0)
MCV: 77.6 fL — AB (ref 78.0–100.0)
PLATELETS: 164 10*3/uL (ref 150–400)
RBC: 4.82 MIL/uL (ref 4.22–5.81)
RDW: 14.1 % (ref 11.5–15.5)
WBC: 4.6 10*3/uL (ref 4.0–10.5)

## 2014-11-03 LAB — BASIC METABOLIC PANEL
Anion gap: 8 (ref 5–15)
BUN: 10 mg/dL (ref 6–20)
CO2: 28 mmol/L (ref 22–32)
CREATININE: 0.87 mg/dL (ref 0.61–1.24)
Calcium: 9.4 mg/dL (ref 8.9–10.3)
Chloride: 102 mmol/L (ref 101–111)
GFR calc non Af Amer: >60 mL/min (ref >60)
GLUCOSE: 75 mg/dL (ref 65–99)
Potassium: 3.6 mmol/L (ref 3.5–5.1)
Sodium: 138 mmol/L (ref 135–145)

## 2014-11-03 LAB — GLUCOSE, CAPILLARY: GLUCOSE-CAPILLARY: 73 mg/dL (ref 65–99)

## 2014-11-03 LAB — POCT GLYCOSYLATED HEMOGLOBIN (HGB A1C): Hemoglobin A1C: 7.5

## 2014-11-03 MED ORDER — BUTALBITAL-APAP-CAFFEINE 50-325-40 MG PO TABS: 1.0000 | ORAL_TABLET | Freq: Four times a day (QID) | ORAL | Status: DC | PRN

## 2014-11-03 MED ORDER — TRAMADOL HCL 50 MG PO TABS: 50.0000 mg | ORAL_TABLET | Freq: Four times a day (QID) | ORAL | Status: DC | PRN

## 2014-11-03 NOTE — Assessment & Plan Note (Signed)
BP Readings from Last 3 Encounters:  11/03/14 122/82  07/28/14 120/61  07/07/14 130/70    Lab Results  Component Value Date   NA 133* 07/07/2014   K 3.9 07/07/2014   CREATININE 0.87 07/07/2014    Assessment: Blood pressure control: controlled Progress toward BP goal:  at goal Comments: BP well controlled.  Plan: Medications:  Continue Quinapril-HCTZ 10-12.5 mg daily

## 2014-11-03 NOTE — Assessment & Plan Note (Signed)
Patient with very vague complaints today of generalized weakness and "not feeling well." There is nothing focal on exam that I can find. Will check CBC and bmet to see if he has an electrolyte abnormality or possible anemia that could explain his vague symptoms. He was instructed to return to clinic he continues to feel unwell.

## 2014-11-03 NOTE — Assessment & Plan Note (Signed)
Patient with 2-3 day history of right shoulder pain. He denies any trauma. He thinks he may have pulled a muscle. He notes he can reproduce the pain by moving his shoulder a certain way or palpating it.  - Refilled his tramadol - Recommended to try ibuprofen as this will target inflammation

## 2014-11-03 NOTE — Progress Notes (Signed)
   Subjective:    Patient ID: Mitchell Page, male    DOB: Jul 28, 1955, 59 y.o.   MRN: [id number]  HPI Mitchell Page is a 59yo man with PMHx of HTN, type 2 DM with retinopathy, GERD, and chronic back pain who presents today for an acute visit.   Patient states he "just doesn't feel well." He has a hard time characterizing his symptoms. He states he has generalized weakness and a headache. He notes for the past 2-3 days he has had right shoulder pain that he can reproduce by moving his arm a certain way or palpating it. He also notes a burning sensation in the fingers of his right hand. He states "they are on fire." This has been ongoing for the last month. He denies fevers, chills, chest pain, dyspnea, abdominal pain, nausea, vomiting, diarrhea, and dysuria.    Review of Systems General: Denies fever, chills, night sweats, changes in weight, changes in appetite HEENT: Denies ear pain, changes in vision, rhinorrhea, sore throat CV: Denies CP, palpitations, SOB, orthopnea Pulm: Denies SOB, cough, wheezing GI: Denies abdominal pain, nausea, vomiting, diarrhea, constipation, melena, hematochezia GU: Denies dysuria, hematuria, frequency Msk: Denies muscle cramps, joint pains Neuro: Denies weakness. Reports numbness, tingling- hands/feet Skin: Denies rashes, bruising    Objective:   Physical Exam General: sitting up in chair, NAD HEENT: [place]/AT, EOMI, sclera anicteric, pharynx non-erythematous, mucus membranes moist CV: RRR, no m/g/r Pulm: CTA bilaterally, breaths non-labored Abd: BS+, soft, non-tender, non-distended Ext: warm, no edema Neuro: alert and oriented x 3, strength intact    Assessment & Plan:  Please refer to A&P documentation.

## 2014-11-03 NOTE — Progress Notes (Signed)
Medicine attending: Medical history, presenting problems, physical findings, and medications, reviewed with Dr Carly Rivet and I concur with her evaluation and management plan. 

## 2014-11-03 NOTE — Assessment & Plan Note (Signed)
Patient complaining of burning sensation in his right hand only. Unusual that it is unilateral. Would think more diabetes related if bilateral. He is already on Gabapentin. Instructed to conservatively manage for now and continue gabapentin.

## 2014-11-03 NOTE — Assessment & Plan Note (Signed)
Patient complains of headache today. He ran out of his Fioricet. This was refilled for him as this normally controls his headaches.

## 2014-11-03 NOTE — Assessment & Plan Note (Signed)
Lab Results  Component Value Date   HGBA1C 10.8 07/07/2014   HGBA1C 6.7 11/09/2013   HGBA1C 7.0 06/27/2013     Assessment: Diabetes control: poor control (HgbA1C >9%) Comments: Patient reports blood sugars are usually in 100s-110s before meals and in 140s-150s two hours after meals. He did not bring his meter. He reports he is taking his medications.  Plan: Medications:  Continue Lantus 20 units QHS and Metformin 1000 mg BID as can not accurately evaluate blood sugars without meter Home glucose monitoring: Frequency: once a day Timing: before meals Instruction/counseling given: reminded to bring blood glucose meter & log to each visit and reminded to bring medications to each visit Other plans:  - Check HbA1c today

## 2014-11-03 NOTE — Patient Instructions (Signed)
Checking blood work as I did not find anything specific on exam I will call you if there is a problem with your blood work  General Instructions:   Thank you for bringing your medicines today. This helps [place] keep you safe from mistakes.   Progress Toward Treatment Goals:  Treatment Goal 11/03/2014  Hemoglobin A1C -  Blood pressure at goal    Self Care Goals & Plans:  Self Care Goal 11/03/2014  Manage my medications take my medicines as prescribed; bring my medications to every visit; refill my medications on time  Monitor my health keep track of my blood glucose; bring my glucose meter and log to each visit; keep track of my blood pressure; check my feet daily  Eat healthy foods drink diet soda or water instead of juice or soda; eat more vegetables; eat foods that are low in salt; eat baked foods instead of fried foods  Be physically active find an activity I enjoy  Meeting treatment goals -    Home Blood Glucose Monitoring 11/03/2014  Check my blood sugar once a day  When to check my blood sugar before meals     Care Management & Community Referrals:  Referral 07/07/2014  Referrals made for care management support nutritionist  Referrals made to community resources none

## 2014-11-16 DIAGNOSIS — H2512 Age-related nuclear cataract, left eye: Secondary | ICD-10-CM | POA: Diagnosis not present

## 2014-11-16 DIAGNOSIS — H25812 Combined forms of age-related cataract, left eye: Secondary | ICD-10-CM | POA: Diagnosis not present

## 2014-11-30 ENCOUNTER — Other Ambulatory Visit: Payer: Self-pay | Admitting: Internal Medicine

## 2014-11-30 NOTE — Telephone Encounter (Signed)
Called to [place],

## 2014-12-22 DIAGNOSIS — R5382 Chronic fatigue, unspecified: Secondary | ICD-10-CM | POA: Diagnosis not present

## 2014-12-22 DIAGNOSIS — G894 Chronic pain syndrome: Secondary | ICD-10-CM | POA: Diagnosis not present

## 2014-12-22 DIAGNOSIS — K219 Gastro-esophageal reflux disease without esophagitis: Secondary | ICD-10-CM | POA: Diagnosis not present

## 2014-12-22 DIAGNOSIS — E119 Type 2 diabetes mellitus without complications: Secondary | ICD-10-CM | POA: Diagnosis not present

## 2014-12-26 ENCOUNTER — Encounter: Payer: Self-pay | Admitting: Internal Medicine

## 2014-12-26 ENCOUNTER — Ambulatory Visit (INDEPENDENT_AMBULATORY_CARE_PROVIDER_SITE_OTHER): Payer: Medicare Other | Admitting: Internal Medicine

## 2014-12-26 VITALS — BP 153/92 | HR 73 | Temp 98.4°F | Ht 72.0 in | Wt 162.3 lb

## 2014-12-26 DIAGNOSIS — Z0000 Encounter for general adult medical examination without abnormal findings: Secondary | ICD-10-CM

## 2014-12-26 DIAGNOSIS — I10 Essential (primary) hypertension: Secondary | ICD-10-CM | POA: Diagnosis not present

## 2014-12-26 DIAGNOSIS — K219 Gastro-esophageal reflux disease without esophagitis: Secondary | ICD-10-CM

## 2014-12-26 DIAGNOSIS — Z23 Encounter for immunization: Secondary | ICD-10-CM

## 2014-12-26 DIAGNOSIS — E118 Type 2 diabetes mellitus with unspecified complications: Secondary | ICD-10-CM | POA: Diagnosis present

## 2014-12-26 DIAGNOSIS — IMO0002 Reserved for concepts with insufficient information to code with codable children: Secondary | ICD-10-CM

## 2014-12-26 DIAGNOSIS — Z794 Long term (current) use of insulin: Secondary | ICD-10-CM | POA: Diagnosis not present

## 2014-12-26 DIAGNOSIS — E1165 Type 2 diabetes mellitus with hyperglycemia: Secondary | ICD-10-CM | POA: Diagnosis not present

## 2014-12-26 LAB — GLUCOSE, CAPILLARY: Glucose-Capillary: 82 mg/dL (ref 65–99)

## 2014-12-26 MED ORDER — INSULIN PEN NEEDLE 31G X 5 MM MISC: Status: DC

## 2014-12-26 MED ORDER — GABAPENTIN 300 MG PO CAPS
300.0000 mg | ORAL_CAPSULE | Freq: Three times a day (TID) | ORAL | Status: DC
Start: 2014-12-26 — End: 2015-06-26

## 2014-12-26 MED ORDER — QUINAPRIL-HYDROCHLOROTHIAZIDE 10-12.5 MG PO TABS: ORAL_TABLET | ORAL | Status: DC

## 2014-12-26 MED ORDER — METFORMIN HCL 1000 MG PO TABS: ORAL_TABLET | ORAL | Status: DC

## 2014-12-26 MED ORDER — INSULIN GLARGINE 100 UNIT/ML SOLOSTAR PEN: PEN_INJECTOR | SUBCUTANEOUS | Status: DC

## 2014-12-26 MED ORDER — OMEPRAZOLE 20 MG PO CPDR: DELAYED_RELEASE_CAPSULE | ORAL | Status: DC

## 2014-12-26 NOTE — Patient Instructions (Signed)
1. Please schedule a follow up appointment for when you return from [place].   2. Please take all medications as previously prescribed. DO NOT MISS TAKING YOUR LANTUS OR METFORMIN.   3. If you have worsening of your symptoms or new symptoms arise, please call the clinic ([phone]), or go to the ER immediately if symptoms are severe.  You have done a great job in taking all your medications. Please continue to do this.

## 2014-12-27 NOTE — Assessment & Plan Note (Signed)
BP Readings from Last 3 Encounters:  12/26/14 153/92  11/03/14 122/82  07/28/14 120/61    Lab Results  Component Value Date   NA 138 11/03/2014   K 3.6 11/03/2014   CREATININE 0.87 11/03/2014    Assessment: Blood pressure control:  Moderately elevated. Comments: This is an isolated BP elevation, most likely related to patient not having taken his BP medication prior to his visit, as well as his stressors at home involving his ill father.   Plan: Medications:  continue current medications; Refill accuretic 10-12.5 qd. If BP still elevated at next clinic visit, will increase at that time.  Other plans: RTC in 3-6 months or after patient returns from [place].

## 2014-12-27 NOTE — Progress Notes (Signed)
Subjective:   Patient ID: Mitchell Page male   DOB: 01/23/1956 59 y.o.   MRN: [id number]  HPI: Mitchell Page is a 59 y.o. 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 He has no clinical complaints today, however, he does state that his father has become quite ill in his home of [place] and has to return to help take care of him. He does not know how long he will be there but will return to the [place] in some point in the future. He is interested in having a check up before he leaves in order to have his necessary prescriptions for his travel.   He has been quite compliant w/ his medications and feels that his DM has been better controlled in the recent past. His meter states that his 30 day average CBG is 130. He is currently still using Lantus 20 units and Metformin 1000 mg bid. He denies symptoms of hypoglycemia. He was previously seen in the clinic for fatigue and right shoulder pain, however he states this has resolved since.    Current Outpatient Prescriptions  Medication Sig Dispense Refill  . Blood Glucose Monitoring Suppl (ACCU-CHEK AVIVA PLUS) W/DEVICE KIT 1 Device by Does not apply route 3 (three) times daily. To check blood sugar 3 times daily. diag code 250.00. Insulin dependent 1 kit 0  . Blood Glucose Monitoring Suppl (WAVESENSE AMP) W/DEVICE KIT 1 meter, use to measure blood glucose 3 times daily 1 kit 0  . butalbital-acetaminophen-caffeine (FIORICET) 50-325-40 MG per tablet Take 1-2 tablets by mouth every 6 (six) hours as needed for headache. 30 tablet 0  . cetirizine (ZYRTEC) 10 MG tablet Take 1 tablet (10 mg total) by mouth daily. 90 tablet 1  . EQ LORATADINE 10 MG tablet TAKE ONE TABLET BY MOUTH ONCE DAILY 30 tablet 0  . fluticasone (FLONASE) 50 MCG/ACT nasal spray Place 2 sprays into both nostrils daily. 16 g 2  . gabapentin (NEURONTIN) 300 MG capsule Take 1 capsule (300 mg total) by mouth  3 (three) times daily. 270 capsule 1  . glucose blood (ACCU-CHEK AVIVA) test strip Use to check blood sugar twice daily, before breakfast and after lunch or dinner. 100 each 12  . glucose blood test strip To check CBG 3 times a day 100 each 5  . Insulin Glargine (LANTUS SOLOSTAR) 100 UNIT/ML Solostar Pen Inject 20 nits every evening. Diag code E11.49. Insulin dependent 20 pen 5  . Insulin Pen Needle (PRODIGY MINI PEN NEEDLES) 31G X 5 MM MISC Use as directed for once daily insulin injection. ICD 10: E11.39 300 each 2  . Lancet Devices (ACCU-CHEK SOFTCLIX) lancets Use to check blood sugar twice a day 1 each 5  . mefloquine (LARIAM) 250 MG tablet Take 1 tablet (250 mg total) by mouth every 7 (seven) days. Start taking medicine ONE WEEK before travel, continue FOUR WEEKS after travel (Patient not taking: Reported on 07/07/2014) 20 tablet 0  . metFORMIN (GLUCOPHAGE) 1000 MG tablet TAKE 1 TABLET BY MOUTH TWICE DAILY WITH A MEAL 180 tablet 1  . omeprazole (PRILOSEC) 20 MG capsule TAKE 1 CAPSULE(20 MG) BY MOUTH DAILY 90 capsule 1  . quinapril-hydrochlorothiazide (ACCURETIC) 10-12.5 MG tablet TAKE 1 TABLET BY MOUTH ONCE DAILY 90 tablet 1  . sildenafil (VIAGRA) 50 MG tablet Take 1 tablet (50 mg total) by mouth as needed for erectile dysfunction. 4 tablet 0  .  sodium chloride (OCEAN) 0.65 % nasal spray Place 1 spray into the nose as needed for congestion. 30 mL 2  . traMADol (ULTRAM) 50 MG tablet TAKE 1 TABLET BY MOUTH EVERY 6 HOURS AS NEEDED F 60 tablet 1   No current facility-administered medications for this visit.    Review of Systems: General: Denies fever, chills, diaphoresis, appetite change and fatigue.  Respiratory: Denies SOB, DOE, cough, and wheezing.   Cardiovascular: Denies chest pain and palpitations.  Gastrointestinal: Denies nausea, vomiting, abdominal pain, and diarrhea.  Genitourinary: Denies dysuria, increased frequency, and flank pain. Endocrine: Denies hot or cold intolerance,  polyuria, and polydipsia. Musculoskeletal: Denies myalgias, back pain, joint swelling, arthralgias and gait problem.  Skin: Denies pallor, rash and wounds.  Neurological: Denies dizziness, seizures, syncope, weakness, lightheadedness, numbness and headaches.  Psychiatric/Behavioral: Denies mood changes, and sleep disturbances.  Objective:   Physical Exam: Filed Vitals:   12/26/14 1426  BP: 153/92  Pulse: 73  Temp: 98.4 F (36.9 C)  TempSrc: Oral  Height: 6' (1.829 m)  Weight: 162 lb 4.8 oz (73.619 kg)  SpO2: 99%    General: Alert, cooperative, NAD. HEENT: PERRL, EOMI. Moist mucus membranes Neck: Full range of motion without pain, supple, no lymphadenopathy or carotid bruits Lungs: Clear to ascultation bilaterally, normal work of respiration, no wheezes, rales, rhonchi Heart: RRR, no murmurs, gallops, or rubs Abdomen: Soft, non-tender, non-distended, BS + Extremities: No cyanosis, clubbing, or edema Neurologic: Alert & oriented X3, cranial nerves II-XII intact, strength grossly intact, sensation intact to light touch   Assessment & Plan:   Please see problem based assessment and plan.

## 2014-12-27 NOTE — Assessment & Plan Note (Signed)
Lab Results  Component Value Date   HGBA1C 7.5 11/03/2014   HGBA1C 10.8 07/07/2014   HGBA1C 6.7 11/09/2013     Assessment: Diabetes control:  Improved.  Comments: Patient has been compliant w/ Lantus 20 units qhs + Metformin 1000 mg bid. Reinforced the importance if taking these medications every single day while he travels back to [place].   Plan: Medications:  continue current medications. Provided both Lantus and metformin refills as well as pen needles for travel to [place].  Home glucose monitoring: Frequency:   Timing:   Instruction/counseling given: reminded to bring blood glucose meter & log to each visit, reminded to bring medications to each visit, discussed foot care and discussed diet Other plans: RTC in 3-6 months (after [place] from [place]

## 2014-12-27 NOTE — Assessment & Plan Note (Signed)
Flu shot given today

## 2014-12-27 NOTE — Assessment & Plan Note (Signed)
Refill Prilosec.

## 2014-12-30 NOTE — Progress Notes (Signed)
Internal Medicine Clinic Attending  Case discussed with Dr. Jones soon after the resident saw the patient.  We reviewed the resident's history and exam and pertinent patient test results.  I agree with the assessment, diagnosis, and plan of care documented in the resident's note. 

## 2015-05-14 ENCOUNTER — Telehealth: Payer: Self-pay | Admitting: Internal Medicine

## 2015-05-14 NOTE — Telephone Encounter (Signed)
APPT. REMINDER CALL, NO ANSWER, NO VOICE MAIL °

## 2015-05-15 ENCOUNTER — Encounter: Payer: Medicare Other | Admitting: Internal Medicine

## 2015-06-26 ENCOUNTER — Ambulatory Visit (INDEPENDENT_AMBULATORY_CARE_PROVIDER_SITE_OTHER): Payer: Medicare Other | Admitting: Internal Medicine

## 2015-06-26 ENCOUNTER — Encounter: Payer: Self-pay | Admitting: Internal Medicine

## 2015-06-26 DIAGNOSIS — I10 Essential (primary) hypertension: Secondary | ICD-10-CM

## 2015-06-26 DIAGNOSIS — IMO0002 Reserved for concepts with insufficient information to code with codable children: Secondary | ICD-10-CM

## 2015-06-26 DIAGNOSIS — Z794 Long term (current) use of insulin: Secondary | ICD-10-CM | POA: Diagnosis not present

## 2015-06-26 DIAGNOSIS — E1165 Type 2 diabetes mellitus with hyperglycemia: Secondary | ICD-10-CM

## 2015-06-26 DIAGNOSIS — E118 Type 2 diabetes mellitus with unspecified complications: Principal | ICD-10-CM

## 2015-06-26 DIAGNOSIS — Z79899 Other long term (current) drug therapy: Secondary | ICD-10-CM

## 2015-06-26 LAB — POCT GLYCOSYLATED HEMOGLOBIN (HGB A1C): Hemoglobin A1C: 8.8

## 2015-06-26 LAB — GLUCOSE, CAPILLARY: GLUCOSE-CAPILLARY: 221 mg/dL — AB (ref 65–99)

## 2015-06-26 MED ORDER — GABAPENTIN 300 MG PO CAPS: 300.0000 mg | ORAL_CAPSULE | Freq: Three times a day (TID) | ORAL | Status: DC

## 2015-06-26 MED ORDER — QUINAPRIL-HYDROCHLOROTHIAZIDE 10-12.5 MG PO TABS: ORAL_TABLET | ORAL | Status: DC

## 2015-06-26 MED ORDER — CANAGLIFLOZIN 100 MG PO TABS: 100.0000 mg | ORAL_TABLET | Freq: Every day | ORAL | Status: DC

## 2015-06-26 NOTE — Assessment & Plan Note (Signed)
BP 154/87.  Reports compliance with accuretic.   -cont quinapril-HCTZ -add invokana -check BMP at next OV -f/u in 1 month

## 2015-06-26 NOTE — Patient Instructions (Signed)
Thank you for your visit today.   Please return to the internal medicine clinic in 1 month(s) or sooner if needed.    Please continue your current medications. I have added invokana for your diabetes.  Please take this pill once daily with breakfast.  This has been sent to your pharmacy.   Please be sure to bring all of your medications with you to every visit; this includes herbal supplements, vitamins, eye drops, and any over-the-counter medications.   Should you have any questions regarding your medications and/or any new or worsening symptoms, please be sure to call the clinic at [phone].   If you believe that you are suffering from a life threatening condition or one that may result in the loss of limb or function, then you should call 911 and proceed to the nearest Emergency Department.   A healthy lifestyle and preventative care can promote health and wellness.   Maintain regular health, dental, and eye exams.  Eat a healthy diet. Foods like vegetables, fruits, whole grains, low-fat dairy products, and lean protein foods contain the nutrients you need without too many calories. Decrease your intake of foods high in solid fats, added sugars, and salt. Get information about a proper diet from your caregiver, if necessary.  Regular physical exercise is one of the most important things you can do for your health. Most adults should get at least 150 minutes of moderate-intensity exercise (any activity that increases your heart rate and causes you to sweat) each week. In addition, most adults need muscle-strengthening exercises on 2 or more days a week.   Maintain a healthy weight. The body mass index (BMI) is a screening tool to identify possible weight problems. It provides an estimate of body fat based on height and weight. Your caregiver can help determine your BMI, and can help you achieve or maintain a healthy weight. For adults 20 years and older:  A BMI below 18.5 is  considered underweight.  A BMI of 18.5 to 24.9 is normal.  A BMI of 25 to 29.9 is considered overweight.  A BMI of 30 and above is considered obese.

## 2015-06-26 NOTE — Assessment & Plan Note (Addendum)
Pt has been out of the country and is due to HA1c today.  HA1c today is worsened to 8.8.  He reports eating more pasta, carbs, etc. and not eating good food while he was away.  He states he has experience some low blood sugars around 70 and begins to get "shaky."  He thinks this occurs sometimes before lunch but unfortunately he does not have a meter which can be downloaded.  He reports some blood sugars as high as 300s.  He reports compliance with lantus 20 units but maybe taking 30 units sometimes when his blood sugar is high?  It is difficult to understand exactly what he is saying due to a language barrier.  I instructed him to continue taking the lantus 20 units as prescribed.  He also is on metformin 1000mg  bid.   -cont metformin 1000mg  bid -cont lantus 20 units qhs -add invokana 100mg  today that may also help with HTN -f/u in 1 month  -would consider adding trulicity at next OV
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
## 2015-06-27 NOTE — Progress Notes (Signed)
Case discussed with Dr. Gill soon after the resident saw the patient.  We reviewed the resident's history and exam and pertinent patient test results.  I agree with the assessment, diagnosis, and plan of care documented in the resident's note. 

## 2015-07-09 ENCOUNTER — Other Ambulatory Visit: Payer: Self-pay | Admitting: *Deleted

## 2015-07-10 ENCOUNTER — Other Ambulatory Visit: Payer: Self-pay

## 2015-07-10 MED ORDER — TRAMADOL HCL 50 MG PO TABS: ORAL_TABLET | ORAL | Status: DC

## 2015-07-10 NOTE — Telephone Encounter (Signed)
Refilled Tramadol #120 per previous pain contract. Needs repeat UDS and re-evaluation for continued need of Tramadol at next clinic visit.   Lauris ChromanWoody Libni Fusaro, MD PGY-3, Internal Medicine Pager: [phone]

## 2015-07-10 NOTE — Telephone Encounter (Signed)
Med called in to [facility]- pt aware
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
## 2015-07-24 ENCOUNTER — Encounter (HOSPITAL_COMMUNITY): Payer: Self-pay | Admitting: *Deleted

## 2015-07-24 ENCOUNTER — Emergency Department (HOSPITAL_COMMUNITY)
Admission: EM | Admit: 2015-07-24 | Discharge: 2015-07-24 | Disposition: A | Discharge status: Home / Self Care | Payer: Medicare Other | Attending: Emergency Medicine | Admitting: Emergency Medicine

## 2015-07-24 DIAGNOSIS — K219 Gastro-esophageal reflux disease without esophagitis: Secondary | ICD-10-CM | POA: Diagnosis not present

## 2015-07-24 DIAGNOSIS — G8929 Other chronic pain: Secondary | ICD-10-CM | POA: Insufficient documentation

## 2015-07-24 DIAGNOSIS — Z79899 Other long term (current) drug therapy: Secondary | ICD-10-CM | POA: Insufficient documentation

## 2015-07-24 DIAGNOSIS — Z7951 Long term (current) use of inhaled steroids: Secondary | ICD-10-CM | POA: Diagnosis not present

## 2015-07-24 DIAGNOSIS — E119 Type 2 diabetes mellitus without complications: Secondary | ICD-10-CM | POA: Insufficient documentation

## 2015-07-24 DIAGNOSIS — Z794 Long term (current) use of insulin: Secondary | ICD-10-CM | POA: Diagnosis not present

## 2015-07-24 DIAGNOSIS — I10 Essential (primary) hypertension: Secondary | ICD-10-CM | POA: Insufficient documentation

## 2015-07-24 DIAGNOSIS — Z7984 Long term (current) use of oral hypoglycemic drugs: Secondary | ICD-10-CM | POA: Insufficient documentation

## 2015-07-24 DIAGNOSIS — J3089 Other allergic rhinitis: Secondary | ICD-10-CM | POA: Insufficient documentation

## 2015-07-24 DIAGNOSIS — J0100 Acute maxillary sinusitis, unspecified: Secondary | ICD-10-CM | POA: Diagnosis not present

## 2015-07-24 DIAGNOSIS — R05 Cough: Secondary | ICD-10-CM | POA: Diagnosis present

## 2015-07-24 MED ORDER — FLUTICASONE PROPIONATE 50 MCG/ACT NA SUSP: 2.0000 | Freq: Every day | NASAL | Status: DC

## 2015-07-24 MED ORDER — ACETAMINOPHEN 500 MG PO TABS
1000.0000 mg | ORAL_TABLET | Freq: Once | ORAL | Status: AC
  Administered 2015-07-24: 1000 mg via ORAL
  Filled 2015-07-24: qty 2

## 2015-07-24 MED ORDER — SALINE SPRAY 0.65 % NA SOLN: 1.0000 | NASAL | Status: DC | PRN

## 2015-07-24 NOTE — ED Notes (Signed)
Pt walked to adult side

## 2015-07-24 NOTE — Discharge Instructions (Signed)
Allergic Rhinitis Allergic rhinitis is when the mucous membranes in the nose respond to allergens. Allergens are particles in the air that cause your body to have an allergic reaction. This causes you to release allergic antibodies. Through a chain of events, these eventually cause you to release histamine into the blood stream. Although meant to protect the body, it is this release of histamine that causes your discomfort, such as frequent sneezing, congestion, and an itchy, runny nose.  CAUSES Seasonal allergic rhinitis (hay fever) is caused by pollen allergens that may come from grasses, trees, and weeds. Year-round allergic rhinitis (perennial allergic rhinitis) is caused by allergens such as house dust mites, pet dander, and mold spores. SYMPTOMS  Nasal stuffiness (congestion).  Itchy, runny nose with sneezing and tearing of the eyes. DIAGNOSIS Your health care provider can help you determine the allergen or allergens that trigger your symptoms. If you and your health care provider are unable to determine the allergen, skin or blood testing may be used. Your health care provider will diagnose your condition after taking your health history and performing a physical exam. Your health care provider may assess you for other related conditions, such as asthma, pink eye, or an ear infection. TREATMENT Allergic rhinitis does not have a cure, but it can be controlled by:  Medicines that block allergy symptoms. These may include allergy shots, nasal sprays, and oral antihistamines.  Avoiding the allergen. Hay fever may often be treated with antihistamines in pill or nasal spray forms. Antihistamines block the effects of histamine. There are over-the-counter medicines that may help with nasal congestion and swelling around the eyes. Check with your health care provider before taking or giving this medicine. If avoiding the allergen or the medicine prescribed do not work, there are many new medicines  your health care provider can prescribe. Stronger medicine may be used if initial measures are ineffective. Desensitizing injections can be used if medicine and avoidance does not work. Desensitization is when a patient is given ongoing shots until the body becomes less sensitive to the allergen. Make sure you follow up with your health care provider if problems continue. HOME CARE INSTRUCTIONS It is not possible to completely avoid allergens, but you can reduce your symptoms by taking steps to limit your exposure to them. It helps to know exactly what you are allergic to so that you can avoid your specific triggers. SEEK MEDICAL CARE IF:  You have a fever.  You develop a cough that does not stop easily (persistent).  You have shortness of breath.  You start wheezing.  Symptoms interfere with normal daily activities.   This information is not intended to replace advice given to you by your health care provider. Make sure you discuss any questions you have with your health care provider.   Document Released: 11/26/2000 Document Revised: 03/24/2014 Document Reviewed: 11/08/2012 Elsevier Interactive Patient Education 2016 Else[employer] Inc.  

## 2015-07-24 NOTE — ED Notes (Signed)
Pt waiting in room with daughter, dr Tonette Ledererkuhner in to see daughter.

## 2015-07-24 NOTE — ED Notes (Signed)
Pt states he has had cough and headache since yesterday.he has a scratchy throat and hoarse voice. No fever. No v/d. His cough is congested but non productive. Head pain is 7/10.no meds taken today

## 2015-07-24 NOTE — ED Provider Notes (Signed)
CSN: [id number]     Arrival date & time 07/24/15  0847 History   First MD Initiated Contact with Patient 07/24/15 [phone]     Chief Complaint  Patient presents with  . Cough  . Headache     (Consider location/radiation/quality/duration/timing/severity/associated sxs/prior Treatment) HPI Comments: 60 year old male with history of diabetes, high blood pressure, allergic rhinitis presents with worsening sinus pressure scratchy throat frontal headache for the past few days. No fevers, mild nonproductive cough. Symptoms gradually worsening.  Patient is a 60 y.o. male presenting with cough and headaches. The history is provided by the patient.  Cough Associated symptoms: headaches   Associated symptoms: no chest pain, no chills, no fever, no rash and no shortness of breath   Headache Associated symptoms: congestion and cough   Associated symptoms: no abdominal pain, no back pain, no fever, no neck pain, no neck stiffness and no vomiting     Past Medical History  Diagnosis Date  . GERD (gastroesophageal reflux disease)   . Diabetes mellitus   . Lower back pain     w/ R LE sciatica s/p laminectomy in 2008. MRI- 04/2008  . Chronic headache   . Allergic rhinitis   . Hypertension   . Illiteracy and low-level literacy     Unclear of his literacy  in his native tongue, however, he is unable to read in [place]   Past Surgical History  Procedure Laterality Date  . Lumbar laminectomy  2008    l4-5  . Repair of left arm fracture     History reviewed. No pertinent family history. Social History  Substance Use Topics  . Smoking status: Never Smoker   . Smokeless tobacco: None  . Alcohol Use: No    Review of Systems  Constitutional: Negative for fever and chills.  HENT: Positive for congestion.   Eyes: Negative for visual disturbance.  Respiratory: Positive for cough. Negative for shortness of breath.   Cardiovascular: Negative for chest pain.  Gastrointestinal: Negative for vomiting and  abdominal pain.  Genitourinary: Negative for dysuria and flank pain.  Musculoskeletal: Negative for back pain, neck pain and neck stiffness.  Skin: Negative for rash.  Neurological: Positive for headaches. Negative for light-headedness.      Allergies  Review of patient's allergies indicates no known allergies.  Home Medications   Prior to Admission medications   Medication Sig Start Date End Date Taking? Authorizing Provider  Blood Glucose Monitoring Suppl (ACCU-CHEK AVIVA PLUS) W/DEVICE KIT 1 Device by Does not apply route 3 (three) times daily. To check blood sugar 3 times daily. diag code 250.00. Insulin dependent 08/23/12  Yes Trinda Pascal, MD  Blood Glucose Monitoring Suppl (WAVESENSE AMP) W/DEVICE KIT 1 meter, use to measure blood glucose 3 times daily 04/16/11  Yes Jenelle Mages, MD  canagliflozin [facility]) 100 MG TABS tablet Take 1 tablet (100 mg total) by mouth daily before breakfast. 06/26/15  Yes Jones Bales, MD  cetirizine (ZYRTEC) 10 MG tablet Take 1 tablet (10 mg total) by mouth daily. 07/07/14  Yes Nino Glow McLean-Scocozza, MD  gabapentin (NEURONTIN) 300 MG capsule Take 1 capsule (300 mg total) by mouth 3 (three) times daily. 06/26/15  Yes Jones Bales, MD  glucose blood (ACCU-CHEK AVIVA) test strip Use to check blood sugar twice daily, before breakfast and after lunch or dinner. 08/24/12  Yes Trinda Pascal, MD  Insulin Glargine (LANTUS SOLOSTAR) 100 UNIT/ML Solostar Pen Inject 20 nits every evening. Diag code E11.49. Insulin dependent 12/26/14  Yes Corky Sox, MD  Insulin Pen Needle (PRODIGY MINI PEN NEEDLES) 31G X 5 MM MISC Use as directed for once daily insulin injection. ICD 10: E11.39 12/26/14  Yes Corky Sox, MD  Lancet Devices [facility]) lancets Use to check blood sugar twice a day 08/24/12  Yes Trinda Pascal, MD  metFORMIN (GLUCOPHAGE) 1000 MG tablet TAKE 1 TABLET BY MOUTH TWICE DAILY WITH A MEAL 12/26/14  Yes Corky Sox, MD   omeprazole (PRILOSEC) 20 MG capsule TAKE 1 CAPSULE(20 MG) BY MOUTH DAILY 12/26/14  Yes Corky Sox, MD  quinapril-hydrochlorothiazide (ACCURETIC) 10-12.5 MG tablet TAKE 1 TABLET BY MOUTH ONCE DAILY 06/26/15  Yes Jones Bales, MD  traMADol (ULTRAM) 50 MG tablet TAKE 1 TABLET BY MOUTH EVERY 6 HOURS AS NEEDED F Patient taking differently: Take 50 mg by mouth every 6 (six) hours as needed for moderate pain.  07/10/15  Yes Corky Sox, MD  EQ LORATADINE 10 MG tablet TAKE ONE TABLET BY MOUTH ONCE DAILY Patient not taking: Reported on 07/24/2015 11/30/13   Corky Sox, MD  fluticasone [facility]) 50 MCG/ACT nasal spray Place 2 sprays into both nostrils daily. Patient not taking: Reported on 07/24/2015 07/07/14   Nino Glow McLean-Scocozza, MD  fluticasone (FLONASE) 50 MCG/ACT nasal spray Place 2 sprays into both nostrils daily. 07/24/15   Elnora Morrison, MD  glucose blood test strip To check CBG 3 times a day 08/18/12 07/07/14  Trinda Pascal, MD  mefloquine (LARIAM) 250 MG tablet Take 1 tablet (250 mg total) by mouth every 7 (seven) days. Start taking medicine ONE WEEK before travel, continue FOUR WEEKS after travel Patient not taking: Reported on 07/24/2015 11/09/13   Juluis Mire, MD  sildenafil (VIAGRA) 50 MG tablet Take 1 tablet (50 mg total) by mouth as needed for erectile dysfunction. 06/27/13 06/27/14  Corky Sox, MD  sodium chloride (OCEAN) 0.65 % nasal spray Place 1 spray into the nose as needed for congestion. Patient not taking: Reported on 07/24/2015 07/07/14   Nino Glow McLean-Scocozza, MD  sodium chloride (OCEAN) 0.65 % SOLN nasal spray Place 1 spray into both nostrils as needed for congestion. 07/24/15   Elnora Morrison, MD   BP 169/88 mmHg  Pulse 75  Temp(Src) 97.7 F (36.5 C) (Oral)  Resp 18  SpO2 95% Physical Exam  Constitutional: He is oriented to person, place, and time. He appears well-developed and well-nourished.  HENT:  Head: Normocephalic and atraumatic.  Mild tender maxillary sinuses  congested clinically  Eyes: Right eye exhibits no discharge. Left eye exhibits no discharge.  Neck: Normal range of motion. Neck supple. No tracheal deviation present.  Cardiovascular: Normal rate.   Pulmonary/Chest: Effort normal and breath sounds normal.  Musculoskeletal: He exhibits no edema.  Neurological: He is alert and oriented to person, place, and time.  Skin: Skin is warm. No rash noted.  Psychiatric: He has a normal mood and affect.  Nursing note and vitals reviewed.   ED Course  Procedures (including critical care time) Labs Review Labs Reviewed - No data to display  Imaging Review No results found. I have personally reviewed and evaluated these images and lab results as part of my medical decision-making.   EKG Interpretation None      MDM   Final diagnoses:  Other allergic rhinitis  Acute maxillary sinusitis, recurrence not specified   Patient presents with clinically sinusitis/allergic rhinitis. Discussed supportive care. Medicine refills given.  Results and differential diagnosis were discussed with  the patient/parent/guardian. Xrays were independently reviewed by myself.  Close follow up outpatient was discussed, comfortable with the plan.   Medications  acetaminophen (TYLENOL) tablet 1,000 mg (1,000 mg Oral Given 07/24/15 1037)    Filed Vitals:   07/24/15 0858  BP: 169/88  Pulse: 75  Temp: 97.7 F (36.5 C)  TempSrc: Oral  Resp: 18  SpO2: 95%    Final diagnoses:  Other allergic rhinitis  Acute maxillary sinusitis, recurrence not specified        Elnora Morrison, MD 07/24/15 1040

## 2015-08-01 ENCOUNTER — Ambulatory Visit (INDEPENDENT_AMBULATORY_CARE_PROVIDER_SITE_OTHER): Payer: Medicare Other | Admitting: Internal Medicine

## 2015-08-01 ENCOUNTER — Encounter: Payer: Self-pay | Admitting: Internal Medicine

## 2015-08-01 VITALS — BP 139/84 | HR 73 | Temp 98.3°F | Ht 72.0 in | Wt 157.0 lb

## 2015-08-01 DIAGNOSIS — J309 Allergic rhinitis, unspecified: Secondary | ICD-10-CM | POA: Diagnosis not present

## 2015-08-01 DIAGNOSIS — E1165 Type 2 diabetes mellitus with hyperglycemia: Secondary | ICD-10-CM

## 2015-08-01 DIAGNOSIS — I10 Essential (primary) hypertension: Secondary | ICD-10-CM | POA: Diagnosis not present

## 2015-08-01 DIAGNOSIS — Z794 Long term (current) use of insulin: Secondary | ICD-10-CM | POA: Diagnosis not present

## 2015-08-01 DIAGNOSIS — E1140 Type 2 diabetes mellitus with diabetic neuropathy, unspecified: Secondary | ICD-10-CM

## 2015-08-01 DIAGNOSIS — N529 Male erectile dysfunction, unspecified: Secondary | ICD-10-CM | POA: Diagnosis not present

## 2015-08-01 DIAGNOSIS — Z0000 Encounter for general adult medical examination without abnormal findings: Secondary | ICD-10-CM

## 2015-08-01 DIAGNOSIS — E118 Type 2 diabetes mellitus with unspecified complications: Secondary | ICD-10-CM | POA: Diagnosis not present

## 2015-08-01 DIAGNOSIS — J301 Allergic rhinitis due to pollen: Secondary | ICD-10-CM

## 2015-08-01 LAB — GLUCOSE, CAPILLARY: Glucose-Capillary: 166 mg/dL — ABNORMAL HIGH (ref 65–99)

## 2015-08-01 MED ORDER — SILDENAFIL CITRATE 20 MG PO TABS: ORAL_TABLET | ORAL | Status: DC

## 2015-08-01 NOTE — Patient Instructions (Signed)
1. Please make a follow up appointment for 8 weeks.   2. Please take all medications as previously prescribed.  Continue Invokana.  3. If you have worsening of your symptoms or new symptoms arise, please call the clinic [phone]), or go to the ER immediately if symptoms are severe.  You have done a great job in taking all your medications. Please continue to do this.

## 2015-08-01 NOTE — Progress Notes (Signed)
Subjective:   Patient ID: Mitchell Page male   DOB: 03-May-1955 60 y.o.   MRN: [id number]  HPI: Mitchell Page is a 60 y.o. 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 Patient seen on 06/26/15 at which time his HbA1c was found to be elevated. He was started on Invokana in addition to Metformin and Lantus 20 units qhs. Since that time he states his sugars have been much better controlled. According to his home readings, his average has been ~140. No hypoglycemia. Tolerating medication well. Patient also asking about medication for erectile dysfunction. Patient was previously given Viagra, however, he was unable to purchase this because of cost.   Current Outpatient Prescriptions  Medication Sig Dispense Refill  . Blood Glucose Monitoring Suppl (ACCU-CHEK AVIVA PLUS) W/DEVICE KIT 1 Device by Does not apply route 3 (three) times daily. To check blood sugar 3 times daily. diag code 250.00. Insulin dependent 1 kit 0  . Blood Glucose Monitoring Suppl (WAVESENSE AMP) W/DEVICE KIT 1 meter, use to measure blood glucose 3 times daily 1 kit 0  . canagliflozin (INVOKANA) 100 MG TABS tablet Take 1 tablet (100 mg total) by mouth daily before breakfast. 30 tablet 3  . cetirizine (ZYRTEC) 10 MG tablet Take 1 tablet (10 mg total) by mouth daily. 90 tablet 1  . fluticasone (FLONASE) 50 MCG/ACT nasal spray Place 2 sprays into both nostrils daily. 16 g 0  . gabapentin (NEURONTIN) 300 MG capsule Take 1 capsule (300 mg total) by mouth 3 (three) times daily. 270 capsule 1  . glucose blood (ACCU-CHEK AVIVA) test strip Use to check blood sugar twice daily, before breakfast and after lunch or dinner. 100 each 12  . glucose blood test strip To check CBG 3 times a day 100 each 5  . Insulin Glargine (LANTUS SOLOSTAR) 100 UNIT/ML Solostar Pen Inject 20 nits every evening. Diag code E11.49. Insulin dependent 20 pen 5  . Insulin  Pen Needle (PRODIGY MINI PEN NEEDLES) 31G X 5 MM MISC Use as directed for once daily insulin injection. ICD 10: E11.39 300 each 2  . Lancet Devices (ACCU-CHEK SOFTCLIX) lancets Use to check blood sugar twice a day 1 each 5  . metFORMIN (GLUCOPHAGE) 1000 MG tablet TAKE 1 TABLET BY MOUTH TWICE DAILY WITH A MEAL 180 tablet 1  . omeprazole (PRILOSEC) 20 MG capsule TAKE 1 CAPSULE(20 MG) BY MOUTH DAILY 90 capsule 1  . quinapril-hydrochlorothiazide (ACCURETIC) 10-12.5 MG tablet TAKE 1 TABLET BY MOUTH ONCE DAILY 90 tablet 0  . sildenafil (REVATIO) 20 MG tablet Take 40 mg (2 tablets) no more than once daily as needed prior to sexual activity 30 tablet 0  . sildenafil (VIAGRA) 50 MG tablet Take 1 tablet (50 mg total) by mouth as needed for erectile dysfunction. 4 tablet 0  . sodium chloride (OCEAN) 0.65 % SOLN nasal spray Place 1 spray into both nostrils as needed for congestion. 1 Bottle 0  . traMADol (ULTRAM) 50 MG tablet TAKE 1 TABLET BY MOUTH EVERY 6 HOURS AS NEEDED F (Patient taking differently: Take 50 mg by mouth every 6 (six) hours as needed for moderate pain. ) 120 tablet 0   No current facility-administered medications for this visit.    Review of Systems: General: Denies fever, chills, diaphoresis, appetite change and fatigue.  Respiratory: Denies SOB, DOE, cough, and wheezing.   Cardiovascular: Denies chest pain and  palpitations.  Gastrointestinal: Denies nausea, vomiting, abdominal pain, and diarrhea.  Genitourinary: Denies dysuria, increased frequency, and flank pain. Endocrine: Denies hot or cold intolerance, polyuria, and polydipsia. Musculoskeletal: Denies myalgias, back pain, joint swelling, arthralgias and gait problem.  Skin: Denies pallor, rash and wounds.  Neurological: Denies dizziness, seizures, syncope, weakness, lightheadedness, numbness and headaches.  Psychiatric/Behavioral: Denies mood changes, and sleep disturbances.  Objective:   Physical Exam: Filed Vitals:    08/01/15 1504  BP: 139/84  Pulse: 73  Temp: 98.3 F (36.8 C)  TempSrc: Oral  Height: 6' (1.829 m)  Weight: 157 lb (71.215 kg)  SpO2: 100%    General: Alert, cooperative, NAD. HEENT: PERRL, EOMI. Moist mucus membranes Neck: Full range of motion without pain, supple, no lymphadenopathy or carotid bruits Lungs: Clear to ascultation bilaterally, normal work of respiration, no wheezes, rales, rhonchi Heart: RRR, no murmurs, gallops, or rubs Abdomen: Soft, non-tender, non-distended, BS + Extremities: No cyanosis, clubbing, or edema Neurologic: Alert & oriented X3, cranial nerves II-XII intact, strength grossly intact, sensation intact to light touch   Assessment & Plan:   Please see problem based assessment and plan.

## 2015-08-02 LAB — BMP8+ANION GAP
Anion Gap: 16 mmol/L (ref 10.0–18.0)
BUN / CREAT RATIO: 14 (ref 10–24)
BUN: 12 mg/dL (ref 8–27)
CHLORIDE: 100 mmol/L (ref 96–106)
CO2: 23 mmol/L (ref 18–29)
Calcium: 10.1 mg/dL (ref 8.6–10.2)
Creatinine, Ser: 0.85 mg/dL (ref 0.76–1.27)
GFR calc non Af Amer: 95 mL/min/{1.73_m2} (ref >59)
GFR, EST AFRICAN AMERICAN: 109 mL/min/{1.73_m2} (ref >59)
GLUCOSE: 135 mg/dL — AB (ref 65–99)
POTASSIUM: 4.2 mmol/L (ref 3.5–5.2)
SODIUM: 139 mmol/L (ref 134–144)

## 2015-08-02 LAB — LIPID PANEL
CHOLESTEROL TOTAL: 161 mg/dL (ref 100–199)
Chol/HDL Ratio: 2.8 ratio units (ref 0.0–5.0)
HDL: 57 mg/dL (ref >39)
LDL Calculated: 95 mg/dL (ref 0–99)
Triglycerides: 47 mg/dL (ref 0–149)
VLDL CHOLESTEROL CAL: 9 mg/dL (ref 5–40)

## 2015-08-02 NOTE — Assessment & Plan Note (Signed)
Checked lipid panel today, based on ASCVD risk, would qualify for high intensity statin -Start Lipitor 40 mg daily at next clinic visit

## 2015-08-02 NOTE — Assessment & Plan Note (Signed)
BP Readings from Last 3 Encounters:  08/01/15 139/84  07/24/15 139/84  06/26/15 154/87    Lab Results  Component Value Date   NA 139 08/01/2015   K 4.2 08/01/2015   CREATININE 0.85 08/01/2015    Assessment: Blood pressure control:  Well controlled Progress toward BP goal:   At goal Comments: Taking Quinapril HCTZ 10-12.5  Plan: Medications:  continue current medications. IF BP elevated at next visit, can increase Accuretic dose.  Educational resources provided: brochure (denies) Self management tools provided:   Other plans: Checked BMP today, Cr at baseline. No electrolyte abnormalities.

## 2015-08-02 NOTE — Assessment & Plan Note (Signed)
Continues to complain of erectile dysfunction. Has had benefit from Viagra in the past, but not able to afford.  -Given Rx for Revatio; will take 40 mg daily prn for ED -Given coupon from [facility] for affordable medication at [facility] in [place]

## 2015-08-02 NOTE — Assessment & Plan Note (Signed)
Complaining of allergies. Has had good control with Zyrtec -Given Rx for Zyrtec 10 mg daily

## 2015-08-02 NOTE — Assessment & Plan Note (Addendum)
CBG's improved, average ~140-150 after starting Invokana.  -Continue Lantus 20 units, Invokana 100 mg daily, Metformin 1000 mg bid -Neurontin for neuropathy

## 2015-08-03 DIAGNOSIS — E113312 Type 2 diabetes mellitus with moderate nonproliferative diabetic retinopathy with macular edema, left eye: Secondary | ICD-10-CM | POA: Diagnosis not present

## 2015-08-03 DIAGNOSIS — E113211 Type 2 diabetes mellitus with mild nonproliferative diabetic retinopathy with macular edema, right eye: Secondary | ICD-10-CM | POA: Diagnosis not present

## 2015-08-03 LAB — HM DIABETES EYE EXAM

## 2015-08-06 ENCOUNTER — Other Ambulatory Visit: Payer: Self-pay | Admitting: Internal Medicine

## 2015-08-06 NOTE — Telephone Encounter (Signed)
Discussed with pt's pcp, request for 90-supply denied at this time.Mitchell Page.Bardia Wangerin Cassady5/22/20173:09 PM

## 2015-08-06 NOTE — Progress Notes (Signed)
Internal Medicine Clinic Attending  Case discussed with Dr. Jones at the time of the visit.  We reviewed the resident's history and exam and pertinent patient test results.  I agree with the assessment, diagnosis, and plan of care documented in the resident's note.  

## 2015-08-27 ENCOUNTER — Encounter: Payer: Self-pay | Admitting: *Deleted

## 2015-09-28 ENCOUNTER — Other Ambulatory Visit: Payer: Self-pay | Admitting: Internal Medicine

## 2015-09-28 NOTE — Telephone Encounter (Signed)
Needs appointment Last visit 07/2015 Last uds, cant find any

## 2015-10-01 ENCOUNTER — Other Ambulatory Visit: Payer: Self-pay | Admitting: Student in an Organized Health Care Education/Training Program

## 2015-10-01 NOTE — Telephone Encounter (Signed)
Called to pharmacy 

## 2015-10-16 DIAGNOSIS — E113211 Type 2 diabetes mellitus with mild nonproliferative diabetic retinopathy with macular edema, right eye: Secondary | ICD-10-CM | POA: Diagnosis not present

## 2015-10-19 ENCOUNTER — Other Ambulatory Visit: Payer: Self-pay | Admitting: Internal Medicine

## 2015-10-19 DIAGNOSIS — IMO0002 Reserved for concepts with insufficient information to code with codable children: Secondary | ICD-10-CM

## 2015-10-19 DIAGNOSIS — E1165 Type 2 diabetes mellitus with hyperglycemia: Secondary | ICD-10-CM

## 2015-10-19 DIAGNOSIS — E118 Type 2 diabetes mellitus with unspecified complications: Principal | ICD-10-CM

## 2015-10-19 DIAGNOSIS — Z794 Long term (current) use of insulin: Principal | ICD-10-CM

## 2015-10-24 ENCOUNTER — Ambulatory Visit (INDEPENDENT_AMBULATORY_CARE_PROVIDER_SITE_OTHER): Payer: Medicare Other | Admitting: Internal Medicine

## 2015-10-24 ENCOUNTER — Encounter: Payer: Self-pay | Admitting: Internal Medicine

## 2015-10-24 DIAGNOSIS — M542 Cervicalgia: Secondary | ICD-10-CM | POA: Diagnosis not present

## 2015-10-24 DIAGNOSIS — R202 Paresthesia of skin: Secondary | ICD-10-CM | POA: Diagnosis not present

## 2015-10-24 DIAGNOSIS — IMO0002 Reserved for concepts with insufficient information to code with codable children: Secondary | ICD-10-CM

## 2015-10-24 MED ORDER — DICLOFENAC SODIUM 1 % TD GEL: 4.0000 g | Freq: Four times a day (QID) | TRANSDERMAL | 5 refills | Status: DC

## 2015-10-24 NOTE — Assessment & Plan Note (Signed)
Patient is here for left sided neck pain that started yesterday evening when he was sitting on the cough. He denies doing anything or sudden motion of the head. He says his muscle seems to be sore. He denies headaches, dizziness, fevers, chills.  He had right sided neck pain 2 months ago and for that he tool tramadol which helped. On exam, left neck muscles seem to be tense.  He has not tried ibuprofen for the left neck pain.  A: Acute left neck pain of musculoskeletal etiology Plan -Ibuprofen 400 mg PRN for pain -given voltaren gel -heating pads -massage  -F/u in 2-4 weeks if it does not improve

## 2015-10-24 NOTE — Progress Notes (Signed)
   CC: left sided neck pain, and some peripheral neuropathy/sciatica HPI: [name] is a 60 y.o. man with PMH noted below here for left sided neck pain and peripheral neuropathy/sciatica   Please see Problem List/A&P for the status of the patient's chronic medical problems   Past Medical History:  Diagnosis Date  . Allergic rhinitis   . Chronic headache   . Diabetes mellitus   . GERD (gastroesophageal reflux disease)   . Hypertension   . Illiteracy and low-level literacy    Unclear of his literacy  in his native tongue, however, he is unable to read in [place]  . Lower back pain    w/ R LE sciatica s/p laminectomy in 2008. MRI- 04/2008    Review of Systems:  Denies fevers, chills Has left sided neck pain. Has tingling in right feet and left feet, and some shooting pain in right leg  Denies n/v  Physical Exam: Vitals:   10/24/15 1532  BP: 124/81  Pulse: 83  Temp: 97.9 F (36.6 C)  TempSrc: Oral  SpO2: 100%  Weight: 153 lb 3.2 oz (69.5 kg)  Height: 5\' 9"  (1.753 m)    General: A&O, in NAD Neck: left sided neck muscle spasm. Right sided neck non tender to palpation CV: RRR, normal s1, s2, no m/r/g Resp: equal and symmetric breath sounds, no wheezing or crackles  Abdomen: soft, nontender, nondistended, +BS Extremities: negative str leg test b/l. No edema. Normal pulses dp/pt and popliteal  Assessment & Plan:   See encounters tab for problem based medical decision making. Patient discussed with Dr. Cyndie ChimeGranfortuna

## 2015-10-24 NOTE — Patient Instructions (Addendum)
Thank you for your visit today  Please take the ibuprofen 400 mg as needed for the neck pain. Use heating pads, and massage it. Also, you can use voltaren gel upto 4 times a day which I have prescribed it.  Please use gabapentin 300 mg three times a day, not two times a day- hopefully it may improve your tingling sensation in the right leg.

## 2015-10-24 NOTE — Progress Notes (Signed)
Medicine attending: Medical history, presenting problems, physical findings, and medications, reviewed with resident physician Dr Parth Saraiya on the day of the patient visit and I concur with his evaluation and management plan. 

## 2015-10-24 NOTE — Assessment & Plan Note (Signed)
Patient says that sometimes he gets shooting pain down his right thigh, and also tingling/burning in both feet. He has diabetic neuropathy, and he was prescribed gabapentin 300 mg TID. He currently only takes 300 mg BID.  -Asked him to take gabapentin  TID. Explained there is room to go up if he continues to have symptoms. Explained side effects of drowsiness.  Follow up with PCP

## 2015-11-06 ENCOUNTER — Other Ambulatory Visit: Payer: Self-pay | Admitting: *Deleted

## 2015-11-06 DIAGNOSIS — IMO0002 Reserved for concepts with insufficient information to code with codable children: Secondary | ICD-10-CM

## 2015-11-06 DIAGNOSIS — E1165 Type 2 diabetes mellitus with hyperglycemia: Secondary | ICD-10-CM

## 2015-11-06 DIAGNOSIS — Z794 Long term (current) use of insulin: Principal | ICD-10-CM

## 2015-11-06 DIAGNOSIS — E118 Type 2 diabetes mellitus with unspecified complications: Principal | ICD-10-CM

## 2015-11-06 MED ORDER — CANAGLIFLOZIN 100 MG PO TABS: 100.0000 mg | ORAL_TABLET | Freq: Every day | ORAL | 3 refills | Status: DC

## 2015-11-09 ENCOUNTER — Encounter: Payer: Self-pay | Admitting: Internal Medicine

## 2015-11-09 ENCOUNTER — Ambulatory Visit (INDEPENDENT_AMBULATORY_CARE_PROVIDER_SITE_OTHER): Payer: Medicare Other | Admitting: Internal Medicine

## 2015-11-09 DIAGNOSIS — Z23 Encounter for immunization: Secondary | ICD-10-CM | POA: Diagnosis present

## 2015-11-09 DIAGNOSIS — M792 Neuralgia and neuritis, unspecified: Secondary | ICD-10-CM | POA: Diagnosis not present

## 2015-11-09 MED ORDER — MULTI-VITAMIN/MINERALS PO TABS: 1.0000 | ORAL_TABLET | Freq: Every day | ORAL | 2 refills | Status: AC

## 2015-11-09 NOTE — Progress Notes (Signed)
Internal Medicine Clinic Attending  Case discussed with Dr. Saraiya soon after the resident saw the patient.  We reviewed the resident's history and exam and pertinent patient test results.  I agree with the assessment, diagnosis, and plan of care documented in the resident's note.  

## 2015-11-09 NOTE — Patient Instructions (Signed)
Thank you for your visit today  Please do the x rays of the left arm   If the x rays do not show anything,and if you continue to have tingling, then we may have to do a test of your nerves in the arm

## 2015-11-09 NOTE — Progress Notes (Signed)
   CC: left forearm tingling below elbow HPI: [name] is a 60 y.o. man with PMH noted below who is here for below-elbow forearm tingling .   Please see Problem List/A&P for the status of the patient's chronic medical problems   Past Medical History:  Diagnosis Date  . Allergic rhinitis   . Chronic headache   . Diabetes mellitus   . GERD (gastroesophageal reflux disease)   . Hypertension   . Illiteracy and low-level literacy    Unclear of his literacy  in his native tongue, however, he is unable to read in [place]  . Lower back pain    w/ R LE sciatica s/p laminectomy in 2008. MRI- 04/2008    Review of Systems:  Constitutional: Negative for fever, chills, weight loss and malaise/fatigue.  Neck: denies neck tingling or numbness. Says neck pain is better         Denies radicular left arm pain  Gastrointestinal: Negative for nausea, vomiting, abdominal pain, diarrhea    Physical Exam: Vitals:   11/09/15 1359  BP: (!) 154/87  Pulse: 76  Temp: 97.6 F (36.4 C)  TempSrc: Oral  SpO2: 99%  Weight: 156 lb 12.8 oz (71.1 kg)  Height: 5\' 9"  (1.753 m)    General: A&O, in NAD Neck: no neck tenderness,  CV: RRR, normal s1, s2, no m/r/g, Resp: equal and symmetric breath sounds, no wheezing or crackles  MSK/Neurologic: normal strength 5/5 in upper extremities.         Negative tinel sign.        Normal muscle tone Negative tinel sign at elbow and negative pressure test         Assessment & Plan:   See encounters tab for problem based medical decision making. Patient discussed with Dr. Criselda PeachesMullen

## 2015-11-09 NOTE — Assessment & Plan Note (Signed)
Patient is here for tingling of his left forearm for 2 weeks. He says that he does not recall what started it. He was seen here on Aug 10th for neck pain which had resolved, but the forearm tingling started few days after that. He says that the tingling is only down from his elbow to his fingers. Nothing makes it better or worse and it can come on and off at any time. He notices it when he is awake.   He had an accident in 2010 and x-rays show displacement of ulna and radius, and he had an ORIF with nail placement after that. He thinks this may be causing his symptoms  On exam, normal strength in UE. Negative tinel sign at the wrist.  Negative tinel sign at the elbow. Pressure test negative. Unlikely to be ulnar nerve entrapment .   Plan Ordered xrays of the left forearm and elbow If xrays are negative, then pt may need to do nerve conduction test

## 2015-11-13 ENCOUNTER — Ambulatory Visit (HOSPITAL_COMMUNITY)
Admission: RE | Admit: 2015-11-13 | Discharge: 2015-11-13 | Disposition: A | Discharge status: Home / Self Care | Payer: Medicare Other | Source: Ambulatory Visit | Attending: Internal Medicine | Admitting: Internal Medicine

## 2015-11-13 DIAGNOSIS — R202 Paresthesia of skin: Secondary | ICD-10-CM | POA: Diagnosis not present

## 2015-11-13 DIAGNOSIS — M792 Neuralgia and neuritis, unspecified: Secondary | ICD-10-CM | POA: Diagnosis not present

## 2015-12-04 ENCOUNTER — Other Ambulatory Visit: Payer: Self-pay | Admitting: *Deleted

## 2015-12-04 DIAGNOSIS — I10 Essential (primary) hypertension: Secondary | ICD-10-CM

## 2015-12-04 DIAGNOSIS — E118 Type 2 diabetes mellitus with unspecified complications: Secondary | ICD-10-CM

## 2015-12-04 DIAGNOSIS — Z794 Long term (current) use of insulin: Secondary | ICD-10-CM

## 2015-12-04 DIAGNOSIS — IMO0002 Reserved for concepts with insufficient information to code with codable children: Secondary | ICD-10-CM

## 2015-12-04 DIAGNOSIS — E1165 Type 2 diabetes mellitus with hyperglycemia: Secondary | ICD-10-CM

## 2015-12-04 MED ORDER — QUINAPRIL-HYDROCHLOROTHIAZIDE 10-12.5 MG PO TABS: ORAL_TABLET | ORAL | 0 refills | Status: DC

## 2016-01-01 NOTE — Assessment & Plan Note (Signed)
ASCVD 19.0%, high intensity statin indicated. -atorvastatin 40 mg daily

## 2016-01-01 NOTE — Assessment & Plan Note (Addendum)
Lab Results  Component Value Date   HGBA1C 8.8 06/26/2015   HGBA1C 7.5 11/03/2014   HGBA1C 10.8 07/07/2014   CBG (last 3)   Recent Labs  01/07/16 1411  GLUCAP 243*     Current medications: metformin 1000 mg BID, canagliflozin 100 mg daily Current insulin: lantus 20U QHS  Ran out of canagliflozin weeks ago.  Take Lantus only occasionally.  Assessment HgbA1c goal: <7.0% Glycemic control: uncontrolled Complications: neuropathy, NPDR OS  With his thin body habitus and glycemic control requiring multiple agents, he way by Type 1/LADA case.  Elucidating etiology of his diabetes will guide further management and focus insulin vs other hypoglycemics.  Plan Medications: continue metformin 1000 mg BID, canagliflozin 100 mg daily Insulin: continue Lantus 20U QHS Other: -Type 1 autoantibodies (anti-GAD, islet cell)

## 2016-01-01 NOTE — Progress Notes (Signed)
   CC: back pain  HPI:  [name] is a 60 y.o. man with history of DM2 (last A1c 8.8%) and HTN who presents for management of his diabetes.  Please see A&P for status of the patient's chronic medical conditions.  He does not read [place], which has caused confusion about a number of his medications.  He has requested renewal of his Disabled Parking Permit because of chronic back pain. Ran out of Tramadol over the weekend.   He was seen twice in clinic in 10/2015 for musculoskeletal neck pain and also parasthesias of left forearm.  He has chronic numbness and weakness in his L hand since a fracture in 2000.  Diarrhea on Sat, has since resolved.  No fever.  No sick contacts. No dyspnea/cough/chest pain. No blood in stool or toilet paper.   Past Medical History:  Diagnosis Date  . Allergic rhinitis   . Chronic headache   . Diabetes mellitus   . GERD (gastroesophageal reflux disease)   . Hypertension   . Illiteracy and low-level literacy    Unclear of his literacy  in his native tongue, however, he is unable to read in [place]  . Lower back pain    w/ R LE sciatica s/p laminectomy in 2008. MRI- 04/2008    Review of Systems:  Review of Systems  Constitutional: Negative for chills and fever.  Respiratory: Negative for cough and wheezing.   Cardiovascular: Negative for chest pain and palpitations.  Gastrointestinal: Positive for abdominal pain and diarrhea. Negative for blood in stool, melena, nausea and vomiting.  Musculoskeletal: Positive for back pain and myalgias.  Neurological: Positive for sensory change.   Physical Exam:  Vitals:   01/07/16 1411  BP: (!) 156/75  Pulse: 89  Temp: 98.3 F (36.8 C)  TempSrc: Oral  SpO2: 100%  Weight: 152 lb 11.2 oz (69.3 kg)  Height: 5\' 9"  (1.753 m)   Physical Exam  Constitutional: He is oriented to person, place, and time.  Thin man in no distress  Cardiovascular: Normal rate and regular rhythm.   Pulmonary/Chest: Effort  normal and breath sounds normal.  Abdominal: Soft. He exhibits no distension. There is no tenderness.  Musculoskeletal: He exhibits no edema, tenderness or deformity.  Neurological: He is alert and oriented to person, place, and time.  Psychiatric: He has a normal mood and affect. His behavior is normal.   BMP Latest Ref Rng & Units 08/01/2015 11/03/2014 07/07/2014  Glucose 65 - 99 mg/dL 098(J135(H) 75 191(Y182(H)  BUN 8 - 27 mg/dL 12 10 12   Creatinine 0.76 - 1.27 mg/dL 7.820.85 9.560.87 2.130.87  BUN/Creat Ratio 10 - 24 14 - -  Sodium 134 - 144 mmol/L 139 138 133(L)  Potassium 3.5 - 5.2 mmol/L 4.2 3.6 3.9  Chloride 96 - 106 mmol/L 100 102 101  CO2 18 - 29 mmol/L 23 28 23   Calcium 8.6 - 10.2 mg/dL 08.610.1 9.4 9.1    Assessment & Plan:   See Encounters Tab for problem based charting.  Patient seen with Dr. Josem KaufmannKlima

## 2016-01-01 NOTE — Assessment & Plan Note (Addendum)
BP Readings from Last 3 Encounters:  01/07/16 (!) 156/75  11/09/15 (!) 154/87  10/24/15 124/81   Lab Results  Component Value Date   CREATININE 0.85 08/01/2015   Lab Results  Component Value Date   K 4.2 08/01/2015    Current medications: quinapril-HCTZ 10-12.5 mg daily  Thinks he has been taking cetirizine instead of his antihypertensive combination for last couple of months.  Cannot read English, so goes by memory which pills to take how often.  Assessment BP goal: 140/90 BP control: above goal  Plan Medications: continue current meds Other:  -Suggested that when he picks up his refills, go over meds with pharmacist and write dose and indication on bottle in his native language

## 2016-01-04 ENCOUNTER — Telehealth: Payer: Self-pay | Admitting: Internal Medicine

## 2016-01-04 NOTE — Telephone Encounter (Signed)
APT. REMINDER CALL, NO ANSWER, NO VOICEMAIL °

## 2016-01-07 ENCOUNTER — Ambulatory Visit (INDEPENDENT_AMBULATORY_CARE_PROVIDER_SITE_OTHER): Payer: Medicare Other | Admitting: Internal Medicine

## 2016-01-07 ENCOUNTER — Encounter: Payer: Self-pay | Admitting: Internal Medicine

## 2016-01-07 VITALS — BP 156/75 | HR 89 | Temp 98.3°F | Ht 69.0 in | Wt 152.7 lb

## 2016-01-07 DIAGNOSIS — E113292 Type 2 diabetes mellitus with mild nonproliferative diabetic retinopathy without macular edema, left eye: Secondary | ICD-10-CM

## 2016-01-07 DIAGNOSIS — Z8781 Personal history of (healed) traumatic fracture: Secondary | ICD-10-CM

## 2016-01-07 DIAGNOSIS — I10 Essential (primary) hypertension: Secondary | ICD-10-CM | POA: Diagnosis not present

## 2016-01-07 DIAGNOSIS — E1140 Type 2 diabetes mellitus with diabetic neuropathy, unspecified: Secondary | ICD-10-CM

## 2016-01-07 DIAGNOSIS — Z79899 Other long term (current) drug therapy: Secondary | ICD-10-CM

## 2016-01-07 DIAGNOSIS — E1165 Type 2 diabetes mellitus with hyperglycemia: Secondary | ICD-10-CM

## 2016-01-07 DIAGNOSIS — Z794 Long term (current) use of insulin: Secondary | ICD-10-CM | POA: Diagnosis not present

## 2016-01-07 DIAGNOSIS — IMO0002 Reserved for concepts with insufficient information to code with codable children: Secondary | ICD-10-CM

## 2016-01-07 DIAGNOSIS — E118 Type 2 diabetes mellitus with unspecified complications: Principal | ICD-10-CM

## 2016-01-07 DIAGNOSIS — Z0000 Encounter for general adult medical examination without abnormal findings: Secondary | ICD-10-CM

## 2016-01-07 LAB — GLUCOSE, CAPILLARY: GLUCOSE-CAPILLARY: 243 mg/dL — AB (ref 65–99)

## 2016-01-07 LAB — POCT GLYCOSYLATED HEMOGLOBIN (HGB A1C): HEMOGLOBIN A1C: 8.7

## 2016-01-07 MED ORDER — TRAMADOL HCL 50 MG PO TABS: 50.0000 mg | ORAL_TABLET | Freq: Four times a day (QID) | ORAL | 0 refills | Status: DC | PRN

## 2016-01-07 MED ORDER — ATORVASTATIN CALCIUM 40 MG PO TABS
40.0000 mg | ORAL_TABLET | Freq: Every day | ORAL | 3 refills | Status: DC
Start: 2016-01-07 — End: 2016-05-12

## 2016-01-07 MED ORDER — QUINAPRIL-HYDROCHLOROTHIAZIDE 10-12.5 MG PO TABS: ORAL_TABLET | ORAL | 3 refills | Status: DC

## 2016-01-07 NOTE — Patient Instructions (Addendum)
You were seen today for management of diabetes and hypertension.  Please bring all your medicines with you to the pharmacy and pick up your refills. Go over your medicines and medication list with the pharmacist, and write on the bottles what they are for and how often to take them.  For your diabetes, take 20U Lantus EVERY NIGHT.  We will do some blood tests to see what kind of diabetes you have.

## 2016-01-09 LAB — GLUTAMIC ACID DECARBOXYLASE AUTO ABS

## 2016-01-09 LAB — ANTI-ISLET CELL ANTIBODY: Islet Cell Ab: NEGATIVE

## 2016-01-13 NOTE — Progress Notes (Signed)
I saw and evaluated the patient.  I personally confirmed the key portions of Dr. Arvil Chaco'Sullivan's history and exam and reviewed pertinent patient test results.  The assessment, diagnosis, and plan were formulated together and I agree with the documentation in the resident's note.  The GAD65 and IA-2 autoantibodies were negative.  Can consider obtaining the ZnT8 autoantibody in the future should Type 1 late onset autoimmune diabetes remain in the differential given his body habitus that is not consistent with the typical insulin resistance of type 2 diabetes.

## 2016-01-17 ENCOUNTER — Other Ambulatory Visit: Payer: Self-pay | Admitting: Internal Medicine

## 2016-01-17 ENCOUNTER — Other Ambulatory Visit: Payer: Self-pay | Admitting: Oncology

## 2016-01-17 DIAGNOSIS — E1165 Type 2 diabetes mellitus with hyperglycemia: Principal | ICD-10-CM

## 2016-01-17 DIAGNOSIS — IMO0002 Reserved for concepts with insufficient information to code with codable children: Secondary | ICD-10-CM

## 2016-01-17 DIAGNOSIS — E118 Type 2 diabetes mellitus with unspecified complications: Secondary | ICD-10-CM

## 2016-01-17 DIAGNOSIS — Z794 Long term (current) use of insulin: Principal | ICD-10-CM

## 2016-01-18 ENCOUNTER — Other Ambulatory Visit: Payer: Self-pay | Admitting: *Deleted

## 2016-01-18 DIAGNOSIS — IMO0002 Reserved for concepts with insufficient information to code with codable children: Secondary | ICD-10-CM

## 2016-01-18 DIAGNOSIS — E118 Type 2 diabetes mellitus with unspecified complications: Secondary | ICD-10-CM

## 2016-01-18 DIAGNOSIS — Z794 Long term (current) use of insulin: Principal | ICD-10-CM

## 2016-01-18 DIAGNOSIS — E1165 Type 2 diabetes mellitus with hyperglycemia: Principal | ICD-10-CM

## 2016-01-18 MED ORDER — INSULIN GLARGINE 100 UNIT/ML SOLOSTAR PEN: PEN_INJECTOR | SUBCUTANEOUS | 5 refills | Status: DC

## 2016-02-11 ENCOUNTER — Encounter: Payer: Self-pay | Admitting: *Deleted

## 2016-02-19 ENCOUNTER — Encounter: Payer: Self-pay | Admitting: Internal Medicine

## 2016-02-19 ENCOUNTER — Other Ambulatory Visit: Payer: Self-pay | Admitting: Internal Medicine

## 2016-02-19 ENCOUNTER — Ambulatory Visit (INDEPENDENT_AMBULATORY_CARE_PROVIDER_SITE_OTHER): Payer: Medicare Other | Admitting: Internal Medicine

## 2016-02-19 VITALS — BP 145/76 | HR 79 | Temp 97.7°F | Ht 69.0 in | Wt 154.4 lb

## 2016-02-19 DIAGNOSIS — Z79899 Other long term (current) drug therapy: Secondary | ICD-10-CM

## 2016-02-19 DIAGNOSIS — E1165 Type 2 diabetes mellitus with hyperglycemia: Secondary | ICD-10-CM

## 2016-02-19 DIAGNOSIS — E113212 Type 2 diabetes mellitus with mild nonproliferative diabetic retinopathy with macular edema, left eye: Secondary | ICD-10-CM | POA: Diagnosis not present

## 2016-02-19 DIAGNOSIS — E113291 Type 2 diabetes mellitus with mild nonproliferative diabetic retinopathy without macular edema, right eye: Secondary | ICD-10-CM | POA: Diagnosis not present

## 2016-02-19 DIAGNOSIS — I10 Essential (primary) hypertension: Secondary | ICD-10-CM

## 2016-02-19 DIAGNOSIS — Z794 Long term (current) use of insulin: Secondary | ICD-10-CM | POA: Diagnosis not present

## 2016-02-19 DIAGNOSIS — E118 Type 2 diabetes mellitus with unspecified complications: Secondary | ICD-10-CM | POA: Diagnosis not present

## 2016-02-19 DIAGNOSIS — IMO0002 Reserved for concepts with insufficient information to code with codable children: Secondary | ICD-10-CM

## 2016-02-19 MED ORDER — AMLODIPINE BESYLATE 5 MG PO TABS: 5.0000 mg | ORAL_TABLET | Freq: Every day | ORAL | 1 refills | Status: DC

## 2016-02-19 NOTE — Assessment & Plan Note (Signed)
Reports compliance with Metformin 2000 mg daily, Lantus 20 units and Invokana 100 mg daily. States blood sugars at home have been ranging from 110-140's after meals. Denied any hypoglycemic episodes or symptoms.  -Continue current therapy -F/u A1C at next visit.

## 2016-02-19 NOTE — Patient Instructions (Signed)
It was an absolute pleasure meeting you today!   1. Today we talked about your high blood pressure. I am so glad you bought a blood pressure meter to check it at home however the wrist cuffs are notoriously inaccurate and I believe that is contributing significantly to your recent high blood pressure readings. Please stop using this cuff, or if you do, be aware that readings may be falsely high.  2. I am consulting our social worker to get you a free blood pressure meter that goes on your arm. These are much more reliable. She will call you when she recieves one for you.  3. I am adding on a new blood pressure medication called Amlodipine 5 mg. This medication should cost you $10 at your pharmacy.  4. Please follow up again in the clinic to recheck your blood pressure in 1 month! 5. Have a great holiday season and please return to clinic if anything were to arise!

## 2016-02-19 NOTE — Progress Notes (Signed)
   CC: follow up of hypertension.  HPI:  Mitchell Page is a 60 y.o. male who presents to the clinic for BP check-up. He was seen in clinic in October and was found to be hypertensive. After some investigation, it was determined that the patient was taking Cetirizine instead of his prescribed antihypertensive Quinapril -HCTZ 10-12.5mg . Pt reports he cannot read english and he goes off of memory for what pill is what and when to take them. It was recommended that he speak to his pharmacist to explain the directions to him so that he may write the directions/name on the bottle in his native language.  Patient is concerned over several high blood pressure readings at home over the past 2 weeks. He reports he bought wrist cuff on Black Friday and since that time has noticed his blood pressures have been consistently elevated in the 170's-180's. He brought the cuff with him and demonstrated how he used it, reading was in the 170's. He endorses compliance with his home medications and states he's been on the same one for 10 years. He wants to know if he could have additional blood pressure control as each time he's presented to the clinic his blood pressure is "a little elevated." In regards to his type 2 diabetes, he reports his blood sugars have been consistently in 140's after eating and is quite pleased with his current regimen of Lantus 20 units, Metformin 2000 mg and Invokana 100 mg.   Past Medical History:  Diagnosis Date  . Allergic rhinitis   . Chronic headache   . Diabetes mellitus   . GERD (gastroesophageal reflux disease)   . Hypertension   . Illiteracy and low-level literacy    Unclear of his literacy  in his native tongue, however, he is unable to read in [place]  . Lower back pain    w/ R LE sciatica s/p laminectomy in 2008. MRI- 04/2008    Review of Systems:  Review of Systems  Constitutional: Negative for chills and fever.  Eyes: Negative for blurred vision and double vision.    Respiratory: Negative for cough and shortness of breath.   Cardiovascular: Negative for chest pain and leg swelling.  Gastrointestinal: Negative for abdominal pain, blood in stool and vomiting.  Neurological: Negative for dizziness, weakness and headaches.   Physical Exam: Physical Exam  Constitutional: He appears well-developed and well-nourished. No distress.  HENT:  Head: Normocephalic and atraumatic.  Cardiovascular: Normal rate, regular rhythm and normal heart sounds.   Pulmonary/Chest: Effort normal and breath sounds normal. No respiratory distress.  Abdominal: Soft. Bowel sounds are normal. He exhibits no distension.  Skin: Skin is warm and dry.  Psychiatric: He has a normal mood and affect. His behavior is normal. Judgment and thought content normal.   Vitals:   02/19/16 0915  BP: (!) 145/76  Pulse: 79  Temp: 97.7 F (36.5 C)  TempSrc: Oral  SpO2: 100%  Weight: 154 lb 6.4 oz (70 kg)  Height: 5\' 9"  (1.753 m)   Assessment & Plan:   See Encounters Tab for problem based charting.  Patient seen with Dr. Rogelia BogaButcher

## 2016-02-19 NOTE — Assessment & Plan Note (Addendum)
Pt was seen in clinic late October and was found to be hypertensive. Investigation revealed the patient had not been taking his medication as directed secondary to language barrier. This was rectified and patient now reports correct compliance with his medications. He bought a wrist cuff 2 weeks ago and has noticed high BP readings since that time (170's-180's). BP here in office above goal at 140's. I watched the patient use the BP cuff and he demonstrated correct technique and resulted in a high reading. I tried the cuff on myself and it returned "error" twice. I suspect his high blood pressure readings are secondary to the wrist cuff as these are notoriously unreliable.  -Encouraged patient to get an arm cuff, contacting SW to see if he might qualify for assistance to obtain one -Added Amlodipine 5 mg to current regimen of Quinapril-HCTZ 10-12.5.  -F/u 1 month

## 2016-02-20 NOTE — Progress Notes (Signed)
Internal Medicine Clinic Attending  I saw and evaluated the patient.  I personally confirmed the key portions of the history and exam documented by Dr. Molt and I reviewed pertinent patient test results.  The assessment, diagnosis, and plan were formulated together and I agree with the documentation in the resident's note. 

## 2016-02-26 ENCOUNTER — Other Ambulatory Visit: Payer: Self-pay | Admitting: Internal Medicine

## 2016-02-26 DIAGNOSIS — K219 Gastro-esophageal reflux disease without esophagitis: Secondary | ICD-10-CM

## 2016-02-29 ENCOUNTER — Other Ambulatory Visit: Payer: Self-pay

## 2016-02-29 DIAGNOSIS — E118 Type 2 diabetes mellitus with unspecified complications: Secondary | ICD-10-CM

## 2016-02-29 DIAGNOSIS — IMO0002 Reserved for concepts with insufficient information to code with codable children: Secondary | ICD-10-CM

## 2016-02-29 DIAGNOSIS — E1165 Type 2 diabetes mellitus with hyperglycemia: Principal | ICD-10-CM

## 2016-02-29 DIAGNOSIS — Z794 Long term (current) use of insulin: Principal | ICD-10-CM

## 2016-03-01 MED ORDER — GABAPENTIN 300 MG PO CAPS: 300.0000 mg | ORAL_CAPSULE | Freq: Three times a day (TID) | ORAL | 1 refills | Status: DC

## 2016-03-03 ENCOUNTER — Other Ambulatory Visit: Payer: Self-pay | Admitting: Internal Medicine

## 2016-03-03 DIAGNOSIS — K219 Gastro-esophageal reflux disease without esophagitis: Secondary | ICD-10-CM

## 2016-03-18 ENCOUNTER — Encounter: Payer: Self-pay | Admitting: Internal Medicine

## 2016-03-18 DIAGNOSIS — F1190 Opioid use, unspecified, uncomplicated: Secondary | ICD-10-CM | POA: Insufficient documentation

## 2016-03-19 ENCOUNTER — Encounter: Payer: Self-pay | Admitting: Internal Medicine

## 2016-03-19 ENCOUNTER — Ambulatory Visit (INDEPENDENT_AMBULATORY_CARE_PROVIDER_SITE_OTHER): Payer: Medicare Other | Admitting: Internal Medicine

## 2016-03-19 ENCOUNTER — Other Ambulatory Visit: Payer: Self-pay | Admitting: Internal Medicine

## 2016-03-19 VITALS — BP 122/70 | HR 79 | Temp 97.9°F | Ht 69.0 in | Wt 151.4 lb

## 2016-03-19 DIAGNOSIS — Z794 Long term (current) use of insulin: Secondary | ICD-10-CM

## 2016-03-19 DIAGNOSIS — E1165 Type 2 diabetes mellitus with hyperglycemia: Secondary | ICD-10-CM

## 2016-03-19 DIAGNOSIS — Z79899 Other long term (current) drug therapy: Secondary | ICD-10-CM | POA: Diagnosis not present

## 2016-03-19 DIAGNOSIS — I10 Essential (primary) hypertension: Secondary | ICD-10-CM | POA: Diagnosis not present

## 2016-03-19 DIAGNOSIS — IMO0002 Reserved for concepts with insufficient information to code with codable children: Secondary | ICD-10-CM

## 2016-03-19 DIAGNOSIS — M130 Polyarthritis, unspecified: Secondary | ICD-10-CM

## 2016-03-19 DIAGNOSIS — E118 Type 2 diabetes mellitus with unspecified complications: Secondary | ICD-10-CM

## 2016-03-19 DIAGNOSIS — R5383 Other fatigue: Secondary | ICD-10-CM | POA: Diagnosis not present

## 2016-03-19 MED ORDER — CANAGLIFLOZIN 100 MG PO TABS: 100.0000 mg | ORAL_TABLET | Freq: Every day | ORAL | 3 refills | Status: DC

## 2016-03-19 MED ORDER — IBUPROFEN 800 MG PO TABS: 800.0000 mg | ORAL_TABLET | Freq: Three times a day (TID) | ORAL | 0 refills | Status: DC | PRN

## 2016-03-19 NOTE — Patient Instructions (Addendum)
It was a pleasure to meet you Mitchell Page.  We are checking lab work for your joint pain/stiffness and fatigue. You can try Ibuprofen and/or Tylenol for your pain.   Take Ibuprofen 800 mg three times per day as needed. Limit Tylenol to less than 3000 mg daily.  Your blood pressure looks good today. I have refilled your Amlodipine.  Please follow up with your PCP or come see us sooner if needed.

## 2016-03-19 NOTE — Progress Notes (Signed)
   CC: Joint pain  HPI:  [name] is a 61 y.o. male with PMH as listed below who presents for management of polyarthropathy and HTN.  Acute Polyarthropathy: Patient reports acute onset of polyarthritis beginning 2 days ago involving his ankles, knees, elbows, wrists, fingers, shoulders, and neck. He describes the pain as a stiffness that occurs throughout the day. He feels fatigued and has had nausea without vomiting and flatulence since this began. He does take Tramadol for pain which has not provided much relief. He occasionally feels hot or cold but does not report fevers, chills, or diaphoresis. He reports similar episodes in the past that occurred when he would get "sick."  HTN: Patient takes Amlodipine 5 mg daily and Lisinopril-HCTZ 10-12.5 mg daily to which he reports adherence. BP today is 122/70.    Past Medical History:  Diagnosis Date  . Allergic rhinitis   . Chronic headache   . Diabetes mellitus   . GERD (gastroesophageal reflux disease)   . Hypertension   . Illiteracy and low-level literacy    Unclear of his literacy  in his native tongue, however, he is unable to read in [place]  . Lower back pain    w/ R LE sciatica s/p laminectomy in 2008. MRI- 04/2008    Review of Systems:   Review of Systems  Constitutional: Negative for chills, diaphoresis and fever.  Respiratory: Negative for shortness of breath.   Cardiovascular: Negative for chest pain.  Gastrointestinal: Positive for nausea. Negative for vomiting.  Musculoskeletal: Positive for joint pain. Negative for falls.       Diffuse joint stiffness without joint swelling or erythema.  Neurological: Negative for loss of consciousness.     Physical Exam:  Vitals:   03/19/16 1409  BP: 122/70  Pulse: 79  Temp: 97.9 F (36.6 C)  TempSrc: Oral  SpO2: 100%  Weight: 151 lb 6.4 oz (68.7 kg)  Height: 5\' 9"  (1.753 m)    Physical Exam  Constitutional: He is oriented to person, place, and time. He  appears well-developed and well-nourished. No distress.  Cardiovascular: Normal rate and regular rhythm.   Pulmonary/Chest: Effort normal. No respiratory distress. He has no wheezes.  Musculoskeletal: Normal range of motion. He exhibits no edema or tenderness.  Upper and lower extremity strength 5/5 bilaterally. No proximal muscle weakness. Joints of ankles, knees, wrists, fingers, elbows, and shoulders without obvious effusion, swelling, erythema, or increased warmth to touch.  Neurological: He is alert and oriented to person, place, and time.  Skin: Skin is warm. He is not diaphoretic. No erythema.   Bedside U/S: U/S of bilateral knees without obvious effusion or sign of synovitis. U/S of right ankle without obvious effusion or sign of synovitis.  Assessment & Plan:   See Encounters Tab for problem based charting.  Patient seen with Dr. Oswaldo DoneVincent

## 2016-03-19 NOTE — Assessment & Plan Note (Signed)
Patient reports acute onset of polyarthritis beginning 2 days ago involving his ankles, knees, elbows, wrists, fingers, shoulders, and neck. He describes the pain as a stiffness that occurs throughout the day. He feels fatigued and has had nausea without vomiting and flatulence since this began. He does take Tramadol for pain which has not provided much relief. He occasionally feels hot or cold but does not report fevers, chills, or diaphoresis. He reports similar episodes in the past that occurred when he would get "sick."  Bedside U/S: U/S of bilateral knees without obvious effusion or sign of synovitis. U/S of right ankle without obvious effusion or sign of synovitis.  Patient with acute polyarthropathy affecting most joints in a symmetrical distribution. Exam without obvious joint deformity or weakness. Unclear cause, although viral etiology is suspected. Doubt crystal disease as this would usually present in 1 or 2 joints with severe pain, swelling, and erythema.   Anticipate his symptoms will improve over the next 1-2 weeks. Will check CBC, LFTs, TSH, Hep C antibody, Hep B surface Ab/surface antigen/core based on acute polyarthritis workup algorithm. If symptoms persist for over 6 weeks, I would consider checking RF, anti-CCP, and ANA to evaluate for lupus and rheumatoid disease. Patient prescribed Ibuprofen 800 mg q8h prn 1 week supply for symptomatic control.

## 2016-03-19 NOTE — Assessment & Plan Note (Signed)
See polyarthropathy under problem list. Will check CBC and TSH to better assess his fatigue, arthropathy, and heat and cold intolerance symptoms.

## 2016-03-19 NOTE — Assessment & Plan Note (Signed)
Patient takes Amlodipine 5 mg daily and Lisinopril-HCTZ 10-12.5 mg daily to which he reports adherence. BP today is 122/70.  BP well-controlled. Will continue Lisinopril-HCTZ 10-12.5 mg daily and refill Amlodipine 5 mg daily.

## 2016-03-20 LAB — CBC WITH DIFFERENTIAL/PLATELET
BASOS: 0 %
Basophils Absolute: 0 10*3/uL (ref 0.0–0.2)
EOS (ABSOLUTE): 0.3 10*3/uL (ref 0.0–0.4)
Eos: 6 %
HEMATOCRIT: 43.4 % (ref 37.5–51.0)
Hemoglobin: 15 g/dL (ref 13.0–17.7)
Immature Grans (Abs): 0 10*3/uL (ref 0.0–0.1)
Immature Granulocytes: 0 %
LYMPHS ABS: 2 10*3/uL (ref 0.7–3.1)
Lymphs: 37 %
MCH: 28.2 pg (ref 26.6–33.0)
MCHC: 34.6 g/dL (ref 31.5–35.7)
MCV: 82 fL (ref 79–97)
MONOS ABS: 0.5 10*3/uL (ref 0.1–0.9)
Monocytes: 8 %
Neutrophils Absolute: 2.7 10*3/uL (ref 1.4–7.0)
Neutrophils: 49 %
Platelets: 185 10*3/uL (ref 150–379)
RBC: 5.32 x10E6/uL (ref 4.14–5.80)
RDW: 14.1 % (ref 12.3–15.4)
WBC: 5.6 10*3/uL (ref 3.4–10.8)

## 2016-03-20 LAB — HEPATITIS B CORE ANTIBODY, TOTAL: Hep B Core Total Ab: POSITIVE — AB

## 2016-03-20 LAB — HEPATITIS C ANTIBODY

## 2016-03-20 LAB — HEPATIC FUNCTION PANEL
ALT: 18 IU/L (ref 0–44)
AST: 16 IU/L (ref 0–40)
Albumin: 4.3 g/dL (ref 3.6–4.8)
Alkaline Phosphatase: 51 IU/L (ref 39–117)
BILIRUBIN, DIRECT: 0.24 mg/dL (ref 0.00–0.40)
Bilirubin Total: 0.7 mg/dL (ref 0.0–1.2)
Total Protein: 7.1 g/dL (ref 6.0–8.5)

## 2016-03-20 LAB — HEPATITIS B SURFACE ANTIBODY,QUALITATIVE: HEP B SURFACE AB, QUAL: REACTIVE

## 2016-03-20 LAB — TSH: TSH: 1.68 u[IU]/mL (ref 0.450–4.500)

## 2016-03-20 LAB — HEPATITIS B SURFACE ANTIGEN: HEP B S AG: NEGATIVE

## 2016-03-20 NOTE — Progress Notes (Signed)
Internal Medicine Clinic Attending  I saw and evaluated the patient.  I personally confirmed the key portions of the history and exam documented by Dr. Darreld McleanPatel,Vishal and I reviewed pertinent patient test results.  The assessment, diagnosis, and plan were formulated together and I agree with the documentation in the resident's note.  I was present for the POCUS of the knees and ankles. Minimal effusion and no doppler evidence of synovitis, consistent with physical exam. I agree with conservative treatment for now. If symptoms persist for more than six weeks we could check auto-antibodies to rule out RA and SLE.

## 2016-04-03 ENCOUNTER — Other Ambulatory Visit: Payer: Self-pay | Admitting: Internal Medicine

## 2016-04-03 DIAGNOSIS — M130 Polyarthritis, unspecified: Secondary | ICD-10-CM

## 2016-04-04 ENCOUNTER — Other Ambulatory Visit: Payer: Self-pay | Admitting: Internal Medicine

## 2016-04-04 DIAGNOSIS — M130 Polyarthritis, unspecified: Secondary | ICD-10-CM

## 2016-04-07 ENCOUNTER — Other Ambulatory Visit: Payer: Self-pay

## 2016-04-07 NOTE — Telephone Encounter (Signed)
Spoke with pt-he needs tramadol refill "ASAP"-will forward to pcp for review, please advise.Mitchell Page, Mitchell Camey Cassady1/22/20183:38 PM

## 2016-04-07 NOTE — Telephone Encounter (Signed)
traMADol (ULTRAM) 50 MG tablet, refill request @ [facility] [address]

## 2016-04-07 NOTE — Telephone Encounter (Signed)
Needs to speak with a nurse regarding pain med.  

## 2016-04-08 MED ORDER — TRAMADOL HCL 50 MG PO TABS: 50.0000 mg | ORAL_TABLET | Freq: Four times a day (QID) | ORAL | 0 refills | Status: DC | PRN

## 2016-04-08 NOTE — Telephone Encounter (Signed)
Called in tramadol to [facility] pharmacy. Dialed patient's phone to update him but no answer, mailbox full.

## 2016-04-08 NOTE — Telephone Encounter (Signed)
Call from patient requesting update on refill request.  Will f/u with pcp this afternoon.[name], Mitchell Cassady1/23/20189:27 AM

## 2016-04-15 ENCOUNTER — Other Ambulatory Visit: Payer: Self-pay | Admitting: Internal Medicine

## 2016-04-15 DIAGNOSIS — I10 Essential (primary) hypertension: Secondary | ICD-10-CM

## 2016-04-29 ENCOUNTER — Ambulatory Visit (INDEPENDENT_AMBULATORY_CARE_PROVIDER_SITE_OTHER): Payer: Medicare Other | Admitting: *Deleted

## 2016-04-29 DIAGNOSIS — Z23 Encounter for immunization: Secondary | ICD-10-CM

## 2016-05-05 ENCOUNTER — Other Ambulatory Visit: Payer: Self-pay

## 2016-05-05 DIAGNOSIS — J302 Other seasonal allergic rhinitis: Secondary | ICD-10-CM

## 2016-05-05 MED ORDER — CETIRIZINE HCL 10 MG PO TABS: 10.0000 mg | ORAL_TABLET | Freq: Every day | ORAL | 2 refills | Status: DC

## 2016-05-05 NOTE — Telephone Encounter (Signed)
cetirizine (ZYRTEC) 10 MG tablet, refill request @ [facility] [address]

## 2016-05-06 NOTE — Telephone Encounter (Signed)
[facility]

## 2016-05-09 ENCOUNTER — Telehealth: Payer: Self-pay | Admitting: Internal Medicine

## 2016-05-09 NOTE — Telephone Encounter (Signed)
APT. REMINDER CALL, NO ANSWER, NO VOICEMAIL °

## 2016-05-09 NOTE — Assessment & Plan Note (Addendum)
BP Readings from Last 3 Encounters:  05/12/16 127/78  03/19/16 122/70  02/19/16 (!) 145/76   Lab Results  Component Value Date   CREATININE 0.85 08/01/2015   Lab Results  Component Value Date   K 4.2 08/01/2015   Current medications: quinapril-HCTZ 10-12.5 mg daily, amlodipine 5 mg daily  Tolerated amlodipine well, no edema or syncope.  Assessment BP goal: 130/80 BP control: well controlled  Plan Medications: continue current meds Other:

## 2016-05-09 NOTE — Assessment & Plan Note (Addendum)
Seen in clinic 03/19/2016 with acute, symmetric polyarthritis involving most large and small appendicular and axial joints.  POC US of bilateral knees and R ankle did not reveal effusions.  Suspected viral arthritis, HBV and HCV infection ruled out, and given NSAIDs and anticipatory guidance.  A/P Symptoms have resolved.

## 2016-05-09 NOTE — Assessment & Plan Note (Addendum)
Lab Results  Component Value Date   HGBA1C 7.5 05/12/2016   HGBA1C 8.7 01/07/2016   HGBA1C 8.8 06/26/2015   CBG (last 3)   Recent Labs  05/12/16 1520  GLUCAP 178*    Current medications: metformin 1000 mg BID, canagliflozin 100 mg daily Current insulin: Lantus 20U QHS  Has been checking fasting AM BGs, usually 90s-110s, highest 120s.  He also sometimes checks after meals, sees numbers in 170s.  Occasional BGs 70-80s with dizziness, headache, most recently about 2 months ago, improved with orange juice. He brought his glucometer with him, but it is an old model without retrievable records in our clinic.  Assessment HgbA1c goal: <7.0% Glycemic control: above goal, improving Complications: neuropathy, NPDR w/ DME OU  Plan Medications: increase canagliflozin to 300 mg daily, continue metformin 1000 mg BID Insulin: continue Lantus 20U QHS Other: -last eye exam 07/2015 -A1c today -prescribe for new glucometer and strips

## 2016-05-09 NOTE — Progress Notes (Signed)
   CC: "I'm traveling to [place] and need refills for 3 months."  HPI:  [name] is a 61 y.o. man with history of DM2, HTN, and chronic musculoskeletal pain who presents for management of his diabetes.  Please see A&P for status of the patient's chronic medical conditions.  He is leaving for trip to [place] on Wed March 7, probably for 3-4 months.  Wants to make sure he has medicines for trip.  He is from the area, freuqently visits, and has family there.   Past Medical History:  Diagnosis Date  . Allergic rhinitis   . Chronic headache   . Diabetes mellitus   . GERD (gastroesophageal reflux disease)   . Hypertension   . Illiteracy and low-level literacy    Unclear of his literacy  in his native tongue, however, he is unable to read in [place]  . Lower back pain    w/ R LE sciatica s/p laminectomy in 2008. MRI- 04/2008   Review of Systems:  Review of Systems  Constitutional: Negative for chills and fever.  Respiratory: Negative for cough and shortness of breath.   Cardiovascular: Negative for chest pain and palpitations.  Gastrointestinal: Negative for constipation and diarrhea.  Genitourinary: Negative for dysuria and urgency.  Musculoskeletal: Positive for back pain and joint pain.   Physical Exam:  Vitals:   05/12/16 1523  BP: 127/78  Pulse: 98  Temp: 98.6 F (37 C)  TempSrc: Oral  SpO2: 99%  Weight: 150 lb 11.2 oz (68.4 kg)   Physical Exam  Constitutional: He is oriented to person, place, and time. He appears well-developed and well-nourished. No distress.  Cardiovascular: Normal rate and regular rhythm.   2/6 systolic murmur RUSB DP pulses 2+  Pulmonary/Chest: Effort normal and breath sounds normal.  Musculoskeletal: Normal range of motion.  Neurological: He is alert and oriented to person, place, and time.  Psychiatric: He has a normal mood and affect. His behavior is normal.     Assessment & Plan:   See Encounters Tab for problem  based charting.  Patient discussed with Dr. Cleda Daub

## 2016-05-12 ENCOUNTER — Ambulatory Visit (INDEPENDENT_AMBULATORY_CARE_PROVIDER_SITE_OTHER): Payer: Medicare Other | Admitting: Internal Medicine

## 2016-05-12 VITALS — BP 127/78 | HR 98 | Temp 98.6°F | Wt 150.7 lb

## 2016-05-12 DIAGNOSIS — F1120 Opioid dependence, uncomplicated: Secondary | ICD-10-CM

## 2016-05-12 DIAGNOSIS — G8921 Chronic pain due to trauma: Secondary | ICD-10-CM

## 2016-05-12 DIAGNOSIS — I10 Essential (primary) hypertension: Secondary | ICD-10-CM

## 2016-05-12 DIAGNOSIS — N529 Male erectile dysfunction, unspecified: Secondary | ICD-10-CM

## 2016-05-12 DIAGNOSIS — R51 Headache: Secondary | ICD-10-CM

## 2016-05-12 DIAGNOSIS — M545 Low back pain, unspecified: Secondary | ICD-10-CM

## 2016-05-12 DIAGNOSIS — K219 Gastro-esophageal reflux disease without esophagitis: Secondary | ICD-10-CM

## 2016-05-12 DIAGNOSIS — M79632 Pain in left forearm: Secondary | ICD-10-CM

## 2016-05-12 DIAGNOSIS — M130 Polyarthritis, unspecified: Secondary | ICD-10-CM | POA: Diagnosis not present

## 2016-05-12 DIAGNOSIS — Z794 Long term (current) use of insulin: Secondary | ICD-10-CM | POA: Diagnosis not present

## 2016-05-12 DIAGNOSIS — E1140 Type 2 diabetes mellitus with diabetic neuropathy, unspecified: Secondary | ICD-10-CM

## 2016-05-12 DIAGNOSIS — G43909 Migraine, unspecified, not intractable, without status migrainosus: Secondary | ICD-10-CM

## 2016-05-12 DIAGNOSIS — Z79899 Other long term (current) drug therapy: Secondary | ICD-10-CM | POA: Diagnosis not present

## 2016-05-12 DIAGNOSIS — R519 Headache, unspecified: Secondary | ICD-10-CM

## 2016-05-12 DIAGNOSIS — Z79891 Long term (current) use of opiate analgesic: Secondary | ICD-10-CM

## 2016-05-12 DIAGNOSIS — E113213 Type 2 diabetes mellitus with mild nonproliferative diabetic retinopathy with macular edema, bilateral: Secondary | ICD-10-CM

## 2016-05-12 DIAGNOSIS — E1165 Type 2 diabetes mellitus with hyperglycemia: Secondary | ICD-10-CM

## 2016-05-12 DIAGNOSIS — IMO0002 Reserved for concepts with insufficient information to code with codable children: Secondary | ICD-10-CM

## 2016-05-12 DIAGNOSIS — E118 Type 2 diabetes mellitus with unspecified complications: Principal | ICD-10-CM

## 2016-05-12 DIAGNOSIS — J302 Other seasonal allergic rhinitis: Secondary | ICD-10-CM

## 2016-05-12 DIAGNOSIS — G8929 Other chronic pain: Secondary | ICD-10-CM

## 2016-05-12 LAB — GLUCOSE, CAPILLARY: GLUCOSE-CAPILLARY: 178 mg/dL — AB (ref 65–99)

## 2016-05-12 LAB — POCT GLYCOSYLATED HEMOGLOBIN (HGB A1C): HEMOGLOBIN A1C: 7.5

## 2016-05-12 MED ORDER — GLUCOSE BLOOD VI STRP: ORAL_STRIP | 12 refills | Status: DC

## 2016-05-12 MED ORDER — GABAPENTIN 300 MG PO CAPS: 300.0000 mg | ORAL_CAPSULE | Freq: Three times a day (TID) | ORAL | 1 refills | Status: DC

## 2016-05-12 MED ORDER — INSULIN GLARGINE 100 UNIT/ML SOLOSTAR PEN: PEN_INJECTOR | SUBCUTANEOUS | 5 refills | Status: DC

## 2016-05-12 MED ORDER — TRAMADOL HCL 50 MG PO TABS
50.0000 mg | ORAL_TABLET | Freq: Four times a day (QID) | ORAL | 0 refills | Status: DC | PRN
Start: 2016-05-12 — End: 2016-07-22

## 2016-05-12 MED ORDER — ATORVASTATIN CALCIUM 40 MG PO TABS: 40.0000 mg | ORAL_TABLET | Freq: Every day | ORAL | 3 refills | Status: DC

## 2016-05-12 MED ORDER — METFORMIN HCL 1000 MG PO TABS: ORAL_TABLET | ORAL | 3 refills | Status: DC

## 2016-05-12 MED ORDER — CETIRIZINE HCL 10 MG PO TABS: 10.0000 mg | ORAL_TABLET | Freq: Every day | ORAL | 2 refills | Status: DC

## 2016-05-12 MED ORDER — CANAGLIFLOZIN 100 MG PO TABS: 100.0000 mg | ORAL_TABLET | Freq: Every day | ORAL | 3 refills | Status: DC

## 2016-05-12 MED ORDER — ACCU-CHEK FASTCLIX LANCETS MISC: 6 refills | Status: DC

## 2016-05-12 MED ORDER — OMEPRAZOLE 20 MG PO CPDR: DELAYED_RELEASE_CAPSULE | ORAL | 2 refills | Status: DC

## 2016-05-12 MED ORDER — INSULIN PEN NEEDLE 31G X 5 MM MISC: 0 refills | Status: DC

## 2016-05-12 MED ORDER — QUINAPRIL-HYDROCHLOROTHIAZIDE 10-12.5 MG PO TABS: ORAL_TABLET | ORAL | 3 refills | Status: DC

## 2016-05-12 MED ORDER — AMLODIPINE BESYLATE 5 MG PO TABS: 5.0000 mg | ORAL_TABLET | Freq: Every day | ORAL | 2 refills | Status: DC

## 2016-05-12 MED ORDER — SILDENAFIL CITRATE 20 MG PO TABS
ORAL_TABLET | ORAL | 0 refills | Status: DC
Start: 2016-05-12 — End: 2017-01-19

## 2016-05-12 MED ORDER — ACCU-CHEK GUIDE W/DEVICE KIT: 1.0000 | PACK | Freq: Every day | 0 refills | Status: DC

## 2016-05-12 MED ORDER — CANAGLIFLOZIN 100 MG PO TABS: 300.0000 mg | ORAL_TABLET | Freq: Every day | ORAL | 3 refills | Status: DC

## 2016-05-12 MED ORDER — BUTALBITAL-APAP-CAFFEINE 50-325-40 MG PO TABS: 1.0000 | ORAL_TABLET | Freq: Four times a day (QID) | ORAL | 0 refills | Status: DC | PRN

## 2016-05-12 NOTE — Assessment & Plan Note (Signed)
Rarely has headaches which are well controlled with half a pill of Fioricet.  -Refill fioricet

## 2016-05-12 NOTE — Assessment & Plan Note (Signed)
Has been taking Tramdol 50 mg once daily, which improves his pain and functioning.  Mostly for his chronic low back pain, but also his left forearm pain, both or which are post-traumatic.  Denies sedation and his alert and oriented today.  NCCSD reviewed today.  Received 6 Norco from dentist last month after tooth extraction, otherwise has been filling prescriptions for 120 Tramdol from our clinic every 3 months.  -[facility] -Pain contract deferred due to lack of English literacy -UDS next visit -Decrease prescription to 30 pills Tramadol/month if current once daily dosing continues

## 2016-05-12 NOTE — Patient Instructions (Addendum)
Your blood pressure and diabetes are doing well!  I have increased the dose of your Invokana, but otherwise no other changes.  Keep checking your blood sugar every morning.

## 2016-05-15 NOTE — Progress Notes (Signed)
Internal Medicine Clinic Attending  Case discussed with Dr. O'Sullivan at the time of the visit.  We reviewed the resident's history and exam and pertinent patient test results.  I agree with the assessment, diagnosis, and plan of care documented in the resident's note. 

## 2016-07-22 ENCOUNTER — Other Ambulatory Visit: Payer: Self-pay | Admitting: Internal Medicine

## 2016-07-22 DIAGNOSIS — M545 Low back pain, unspecified: Secondary | ICD-10-CM

## 2016-07-22 DIAGNOSIS — G8929 Other chronic pain: Secondary | ICD-10-CM

## 2016-07-24 NOTE — Telephone Encounter (Signed)
Tramadol called in to [facility].

## 2016-08-22 ENCOUNTER — Encounter: Payer: Self-pay | Admitting: *Deleted

## 2016-08-25 ENCOUNTER — Encounter: Payer: Self-pay | Admitting: Internal Medicine

## 2016-09-02 ENCOUNTER — Other Ambulatory Visit: Payer: Self-pay | Admitting: Internal Medicine

## 2016-09-02 DIAGNOSIS — M545 Low back pain, unspecified: Secondary | ICD-10-CM

## 2016-09-02 DIAGNOSIS — G8929 Other chronic pain: Secondary | ICD-10-CM

## 2016-09-03 NOTE — Telephone Encounter (Signed)
Dr. Peggyann Juba'Sullivan has no openings and he will be leaving the program end of this month.  Please refer to letter dated on 08/22/16.  Instructs patient to call for an appointment with new primary care provider.

## 2016-09-05 NOTE — Telephone Encounter (Signed)
Needs appointment for further refills.  Print script in med room.

## 2016-09-08 ENCOUNTER — Other Ambulatory Visit: Payer: Self-pay | Admitting: Internal Medicine

## 2016-09-08 DIAGNOSIS — G8929 Other chronic pain: Secondary | ICD-10-CM

## 2016-09-08 DIAGNOSIS — M545 Low back pain: Principal | ICD-10-CM

## 2016-09-09 NOTE — Telephone Encounter (Signed)
Tramadol rx faxed to [facility] pharmacy. 

## 2016-10-06 ENCOUNTER — Encounter: Payer: Self-pay | Admitting: Internal Medicine

## 2016-10-06 ENCOUNTER — Encounter: Payer: Medicare Other | Admitting: Internal Medicine

## 2016-12-03 ENCOUNTER — Ambulatory Visit (INDEPENDENT_AMBULATORY_CARE_PROVIDER_SITE_OTHER): Payer: Medicare Other | Admitting: Internal Medicine

## 2016-12-03 ENCOUNTER — Encounter: Payer: Self-pay | Admitting: Dietician

## 2016-12-03 VITALS — BP 134/71 | HR 66 | Temp 98.2°F | Ht 69.0 in | Wt 151.0 lb

## 2016-12-03 DIAGNOSIS — Z9889 Other specified postprocedural states: Secondary | ICD-10-CM | POA: Diagnosis not present

## 2016-12-03 DIAGNOSIS — Z79899 Other long term (current) drug therapy: Secondary | ICD-10-CM

## 2016-12-03 DIAGNOSIS — IMO0002 Reserved for concepts with insufficient information to code with codable children: Secondary | ICD-10-CM

## 2016-12-03 DIAGNOSIS — Z23 Encounter for immunization: Secondary | ICD-10-CM | POA: Diagnosis present

## 2016-12-03 DIAGNOSIS — Z794 Long term (current) use of insulin: Secondary | ICD-10-CM | POA: Diagnosis not present

## 2016-12-03 DIAGNOSIS — E1165 Type 2 diabetes mellitus with hyperglycemia: Secondary | ICD-10-CM

## 2016-12-03 DIAGNOSIS — R51 Headache: Secondary | ICD-10-CM | POA: Diagnosis not present

## 2016-12-03 DIAGNOSIS — I10 Essential (primary) hypertension: Secondary | ICD-10-CM | POA: Diagnosis not present

## 2016-12-03 DIAGNOSIS — E119 Type 2 diabetes mellitus without complications: Secondary | ICD-10-CM

## 2016-12-03 DIAGNOSIS — G4700 Insomnia, unspecified: Secondary | ICD-10-CM

## 2016-12-03 DIAGNOSIS — M545 Low back pain, unspecified: Secondary | ICD-10-CM

## 2016-12-03 DIAGNOSIS — Z79891 Long term (current) use of opiate analgesic: Secondary | ICD-10-CM

## 2016-12-03 DIAGNOSIS — G4709 Other insomnia: Secondary | ICD-10-CM

## 2016-12-03 DIAGNOSIS — R011 Cardiac murmur, unspecified: Secondary | ICD-10-CM

## 2016-12-03 DIAGNOSIS — G8929 Other chronic pain: Secondary | ICD-10-CM

## 2016-12-03 DIAGNOSIS — R519 Headache, unspecified: Secondary | ICD-10-CM

## 2016-12-03 DIAGNOSIS — E118 Type 2 diabetes mellitus with unspecified complications: Principal | ICD-10-CM

## 2016-12-03 LAB — POCT GLYCOSYLATED HEMOGLOBIN (HGB A1C): Hemoglobin A1C: 8.1

## 2016-12-03 LAB — GLUCOSE, CAPILLARY: Glucose-Capillary: 111 mg/dL — ABNORMAL HIGH (ref 65–99)

## 2016-12-03 MED ORDER — TRAMADOL HCL 50 MG PO TABS: 50.0000 mg | ORAL_TABLET | Freq: Four times a day (QID) | ORAL | 0 refills | Status: DC | PRN

## 2016-12-03 MED ORDER — INSULIN PEN NEEDLE 31G X 5 MM MISC: 0 refills | Status: DC

## 2016-12-03 MED ORDER — BUTALBITAL-APAP-CAFFEINE 50-325-40 MG PO TABS: 1.0000 | ORAL_TABLET | Freq: Four times a day (QID) | ORAL | 0 refills | Status: DC | PRN

## 2016-12-03 MED ORDER — INSULIN GLARGINE 100 UNIT/ML SOLOSTAR PEN: PEN_INJECTOR | SUBCUTANEOUS | 5 refills | Status: DC

## 2016-12-03 NOTE — Assessment & Plan Note (Signed)
Patient just returned from [place] close to 1 week ago.  Since then he has had a great difficulty in falling asleep and staying asleep.  We counseled the patient that this is likely due to the change in time zones.  I gave specific discharge instructions and is after visit summary detailing where to find melatonin and how much and when to take this to help him with his sleep.    -Over-the-counter melatonin 3 mg or greater dosage one pill, 30 minutes before bed.

## 2016-12-03 NOTE — Progress Notes (Signed)
   CC: low back pain, insomnia  HPI:  [name] is a 61 y.o. who comes in primarily for low back pain requesting a refill of his tramadol.  He also wants to evaluate his diabetes and hypertension and his insomnia resulting from his  recent return from [place] in the last week.    Please see A&P for status of the patient's chronic medical conditions  Past Medical History:  Diagnosis Date  . Allergic rhinitis   . Chronic headache   . Diabetes mellitus   . GERD (gastroesophageal reflux disease)   . Hypertension   . Illiteracy and low-level literacy    Unclear of his literacy  in his native tongue, however, he is unable to read in [place]  . Lower back pain    w/ R LE sciatica s/p laminectomy in 2008. MRI- 04/2008   Review of Systems:  ROS: Pulmonary: pt denies increased work of breathing, shortness of breath,  Cardiac: pt denies palpitations, chest pain,  Abdominal: pt denies abdominal pain, nausea, vomiting, or diarrhea  Physical Exam:  Vitals:   12/03/16 1449  BP: 134/71  Pulse: 66  Temp: 98.2 F (36.8 C)  TempSrc: Oral  SpO2: 98%  Weight: 151 lb (68.5 kg)  Height:  (1.753 m)   Physical Exam  Constitutional: He is oriented to person, place, and time. He appears well-developed and well-nourished.  Eyes: Right eye exhibits no discharge. Left eye exhibits no discharge.  Neck: No JVD present. No thyromegaly present.  Cardiovascular: Normal rate and regular rhythm.   Murmur (RUSB grade 2 systolic) heard. Pulmonary/Chest: Effort normal and breath sounds normal.  Abdominal: Soft. Bowel sounds are normal.  Musculoskeletal: He exhibits no edema or deformity.  Neurological: He is alert and oriented to person, place, and time.  Skin: Skin is warm and dry.  Psychiatric: He has a normal mood and affect. His behavior is normal.    Social History   Social History  . Marital status: Married    Spouse name: N/A  . Number of children: N/A  . Years of education: N/A    Occupational History  . Not on file.   Social History Main Topics  . Smoking status: Never Smoker  . Smokeless tobacco: Not on file  . Alcohol use No  . Drug use: No  . Sexual activity: Not on file   Other Topics Concern  . Not on file   Social History Narrative   Immigrated from [place] in 1990.   Married, has 3 children.   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   Uninsured.   Highest education - middle school.   No smoking, alcohol or illicit drug use.      Cannot read in [place].    No family history on file.  Assessment & Plan:   See Encounters Tab for problem based charting.  Patient seen with Dr. Heide Spark

## 2016-12-03 NOTE — Patient Instructions (Signed)
Please purchase some over the counter melatonin when you pick up your other medications you can buy the  or higher if available and take one pill about 30 minutes before you want to go to sleep.  This should help reset your internal clock and help with your sleep.  I'ts important to check your blood sugars more frequently as we discussed, the diabetes educator will help you with your meter and when and how to check them.

## 2016-12-03 NOTE — Assessment & Plan Note (Addendum)
Patient's last 2 blood pressure readings during the beginning of the year were around 125/80.  His blood pressure today is 134/71.  He is currently taking quinapril hydrochlorothiazide 10 / 12.5 And amlodipine 5 mg  He reports no issues with these medications.  -continue regimen as described above.

## 2016-12-03 NOTE — Assessment & Plan Note (Signed)
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  He has been weaned down to around 1 tramadol 50 mg per day and is able to ambulate and perform ADLs without issue on this dosage.  Today we established a new pain contract.  We will continue the 1 tramadol per day for now with the hopes that in the future we'll be able to cut back.  -Prescribed tramadol 50 mg daily for one month.

## 2016-12-03 NOTE — Assessment & Plan Note (Signed)
It seems the patient has had a chronic history of headaches it doesn't seem that these headaches are migraines however he has been treated in the past with Fioricet.  He requests a refill today he says one refill of about a 30 day supply will last him 8 months.  I explained that in the future I would like to change this medication and reevaluate his headaches.  That I would refill it for today but warned him not to take this medication every day as patient's  become dependent on this and will have headaches if they do not take it.  He understood and assured me that this would not be a problem.

## 2016-12-03 NOTE — Assessment & Plan Note (Signed)
We talked in depth about the patient's diabetes today.His hemoglobin A1c is slightly elevated from his last visit and is now at 8.1.  Unfortunately although he brought his meter it had around 5 random blood glucose measurements for the entire 2 months.  I explained it would be difficult to change his medications around given these very few random measurements and he understood.  He has had some problems with his meter and been getting air messages.  Lupita Leash our diabetes educator agreed to help him with his meter directly after this visit.  He agreed to take his blood glucose measurements around 3 times per day as we discussed to help [place] adjust. He is currently taking 20 units of Lantus daily,metformin 1000 mg twice a day, and Invokana 100 mg daily.  -patient agrees to take more measurements with his meter and come back in one month for a reevaluation.

## 2016-12-08 NOTE — Progress Notes (Signed)
Internal Medicine Clinic Attending  I saw and evaluated the patient.  I personally confirmed the key portions of the history and exam documented by Dr. Winfrey and I reviewed pertinent patient test results.  The assessment, diagnosis, and plan were formulated together and I agree with the documentation in the resident's note. 

## 2016-12-16 ENCOUNTER — Ambulatory Visit (INDEPENDENT_AMBULATORY_CARE_PROVIDER_SITE_OTHER): Payer: Medicare Other | Admitting: Internal Medicine

## 2016-12-16 VITALS — BP 143/83 | HR 80 | Temp 98.0°F | Ht 69.0 in | Wt 149.9 lb

## 2016-12-16 DIAGNOSIS — M7918 Myalgia, other site: Secondary | ICD-10-CM

## 2016-12-16 DIAGNOSIS — G8929 Other chronic pain: Secondary | ICD-10-CM | POA: Diagnosis not present

## 2016-12-16 DIAGNOSIS — E119 Type 2 diabetes mellitus without complications: Secondary | ICD-10-CM | POA: Diagnosis not present

## 2016-12-16 DIAGNOSIS — I10 Essential (primary) hypertension: Secondary | ICD-10-CM

## 2016-12-16 DIAGNOSIS — M791 Myalgia, unspecified site: Secondary | ICD-10-CM | POA: Insufficient documentation

## 2016-12-16 MED ORDER — IBUPROFEN 800 MG PO TABS: 800.0000 mg | ORAL_TABLET | Freq: Three times a day (TID) | ORAL | 0 refills | Status: DC | PRN

## 2016-12-16 NOTE — Patient Instructions (Addendum)
It was a pleasure to see you Mitchell Page.  I am sorry you are not feeling well.  It sounds like your pain is from muscle inflammation.  We are checking blood work to see if we can find the cause for your symptoms.  We will start Ibuprofen 800 mg three times a day as needed to help with your pain and inflammation.  Please drink plenty of water to stay hydrated.  I will call you with your lab results.  Please follow up with Dr. Frances Furbish on 01/19/17 or see [place] sooner if your symptoms are not improving.

## 2016-12-16 NOTE — Progress Notes (Signed)
   CC: Muscle aches  HPI:  [name] is a 61 y.o. male with PMH as listed below including HTN, T2DM, chronic back pain who presents for management of muscle aches.  Patient reports 2 days of squeezing type muscle pain involving his lower back, buttocks, and legs. He says this is different than his chronic low back pain or his neuropathic pain. He has been taking his usual Tramadol and Gabapentin without relief. He has also tried Tylenol 500 mg without relief. He denies any recent illness or sick contacts. He denies any heavy lifting or injury. He denies any fevers, chills, weakness of his muscles, abdominal pain, bowel/bladder incontinence, constipation, diarrhea, or rash.  I saw him in January 2018 for a similar episode which at that time was described more as a polyarthritis picture. 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  Past Medical History:  Diagnosis Date  . Allergic rhinitis   . Chronic headache   . Diabetes mellitus   . GERD (gastroesophageal reflux disease)   . Hypertension   . Illiteracy and low-level literacy    Unclear of his literacy  in his native tongue, however, he is unable to read in [place]  . Lower back pain    w/ R LE sciatica s/p laminectomy in 2008. MRI- 04/2008   Review of Systems:   Review of Systems  Constitutional: Negative for chills and fever.  Respiratory: Negative for shortness of breath.   Cardiovascular: Negative for chest pain.  Gastrointestinal: Negative for abdominal pain, constipation and diarrhea.  Genitourinary: Negative for dysuria.  Musculoskeletal: Positive for back pain and myalgias. Negative for falls and joint pain.  Skin: Negative for rash.  Neurological: Negative for tingling.     Physical Exam:  Vitals:   12/16/16 1534  BP: (!) 143/83  Pulse: 80  Temp: 98 F (36.7 C)  TempSrc: Oral   SpO2: 100%  Weight: 149 lb 14.4 oz (68 kg)  Height: '5\' 9"'$  (1.753 m)   Physical Exam  Constitutional: He is oriented to person, place, and time. He appears well-developed and well-nourished. No distress.  HENT:  Head: Normocephalic and atraumatic.  Cardiovascular: Normal rate and regular rhythm.   Pulmonary/Chest: Effort normal. No respiratory distress. He has no wheezes. He has no rales.  Musculoskeletal: Normal range of motion. He exhibits no tenderness.  Strength 5/5 all extremities including proximal should and hip flexors. No joint abnormality of hands or knees.   Neurological: He is alert and oriented to person, place, and time.  Skin: Skin is warm. No rash noted. He is not diaphoretic.  Psychiatric: He has a normal mood and affect.    Assessment & Plan:   See Encounters Tab for problem based charting.  Patient discussed with Dr. 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 He has normal strength throughout his extremities without rash or join arthropathy on my exam. Would consider statin induced myopathy, hypokalemia, or polymyalgia rheumatica in the differential at this time. - Check BMET, CK, ESR, CRP - Start Ibuprofen 800 mg TID prn for pain and inflammation - follow up with PCP on 01/19/17 or sooner if no improvement

## 2016-12-16 NOTE — Assessment & Plan Note (Addendum)
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 He has normal strength throughout his extremities without rash or join arthropathy on my exam. Would consider statin induced myopathy, hypokalemia, or polymyalgia rheumatica in the differential at this time. - Check BMET, CK, ESR, CRP - If CK is up, hold statin and recheck after off statin - If ESR and/or CRP up, would consider polymyalgia rheumatica higher in differential - Start Ibuprofen 800 mg TID prn for pain and inflammation - follow up with PCP on 01/19/17 or sooner if no improvement

## 2016-12-17 LAB — C-REACTIVE PROTEIN: CRP: 0.4 mg/L (ref 0.0–4.9)

## 2016-12-17 LAB — SEDIMENTATION RATE: SED RATE: 4 mm/h (ref 0–30)

## 2016-12-17 NOTE — Progress Notes (Signed)
Internal Medicine Clinic Attending  Case discussed with Dr. Patel at the time of the visit.  We reviewed the resident's history and exam and pertinent patient test results.  I agree with the assessment, diagnosis, and plan of care documented in the resident's note.  

## 2016-12-18 LAB — CK: CK TOTAL: 96 U/L (ref 24–204)

## 2016-12-18 LAB — BMP8+ANION GAP
Anion Gap: 17 mmol/L (ref 10.0–18.0)
BUN / CREAT RATIO: 19 (ref 10–24)
BUN: 16 mg/dL (ref 8–27)
CO2: 20 mmol/L (ref 20–29)
CREATININE: 0.86 mg/dL (ref 0.76–1.27)
Calcium: 10.1 mg/dL (ref 8.6–10.2)
Chloride: 103 mmol/L (ref 96–106)
GFR calc Af Amer: 108 mL/min/{1.73_m2} (ref >59)
GFR, EST NON AFRICAN AMERICAN: 94 mL/min/{1.73_m2} (ref >59)
Glucose: 124 mg/dL — ABNORMAL HIGH (ref 65–99)
Potassium: 4.1 mmol/L (ref 3.5–5.2)
Sodium: 140 mmol/L (ref 134–144)

## 2017-01-02 ENCOUNTER — Encounter: Payer: Self-pay | Admitting: *Deleted

## 2017-01-02 NOTE — Progress Notes (Signed)
On January 01, 2017 at approximately 4 P, Mr Magdalene Rivernnusah requested to see the [facility] to complain about the [facility] Financial Counsel[facility]. I listened to the pt's account of how he gave his wife all the correct documents to submit for the renewal of his financial aid application, just as he has done for years. He asked why the Financial Counselor was "so mean when she has known us all these years and now she refuses to help us and tells the other 'orange card" clinics so now they won't help us."  I repeatedly explained that the Financial Counselor operated under the [employer] financial assistance policy and [facility] policy r/t financial aid and that she personally did not do anything to him or his family or make any personal decisions, that health system and county financial criteria were the determining factors in his losing financial assistance. I acknowledged his frustration and stated I heard his concerns at multiple points within the conversation.After about 20 minutes of discussion, the pt stood up and pointed to the ceiling, saying "you do not know what this decision will do because I swear to God I will tell 100 people about you". I told him I was glad to explain the regulations and the processes and to listen to his frustration, but what he was saying sounded more like a threat at that point and I was not willing to continue the conversation if he chose to threaten the clinic in any way, at which point he left the clinic. Dorie RankSharon Remer Couse, RN, Director, Internal Medicine Center, late chart October 19,2018, 1:57 P

## 2017-01-15 ENCOUNTER — Other Ambulatory Visit: Payer: Self-pay | Admitting: Internal Medicine

## 2017-01-15 DIAGNOSIS — M791 Myalgia, unspecified site: Secondary | ICD-10-CM

## 2017-01-19 ENCOUNTER — Encounter (INDEPENDENT_AMBULATORY_CARE_PROVIDER_SITE_OTHER): Payer: Self-pay

## 2017-01-19 ENCOUNTER — Ambulatory Visit (INDEPENDENT_AMBULATORY_CARE_PROVIDER_SITE_OTHER): Payer: Medicare Other | Admitting: Internal Medicine

## 2017-01-19 VITALS — BP 135/82 | HR 79 | Temp 97.7°F | Ht 69.0 in | Wt 151.3 lb

## 2017-01-19 DIAGNOSIS — G8929 Other chronic pain: Secondary | ICD-10-CM

## 2017-01-19 DIAGNOSIS — E1169 Type 2 diabetes mellitus with other specified complication: Secondary | ICD-10-CM | POA: Diagnosis present

## 2017-01-19 DIAGNOSIS — R3912 Poor urinary stream: Secondary | ICD-10-CM | POA: Diagnosis not present

## 2017-01-19 DIAGNOSIS — M545 Low back pain, unspecified: Secondary | ICD-10-CM

## 2017-01-19 DIAGNOSIS — Z79891 Long term (current) use of opiate analgesic: Secondary | ICD-10-CM

## 2017-01-19 DIAGNOSIS — R3914 Feeling of incomplete bladder emptying: Secondary | ICD-10-CM

## 2017-01-19 DIAGNOSIS — Z794 Long term (current) use of insulin: Secondary | ICD-10-CM | POA: Diagnosis not present

## 2017-01-19 DIAGNOSIS — R351 Nocturia: Secondary | ICD-10-CM | POA: Diagnosis not present

## 2017-01-19 DIAGNOSIS — N400 Enlarged prostate without lower urinary tract symptoms: Secondary | ICD-10-CM | POA: Insufficient documentation

## 2017-01-19 DIAGNOSIS — N529 Male erectile dysfunction, unspecified: Secondary | ICD-10-CM

## 2017-01-19 DIAGNOSIS — E1165 Type 2 diabetes mellitus with hyperglycemia: Secondary | ICD-10-CM

## 2017-01-19 DIAGNOSIS — N521 Erectile dysfunction due to diseases classified elsewhere: Secondary | ICD-10-CM

## 2017-01-19 DIAGNOSIS — N401 Enlarged prostate with lower urinary tract symptoms: Secondary | ICD-10-CM

## 2017-01-19 DIAGNOSIS — M5126 Other intervertebral disc displacement, lumbar region: Secondary | ICD-10-CM

## 2017-01-19 DIAGNOSIS — Z79899 Other long term (current) drug therapy: Secondary | ICD-10-CM

## 2017-01-19 DIAGNOSIS — I10 Essential (primary) hypertension: Secondary | ICD-10-CM

## 2017-01-19 DIAGNOSIS — IMO0002 Reserved for concepts with insufficient information to code with codable children: Secondary | ICD-10-CM

## 2017-01-19 DIAGNOSIS — E118 Type 2 diabetes mellitus with unspecified complications: Secondary | ICD-10-CM

## 2017-01-19 MED ORDER — TRAMADOL HCL 50 MG PO TABS: 50.0000 mg | ORAL_TABLET | Freq: Four times a day (QID) | ORAL | 0 refills | Status: DC | PRN

## 2017-01-19 MED ORDER — GABAPENTIN 300 MG PO CAPS: 300.0000 mg | ORAL_CAPSULE | Freq: Three times a day (TID) | ORAL | 1 refills | Status: DC

## 2017-01-19 MED ORDER — METFORMIN HCL 1000 MG PO TABS: ORAL_TABLET | ORAL | 3 refills | Status: DC

## 2017-01-19 MED ORDER — TAMSULOSIN HCL 0.4 MG PO CAPS: 0.4000 mg | ORAL_CAPSULE | Freq: Every day | ORAL | 2 refills | Status: DC

## 2017-01-19 MED ORDER — INSULIN GLARGINE 100 UNIT/ML SOLOSTAR PEN: PEN_INJECTOR | SUBCUTANEOUS | 5 refills | Status: DC

## 2017-01-19 MED ORDER — SILDENAFIL CITRATE 20 MG PO TABS: ORAL_TABLET | ORAL | 2 refills | Status: DC

## 2017-01-19 MED ORDER — QUINAPRIL HCL 10 MG PO TABS: 10.0000 mg | ORAL_TABLET | Freq: Every day | ORAL | 3 refills | Status: DC

## 2017-01-19 NOTE — Progress Notes (Signed)
CC: difficulty voiding, Erectile Dysfunction, follow up on T2DM, HTN  HPI:  Mitchell Page is a 61 y.o. male here to talk about his difficulty with urination and erectile dysfunction.  He reports he takes what feels like 3 minutes to void completely.  He also reports having difficulty getting an erection and this is causing a problem in his marriage.  He was good enough to bring his blood glucose meter which showed blood glucose averaging about 200. He admits that the african diet is not conducive to having diabetes and that he will try to do better.    Please see A&P for status of the patient's chronic medical conditions  Past Medical History:  Diagnosis Date  . Allergic rhinitis   . Chronic headache   . Diabetes mellitus   . GERD (gastroesophageal reflux disease)   . Hypertension   . Illiteracy and low-level literacy    Unclear of his literacy  in his native tongue, however, he is unable to read in [place]  . Lower back pain    w/ R LE sciatica s/p laminectomy in 2008. MRI- 04/2008   Review of Systems:  ROS: Pulmonary: pt denies increased work of breathing, shortness of breath,  Cardiac: pt denies palpitations, chest pain,  Abdominal: pt denies abdominal pain, nausea, vomiting, or diarrhea  Physical Exam:  Vitals:   01/19/17 1543  BP: 135/82  Pulse: 79  Temp: 97.7 F (36.5 C)  TempSrc: Oral  SpO2: 100%  Weight: 151 lb 4.8 oz (68.6 kg)  Height: 5\' 9"  (1.753 m)   Physical Exam  Constitutional: He is oriented to person, place, and time. He appears well-developed and well-nourished.  Eyes: Right eye exhibits no discharge. Left eye exhibits no discharge. No scleral icterus.  Cardiovascular: Normal rate, regular rhythm, normal heart sounds and intact distal pulses. Exam reveals no gallop and no friction rub.  No murmur heard. Pulmonary/Chest: Effort normal and breath sounds normal. No respiratory distress. He has no wheezes. He has no rales.  Abdominal: Soft. Bowel  sounds are normal. He exhibits no distension and no mass. There is no tenderness. There is no guarding.  Genitourinary: Prostate is enlarged (symmetrical no nodularity). Prostate is not tender.  Neurological: He is alert and oriented to person, place, and time.    Social History   Socioeconomic History  . Marital status: Married    Spouse name: Not on file  . Number of children: Not on file  . Years of education: Not on file  . Highest education level: Not on file  Social Needs  . Financial resource strain: Not on file  . Food insecurity - worry: Not on file  . Food insecurity - inability: Not on file  . Transportation needs - medical: Not on file  . Transportation needs - non-medical: Not on file  Occupational History  . Not on file  Tobacco Use  . Smoking status: Never Smoker  Substance and Sexual Activity  . Alcohol use: No    Alcohol/week: 0.0 oz  . Drug use: No  . Sexual activity: Not on file  Other Topics Concern  . Not on file  Social History Narrative   Immigrated from [place] in 1990.   Married, has 3 children.   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   Uninsured.   Highest education - middle school.   No smoking, alcohol or illicit drug use.  Cannot read in [place].    No family history on file.  Assessment & Plan:   See Encounters Tab for problem based charting.  Patient seen with Dr. Josem KaufmannKlima

## 2017-01-19 NOTE — Patient Instructions (Signed)
We have started you on a medication to help with your urination, we also checked a lab to help us screen for prostate cancer.  Your A1C was elevated so we changed you from 20 units of lantus daily to 24 units.  Your blood pressure has been good so we changed your quinapril-HCTZ to just quinapril 10mg  for blood pressure to help keep you from having to urinate so often. Your urinalysis was neg for signs of infection today.  I have also written a prescription for sildenafil as we discussed.

## 2017-01-20 ENCOUNTER — Encounter: Payer: Self-pay | Admitting: Internal Medicine

## 2017-01-20 LAB — PSA: PROSTATE SPECIFIC AG, SERUM: 1.3 ng/mL (ref 0.0–4.0)

## 2017-01-20 NOTE — Assessment & Plan Note (Signed)
BP Readings from Last 3 Encounters:  01/19/17 135/82  12/16/16 (!) 143/83  12/03/16 134/71   Blood pressure is within goal today.  Will d/c hctz due to urinary symptoms and keep quinapril 10mg , and amlodipine 5mg .    -continue regimen as listed above

## 2017-01-20 NOTE — Assessment & Plan Note (Addendum)
Pt states that this difficulty maintaining a urinary stream and around 1-2 episodes of nocturia per night has been going on for years now and he never thought to mention it.  He feels that it has continued to get worse and spends an extra minute or two urinating and it is difficult to urinate.  He also reports some burning with urination.    -checked urine dip stick was neg for: leukocytes, nitrites, protein, RBC's -Prostate exam positive for symmetrically enlarged prostate without nodularity -ordered PSA for screening -will start with tamsulosin 0.4mg  daily for now

## 2017-01-20 NOTE — Assessment & Plan Note (Signed)
Pt continues to have difficulty getting and maintaining an erection.  He reports his last refill that with 40mg  of sildenafil he reported little success.  He says this is affecting his marriage and asks for help.  -Will try 80mg  sildenafil before sex no more than once daily

## 2017-01-20 NOTE — Assessment & Plan Note (Signed)
Lab Results  Component Value Date   HGBA1C 8.1 12/03/2016   Pt's A1C up to 8.1 today in talking with pt he attributes this to his diet.  He has met with the diabetic counselor in the past and understands what he should eat.  He is on metformin 2020m daily, invokana 3085mdaily and lantus 20 units daily.    -will increase lantus to 24 units daily

## 2017-01-20 NOTE — Assessment & Plan Note (Signed)
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  Doing well with this able to get up and walk around well.   -continue for now

## 2017-01-21 DIAGNOSIS — E113312 Type 2 diabetes mellitus with moderate nonproliferative diabetic retinopathy with macular edema, left eye: Secondary | ICD-10-CM | POA: Diagnosis not present

## 2017-01-21 DIAGNOSIS — E113291 Type 2 diabetes mellitus with mild nonproliferative diabetic retinopathy without macular edema, right eye: Secondary | ICD-10-CM | POA: Diagnosis not present

## 2017-01-23 NOTE — Progress Notes (Signed)
I saw and evaluated the patient.  I personally confirmed the key portions of Dr. Winfrey's history and exam and reviewed pertinent patient test results.  The assessment, diagnosis, and plan were formulated together and I agree with the documentation in the resident's note. 

## 2017-02-13 DIAGNOSIS — E113312 Type 2 diabetes mellitus with moderate nonproliferative diabetic retinopathy with macular edema, left eye: Secondary | ICD-10-CM | POA: Diagnosis not present

## 2017-02-16 ENCOUNTER — Other Ambulatory Visit: Payer: Self-pay | Admitting: Internal Medicine

## 2017-02-16 DIAGNOSIS — G8929 Other chronic pain: Secondary | ICD-10-CM

## 2017-02-16 DIAGNOSIS — M545 Low back pain: Principal | ICD-10-CM

## 2017-02-18 NOTE — Telephone Encounter (Signed)
Pt is calling back for Tramadol to be filled, states he is in pain. Would like this med by today. Please call back.

## 2017-02-18 NOTE — Telephone Encounter (Signed)
filled

## 2017-02-18 NOTE — Telephone Encounter (Signed)
traMADol (ULTRAM) 50 MG tablet, Refill request @ [facility] [address]. Per patient he is out of meds, and in pain. The pharmacy is still waiting for the office to reply back.

## 2017-03-01 ENCOUNTER — Other Ambulatory Visit: Payer: Self-pay | Admitting: Internal Medicine

## 2017-03-01 DIAGNOSIS — R519 Headache, unspecified: Secondary | ICD-10-CM

## 2017-03-01 DIAGNOSIS — R51 Headache: Principal | ICD-10-CM

## 2017-03-05 ENCOUNTER — Other Ambulatory Visit: Payer: Self-pay | Admitting: *Deleted

## 2017-03-05 DIAGNOSIS — E118 Type 2 diabetes mellitus with unspecified complications: Secondary | ICD-10-CM

## 2017-03-05 DIAGNOSIS — Z794 Long term (current) use of insulin: Principal | ICD-10-CM

## 2017-03-05 DIAGNOSIS — IMO0002 Reserved for concepts with insufficient information to code with codable children: Secondary | ICD-10-CM

## 2017-03-05 DIAGNOSIS — E1165 Type 2 diabetes mellitus with hyperglycemia: Principal | ICD-10-CM

## 2017-03-05 DIAGNOSIS — J302 Other seasonal allergic rhinitis: Secondary | ICD-10-CM

## 2017-03-05 MED ORDER — ATORVASTATIN CALCIUM 40 MG PO TABS: 40.0000 mg | ORAL_TABLET | Freq: Every day | ORAL | 3 refills | Status: DC

## 2017-03-05 MED ORDER — CETIRIZINE HCL 10 MG PO TABS: 10.0000 mg | ORAL_TABLET | Freq: Every day | ORAL | 2 refills | Status: DC

## 2017-03-13 MED ORDER — CETIRIZINE HCL 10 MG PO TABS: 10.0000 mg | ORAL_TABLET | Freq: Every day | ORAL | 2 refills | Status: DC

## 2017-03-13 NOTE — Telephone Encounter (Signed)
[facility]; refilled on 12/20 "Print".

## 2017-03-13 NOTE — Telephone Encounter (Signed)
Switched to e prescription, sorry about that

## 2017-03-13 NOTE — Addendum Note (Signed)
Addended by: Chana BodeWINFREY, Zoii Florer B on: 03/13/2017 06:39 PM   Modules accepted: Orders

## 2017-03-18 ENCOUNTER — Other Ambulatory Visit: Payer: Self-pay | Admitting: *Deleted

## 2017-03-18 ENCOUNTER — Other Ambulatory Visit: Payer: Self-pay | Admitting: Internal Medicine

## 2017-03-18 DIAGNOSIS — M545 Low back pain: Principal | ICD-10-CM

## 2017-03-18 DIAGNOSIS — G8929 Other chronic pain: Secondary | ICD-10-CM

## 2017-03-18 DIAGNOSIS — I10 Essential (primary) hypertension: Secondary | ICD-10-CM

## 2017-03-18 MED ORDER — AMLODIPINE BESYLATE 5 MG PO TABS: 5.0000 mg | ORAL_TABLET | Freq: Every day | ORAL | 2 refills | Status: DC

## 2017-03-19 ENCOUNTER — Other Ambulatory Visit: Payer: Self-pay | Admitting: *Deleted

## 2017-03-19 NOTE — Telephone Encounter (Signed)
A user error has taken place: encounter opened in error, closed for administrative reasons.Mitchell Gallaga Cassady1/3/20198:48 AM  .

## 2017-04-21 ENCOUNTER — Other Ambulatory Visit: Payer: Self-pay | Admitting: Internal Medicine

## 2017-04-21 ENCOUNTER — Ambulatory Visit (INDEPENDENT_AMBULATORY_CARE_PROVIDER_SITE_OTHER): Payer: Medicare Other | Admitting: Internal Medicine

## 2017-04-21 VITALS — BP 133/75 | HR 71 | Temp 97.9°F | Ht 69.0 in | Wt 154.9 lb

## 2017-04-21 DIAGNOSIS — R05 Cough: Secondary | ICD-10-CM | POA: Diagnosis not present

## 2017-04-21 DIAGNOSIS — Z8711 Personal history of peptic ulcer disease: Secondary | ICD-10-CM

## 2017-04-21 DIAGNOSIS — I10 Essential (primary) hypertension: Secondary | ICD-10-CM | POA: Diagnosis not present

## 2017-04-21 DIAGNOSIS — Z79899 Other long term (current) drug therapy: Secondary | ICD-10-CM

## 2017-04-21 DIAGNOSIS — R059 Cough, unspecified: Secondary | ICD-10-CM

## 2017-04-21 DIAGNOSIS — R3912 Poor urinary stream: Principal | ICD-10-CM

## 2017-04-21 DIAGNOSIS — N401 Enlarged prostate with lower urinary tract symptoms: Secondary | ICD-10-CM

## 2017-04-21 DIAGNOSIS — J309 Allergic rhinitis, unspecified: Secondary | ICD-10-CM

## 2017-04-21 DIAGNOSIS — K219 Gastro-esophageal reflux disease without esophagitis: Secondary | ICD-10-CM | POA: Diagnosis not present

## 2017-04-21 HISTORY — DX: Cough, unspecified: R05.9

## 2017-04-21 MED ORDER — OMEPRAZOLE 40 MG PO CPDR: DELAYED_RELEASE_CAPSULE | ORAL | 1 refills | Status: DC

## 2017-04-21 MED ORDER — FAMOTIDINE 20 MG PO TABS: 20.0000 mg | ORAL_TABLET | Freq: Two times a day (BID) | ORAL | 1 refills | Status: DC | PRN

## 2017-04-21 MED ORDER — LOSARTAN POTASSIUM 50 MG PO TABS: 50.0000 mg | ORAL_TABLET | Freq: Every day | ORAL | 0 refills | Status: DC

## 2017-04-21 NOTE — Progress Notes (Signed)
   CC: cough  HPI:  [name] is a 62 y.o. M with a PMHx of conditions listed below presenting to the clinic c/o cough. Please see problem based charting for the status of the patient's current and chronic medical conditions.   Past Medical History:  Diagnosis Date  . Allergic rhinitis   . Chronic headache   . Diabetes mellitus   . GERD (gastroesophageal reflux disease)   . Hypertension   . Illiteracy and low-level literacy    Unclear of his literacy  in his native tongue, however, he is unable to read in [place]  . Lower back pain    w/ R LE sciatica s/p laminectomy in 2008. MRI- 04/2008   Review of Systems: Pertinent positives mentioned in HPI. Remainder of all ROS negative.   Physical Exam:  Vitals:   04/21/17 1058  BP: 133/75  Pulse: 71  Temp: 97.9 F (36.6 C)  TempSrc: Oral  SpO2: 100%  Weight: 154 lb 14.4 oz (70.3 kg)  Height: 5\' 9"  (1.753 m)   Physical Exam  Constitutional: He is oriented to person, place, and time. He appears well-developed and well-nourished. No distress.  HENT:  Head: Normocephalic and atraumatic.  Mouth/Throat: Oropharynx is clear and moist. No oropharyngeal exudate.  Crusted nasal secretions noted around the nares.   Eyes: Right eye exhibits no discharge. Left eye exhibits no discharge.  Cardiovascular: Normal rate, regular rhythm and intact distal pulses.  Pulmonary/Chest: Effort normal and breath sounds normal. No respiratory distress. He has no wheezes. He has no rales.  Abdominal: Soft. Bowel sounds are normal. He exhibits no distension. There is no tenderness.  Musculoskeletal: He exhibits no edema.  Neurological: He is alert and oriented to person, place, and time.  Skin: Skin is warm and dry.    Assessment & Plan:   See Encounters Tab for problem based charting.  Patient discussed with Dr. Oswaldo DoneVincent

## 2017-04-21 NOTE — Patient Instructions (Addendum)
Mitchell Page it was nice meeting you today.  -Dose of omeprazole has been increased.  Take omeprazole 40 mg daily.  You may also take Pepcid 20 mg twice daily as needed for heartburn.  -Continue taking Zyrtec daily for allergies  -Use a Nettie pot to clean out your sinuses.  You can buy a new kit at any local pharmacy.  Please follow instructions on the packaging.  -STOP taking quinapril as it may be causing your cough  -Start taking losartan as instructed for high blood pressure.  Continue taking amlodipine as before.  -Return for a follow-up in 4-6 weeks.

## 2017-04-21 NOTE — Telephone Encounter (Signed)
refilled 

## 2017-04-21 NOTE — Assessment & Plan Note (Addendum)
HPI Patient is presenting with a 2-week history of a dry cough.  Reports having throat irritation and occasional itchy/watery eyes.  He takes Zyrtec daily for chronic allergies.  He has a history of peptic ulcer disease and reports having uncontrolled GERD despite taking omeprazole 20 mg daily.  He is currently taking quinapril for hypertension. He denies having any sneezing, rhinorrhea, sore throat, fevers, or chills.  He is a never smoker.  On exam, noted to have crusted nasal secretions around nares.  Assessment Differentials for cough include ACE inhibitor use, upper airway cough syndrome, and GERD.  Plan -Stop quinapril -Start patient on losartan 50 mg daily for hypertension.  Continue amlodipine. -Increased dose of omeprazole to 40 mg daily -Pepcid 20 mg twice daily as needed -Continue daily Zyrtec -Nasal saline irrigations

## 2017-04-22 NOTE — Progress Notes (Signed)
Internal Medicine Clinic Attending  Case discussed with Dr. Rathoreat the time of the visit. We reviewed the resident's history and exam and pertinent patient test results. I agree with the assessment, diagnosis, and plan of care documented in the resident's note.  

## 2017-04-27 ENCOUNTER — Other Ambulatory Visit: Payer: Self-pay | Admitting: Internal Medicine

## 2017-04-27 DIAGNOSIS — M545 Low back pain, unspecified: Secondary | ICD-10-CM

## 2017-04-27 DIAGNOSIS — G8929 Other chronic pain: Secondary | ICD-10-CM

## 2017-04-27 NOTE — Telephone Encounter (Signed)
refilled 

## 2017-05-05 ENCOUNTER — Ambulatory Visit (INDEPENDENT_AMBULATORY_CARE_PROVIDER_SITE_OTHER): Payer: Medicare Other | Admitting: Internal Medicine

## 2017-05-05 ENCOUNTER — Other Ambulatory Visit: Payer: Self-pay

## 2017-05-05 VITALS — BP 140/67 | HR 86 | Temp 97.7°F | Ht 69.0 in | Wt 158.5 lb

## 2017-05-05 DIAGNOSIS — M7582 Other shoulder lesions, left shoulder: Secondary | ICD-10-CM | POA: Insufficient documentation

## 2017-05-05 DIAGNOSIS — M25511 Pain in right shoulder: Secondary | ICD-10-CM | POA: Diagnosis not present

## 2017-05-05 DIAGNOSIS — R05 Cough: Secondary | ICD-10-CM | POA: Diagnosis not present

## 2017-05-05 DIAGNOSIS — R059 Cough, unspecified: Secondary | ICD-10-CM

## 2017-05-05 DIAGNOSIS — Z8711 Personal history of peptic ulcer disease: Secondary | ICD-10-CM | POA: Diagnosis not present

## 2017-05-05 DIAGNOSIS — Z79899 Other long term (current) drug therapy: Secondary | ICD-10-CM

## 2017-05-05 DIAGNOSIS — Z8719 Personal history of other diseases of the digestive system: Secondary | ICD-10-CM | POA: Diagnosis not present

## 2017-05-05 MED ORDER — FLUTICASONE PROPIONATE 50 MCG/ACT NA SUSP: 2.0000 | Freq: Every day | NASAL | 2 refills | Status: DC

## 2017-05-05 MED ORDER — IBUPROFEN 800 MG PO TABS: 800.0000 mg | ORAL_TABLET | Freq: Three times a day (TID) | ORAL | 0 refills | Status: DC | PRN

## 2017-05-05 MED ORDER — FLUTICASONE PROPIONATE 50 MCG/ACT NA SUSP: 2.0000 | Freq: Every day | NASAL | 0 refills | Status: DC

## 2017-05-05 NOTE — Assessment & Plan Note (Signed)
Patient with continued symptoms from previous visit on 04/21/17.  See HPI for full details.  Assessment and plan Symptoms not improved after stopping his ACE inhibitor unlikely to be the source.  Has not seen any change in symptoms with increased dose of his PPI at this point either.  He does not endorse rhinorrhea and sinus congestion as well as sore throat to me today which he denied previously.  Suspect this is most likely allergy related.  He has no sinus pressure or tenderness and exam otherwise benign today.  Recommended sinus irrigation the patient and went through regimen and provided information on AVS.  Also recommended restarting his Flonase and went over proper use of this with patient and provided information on AVS as well.  Continue his Zyrtec.  Patient was agreeable with plan.

## 2017-05-05 NOTE — Progress Notes (Signed)
   CC: sinus congestion, shoulder pain  HPI:  [name] is a 62 y.o. male with a past medical history listed below here today for follow up of his sinus congestion and shoulder pain.   She was seen earlier this month 04/21/17 with complaints of nonproductive cough, sore throat, itchy watery eyes, sinus congestion and rhinorrhea.  Reports at that time he was not given anything for his symptoms and some of his medications were changed and he is unsure of what to take and what his medications do.  He brought all of his medications today to review with me.  It appears that at that time were concerned for possible ACE induced cough and switched his Toprol for losartan and increase the dose of his omeprazole from 20 mg to 40 mg daily as he had complaints of GERD symptoms.  He reports that his symptoms have not improved since that visit and continues to have nonproductive cough, rhinorrhea, sinus congestion, sore throat especially in the.  He denies any fevers or chills.  No shortness of breath or chest pain.  He notes that he is currently only taking Zyrtec for his chronic allergies.  Notes that he has been on Flonase in the past did help with his allergy symptoms greatly.  He also reports that he has had ongoing right shoulder pain for at least the past month.  He denies any history of trauma or injury to that shoulder.  Denies any weakness or numbness.  But does note pain mostly over the deltoid region.  Reports the pain is worse at night and with any overhead activity.  He notes similar symptoms on the left shoulder previously that resolved with short course of NSAIDs.   Past Medical History:  Diagnosis Date  . Allergic rhinitis   . Chronic headache   . Diabetes mellitus   . GERD (gastroesophageal reflux disease)   . Hypertension   . Illiteracy and low-level literacy    Unclear of his literacy  in his native tongue, however, he is unable to read in [place]  . Lower back pain    w/ R LE  sciatica s/p laminectomy in 2008. MRI- 04/2008   Review of Systems:   Negative except as noted in HPI  Physical Exam:  Vitals:   05/05/17 1439  BP: 140/67  Pulse: 86  Temp: 97.7 F (36.5 C)  TempSrc: Oral  SpO2: 99%  Weight: 158 lb 8 oz (71.9 kg)  Height: 5\' 9"  (1.753 m)   GENERAL- alert, co-operative, appears as stated age, not in any distress. HEENT- Atraumatic, normocephalic, PERRL, EOMI, oral mucosa appears moist CARDIAC- RRR, no murmurs, rubs or gallops. RESP- Moving equal volumes of air, and clear to auscultation bilaterally, no wheezes or crackles. ABDOMEN- Soft, nontender, bowel sounds present. MSK -Right shoulder with tenderness over deltoid region to palpation.  Normal range of motion both active and passive however pain with lifting arm past 90 degrees as well as with internal rotation.  Strength sensation normal throughout.  No bony abnormalities. EXTREMITIES- pulse 2+, symmetric, no pedal edema. SKIN- Warm, dry, No rash or lesion. PSYCH- Normal mood and affect, appropriate thought content and speech.   Assessment & Plan:   See Encounters Tab for problem based charting.  Patient discussed with Dr. Rogelia BogaButcher

## 2017-05-05 NOTE — Assessment & Plan Note (Signed)
See HPI for full details.  Exam consistent with rotator cuff tendinitis.  She is requesting x-rays for further evaluation.  Do not feel that x-ray is warranted at this time as no evidence for bony abnormality.  Recommend conservative treatment with short course of NSAIDs.  Also recommend physical therapy.  Patient agreeable with plan.  He requests ibuprofen 800 mg to be sent to his pharmacy as this has worked for him in the past with a similar episode of left shoulder pain.  He does have a history of peptic ulcer disease although EGD from 2011 only showed gastritis and esophagitis.  Does not appear to have any history of bleeding.  He is on a PPI.

## 2017-05-05 NOTE — Patient Instructions (Addendum)
Mitchell Page,   I would like you to try the sinus irrigation and I have provided instructions below. I would also like for you to start using the nasal steroid spray again as well.  For your shoulder pain, you can try ibuprofen or naproxen and see if that helps but I think it will be important to go to physical therapy to work on getting over what I think is tendonitis in the muscles of your shoulder.    BUFFERED ISOTONIC SALINE NASAL IRRIGATION  The Benefits:  1. When you irrigate, the isotonic saline (salt water) acts as a solvent and washes the mucus crusts and other debris from your nose.  2. This decongests and improves the airflow into your nose. The sinus passages begin to open.  3. Studies have also shown that a salt water and an alkaline (baking soda) irrigation solution improves nasal membrane cell function (mucociliary flow of mucus debris).  The Recipe:  1. Choose a 1-quart glass jar that is thoroughly cleansed.  2. Fill with sterile or distilled water, or you can boil water from the tap.  3. Add 1 to 2 heaping teaspoons of "pickling/canning/sea" salt (NOT table salt as it contains a large number of additives). This salt is available at the grocery store in the food canning section.  4. Add 1 teaspoon of [employer] Hammer Baking Soda (pure bicarbonate).  5. Mix ingredients together and store at room temperature. Discard after one week. If you find this solution too strong, you may decrease the amount of salt added to 1 to 1  teaspoons. With children it is often best to start with a milder solution and advance slowly. Irrigate with 240 ml (8 oz) twice daily.  The Instructions:  You should plan to irrigate your nose with buffered isotonic saline 2 times per day. Many people prefer to warm the solution slightly in the microwave - but be sure that the solution is NOT HOT. Stand over the sink (some do this in the shower) and squirt the solution into each side of your nose, keeping  your mouth open. This allows you to spit the saltwater out of your mouth. It will not harm you if you swallow a little.  If you have been told to use a nasal steroid such as Flonase, Nasonex, or Nasacort, you should always use isortonic saline solution first, then use your nasal steroid product. The nasal steroid is much more effective when sprayed onto clean nasal membranes and the steroid medicine will reach deeper into the nose.  Most people experience a little burning sensation the first few times they use a isotonic saline solution, but this usually goes away within a few days.   NASAL STEROID USE INSTRUCTIONS  Step 1. Prepare the nose. Blow the nose before administering the drug.  Step 2. Prime and activate the delivery device as recommended by the manufacturer.  Step 3. Position the head by tilting the head forward.  Step 4. Insert the tip of the applicator gently, avoiding contact with the septum.  Step 5. Aim the applicator tip about 45 from the floor of the nose and direct it at the outer corner of the eye on the same side to avoid traumatizing or spraying the septum.  Step 6. Close the other nostril gently with a finger.   Step 7. Sniff or inhale gently while delivering the drug.

## 2017-05-08 NOTE — Progress Notes (Signed)
Internal Medicine Clinic Attending  Case discussed with Dr. Boswell at the time of the visit.  We reviewed the resident's history and exam and pertinent patient test results.  I agree with the assessment, diagnosis, and plan of care documented in the resident's note.  

## 2017-05-15 ENCOUNTER — Other Ambulatory Visit: Payer: Self-pay | Admitting: Internal Medicine

## 2017-05-15 DIAGNOSIS — IMO0002 Reserved for concepts with insufficient information to code with codable children: Secondary | ICD-10-CM

## 2017-05-15 DIAGNOSIS — E118 Type 2 diabetes mellitus with unspecified complications: Secondary | ICD-10-CM

## 2017-05-15 DIAGNOSIS — E1165 Type 2 diabetes mellitus with hyperglycemia: Secondary | ICD-10-CM

## 2017-05-15 DIAGNOSIS — I10 Essential (primary) hypertension: Secondary | ICD-10-CM

## 2017-06-01 ENCOUNTER — Encounter (INDEPENDENT_AMBULATORY_CARE_PROVIDER_SITE_OTHER): Payer: Self-pay

## 2017-06-01 ENCOUNTER — Ambulatory Visit (INDEPENDENT_AMBULATORY_CARE_PROVIDER_SITE_OTHER): Payer: Medicare Other | Admitting: Internal Medicine

## 2017-06-01 DIAGNOSIS — M549 Dorsalgia, unspecified: Secondary | ICD-10-CM | POA: Diagnosis not present

## 2017-06-01 DIAGNOSIS — G8929 Other chronic pain: Secondary | ICD-10-CM

## 2017-06-01 DIAGNOSIS — Z79891 Long term (current) use of opiate analgesic: Secondary | ICD-10-CM | POA: Diagnosis not present

## 2017-06-01 DIAGNOSIS — M5126 Other intervertebral disc displacement, lumbar region: Secondary | ICD-10-CM | POA: Diagnosis not present

## 2017-06-01 DIAGNOSIS — M545 Low back pain, unspecified: Secondary | ICD-10-CM

## 2017-06-01 DIAGNOSIS — Z9889 Other specified postprocedural states: Secondary | ICD-10-CM

## 2017-06-01 MED ORDER — HYDROCODONE-ACETAMINOPHEN 5-325 MG PO TABS: 1.0000 | ORAL_TABLET | Freq: Four times a day (QID) | ORAL | 0 refills | Status: DC | PRN

## 2017-06-01 NOTE — Assessment & Plan Note (Signed)
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  Takes tramadol daily for this.  Yesterday, he and his wife were moving items around the house, doing a lot of bending and stooping down.  Went to sit down last evening and when he stood up had acute worsening of his back pain.  No obvious injury that he can recall.  Took ibuprofen last PM without relief.  Took ibuprofen plus tramadol this AM with only minimal improvement.  Suspect acute exacerbation of chronic back pain with lumbosacral strain.  Plan: - Given Rx for Norco 5-325 mg Q6H prn.  Given #20 tabs no refills.  Explained to patient this was 1 time prescription.  - Hold tramadol while taking Norco - Continue ibuprfen as needed - Continue to be active as tolerated - Ice/heat - Follow up with PCP at next available, sooner if needed. - No role PT/imaging at this point given acuity of injury and benign findings on PE

## 2017-06-01 NOTE — Patient Instructions (Signed)
FOLLOW-UP INSTRUCTIONS When: with your primary doctor at his next appointment For: back pain What to bring: your medications  I have prescribed a stronger pain medication for a few days to take as needed  Do not take Tramadol at the same time.  You can still take your ibuprofen  This may take several weeks until it is completely better.

## 2017-06-01 NOTE — Progress Notes (Signed)
   CC: acute back pain  HPI:  [name] is a 62 y.o. man with a past medical history listed below here today for acute back pain.   For details of today's visit and the status of his chronic medical issues please refer to the assessment and plan.   Past Medical History:  Diagnosis Date  . Allergic rhinitis   . Chronic headache   . Diabetes mellitus   . GERD (gastroesophageal reflux disease)   . Hypertension   . Illiteracy and low-level literacy    Unclear of his literacy  in his native tongue, however, he is unable to read in [place]  . Lower back pain    w/ R LE sciatica s/p laminectomy in 2008. MRI- 04/2008   Review of Systems:   Review of Systems  Constitutional: Negative for chills and fever.  Musculoskeletal: Positive for back pain. Negative for falls.  Neurological: Negative for tingling and focal weakness.     Physical Exam:  Vitals:   06/01/17 1419  BP: 121/71  Pulse: 78  Temp: 97.6 F (36.4 C)  TempSrc: Oral  SpO2: 100%  Weight: 159 lb 8 oz (72.3 kg)  Height: 5\' 9"  (1.753 m)   Physical Exam  Constitutional: He is well-developed, well-nourished, and in no distress.  Musculoskeletal: He exhibits tenderness.  Lumbar paraspinal tenderness present. Strength 5/5 with bilateral hip flexion and knee extension/flexion.  Skin: Skin is warm and dry.  Psychiatric: Mood and affect normal.    Assessment & Plan:   See Encounters Tab for problem based charting.  Patient discussed with Dr. Rogelia BogaButcher.  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  Takes tramadol daily for this.  Yesterday, he and his wife were moving items around the house, doing a lot of bending and stooping down.  Went to sit down last evening and when he stood up had acute worsening of his back pain.  No obvious injury that he can recall.  Took ibuprofen last PM without relief.  Took ibuprofen plus tramadol this AM  with only minimal improvement.  Suspect acute exacerbation of chronic back pain with lumbosacral strain.  Plan: - Given Rx for Norco 5-325 mg Q6H prn.  Given #20 tabs no refills.  Explained to patient this was 1 time prescription.  - Hold tramadol while taking Norco - Continue ibuprfen as needed - Continue to be active as tolerated - Ice/heat - Follow up with PCP at next available, sooner if needed. - No role PT/imaging at this point given acuity of injury and benign findings on PE

## 2017-06-02 NOTE — Progress Notes (Signed)
Internal Medicine Clinic Attending  Case discussed with Dr. Wallace at the time of the visit.  We reviewed the resident's history and exam and pertinent patient test results.  I agree with the assessment, diagnosis, and plan of care documented in the resident's note.  

## 2017-06-30 ENCOUNTER — Other Ambulatory Visit: Payer: Self-pay | Admitting: *Deleted

## 2017-06-30 DIAGNOSIS — E118 Type 2 diabetes mellitus with unspecified complications: Principal | ICD-10-CM

## 2017-06-30 DIAGNOSIS — IMO0002 Reserved for concepts with insufficient information to code with codable children: Secondary | ICD-10-CM

## 2017-06-30 DIAGNOSIS — Z794 Long term (current) use of insulin: Principal | ICD-10-CM

## 2017-06-30 DIAGNOSIS — E1165 Type 2 diabetes mellitus with hyperglycemia: Secondary | ICD-10-CM

## 2017-07-01 MED ORDER — GLUCOSE BLOOD VI STRP: ORAL_STRIP | 12 refills | Status: DC

## 2017-07-08 ENCOUNTER — Ambulatory Visit (INDEPENDENT_AMBULATORY_CARE_PROVIDER_SITE_OTHER): Payer: Medicare Other | Admitting: Family Medicine

## 2017-07-08 ENCOUNTER — Encounter: Payer: Self-pay | Admitting: Family Medicine

## 2017-07-08 ENCOUNTER — Other Ambulatory Visit: Payer: Self-pay

## 2017-07-08 VITALS — BP 118/58 | HR 83 | Temp 98.0°F | Ht 70.75 in | Wt 157.0 lb

## 2017-07-08 DIAGNOSIS — IMO0002 Reserved for concepts with insufficient information to code with codable children: Secondary | ICD-10-CM

## 2017-07-08 DIAGNOSIS — E1165 Type 2 diabetes mellitus with hyperglycemia: Secondary | ICD-10-CM

## 2017-07-08 DIAGNOSIS — E118 Type 2 diabetes mellitus with unspecified complications: Secondary | ICD-10-CM | POA: Diagnosis present

## 2017-07-08 DIAGNOSIS — M25511 Pain in right shoulder: Secondary | ICD-10-CM | POA: Insufficient documentation

## 2017-07-08 LAB — POCT GLYCOSYLATED HEMOGLOBIN (HGB A1C): Hemoglobin A1C: 9.2

## 2017-07-08 NOTE — Assessment & Plan Note (Signed)
A1C increased to 9.5 today.  Patient has been extremely inconsistent with invokana but says he is regular with his insulin and metformin  We discussed need to take invokana consistently so that we can titrate insulin safely.   We also discussed health risks of uncontrolled diabetes

## 2017-07-08 NOTE — Patient Instructions (Signed)
It was a pleasure to see you today! Thank you for choosing [facility] for your primary care. Mitchell Page was seen for establishing care. Come back to the clinic if you have ny new concerns, and go to the emergency room if there are any life threatening symptoms.  Please tell your pharmacist that we will be refilling your meds now.  Also please call us if you don't hear from someone about the physical therapy in the next 2 weeks    If we did any lab work today, and the results require attention, either me or my nurse will get in touch with you. If everything is normal, you will get a letter in mail and a message via . If you don't hear from us in two weeks, please give us a call. Otherwise, we look forward to seeing you again at your next visit. If you have any questions or concerns before then, please call the clinic at [phone].  Please bring all your medications to every doctors visit  Sign up for My Chart to have easy access to your labs results, and communication with your Primary care physician.    Please check-out at the front desk before leaving the clinic.    Best,  Dr. Marthenia RollingScott Jazaria Page FAMILY MEDICINE RESIDENT - PGY1 07/08/2017 10:03 AM

## 2017-07-08 NOTE — Assessment & Plan Note (Signed)
Pain in right shoulder at joint.  Non radiating.  With abduction >90.   No specific trauma noted, started ~2wks ago.  No distal deficits, worse when waking up after sleeping on it.  No visible lesions/deformities.  Patient was unable to cooperate for effective passive movement testing.  Refer to physical therapy, will withold on imaging at this time

## 2017-07-08 NOTE — Progress Notes (Signed)
Subjective:  Mitchell Page is a 62 y.o. male who [facility] with a chief complaint of estabishing care.   Patient is establshing care after coming here from the internal residency clinic.  He had no concerns with his care there, but just wanted to go where the rest of his family was being seen.  He comes in today with his daughter.  ARM PAIN Main complaint is Pain in right shoulder at joint.  Non radiating.hurts more when he lifts his arm.   No specific trauma noted, started ~2wks ago.  No problem with hand and it is worse when waking up after sleeping on it.  He is "tired of being given ibuprofen" and wants an xray  Type II diabetes mellitus with manifestations, uncontrolled (HCC) We discussed that his A1C increased to 9.5 today.  Patient has been extremely inconsistent with invokana but says he is regular with his insulin and metformin.  We discussed need to take invokana consistently so that we can titrate insulin safely.   We also discussed health risks of uncontrolled diabetes  Review of Systems  Constitutional: Negative for chills, fever and weight loss.  HENT: Negative for hearing loss, nosebleeds and sore throat.   Eyes: Negative for blurred vision, pain and redness.  Respiratory: Negative for cough, shortness of breath, wheezing and stridor.   Cardiovascular: Negative for chest pain, orthopnea and leg swelling.  Gastrointestinal: Positive for constipation. Negative for blood in stool, diarrhea, nausea and vomiting.  Genitourinary: Positive for urgency. Negative for flank pain and hematuria.  Musculoskeletal: Positive for joint pain.  Skin: Negative for rash.  Neurological: Negative for speech change.  Endo/Heme/Allergies: Negative for polydipsia.  Psychiatric/Behavioral: Negative for depression and substance abuse.     Objective:  Physical Exam: BP (!) 118/58   Pulse 83   Temp 98 F (36.7 C) (Oral)   Ht 5' 10.75" (1.797 m)   Wt 157 lb (71.2 kg)    SpO2 94%   BMI 22.05 kg/m   Gen: NAD, resting comfortably.  Thin  CV: RRR with no murmurs appreciated Pulm: NWOB, CTAB with no crackles, wheezes, or rhonchi GI: Normal bowel sounds present. Soft, Nontender, Nondistended. MSK: no edema, cyanosis, or clubbing noted.  Arm ROM restricted above shoulder on right side.   No distal deficits.  No swelling/crepitus/lesions/warmth.  Patient not compliant with attempts at passive motion Skin: warm, dry Neuro: grossly normal, moves all extremities Psych: Normal affect and thought content  Results for orders placed or performed in visit on 07/08/17 (from the past 72 hour(s))  HgB A1c     Status: Abnormal   Collection Time: 07/08/17  9:28 AM  Result Value Ref Range   Hemoglobin A1C 9.2      Assessment/Plan:  Pain in joint of right shoulder Pain in right shoulder at joint.  Non radiating.  With abduction >90.   No specific trauma noted, started ~2wks ago.  No distal deficits, worse when waking up after sleeping on it.  No visible lesions/deformities.  Patient was unable to cooperate for effective passive movement testing.  Refer to physical therapy, will withold on imaging at this time  Type II diabetes mellitus with manifestations, uncontrolled (HCC) A1C increased to 9.5 today.  Patient has been extremely inconsistent with invokana but says he is regular with his insulin and metformin  We discussed need to take invokana consistently so that we can titrate insulin safely.   We also discussed health risks of uncontrolled diabetes  Marthenia Rolling, DO FAMILY MEDICINE RESIDENT - PGY1 07/08/2017 10:40 AM

## 2017-07-15 ENCOUNTER — Other Ambulatory Visit: Payer: Self-pay

## 2017-07-15 DIAGNOSIS — IMO0002 Reserved for concepts with insufficient information to code with codable children: Secondary | ICD-10-CM

## 2017-07-15 DIAGNOSIS — E1165 Type 2 diabetes mellitus with hyperglycemia: Secondary | ICD-10-CM

## 2017-07-15 DIAGNOSIS — E118 Type 2 diabetes mellitus with unspecified complications: Principal | ICD-10-CM

## 2017-07-15 DIAGNOSIS — Z794 Long term (current) use of insulin: Principal | ICD-10-CM

## 2017-07-15 MED ORDER — CANAGLIFLOZIN 100 MG PO TABS: 300.0000 mg | ORAL_TABLET | Freq: Every day | ORAL | 3 refills | Status: DC

## 2017-07-16 ENCOUNTER — Telehealth: Payer: Self-pay | Admitting: Family Medicine

## 2017-07-16 ENCOUNTER — Other Ambulatory Visit: Payer: Self-pay | Admitting: Internal Medicine

## 2017-07-18 ENCOUNTER — Other Ambulatory Visit: Payer: Self-pay | Admitting: Internal Medicine

## 2017-07-18 DIAGNOSIS — K219 Gastro-esophageal reflux disease without esophagitis: Secondary | ICD-10-CM

## 2017-07-21 NOTE — Telephone Encounter (Signed)
Attempted to reach patient to clarify PCP. No answer and no VM set-up. Kinnie Feil, RN, BSN

## 2017-07-23 ENCOUNTER — Encounter: Payer: Medicare Other | Admitting: Internal Medicine

## 2017-07-26 ENCOUNTER — Other Ambulatory Visit: Payer: Self-pay | Admitting: Internal Medicine

## 2017-07-26 DIAGNOSIS — M545 Low back pain: Principal | ICD-10-CM

## 2017-07-26 DIAGNOSIS — G8929 Other chronic pain: Secondary | ICD-10-CM

## 2017-07-27 ENCOUNTER — Telehealth: Payer: Self-pay | Admitting: Family Medicine

## 2017-07-27 ENCOUNTER — Other Ambulatory Visit: Payer: Self-pay | Admitting: Family Medicine

## 2017-07-27 ENCOUNTER — Encounter: Payer: Medicare Other | Admitting: Internal Medicine

## 2017-07-27 DIAGNOSIS — M545 Low back pain: Principal | ICD-10-CM

## 2017-07-27 DIAGNOSIS — G8929 Other chronic pain: Secondary | ICD-10-CM

## 2017-07-27 NOTE — Telephone Encounter (Signed)
Patient came to office stated he needs RX for Tramadol. Patient stated he discussed this with pcp on last visit. Patient totally out of med for 2 days Please let patient know [phone]. [facility] on [address].

## 2017-07-31 ENCOUNTER — Other Ambulatory Visit: Payer: Self-pay | Admitting: Internal Medicine

## 2017-07-31 DIAGNOSIS — R05 Cough: Secondary | ICD-10-CM

## 2017-07-31 DIAGNOSIS — R059 Cough, unspecified: Secondary | ICD-10-CM

## 2017-08-04 ENCOUNTER — Telehealth: Payer: Self-pay

## 2017-08-04 NOTE — Telephone Encounter (Signed)
Pt called nurse line, states he was only given 7 tramadol. Called pharmacy and told tramadol requiring PA. PA submitted via [place] [employer]. Status pending will recheck in 24 hours. Shawna Orleans, RN

## 2017-08-05 DIAGNOSIS — E113312 Type 2 diabetes mellitus with moderate nonproliferative diabetic retinopathy with macular edema, left eye: Secondary | ICD-10-CM | POA: Diagnosis not present

## 2017-08-05 DIAGNOSIS — H2513 Age-related nuclear cataract, bilateral: Secondary | ICD-10-CM | POA: Diagnosis not present

## 2017-08-05 DIAGNOSIS — E113211 Type 2 diabetes mellitus with mild nonproliferative diabetic retinopathy with macular edema, right eye: Secondary | ICD-10-CM | POA: Diagnosis not present

## 2017-08-23 ENCOUNTER — Emergency Department (HOSPITAL_COMMUNITY): Payer: Medicare Other

## 2017-08-23 ENCOUNTER — Emergency Department (HOSPITAL_COMMUNITY)
Admission: EM | Admit: 2017-08-23 | Discharge: 2017-08-23 | Disposition: A | Discharge status: Home / Self Care | Payer: Medicare Other | Attending: Emergency Medicine | Admitting: Emergency Medicine

## 2017-08-23 ENCOUNTER — Encounter (HOSPITAL_COMMUNITY): Payer: Self-pay | Admitting: Emergency Medicine

## 2017-08-23 ENCOUNTER — Other Ambulatory Visit: Payer: Self-pay

## 2017-08-23 DIAGNOSIS — J069 Acute upper respiratory infection, unspecified: Secondary | ICD-10-CM

## 2017-08-23 DIAGNOSIS — R519 Headache, unspecified: Secondary | ICD-10-CM

## 2017-08-23 DIAGNOSIS — Z794 Long term (current) use of insulin: Secondary | ICD-10-CM | POA: Insufficient documentation

## 2017-08-23 DIAGNOSIS — E119 Type 2 diabetes mellitus without complications: Secondary | ICD-10-CM | POA: Insufficient documentation

## 2017-08-23 DIAGNOSIS — I10 Essential (primary) hypertension: Secondary | ICD-10-CM | POA: Diagnosis not present

## 2017-08-23 DIAGNOSIS — Z79899 Other long term (current) drug therapy: Secondary | ICD-10-CM | POA: Insufficient documentation

## 2017-08-23 DIAGNOSIS — R51 Headache: Secondary | ICD-10-CM | POA: Diagnosis present

## 2017-08-23 LAB — URINALYSIS, ROUTINE W REFLEX MICROSCOPIC
BACTERIA UA: NONE SEEN
Bilirubin Urine: NEGATIVE
Glucose, UA: >=500 mg/dL — AB
Hgb urine dipstick: NEGATIVE
Ketones, ur: NEGATIVE mg/dL
Leukocytes, UA: NEGATIVE
Nitrite: NEGATIVE
PROTEIN: NEGATIVE mg/dL
SPECIFIC GRAVITY, URINE: 1.029 (ref 1.005–1.030)
pH: 6 (ref 5.0–8.0)

## 2017-08-23 LAB — CBC WITH DIFFERENTIAL/PLATELET
ABS IMMATURE GRANULOCYTES: 0 10*3/uL (ref 0.0–0.1)
BASOS ABS: 0 10*3/uL (ref 0.0–0.1)
Basophils Relative: 0 %
Eosinophils Absolute: 0.3 10*3/uL (ref 0.0–0.7)
Eosinophils Relative: 4 %
HEMATOCRIT: 42.9 % (ref 39.0–52.0)
HEMOGLOBIN: 15 g/dL (ref 13.0–17.0)
IMMATURE GRANULOCYTES: 0 %
LYMPHS ABS: 2 10*3/uL (ref 0.7–4.0)
LYMPHS PCT: 29 %
MCH: 27.9 pg (ref 26.0–34.0)
MCHC: 35 g/dL (ref 30.0–36.0)
MCV: 79.9 fL (ref 78.0–100.0)
Monocytes Absolute: 0.8 10*3/uL (ref 0.1–1.0)
Monocytes Relative: 12 %
NEUTROS ABS: 4 10*3/uL (ref 1.7–7.7)
NEUTROS PCT: 55 %
Platelets: 221 10*3/uL (ref 150–400)
RBC: 5.37 MIL/uL (ref 4.22–5.81)
RDW: 13.1 % (ref 11.5–15.5)
WBC: 7.2 10*3/uL (ref 4.0–10.5)

## 2017-08-23 LAB — BASIC METABOLIC PANEL
ANION GAP: 10 (ref 5–15)
BUN: 8 mg/dL (ref 6–20)
CHLORIDE: 103 mmol/L (ref 101–111)
CO2: 24 mmol/L (ref 22–32)
Calcium: 9.5 mg/dL (ref 8.9–10.3)
Creatinine, Ser: 1.07 mg/dL (ref 0.61–1.24)
GFR calc non Af Amer: >60 mL/min (ref >60)
Glucose, Bld: 119 mg/dL — ABNORMAL HIGH (ref 65–99)
POTASSIUM: 4.1 mmol/L (ref 3.5–5.1)
SODIUM: 137 mmol/L (ref 135–145)

## 2017-08-23 MED ORDER — SODIUM CHLORIDE 0.9 % IV BOLUS
1000.0000 mL | Freq: Once | INTRAVENOUS | Status: AC
  Administered 2017-08-23: 1000 mL via INTRAVENOUS

## 2017-08-23 MED ORDER — KETOROLAC TROMETHAMINE 15 MG/ML IJ SOLN
15.0000 mg | Freq: Once | INTRAMUSCULAR | Status: AC
  Administered 2017-08-23: 15 mg via INTRAVENOUS
  Filled 2017-08-23: qty 1

## 2017-08-23 MED ORDER — PROCHLORPERAZINE EDISYLATE 10 MG/2ML IJ SOLN
10.0000 mg | Freq: Once | INTRAMUSCULAR | Status: AC
  Administered 2017-08-23: 10 mg via INTRAVENOUS
  Filled 2017-08-23: qty 2

## 2017-08-23 MED ORDER — DIPHENHYDRAMINE HCL 50 MG/ML IJ SOLN
25.0000 mg | Freq: Once | INTRAMUSCULAR | Status: AC
  Administered 2017-08-23: 25 mg via INTRAVENOUS
  Filled 2017-08-23: qty 1

## 2017-08-23 NOTE — ED Provider Notes (Signed)
MSE was initiated and I personally evaluated the patient and placed orders (if any) at  6:51 PM on August 23, 2017. Mitchell Page is a 62 y.o. male with hx of DM and HTN  who presents to the ED with headache, congestion and productive cough with yellow sputum. Patient reports that the headache is so bad he can no open his eyes. Patient states he has pain in his lower back that is 8/10, dysuria. He is taking Tramadol that is not relieving any of his pain and in addition Tylenol. Patient recently returned from [place] where he had been for 2 weeks.  BP 129/88 (BP Location: Right Arm)   Pulse 83   Temp 97.9 F (36.6 C) (Oral)   Resp 16   Ht 5\' 9"  (1.753 m)   Wt 71.2 kg (157 lb)   SpO2 99%   BMI 23.18 kg/m     Patient alert and oriented, TM's normal. Heart regular rate and rhythm. Lungs: decreased breath sounds right. Abdomen soft, non tender.   The patient appears stable so that the remainder of the MSE may be completed by another provider.   Kerrie Buffaloeese, Hope [place], [place] 08/23/17 1921    Charlynne PanderYao, David Hsienta, MD 08/25/17 0700

## 2017-08-23 NOTE — ED Triage Notes (Addendum)
Pt reports rt sided headache, congestion and a productive cough with yellow mucous. Pt reports pain hurts so much that he can't open his eyes. No neuro deficits. Pt reports lower back pain 8/10. Pt reports burning with urination. Pt reports home Tramadol is not relieving his back pain and Tylenol is not relieving his headache. Pt recently returned from [place] on Wednesday for 8 days. Hx diabetes and HTN.

## 2017-08-23 NOTE — ED Notes (Signed)
Urine culture collected with sample.

## 2017-08-23 NOTE — ED Provider Notes (Signed)
[facility] EMERGENCY DEPARTMENT Provider Note   CSN: [id number] Arrival date & time: 08/23/17  1807   History   Chief Complaint Chief Complaint  Patient presents with  . Headache  . Back Pain  . Cough    HPI Mitchell Page is a 62 y.o. male.  The history is provided by the patient.   62 y/o M with PMHx chronic low back pain, HTN, DM who presents with 1 day worsening right sided headache. Describes headache as severe, nonradiating, localized behind right eye, "feels like someone is stabbing me with a knife." Tylenol provides no relief. Exacerbated with light. No similar headache in the past. Gradual onset. Accompanied by 5 days URI symptoms, productive cough with yellow sputum. Also c/o acute on chronic right low back pain. States pain typically relieved by tramadol but is now worse. Endorses some dysuria, but denies hematuria, flank pain, fevers. Returned from [place] 5 days ago. Denies chest pain, dyspnea, leg swelling. No vision changes, weakness, numbness.   Past Medical History:  Diagnosis Date  . Allergic rhinitis   . Chronic headache   . Diabetes mellitus   . GERD (gastroesophageal reflux disease)   . Hypertension   . Illiteracy and low-level literacy    Unclear of his literacy  in his native tongue, however, he is unable to read in [place]  . Lower back pain    w/ R LE sciatica s/p laminectomy in 2008. MRI- 04/2008    Patient Active Problem List   Diagnosis Date Noted  . Pain in joint of right shoulder 07/08/2017  . Rotator cuff tendonitis, left 05/05/2017  . Cough 04/21/2017  . BPH (benign prostatic hyperplasia) 01/19/2017  . Myalgia 12/16/2016  . Insomnia 12/03/2016  . Chronic, continuous use of opioids 03/18/2016  . Neuropathic pain of left forearm 11/09/2015  . Healthcare maintenance 11/09/2013  . Erectile dysfunction 12/10/2012  . PUD (peptic ulcer disease) 06/19/2010  . Headache 09/13/2009  . Allergic rhinitis 09/11/2009  .  Essential hypertension 06/01/2009  . Back pain 09/15/2008  . Type II diabetes mellitus with manifestations, uncontrolled ([place]) 09/16/1999    Past Surgical History:  Procedure Laterality Date  . LUMBAR LAMINECTOMY  2008   l4-5  . Repair of left arm fracture          Home Medications    Prior to Admission medications   Medication Sig Start Date End Date Taking? Authorizing Provider  ACCU-CHEK FASTCLIX LANCETS MISC Use to test blood glucose up to 3 times daily. Dx E11.8, E11.65, Z79.4 05/12/16   Minus Liberty, MD  amLODipine (NORVASC) 5 MG tablet Take 1 tablet (5 mg total) by mouth daily. 03/18/17   Katherine Roan, MD  atorvastatin (LIPITOR) 40 MG tablet Take 1 tablet (40 mg total) by mouth daily. 03/05/17   Katherine Roan, MD  Blood Glucose Monitoring Suppl (ACCU-CHEK GUIDE) w/Device KIT 1 kit by Does not apply route daily. Use to test blood glucose up to 3 times daily. Dx E11.8, E11.65, Z79.4 05/12/16   Minus Liberty, MD  canagliflozin [facility]) 100 MG TABS tablet Take 3 tablets (300 mg total) by mouth daily before breakfast. 07/15/17   Sherene Sires, DO  cetirizine (ZYRTEC) 10 MG tablet Take 1 tablet (10 mg total) by mouth daily. 03/13/17   Katherine Roan, MD  famotidine (PEPCID) 20 MG tablet TAKE 1 TABLET BY MOUTH TWICE DAILY AS NEEDED FOR HEARTBURN OR INDIGESTION 04/22/17   Shela Leff, MD  fluticasone (FLONASE) 50 MCG/ACT nasal  spray Place 2 sprays into both nostrils daily. 05/05/17   Maryellen Pile, MD  gabapentin (NEURONTIN) 300 MG capsule Take 1 capsule (300 mg total) 3 (three) times daily by mouth. 01/19/17   Katherine Roan, MD  glucose blood (ACCU-CHEK GUIDE) test strip Use to test blood glucose up to 3 times daily. Dx E11.8, E11.65, Z79.4 07/01/17   Katherine Roan, MD  ibuprofen (ADVIL,MOTRIN) 800 MG tablet Take 1 tablet (800 mg total) by mouth every 8 (eight) hours as needed. 05/05/17   Maryellen Pile, MD  Insulin Glargine (LANTUS SOLOSTAR) 100  UNIT/ML Solostar Pen Inject 24 units every evening. Diag code E11.49. Insulin dependent 01/19/17   Katherine Roan, MD  Insulin Pen Needle (B-D UF III MINI PEN NEEDLES) 31G X 5 MM MISC USE AS DIRECTED FOR ONCE DAILY INSULIN INJECTION 12/03/16   Katherine Roan, MD  Lancet Devices [facility]) lancets Use to check blood sugar twice a day 08/24/12   Trinda Pascal, MD  losartan (COZAAR) 50 MG tablet TAKE 1 TABLET(50 MG) BY MOUTH DAILY 07/20/17   Aldine Contes, MD  metFORMIN (GLUCOPHAGE) 1000 MG tablet TAKE 1 TABLET BY MOUTH TWICE DAILY WITH A MEAL 01/19/17   Katherine Roan, MD  omeprazole (PRILOSEC) 40 MG capsule TAKE 1 CAPSULE(40 MG) BY MOUTH DAILY 07/20/17   Aldine Contes, MD  sildenafil (REVATIO) 20 MG tablet Take 80 mg (4 tablets) no more than once daily as needed prior to sexual activity 01/19/17   Katherine Roan, MD  sodium chloride (OCEAN) 0.65 % SOLN nasal spray Place 1 spray into both nostrils as needed for congestion. 07/24/15   Elnora Morrison, MD  tamsulosin (FLOMAX) 0.4 MG CAPS capsule TAKE 1 CAPSULE(0.4 MG) BY MOUTH DAILY 04/21/17   Katherine Roan, MD  traMADol (ULTRAM) 50 MG tablet Take 1 tablet (50 mg total) by mouth daily. for pain 07/27/17 04/23/18  Sherene Sires, DO    Family History No family history on file.  Social History Social History   Tobacco Use  . Smoking status: Never Smoker  . Smokeless tobacco: Never Used  Substance Use Topics  . Alcohol use: No    Alcohol/week: 0.0 oz  . Drug use: No     Allergies   Patient has no known allergies.   Review of Systems Review of Systems  Constitutional: Negative for chills and fever.  HENT: Positive for congestion. Negative for ear pain and sore throat.   Eyes: Positive for photophobia. Negative for pain and visual disturbance.  Respiratory: Positive for cough. Negative for shortness of breath.   Cardiovascular: Negative for chest pain and palpitations.  Gastrointestinal: Negative for abdominal  pain and vomiting.  Genitourinary: Positive for dysuria. Negative for hematuria.  Musculoskeletal: Positive for back pain. Negative for arthralgias.  Skin: Negative for color change and rash.  Neurological: Negative for seizures and syncope.  All other systems reviewed and are negative.    Physical Exam Updated Vital Signs BP 138/78   Pulse 73   Temp 97.9 F (36.6 C) (Oral)   Resp 12   Ht 5' 9" (1.753 m)   Wt 71.2 kg (157 lb)   SpO2 99%   BMI 23.18 kg/m   Physical Exam  Constitutional: He is oriented to person, place, and time. He appears well-developed and well-nourished.  HENT:  Head: Normocephalic and atraumatic.  Nose: Right sinus exhibits frontal sinus tenderness.  Mouth/Throat: Oropharynx is clear and moist.  No temporal tenderness  Eyes: Pupils are equal, round,  and reactive to light. Conjunctivae and EOM are normal.  Neck: Normal range of motion. Neck supple.  No meningismus  Cardiovascular: Normal rate, regular rhythm and intact distal pulses.  No murmur heard. Pulmonary/Chest: Effort normal and breath sounds normal. No stridor. No respiratory distress. He has no wheezes.  Abdominal: Soft. He exhibits no distension. There is no tenderness. There is no guarding.  Musculoskeletal: Normal range of motion. He exhibits no edema.       Back:  Intact strength and sensation to BLE  Neurological: He is alert and oriented to person, place, and time. No sensory deficit.  CN II-XII intact, intact strength and sensation throughout, neg pronator drift  Skin: Skin is warm and dry.  Psychiatric: He has a normal mood and affect.  Nursing note and vitals reviewed.    ED Treatments / Results  Labs (all labs ordered are listed, but only abnormal results are displayed) Labs Reviewed  URINALYSIS, ROUTINE W REFLEX MICROSCOPIC - Abnormal; Notable for the following components:      Result Value   Glucose, UA >=500 (*)    All other components within normal limits  BASIC  METABOLIC PANEL - Abnormal; Notable for the following components:   Glucose, Bld 119 (*)    All other components within normal limits  CBC WITH DIFFERENTIAL/PLATELET    EKG None  Radiology Dg Chest 2 View  Result Date: 08/23/2017 CLINICAL DATA:  62 y/o  M; 2 days of cough.  Recent foreign travel. EXAM: CHEST - 2 VIEW COMPARISON:  07/25/2009 chest radiograph. FINDINGS: Stable heart size and mediastinal contours are within normal limits given projection and technique. Both lungs are clear. The visualized skeletal structures are unremarkable. IMPRESSION: No acute pulmonary process identified. Electronically Signed   By: Kristine Garbe M.D.   On: 08/23/2017 19:12   Ct Head Wo Contrast  Result Date: 08/23/2017 CLINICAL DATA:  RIGHT-side headache, congestion, productive cough, diabetes mellitus, hypertension, recently returned from [place] EXAM: CT HEAD WITHOUT CONTRAST TECHNIQUE: Contiguous axial images were obtained from the base of the skull through the vertex without intravenous contrast. Sagittal and coronal MPR images reconstructed from axial data set. COMPARISON:  07/24/2009 FINDINGS: Brain: Normal ventricular morphology. No midline shift or mass effect. Normal appearance of brain parenchyma. No intracranial hemorrhage, mass lesion, or evidence of acute infarction. No extra-axial fluid collections. Scattered small calcifications in falx. Vascular: Minimal atherosclerotic calcification in the internal carotid arteries. No hyperdense vessels. Skull: Intact Sinuses/Orbits: Mucosal thickening throughout the nasal passages and ethmoid air cells, minimally in LEFT maxillary sinus. Mucosal thickening with small amount of dependent fluid within the sphenoid sinus. No cell thickening and question mucosal retention cysts RIGHT maxillary sinus. Other: N/A IMPRESSION: No acute intracranial abnormalities. Scattered sinus disease changes. Electronically Signed   By: Lavonia Dana M.D.   On: 08/23/2017  19:55    Procedures Procedures (including critical care time)  Medications Ordered in ED Medications  sodium chloride 0.9 % bolus 1,000 mL (0 mLs Intravenous Stopped 08/23/17 2236)  prochlorperazine (COMPAZINE) injection 10 mg (10 mg Intravenous Given 08/23/17 2114)  diphenhydrAMINE (BENADRYL) injection 25 mg (25 mg Intravenous Given 08/23/17 2114)  ketorolac (TORADOL) 15 MG/ML injection 15 mg (15 mg Intravenous Given 08/23/17 2114)     Initial Impression / Assessment and Plan / ED Course  I have reviewed the triage vital signs and the nursing notes.  Pertinent labs & imaging results that were available during my care of the patient were reviewed by me and considered  in my medical decision making (see chart for details).     Mitchell Page is a 62 y.o. male with PMHx of HTN, chronic back pain, DM who p/w headache in setting of URI and acute on chronic back pain, recent travel overseas. Reviewed and confirmed nursing documentation for past medical history, family history, social history. VS afebrile, wnl. Exam unremarkable. Ddx includes likely viral illness, possible meningitis. No concern for CES. Question UTI with dysuria symptoms.    UA with glucose, otherwise wnl. CBC wnl. CMP wnl. CT head wnl, scattered sinus disease. CXR neg. Improved symptoms with IVF, migraine cocktail, toradol.    Old records reviewed. Labs reviewed by me and used in the medical decision making.  Imaging viewed and interpreted by me and used in the medical decision making (formal interpretation from radiologist). D/c home in stable condition, return precautions discussed. Patient agreeable with plan for d/c home.   Final Clinical Impressions(s) / ED Diagnoses   Final diagnoses:  Upper respiratory tract infection, unspecified type  Acute nonintractable headache, unspecified headache type    ED Discharge Orders    None       Norm Salt, MD 08/24/17 Blenda Nicely, MD 08/24/17 [phone]

## 2017-08-23 NOTE — ED Notes (Addendum)
This RN spoke with Abigail, PA and PA requested negative pressure room for pt. Charge RN aware.

## 2017-08-24 ENCOUNTER — Encounter: Payer: Self-pay | Admitting: Internal Medicine

## 2017-08-24 ENCOUNTER — Ambulatory Visit (INDEPENDENT_AMBULATORY_CARE_PROVIDER_SITE_OTHER): Payer: Medicare Other | Admitting: Internal Medicine

## 2017-08-24 ENCOUNTER — Other Ambulatory Visit: Payer: Self-pay

## 2017-08-24 VITALS — BP 110/64 | HR 75 | Temp 97.5°F | Wt 153.0 lb

## 2017-08-24 DIAGNOSIS — N401 Enlarged prostate with lower urinary tract symptoms: Secondary | ICD-10-CM

## 2017-08-24 DIAGNOSIS — IMO0002 Reserved for concepts with insufficient information to code with codable children: Secondary | ICD-10-CM

## 2017-08-24 DIAGNOSIS — J302 Other seasonal allergic rhinitis: Secondary | ICD-10-CM | POA: Diagnosis not present

## 2017-08-24 DIAGNOSIS — E1165 Type 2 diabetes mellitus with hyperglycemia: Secondary | ICD-10-CM

## 2017-08-24 DIAGNOSIS — E118 Type 2 diabetes mellitus with unspecified complications: Secondary | ICD-10-CM

## 2017-08-24 DIAGNOSIS — G8929 Other chronic pain: Secondary | ICD-10-CM

## 2017-08-24 DIAGNOSIS — Z760 Encounter for issue of repeat prescription: Secondary | ICD-10-CM

## 2017-08-24 DIAGNOSIS — M545 Low back pain, unspecified: Secondary | ICD-10-CM

## 2017-08-24 DIAGNOSIS — I10 Essential (primary) hypertension: Secondary | ICD-10-CM

## 2017-08-24 DIAGNOSIS — Z794 Long term (current) use of insulin: Secondary | ICD-10-CM

## 2017-08-24 DIAGNOSIS — R3912 Poor urinary stream: Secondary | ICD-10-CM

## 2017-08-24 DIAGNOSIS — K219 Gastro-esophageal reflux disease without esophagitis: Secondary | ICD-10-CM

## 2017-08-24 MED ORDER — AMLODIPINE BESYLATE 5 MG PO TABS: 5.0000 mg | ORAL_TABLET | Freq: Every day | ORAL | 0 refills | Status: DC

## 2017-08-24 MED ORDER — CETIRIZINE HCL 10 MG PO TABS: 10.0000 mg | ORAL_TABLET | Freq: Every day | ORAL | 0 refills | Status: DC

## 2017-08-24 MED ORDER — OMEPRAZOLE 40 MG PO CPDR: DELAYED_RELEASE_CAPSULE | ORAL | 0 refills | Status: DC

## 2017-08-24 MED ORDER — CANAGLIFLOZIN 300 MG PO TABS: 300.0000 mg | ORAL_TABLET | Freq: Every day | ORAL | 0 refills | Status: DC

## 2017-08-24 MED ORDER — TRAMADOL HCL 50 MG PO TABS: 50.0000 mg | ORAL_TABLET | Freq: Every day | ORAL | 0 refills | Status: DC | PRN

## 2017-08-24 MED ORDER — GABAPENTIN 300 MG PO CAPS: 300.0000 mg | ORAL_CAPSULE | Freq: Three times a day (TID) | ORAL | 0 refills | Status: DC

## 2017-08-24 MED ORDER — FAMOTIDINE 20 MG PO TABS: 20.0000 mg | ORAL_TABLET | Freq: Two times a day (BID) | ORAL | 0 refills | Status: DC | PRN

## 2017-08-24 MED ORDER — BUTALBITAL-APAP-CAFFEINE 50-325-40 MG PO TABS: 1.0000 | ORAL_TABLET | Freq: Four times a day (QID) | ORAL | 0 refills | Status: DC | PRN

## 2017-08-24 MED ORDER — ATORVASTATIN CALCIUM 40 MG PO TABS: 40.0000 mg | ORAL_TABLET | Freq: Every day | ORAL | 0 refills | Status: DC

## 2017-08-24 MED ORDER — LOSARTAN POTASSIUM 50 MG PO TABS: ORAL_TABLET | ORAL | 0 refills | Status: DC

## 2017-08-24 MED ORDER — INSULIN GLARGINE 100 UNIT/ML SOLOSTAR PEN: PEN_INJECTOR | SUBCUTANEOUS | 0 refills | Status: DC

## 2017-08-24 MED ORDER — TAMSULOSIN HCL 0.4 MG PO CAPS: ORAL_CAPSULE | ORAL | 0 refills | Status: DC

## 2017-08-24 MED ORDER — METFORMIN HCL 1000 MG PO TABS: ORAL_TABLET | ORAL | 0 refills | Status: DC

## 2017-08-24 NOTE — Patient Instructions (Signed)
Mr. Mitchell Page,  Please increase gabapentin to three times daily for your back pain. Take tramadol only on worst days. Try tylenol or ibuprofen first. Please see us back in clinic after your trip to get you set up with physical therapy and to consider a muscle relaxant. You may also want to try a topical cream like aspercreme or capsaicin.  Best, Dr. Sampson GoonFitzgerald

## 2017-08-24 NOTE — Progress Notes (Signed)
[facility]  Subjective:  Mitchell Page is a 62 y.o. male with history of HTN, PUD, migraines, BPH, and chronic back pain after surgical correction of herniated disc. He presents for medication refills prior to going [place] 3 [place] and would like medication for 90 days.  #Chronic back pain: - taking neurotin 300 mg twice daily instead of TID - had been receiving #30 tramadol daily from May 2018 until February 2019 and then received #7 tramadol x 2 in May; He had been receiving #120 every few months at end of 2017; 8/10 back pain where he had surgical correction of herniated disc. He reports pain is chronic and has been ongoing for years. Tylenol and ibuprofen do not help much. He used to take tramadol 50 mg twice daily, which he feels took care of the pain well. He reports not understanding why medication has been reduced since it worked. He no longer has pain that radiates down his leg after surgery.  ROS: No bowel/bladder incontinence  #T2DM: - has been taking 1 tablet invokana 100 mg daily instead of 300 mg because he did not realize the tablets were for 100 mg each and would prefer 300 mg dose; denies missing doses - also taking metformin 1000 mg BID and 24 U of lantus nightly - Denies increased urination/voiding difficulties with BPH  #Headache: - Improved since ED visit yesterday when he received migraine cocktail and toradol. Sinus congestion thought to be contributing as well. - Says has nasal spray at home (flonase) that helps but has not been taking recently - has taken fioricet with improvement in the past ROS: no vomiting, no vision changes  #Medication refills for travel: - patient brought current prescriptions of amlodipine, gabapentin, atorvastatin, invokana, famotidine, omeprazole, tamsulosin, tramadol, lantus, losartan, fioricet   No Known Allergies  Social History   Tobacco Use  . Smoking status: Never  Smoker  . Smokeless tobacco: Never Used  Substance Use Topics  . Alcohol use: No    Alcohol/week: 0.0 oz    Objective: Blood pressure 110/64, pulse 75, temperature (!) 97.5 F (36.4 C), temperature source Oral, weight 153 lb (69.4 kg), SpO2 96 %. Body mass index is 22.59 kg/m. Constitutional: Well-appearing male in NAD HENT: MMM, mild nasal congestion Cardiovascular: RRR, S1, S2, no m/r/g.  Pulmonary/Chest: Effort normal and breath sounds normal.  MSK: TTP over lumbar spine scar and paraspinal muscles.  Neurological: AOx3, no focal deficits. Negative straight leg raise bilaterally. Normal gait.  Skin: Skin is warm and dry. No rash noted.  Psychiatric: Normal mood and affect.  Vitals reviewed  Assessment/Plan: Back pain - Chronic. Counseled patient on why long-term tramadol not recommended (risks of constipation, dependence, not addressing underlying cause of pain). Provided #45 to try every other day prn dosing instead of daily as before. Recommended taking gabapentin TID as prescribed and trying tylenol or ibuprofen first. Also suggested topical treatment of aspercreme or capsaicin. - Patient would consider PT once back in the country.   Type II diabetes mellitus with manifestations, uncontrolled ([facility]) - Patient reports improved control with CBGs mostly in mid-100s unless he has carb-heavy food.  - Switched invokana to 300 mg tablet.  - Will return for A1c check after trip.   Medication refill - Provided 90-day refills of regular medications.  - Patient has some confusion over why he takes certain medications. Reviewed indications and how to take PPI (empty stomach, 30 minutes before meal). Discussed he  may benefit from appointment with pharmacy when he returns for medication reconciliation/tips on how to keep medications organized.   Follow-up in 3 months when back in the country for diabetes care.  Dani Gobble, MD [facility], PGY-3

## 2017-08-26 ENCOUNTER — Encounter: Payer: Self-pay | Admitting: Internal Medicine

## 2017-08-26 DIAGNOSIS — Z760 Encounter for issue of repeat prescription: Secondary | ICD-10-CM | POA: Insufficient documentation

## 2017-08-26 NOTE — Assessment & Plan Note (Signed)
-   Chronic. Counseled patient on why long-term tramadol not recommended (risks of constipation, dependence, not addressing underlying cause of pain). Provided #45 to try every other day prn dosing instead of daily as before. Recommended taking gabapentin TID as prescribed and trying tylenol or ibuprofen first. Also suggested topical treatment of aspercreme or capsaicin. - Patient would consider PT once back in the country.

## 2017-08-26 NOTE — Assessment & Plan Note (Signed)
-   Provided 90-day refills of regular medications.  - Patient has some confusion over why he takes certain medications. Reviewed indications and how to take PPI (empty stomach, 30 minutes before meal). Discussed he may benefit from appointment with pharmacy when he returns for medication reconciliation/tips on how to keep medications organized.

## 2017-08-26 NOTE — Assessment & Plan Note (Signed)
-   Patient reports improved control with CBGs mostly in mid-100s unless he has carb-heavy food.  - Switched invokana to 300 mg tablet.  - Will return for A1c check after trip.

## 2017-10-08 ENCOUNTER — Other Ambulatory Visit: Payer: Self-pay | Admitting: Internal Medicine

## 2017-10-12 ENCOUNTER — Telehealth: Payer: Self-pay | Admitting: Family Medicine

## 2017-10-12 ENCOUNTER — Other Ambulatory Visit: Payer: Self-pay | Admitting: Internal Medicine

## 2017-10-12 NOTE — Telephone Encounter (Signed)
Pharmacy( [facility] on [address])  told pt the famotidine was refused by dr.  Rock NephewPt would like to know why. Please advise

## 2017-10-12 NOTE — Telephone Encounter (Signed)
Pt informed that Rx was sent to wrong doctor. Lamonte SakaiZimmerman Rumple, Kenzo Ozment D, [place]

## 2017-10-12 NOTE — Addendum Note (Signed)
Addended by: Lamonte SakaiZIMMERMAN RUMPLE, APRIL D on: 10/12/2017 04:17 PM   Modules accepted: Orders

## 2017-10-13 MED ORDER — FAMOTIDINE 20 MG PO TABS: 20.0000 mg | ORAL_TABLET | Freq: Two times a day (BID) | ORAL | 0 refills | Status: DC | PRN

## 2017-10-30 ENCOUNTER — Other Ambulatory Visit: Payer: Self-pay | Admitting: Internal Medicine

## 2017-10-30 ENCOUNTER — Other Ambulatory Visit: Payer: Self-pay | Admitting: Family Medicine

## 2017-10-30 DIAGNOSIS — R059 Cough, unspecified: Secondary | ICD-10-CM

## 2017-10-30 DIAGNOSIS — R05 Cough: Secondary | ICD-10-CM

## 2017-10-30 NOTE — Telephone Encounter (Signed)
Pt is calling because the pharmacy never received the prescriptions from us on the Pepcid, Flonase, Tramadol, and Fioricet. Can we call these in again. jw

## 2017-10-30 NOTE — Telephone Encounter (Signed)
Looks like some of these were set to print. Mitchell Page, Mitchell Page D, [place]

## 2017-11-02 ENCOUNTER — Other Ambulatory Visit: Payer: Self-pay | Admitting: Family Medicine

## 2017-11-02 DIAGNOSIS — R05 Cough: Secondary | ICD-10-CM

## 2017-11-02 DIAGNOSIS — M545 Low back pain: Principal | ICD-10-CM

## 2017-11-02 DIAGNOSIS — M25511 Pain in right shoulder: Secondary | ICD-10-CM

## 2017-11-02 DIAGNOSIS — R059 Cough, unspecified: Secondary | ICD-10-CM

## 2017-11-02 DIAGNOSIS — G8929 Other chronic pain: Secondary | ICD-10-CM

## 2017-11-02 DIAGNOSIS — K279 Peptic ulcer, site unspecified, unspecified as acute or chronic, without hemorrhage or perforation: Secondary | ICD-10-CM

## 2017-11-02 MED ORDER — BUTALBITAL-APAP-CAFFEINE 50-325-40 MG PO TABS: 1.0000 | ORAL_TABLET | Freq: Four times a day (QID) | ORAL | 0 refills | Status: DC | PRN

## 2017-11-02 MED ORDER — FAMOTIDINE 20 MG PO TABS: ORAL_TABLET | ORAL | 0 refills | Status: DC

## 2017-11-02 MED ORDER — TRAMADOL HCL 50 MG PO TABS: 50.0000 mg | ORAL_TABLET | Freq: Every day | ORAL | 0 refills | Status: DC | PRN

## 2017-11-02 MED ORDER — FLUTICASONE PROPIONATE 50 MCG/ACT NA SUSP: 2.0000 | Freq: Every day | NASAL | 2 refills | Status: DC

## 2017-11-02 NOTE — Progress Notes (Signed)
Chronic meds refills had erroneously been set to "print" and pharmacy not receiving them.  Refills sent to pharmacy  -Dr. Parke SimmersBland

## 2017-11-02 NOTE — Telephone Encounter (Signed)
Please call patient and notify them Rx's are sent to pharmacy

## 2017-11-02 NOTE — Progress Notes (Signed)
Again, set to "print" instead of e-prescribe.  fixed

## 2017-11-04 NOTE — Telephone Encounter (Signed)
Attempted to call.  No answer and no VM. Silus Lanzo, Maryjo RochesterJessica Dawn, CMA

## 2018-02-22 ENCOUNTER — Other Ambulatory Visit: Payer: Self-pay | Admitting: Internal Medicine

## 2018-02-22 ENCOUNTER — Other Ambulatory Visit: Payer: Self-pay

## 2018-02-22 ENCOUNTER — Emergency Department (HOSPITAL_COMMUNITY)
Admission: EM | Admit: 2018-02-22 | Discharge: 2018-02-22 | Disposition: A | Discharge status: Home / Self Care | Payer: Medicare Other | Attending: Emergency Medicine | Admitting: Emergency Medicine

## 2018-02-22 DIAGNOSIS — M545 Low back pain, unspecified: Secondary | ICD-10-CM

## 2018-02-22 DIAGNOSIS — Z794 Long term (current) use of insulin: Secondary | ICD-10-CM | POA: Insufficient documentation

## 2018-02-22 DIAGNOSIS — I10 Essential (primary) hypertension: Secondary | ICD-10-CM | POA: Insufficient documentation

## 2018-02-22 DIAGNOSIS — Z79899 Other long term (current) drug therapy: Secondary | ICD-10-CM | POA: Diagnosis not present

## 2018-02-22 DIAGNOSIS — E119 Type 2 diabetes mellitus without complications: Secondary | ICD-10-CM | POA: Diagnosis not present

## 2018-02-22 MED ORDER — OXYCODONE-ACETAMINOPHEN 5-325 MG PO TABS
1.0000 | ORAL_TABLET | Freq: Once | ORAL | Status: AC
  Administered 2018-02-22: 1 via ORAL
  Filled 2018-02-22: qty 1

## 2018-02-22 MED ORDER — OXYCODONE-ACETAMINOPHEN 5-325 MG PO TABS: 1.0000 | ORAL_TABLET | Freq: Four times a day (QID) | ORAL | 0 refills | Status: DC | PRN

## 2018-02-22 NOTE — ED Provider Notes (Signed)
[facility] EMERGENCY DEPARTMENT Provider Note   CSN: [id number] Arrival date & time: 02/22/18  0805     History   Chief Complaint Chief Complaint  Patient presents with  . Back Pain    HPI Mitchell Page is a 62 y.o. male.  HPI   62 year old male presents today with complaints of back pain.  Patient notes a history of chronic back pain that he is able to generally managed with Ultram.  Patient notes that he recently took a trip to [place] where he was in the car sitting for prolonged period of time.  Patient notes after getting home from [place] the following day (2 days ago) developed pain to his low midline lumbar region.  Patient notes this is typical of his previous low back pain.  Patient denies any radiation of symptoms down into his lower back, denies any abdominal pain or neurological deficits.  Patient denies fever.  Patient denies any trauma.  He is attempted using Ultram without significant improvement in symptoms.  No other complaints here.    Past Medical History:  Diagnosis Date  . Allergic rhinitis   . Chronic headache   . Diabetes mellitus   . GERD (gastroesophageal reflux disease)   . Hypertension   . Illiteracy and low-level literacy    Unclear of his literacy  in his native tongue, however, he is unable to read in [place]  . Lower back pain    w/ R LE sciatica s/p laminectomy in 2008. MRI- 04/2008    Patient Active Problem List   Diagnosis Date Noted  . Medication refill 08/26/2017  . Pain in joint of right shoulder 07/08/2017  . Rotator cuff tendonitis, left 05/05/2017  . Cough 04/21/2017  . BPH (benign prostatic hyperplasia) 01/19/2017  . Myalgia 12/16/2016  . Insomnia 12/03/2016  . Chronic, continuous use of opioids 03/18/2016  . Neuropathic pain of left forearm 11/09/2015  . Healthcare maintenance 11/09/2013  . Erectile dysfunction 12/10/2012  . PUD (peptic ulcer disease) 06/19/2010  . Headache 09/13/2009  . Allergic  rhinitis 09/11/2009  . Essential hypertension 06/01/2009  . Back pain 09/15/2008  . Type II diabetes mellitus with manifestations, uncontrolled ([place]) 09/16/1999    Past Surgical History:  Procedure Laterality Date  . LUMBAR LAMINECTOMY  2008   l4-5  . Repair of left arm fracture          Home Medications    Prior to Admission medications   Medication Sig Start Date End Date Taking? Authorizing Provider  ACCU-CHEK FASTCLIX LANCETS MISC Use to test blood glucose up to 3 times daily. Dx E11.8, E11.65, Z79.4 05/12/16   Minus Liberty, MD  amLODipine (NORVASC) 5 MG tablet Take 1 tablet (5 mg total) by mouth daily. 08/24/17   Rogue Bussing, MD  atorvastatin (LIPITOR) 40 MG tablet Take 1 tablet (40 mg total) by mouth daily. 08/24/17   Rogue Bussing, MD  Blood Glucose Monitoring Suppl (ACCU-CHEK GUIDE) w/Device KIT 1 kit by Does not apply route daily. Use to test blood glucose up to 3 times daily. Dx E11.8, E11.65, Z79.4 05/12/16   Minus Liberty, MD  butalbital-acetaminophen-caffeine (FIORICET, ESGIC) [phone] MG tablet Take 1 tablet by mouth every 6 (six) hours as needed for headache. 11/02/17   Sherene Sires, DO  canagliflozin (INVOKANA) 300 MG TABS tablet Take 1 tablet (300 mg total) by mouth daily before breakfast. 08/24/17   Rogue Bussing, MD  cetirizine (ZYRTEC) 10 MG tablet Take 1 tablet (10 mg total)  by mouth daily. 08/24/17   Rogue Bussing, MD  famotidine (PEPCID) 20 MG tablet TAKE 1 TABLET BY MOUTH TWICE DAILY AS NEEDED FOR HEARTBURN OR INDIGESTION 11/02/17   Sherene Sires, DO  fluticasone (FLONASE) 50 MCG/ACT nasal spray Place 2 sprays into both nostrils daily. 11/02/17   Sherene Sires, DO  gabapentin (NEURONTIN) 300 MG capsule Take 1 capsule (300 mg total) by mouth 3 (three) times daily. 08/24/17   Rogue Bussing, MD  glucose blood (ACCU-CHEK GUIDE) test strip Use to test blood glucose up to 3 times daily. Dx E11.8, E11.65, Z79.4  07/01/17   Katherine Roan, MD  ibuprofen (ADVIL,MOTRIN) 800 MG tablet Take 1 tablet (800 mg total) by mouth every 8 (eight) hours as needed. 05/05/17   Maryellen Pile, MD  Insulin Glargine (LANTUS SOLOSTAR) 100 UNIT/ML Solostar Pen Inject 24 units every evening. Diag code E11.49. Insulin dependent 08/24/17   Rogue Bussing, MD  Insulin Pen Needle (B-D UF III MINI PEN NEEDLES) 31G X 5 MM MISC USE AS DIRECTED FOR ONCE DAILY INSULIN INJECTION 12/03/16   Katherine Roan, MD  Lancet Devices [facility]) lancets Use to check blood sugar twice a day 08/24/12   Trinda Pascal, MD  losartan (COZAAR) 50 MG tablet TAKE 1 TABLET(50 MG) BY MOUTH DAILY 08/24/17   Rogue Bussing, MD  metFORMIN (GLUCOPHAGE) 1000 MG tablet TAKE 1 TABLET BY MOUTH TWICE DAILY WITH A MEAL 08/24/17   Rogue Bussing, MD  omeprazole (PRILOSEC) 40 MG capsule Take daily 30 minutes before breakfast. 08/24/17   Rogue Bussing, MD  oxyCODONE-acetaminophen (PERCOCET/ROXICET) 5-325 MG tablet Take 1 tablet by mouth every 6 (six) hours as needed. 02/22/18   Hedges, Dellis Filbert, PA-C  sildenafil (REVATIO) 20 MG tablet Take 80 mg (4 tablets) no more than once daily as needed prior to sexual activity 01/19/17   Katherine Roan, MD  sodium chloride (OCEAN) 0.65 % SOLN nasal spray Place 1 spray into both nostrils as needed for congestion. 07/24/15   Elnora Morrison, MD  tamsulosin (FLOMAX) 0.4 MG CAPS capsule TAKE 1 CAPSULE(0.4 MG) BY MOUTH DAILY 08/24/17   Rogue Bussing, MD  traMADol (ULTRAM) 50 MG tablet Take 1 tablet (50 mg total) by mouth daily as needed. for pain 11/02/17 07/30/18  Sherene Sires, DO    Family History No family history on file.  Social History Social History   Tobacco Use  . Smoking status: Never Smoker  . Smokeless tobacco: Never Used  Substance Use Topics  . Alcohol use: No    Alcohol/week: 0.0 standard drinks  . Drug use: No     Allergies   Patient has no known  allergies.   Review of Systems Review of Systems  All other systems reviewed and are negative.    Physical Exam Updated Vital Signs BP (!) 143/89 (BP Location: Right Arm)   Pulse 67   Temp 97.8 F (36.6 C) (Oral)   Resp 14   SpO2 100%   Physical Exam  Constitutional: He is oriented to person, place, and time. He appears well-developed and well-nourished.  HENT:  Head: Normocephalic and atraumatic.  Eyes: Pupils are equal, round, and reactive to light. Conjunctivae are normal. Right eye exhibits no discharge. Left eye exhibits no discharge. No scleral icterus.  Neck: Normal range of motion. No JVD present. No tracheal deviation present.  Pulmonary/Chest: Effort normal. No stridor.  Musculoskeletal:  Very minimal midline lower lumbar and left lateral spinal tenderness, straight leg negative,  bilateral lower extremity sensation strength motor function intact-no rashes noted  Neurological: He is alert and oriented to person, place, and time. Coordination normal.  Psychiatric: He has a normal mood and affect. His behavior is normal. Judgment and thought content normal.  Nursing note and vitals reviewed.    ED Treatments / Results  Labs (all labs ordered are listed, but only abnormal results are displayed) Labs Reviewed - No data to display  EKG None  Radiology No results found.  Procedures Procedures (including critical care time)  Medications Ordered in ED Medications  oxyCODONE-acetaminophen (PERCOCET/ROXICET) 5-325 MG per tablet 1 tablet (1 tablet Oral Given 02/22/18 0836)     Initial Impression / Assessment and Plan / ED Course  I have reviewed the triage vital signs and the nursing notes.  Pertinent labs & imaging results that were available during my care of the patient were reviewed by me and considered in my medical decision making (see chart for details).     62 year old male presents today with acute on chronic low back pain.  Patient has no red flags  or neurological deficits.  I reviewed patient's chart noting he generally only needs Ultram for pain, his pain not relieved with normal medicine.  I discussed using 2 days of Percocet with the patient as needed only for severe pain, he will contact his primary care provider and follow-up closely with them, is given strict return precautions, he verbalized understanding and agreement to today's plan. Final Clinical Impressions(s) / ED Diagnoses   Final diagnoses:  Acute midline low back pain without sciatica    ED Discharge Orders         Ordered    oxyCODONE-acetaminophen (PERCOCET/ROXICET) 5-325 MG tablet  Every 6 hours PRN     02/22/18 0833           Okey Regal, PA-C 02/22/18 0900    Daleen Bo, MD 02/22/18 [phone]

## 2018-02-22 NOTE — ED Triage Notes (Signed)
Pt presents to ED with lower right back pain with hx of same. Recently drove to and from [place], and now pain is worse. Has taken Tramadol with no relief.

## 2018-02-22 NOTE — Discharge Instructions (Signed)
Please read attached information. If you experience any new or worsening signs or symptoms please return to the emergency room for evaluation. Please follow-up with your primary care provider or specialist as discussed. Please use medication prescribed only as directed and discontinue taking if you have any concerning signs or symptoms.   °

## 2018-03-02 ENCOUNTER — Ambulatory Visit: Payer: Medicare Other | Admitting: Family Medicine

## 2018-03-12 ENCOUNTER — Encounter: Payer: Self-pay | Admitting: Family Medicine

## 2018-03-12 ENCOUNTER — Other Ambulatory Visit: Payer: Self-pay

## 2018-03-12 ENCOUNTER — Ambulatory Visit (INDEPENDENT_AMBULATORY_CARE_PROVIDER_SITE_OTHER): Payer: Medicare Other | Admitting: Family Medicine

## 2018-03-12 VITALS — BP 146/80 | HR 72 | Temp 98.3°F | Ht 69.0 in | Wt 160.6 lb

## 2018-03-12 DIAGNOSIS — M545 Low back pain, unspecified: Secondary | ICD-10-CM

## 2018-03-12 DIAGNOSIS — Z23 Encounter for immunization: Secondary | ICD-10-CM

## 2018-03-12 DIAGNOSIS — IMO0002 Reserved for concepts with insufficient information to code with codable children: Secondary | ICD-10-CM

## 2018-03-12 DIAGNOSIS — E118 Type 2 diabetes mellitus with unspecified complications: Secondary | ICD-10-CM | POA: Diagnosis not present

## 2018-03-12 DIAGNOSIS — G8929 Other chronic pain: Secondary | ICD-10-CM

## 2018-03-12 DIAGNOSIS — Z0000 Encounter for general adult medical examination without abnormal findings: Secondary | ICD-10-CM

## 2018-03-12 DIAGNOSIS — E1165 Type 2 diabetes mellitus with hyperglycemia: Secondary | ICD-10-CM

## 2018-03-12 LAB — POCT GLYCOSYLATED HEMOGLOBIN (HGB A1C): HbA1c, POC (controlled diabetic range): 7.4 % — AB (ref 0.0–7.0)

## 2018-03-12 MED ORDER — TRAMADOL HCL 50 MG PO TABS: 50.0000 mg | ORAL_TABLET | Freq: Every day | ORAL | 0 refills | Status: DC | PRN

## 2018-03-12 NOTE — Assessment & Plan Note (Signed)
Stable chronic back pain occasionally with some sciatic symptoms.  No leg weakness no urinary symptoms no saddle anesthesia no history of IV drug use  Refill chronic tramadol, he uses this on a as needed basis

## 2018-03-12 NOTE — Patient Instructions (Signed)
It was a pleasure to see you today! Thank you for choosing [facility] for your primary care. Mitchell Page was seen for health checkup after his trip to [place]. Come back to the clinic if you have any routine needs, and go to the emergency room if if you have any emergency symptoms.  Today we checked your A1c which showed that your diabetes was much better controlled while you are in [place].  Please try to mimic that diet while you are here in the states because it was very healthy for you.  Also we are going to check your cholesterol on the way out the door, if the results show that we need to do any new treatment for you I will call you we can discuss it.  If we did any lab work today that did not result today, one of two things will happen.  1. If everything is normal, you will get a letter in mail sent to the address in your chart with the results for your records.  It is important to keep your address up to date as that is where we will send results.  2. If the results require some sort of discussion, my nurses or myself will call you on the phone number listed in your records.  It is important to keep your phone number up to date in our system as this is how we will try to reach you.  If we cannot reach you on the phone, we will try to send you a letter in the mail so please enable to voicemail function of your phone.  If you don't hear from us in two weeks, please give us a call to verify your results. Otherwise, we look forward to seeing you again at your next visit. If you have any questions or concerns before then, please call the clinic at [phone].   Please bring all your medications to every doctors visit   Sign up for My Chart to have easy access to your labs results, and communication with your Primary care physician.     Please check-out at the front desk before leaving the clinic.     Best,  Dr. Marthenia RollingScott Aseel Page FAMILY MEDICINE RESIDENT - PGY2 03/12/2018 11:20  AM

## 2018-03-12 NOTE — Assessment & Plan Note (Signed)
Patient's for the last 6 months in [place], which he attributes the decrease in his A1c down to 7.4.  Says that he will get new batteries for his monitor but he has no new changes in medication.  If his monitor has a problems beyond the battery he will let me know and we will refill it.

## 2018-03-12 NOTE — Progress Notes (Signed)
    Subjective:  Mitchell Page is a 62 y.o. male who [facility] with a chief complaint of health checkup and medication review.   HPI: Patient with no physical complaints states that he schedule an appointment for checkup as he has been [place] for last 6 months and this is his normal habit.  He says he has been regular with his medications and does not have any emergency refills.  We discussed diabetes he said that his blood sugars have been good although in the last week his monitor has stopped working.  When reviewed by the lab staff it appears that the batteries may be dead so we discussed how to [facility] [employer] [facility] to get these replaced.  If it is not the batteries we will order new monitor for him he says that his blood sugars have been good in the low 100s while he was in [place].  We discussed he is overdue for a cholesterol check, and he consents to get that done he says that he has been very healthy when his [place] and expects it will be doing just fine  Patient also requests flu shot because "I need to get it done "he denies any recent sick contacts"    Objective:  Physical Exam: BP (!) 146/80   Pulse 72   Temp 98.3 F (36.8 C) (Oral)   Ht 5\' 9"  (1.753 m)   Wt 160 lb 9.6 oz (72.8 kg)   SpO2 97%   BMI 23.72 kg/m   Gen: NAD, resting comfortably CV: RRR with no murmurs appreciated Pulm: NWOB, CTAB with no crackles, wheezes, or rhonchi GI: Normal bowel sounds present. Soft, Nontender, Nondistended. MSK: no edema, cyanosis, or clubbing noted Skin: warm, dry Neuro: grossly normal, moves all extremities Psych: Normal affect and thought content Foot: Feet are without any wounds or obvious abnormalities.  Using monofilament there appears to be no major sensation deficits.  The only portion that he could not feel was over the thick callus of the heel which is  not abnormal.  We discussed need for daily foot inspection and maintenance.  Results for orders placed or  performed in visit on 03/12/18 (from the past 72 hour(s))  HgB A1c     Status: Abnormal   Collection Time: 03/12/18 11:07 AM  Result Value Ref Range   Hemoglobin A1C     HbA1c POC (<> result, manual entry)     HbA1c, POC (prediabetic range)     HbA1c, POC (controlled diabetic range) 7.4 (A) 0.0 - 7.0 %     Assessment/Plan:  Type II diabetes mellitus with manifestations, uncontrolled (HCC) Patient's for the last 6 months in [place], which he attributes the decrease in his A1c down to 7.4.  Says that he will get new batteries for his monitor but he has no new changes in medication.  If his monitor has a problems beyond the battery he will let me know and we will refill it.  Back pain Stable chronic back pain occasionally with some sciatic symptoms.  No leg weakness no urinary symptoms no saddle anesthesia no history of IV drug use  Refill chronic tramadol, he uses this on a as needed basis  Healthcare maintenance Patient overdue for lipid panel, will call results of requiring treatment  Need for immunization against influenza Patient consents to flu shot   Marthenia RollingScott Sigurd Pugh, DO FAMILY MEDICINE RESIDENT - PGY2 03/12/2018 11:43 AM

## 2018-03-12 NOTE — Assessment & Plan Note (Signed)
Patient overdue for lipid panel, will call results of requiring treatment

## 2018-03-12 NOTE — Assessment & Plan Note (Signed)
Patient consents to flu shot 

## 2018-03-13 LAB — LIPID PANEL
CHOLESTEROL TOTAL: 160 mg/dL (ref 100–199)
Chol/HDL Ratio: 2.7 ratio (ref 0.0–5.0)
HDL: 60 mg/dL (ref >39)
LDL Calculated: 88 mg/dL (ref 0–99)
Triglycerides: 62 mg/dL (ref 0–149)
VLDL Cholesterol Cal: 12 mg/dL (ref 5–40)

## 2018-03-26 ENCOUNTER — Other Ambulatory Visit: Payer: Self-pay | Admitting: Internal Medicine

## 2018-03-26 DIAGNOSIS — IMO0002 Reserved for concepts with insufficient information to code with codable children: Secondary | ICD-10-CM

## 2018-03-26 DIAGNOSIS — E1165 Type 2 diabetes mellitus with hyperglycemia: Secondary | ICD-10-CM

## 2018-03-26 DIAGNOSIS — E118 Type 2 diabetes mellitus with unspecified complications: Principal | ICD-10-CM

## 2018-03-26 DIAGNOSIS — Z794 Long term (current) use of insulin: Principal | ICD-10-CM

## 2018-03-26 MED ORDER — GABAPENTIN 300 MG PO CAPS: 300.0000 mg | ORAL_CAPSULE | Freq: Three times a day (TID) | ORAL | 0 refills | Status: DC

## 2018-03-26 NOTE — Addendum Note (Signed)
Addended by: Nigel Mormon on: 03/26/2018 04:41 PM   Modules accepted: Orders

## 2018-04-15 ENCOUNTER — Other Ambulatory Visit: Payer: Self-pay | Admitting: Internal Medicine

## 2018-04-15 DIAGNOSIS — E1165 Type 2 diabetes mellitus with hyperglycemia: Secondary | ICD-10-CM

## 2018-04-15 DIAGNOSIS — E118 Type 2 diabetes mellitus with unspecified complications: Principal | ICD-10-CM

## 2018-04-15 DIAGNOSIS — Z794 Long term (current) use of insulin: Principal | ICD-10-CM

## 2018-04-15 DIAGNOSIS — IMO0002 Reserved for concepts with insufficient information to code with codable children: Secondary | ICD-10-CM

## 2018-04-19 NOTE — Telephone Encounter (Signed)
Patient came by to let us know he has not received his script for Metformin at pharmacy yet and has been out of it for 3 days and his sugar is high.  Please fill and contact patient to let him know status of this.  Best number today is [phone].

## 2018-04-20 MED ORDER — METFORMIN HCL 1000 MG PO TABS: 1000.0000 mg | ORAL_TABLET | Freq: Two times a day (BID) | ORAL | 0 refills | Status: DC

## 2018-04-20 NOTE — Addendum Note (Signed)
Addended by: Nigel Mormon on: 04/20/2018 08:40 AM   Modules accepted: Orders

## 2018-04-20 NOTE — Telephone Encounter (Signed)
Re-sent due to transmission failure. Alisa Brake, RN ([facility] RN)  

## 2018-04-21 ENCOUNTER — Telehealth: Payer: Self-pay

## 2018-04-21 ENCOUNTER — Other Ambulatory Visit: Payer: Self-pay | Admitting: Family Medicine

## 2018-04-21 DIAGNOSIS — Z794 Long term (current) use of insulin: Principal | ICD-10-CM

## 2018-04-21 DIAGNOSIS — E118 Type 2 diabetes mellitus with unspecified complications: Principal | ICD-10-CM

## 2018-04-21 DIAGNOSIS — E1165 Type 2 diabetes mellitus with hyperglycemia: Secondary | ICD-10-CM

## 2018-04-21 DIAGNOSIS — IMO0002 Reserved for concepts with insufficient information to code with codable children: Secondary | ICD-10-CM

## 2018-04-21 MED ORDER — METFORMIN HCL 500 MG PO TABS: 1000.0000 mg | ORAL_TABLET | Freq: Two times a day (BID) | ORAL | 1 refills | Status: DC

## 2018-04-21 NOTE — Telephone Encounter (Signed)
Fax from pharmacy that Metformin is on national backorder. Requests new Rx for Metformin 500 mg.  Ples Specter, RN [facility] RN)

## 2018-04-21 NOTE — Progress Notes (Signed)
National back order 1000 mg tabs, have ordered patient same dosing in 500 mg tabs  Dr. Parke Simmers

## 2018-04-22 NOTE — Telephone Encounter (Signed)
Called pt, person who answered said we have the wrong number and requested we remove the number from our records. Sunday Spillers, CMA

## 2018-04-28 ENCOUNTER — Other Ambulatory Visit: Payer: Self-pay | Admitting: Family Medicine

## 2018-04-28 DIAGNOSIS — M545 Low back pain, unspecified: Secondary | ICD-10-CM

## 2018-04-28 DIAGNOSIS — G8929 Other chronic pain: Secondary | ICD-10-CM

## 2018-04-28 MED ORDER — TRAMADOL HCL 50 MG PO TABS: ORAL_TABLET | ORAL | 0 refills | Status: DC

## 2018-04-28 NOTE — Telephone Encounter (Signed)
Patient stopped by would like refill on medication Tramadol he only has 3 pills remaining  He is stating needs more than 15 pills.  Due to expense of medication doesn't have a lot of money.Mitchell Page He would like more pills then was subscribed to him. Pharmacy is [facility] on [address].  Patients P#[phone].

## 2018-04-28 NOTE — Telephone Encounter (Signed)
Pt informed of below. Zimmerman Rumple, April D, CMA  

## 2018-05-04 ENCOUNTER — Other Ambulatory Visit: Payer: Self-pay | Admitting: Internal Medicine

## 2018-05-04 DIAGNOSIS — E118 Type 2 diabetes mellitus with unspecified complications: Principal | ICD-10-CM

## 2018-05-04 DIAGNOSIS — E1165 Type 2 diabetes mellitus with hyperglycemia: Secondary | ICD-10-CM

## 2018-05-04 DIAGNOSIS — Z794 Long term (current) use of insulin: Principal | ICD-10-CM

## 2018-05-04 DIAGNOSIS — IMO0002 Reserved for concepts with insufficient information to code with codable children: Secondary | ICD-10-CM

## 2018-05-04 MED ORDER — INSULIN GLARGINE 100 UNIT/ML SOLOSTAR PEN: PEN_INJECTOR | SUBCUTANEOUS | 1 refills | Status: DC

## 2018-05-04 NOTE — Addendum Note (Signed)
Addended by: Nigel Mormon on: 05/04/2018 04:58 PM   Modules accepted: Orders

## 2018-05-14 ENCOUNTER — Other Ambulatory Visit: Payer: Self-pay | Admitting: *Deleted

## 2018-05-14 DIAGNOSIS — K279 Peptic ulcer, site unspecified, unspecified as acute or chronic, without hemorrhage or perforation: Secondary | ICD-10-CM

## 2018-05-14 DIAGNOSIS — K219 Gastro-esophageal reflux disease without esophagitis: Secondary | ICD-10-CM

## 2018-05-19 MED ORDER — OMEPRAZOLE 40 MG PO CPDR: DELAYED_RELEASE_CAPSULE | ORAL | 3 refills | Status: DC

## 2018-05-19 MED ORDER — FAMOTIDINE 20 MG PO TABS: ORAL_TABLET | ORAL | 0 refills | Status: DC

## 2018-05-25 ENCOUNTER — Other Ambulatory Visit: Payer: Self-pay | Admitting: Family Medicine

## 2018-06-03 ENCOUNTER — Other Ambulatory Visit: Payer: Self-pay | Admitting: Internal Medicine

## 2018-06-03 ENCOUNTER — Other Ambulatory Visit: Payer: Self-pay | Admitting: Family Medicine

## 2018-06-03 DIAGNOSIS — G8929 Other chronic pain: Secondary | ICD-10-CM

## 2018-06-03 DIAGNOSIS — E1165 Type 2 diabetes mellitus with hyperglycemia: Secondary | ICD-10-CM

## 2018-06-03 DIAGNOSIS — M545 Low back pain, unspecified: Secondary | ICD-10-CM

## 2018-06-03 DIAGNOSIS — Z794 Long term (current) use of insulin: Principal | ICD-10-CM

## 2018-06-03 DIAGNOSIS — IMO0002 Reserved for concepts with insufficient information to code with codable children: Secondary | ICD-10-CM

## 2018-06-03 DIAGNOSIS — E118 Type 2 diabetes mellitus with unspecified complications: Principal | ICD-10-CM

## 2018-06-23 ENCOUNTER — Other Ambulatory Visit: Payer: Self-pay | Admitting: Family Medicine

## 2018-06-23 DIAGNOSIS — J302 Other seasonal allergic rhinitis: Secondary | ICD-10-CM

## 2018-06-23 DIAGNOSIS — IMO0002 Reserved for concepts with insufficient information to code with codable children: Secondary | ICD-10-CM

## 2018-06-23 DIAGNOSIS — E1165 Type 2 diabetes mellitus with hyperglycemia: Secondary | ICD-10-CM

## 2018-06-23 DIAGNOSIS — E118 Type 2 diabetes mellitus with unspecified complications: Principal | ICD-10-CM

## 2018-06-23 DIAGNOSIS — Z794 Long term (current) use of insulin: Principal | ICD-10-CM

## 2018-06-23 MED ORDER — CETIRIZINE HCL 10 MG PO TABS: 10.0000 mg | ORAL_TABLET | Freq: Every day | ORAL | 0 refills | Status: DC

## 2018-06-23 MED ORDER — INSULIN PEN NEEDLE 31G X 5 MM MISC: 0 refills | Status: DC

## 2018-06-23 NOTE — Telephone Encounter (Signed)
Patient requesting refill on ZYRTEC, and sterile needle. Please give patient a call back.

## 2018-06-28 ENCOUNTER — Telehealth: Payer: Self-pay | Admitting: Family Medicine

## 2018-06-28 ENCOUNTER — Ambulatory Visit (INDEPENDENT_AMBULATORY_CARE_PROVIDER_SITE_OTHER): Payer: Medicare Other | Admitting: Family Medicine

## 2018-06-28 ENCOUNTER — Telehealth: Payer: Self-pay | Admitting: *Deleted

## 2018-06-28 ENCOUNTER — Encounter: Payer: Self-pay | Admitting: Family Medicine

## 2018-06-28 ENCOUNTER — Other Ambulatory Visit: Payer: Self-pay

## 2018-06-28 VITALS — BP 200/92 | HR 80 | Temp 97.4°F | Ht 69.0 in | Wt 166.0 lb

## 2018-06-28 DIAGNOSIS — G8929 Other chronic pain: Secondary | ICD-10-CM

## 2018-06-28 DIAGNOSIS — IMO0002 Reserved for concepts with insufficient information to code with codable children: Secondary | ICD-10-CM

## 2018-06-28 DIAGNOSIS — M545 Low back pain, unspecified: Secondary | ICD-10-CM

## 2018-06-28 DIAGNOSIS — J302 Other seasonal allergic rhinitis: Secondary | ICD-10-CM | POA: Diagnosis not present

## 2018-06-28 DIAGNOSIS — E118 Type 2 diabetes mellitus with unspecified complications: Secondary | ICD-10-CM

## 2018-06-28 DIAGNOSIS — R002 Palpitations: Secondary | ICD-10-CM | POA: Diagnosis not present

## 2018-06-28 DIAGNOSIS — I10 Essential (primary) hypertension: Secondary | ICD-10-CM | POA: Diagnosis not present

## 2018-06-28 DIAGNOSIS — E1165 Type 2 diabetes mellitus with hyperglycemia: Secondary | ICD-10-CM

## 2018-06-28 MED ORDER — CETIRIZINE HCL 10 MG PO TABS: 10.0000 mg | ORAL_TABLET | Freq: Every day | ORAL | 3 refills | Status: DC

## 2018-06-28 MED ORDER — AMLODIPINE BESYLATE 5 MG PO TABS: 5.0000 mg | ORAL_TABLET | Freq: Every day | ORAL | 3 refills | Status: DC

## 2018-06-28 NOTE — Telephone Encounter (Signed)
Pt wants to let the provider know that the tramadol does not work as well as the other medication he used to take.    He is unable to tell me the name and states "he will know its in my chart, and can you have him call me".  Will forward to PCP. Jone Baseman, CMA

## 2018-06-28 NOTE — Progress Notes (Signed)
  Patient Name: Mitchell Page Date of Birth: [birth year] Date of Visit: 06/28/18 PCP: Marthenia Rolling, DO   Chief Complaint: medication issue   Subjective: Mitchell Page is a pleasant 63 y.o. with medical history significant for type 2 diabetes on insulin therapy, hypertension, and BPH presenting today for blood pressure issues. He reports his BP at home has been very high.   The patient reports that he has been out of his amlodipine for about 2 weeks.  He regularly monitors his blood pressure at home.  He typically takes amlodipine 5 mg and losartan 50.  He reports over the last week his morning blood pressures have been 1 77-1 80 systolic.  His systolics have been in the 80s to 90s.  He denies vision changes, headache, chest pain, difficulty breathing or change in his amount of urination.  He reports he overall feels well.   The patient reports he has been checking his blood sugars once per day.  Sometimes this is after he eats.  He denies signs or symptoms of hypo-or hyperglycemia.   The patient reports 1-2 times a day he feels as though his heart skips a beat.  Denies persistent palpitations.  Denies chest pain, dyspnea on exertion or association with exertion.  He reports this occurs at random times throughout the day and is not associated with any particular activity.  He does not regularly monitor his heart rate    ROS: As above ROS  I have reviewed the patient's medical, surgical, family, and social history as appropriate.   Vitals:   06/28/18 1556  BP: (!) 200/92  Pulse: 80  Temp: (!) 97.4 F (36.3 C)   Filed Weights   06/28/18 1556  Weight: 166 lb (75.3 kg)   HEENT: Sclera anicteric. Dentition is moderate. Appears well hydrated. Normal fundoscopic examination.  Neck: Supple Cardiac: Regular rate and rhythm. Normal S1/S2. No murmurs, rubs, or gallops appreciated. Lungs: Clear bilaterally to ascultation.  Skin: Warm, dry Psych: Pleasant and appropriate    Kiron  was seen today for hypertension.  Diagnoses and all orders for this visit:  Type II diabetes mellitus with manifestations, uncontrolled (HCC) -     Hemoglobin A1c  Other seasonal allergic rhinitis, refilled only  -     cetirizine (ZYRTEC) 10 MG tablet; Take 1 tablet (10 mg total) by mouth daily.  Palpitations, intermittent this is been ongoing for some time and occurs 1-2 times a day at most.  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  If becomes  Symptomatic or are worsening in future would recommend an event monitor as well as an echocardiogram.  Check electrolytes today as below  Essential hypertension -     amLODipine (NORVASC) 5 MG tablet; Take 1 tablet (5 mg total) by mouth daily. -     Basic Metabolic Panel - Suspect may need 10 mg of Norvasc. Instructed to take both antihypertensives at night.       Terisa Starr, MD  Family Medicine Teaching Service

## 2018-06-28 NOTE — Patient Instructions (Signed)
It was wonderful to see you today.  Thank you for choosing [facility].   Please call [phone] with any questions about today's appointment.  Please be sure to schedule follow up at the front  desk before you leave today.   Renia Mikelson, MD  Family Medicine    

## 2018-06-29 LAB — BASIC METABOLIC PANEL
BUN/Creatinine Ratio: 9 — ABNORMAL LOW (ref 10–24)
BUN: 7 mg/dL — ABNORMAL LOW (ref 8–27)
CO2: 21 mmol/L (ref 20–29)
Calcium: 9.3 mg/dL (ref 8.6–10.2)
Chloride: 101 mmol/L (ref 96–106)
Creatinine, Ser: 0.82 mg/dL (ref 0.76–1.27)
GFR calc Af Amer: 109 mL/min/{1.73_m2} (ref >59)
GFR calc non Af Amer: 94 mL/min/{1.73_m2} (ref >59)
Glucose: 308 mg/dL — ABNORMAL HIGH (ref 65–99)
Potassium: 4 mmol/L (ref 3.5–5.2)
Sodium: 136 mmol/L (ref 134–144)

## 2018-06-29 LAB — HEMOGLOBIN A1C
Est. average glucose Bld gHb Est-mCnc: 177 mg/dL
Hgb A1c MFr Bld: 7.8 % — ABNORMAL HIGH (ref 4.8–5.6)

## 2018-06-29 NOTE — Telephone Encounter (Signed)
Attempted to call pt.  No answer and unable to LM.  Will create and route letter to admin to mail.  Jone Baseman, CMA

## 2018-07-06 ENCOUNTER — Telehealth: Payer: Self-pay | Admitting: Family Medicine

## 2018-07-06 MED ORDER — TRAMADOL HCL 50 MG PO TABS: ORAL_TABLET | ORAL | 0 refills | Status: DC

## 2018-07-06 NOTE — Addendum Note (Signed)
Addended by: Telford Nab on: 07/06/2018 03:58 PM   Modules accepted: Orders

## 2018-07-06 NOTE — Telephone Encounter (Signed)
Called patient to check in on blood pressure and pain.  Patient's blood pressure this AM 133/89. His BP has ranged from 130-140, diastolic 70- to high 80s. Reports symptoms well controlled, no further questions.  Terisa Starr, MD  Family Medicine Teaching Service

## 2018-07-06 NOTE — Telephone Encounter (Signed)
Pt LVM on nurse line stating the medication he wanted was in fact Tramadol. Not sure where he got another medication from? Please send in if appropriate.

## 2018-07-06 NOTE — Addendum Note (Signed)
Addended by: Steva Colder on: 07/06/2018 02:58 PM   Modules accepted: Orders

## 2018-07-20 ENCOUNTER — Encounter: Payer: Self-pay | Admitting: Family Medicine

## 2018-07-20 ENCOUNTER — Ambulatory Visit (HOSPITAL_COMMUNITY)
Admission: RE | Admit: 2018-07-20 | Discharge: 2018-07-20 | Disposition: A | Discharge status: Home / Self Care | Payer: Medicare Other | Source: Ambulatory Visit | Attending: Family Medicine | Admitting: Family Medicine

## 2018-07-20 ENCOUNTER — Other Ambulatory Visit: Payer: Self-pay

## 2018-07-20 ENCOUNTER — Ambulatory Visit (INDEPENDENT_AMBULATORY_CARE_PROVIDER_SITE_OTHER): Payer: Medicare Other | Admitting: Family Medicine

## 2018-07-20 VITALS — BP 112/63 | HR 78 | Temp 98.7°F | Wt 158.0 lb

## 2018-07-20 DIAGNOSIS — E1165 Type 2 diabetes mellitus with hyperglycemia: Secondary | ICD-10-CM | POA: Diagnosis not present

## 2018-07-20 DIAGNOSIS — E118 Type 2 diabetes mellitus with unspecified complications: Secondary | ICD-10-CM | POA: Diagnosis not present

## 2018-07-20 DIAGNOSIS — R002 Palpitations: Secondary | ICD-10-CM | POA: Diagnosis not present

## 2018-07-20 DIAGNOSIS — IMO0002 Reserved for concepts with insufficient information to code with codable children: Secondary | ICD-10-CM

## 2018-07-20 DIAGNOSIS — R000 Tachycardia, unspecified: Secondary | ICD-10-CM | POA: Insufficient documentation

## 2018-07-20 NOTE — Progress Notes (Signed)
Subjective:   Patient ID: Mitchell Page    DOB: [birth year], 63 y.o. male   MRN: [id number]  Mitchell Page is a 63 y.o. male with a history of HTN, allergic rhinitis, PUD, T2DM on insulin, L rotator cuff, BPH, ED, insomnia here for   Heart Racing - Was seen 2 weeks ago for heart racing and high blood pressure. At that time thought high BP was causing palpitations and was started on amlodipine in addition to his losartan. - Describes the sensations as "feeling like someone comes behind you and scares you." Palpitations will sometimes last a few seconds but other times will last 30 min-1hr. Happens too many times to count during the day. - States sometimes if he moves around, it will go away. If he stays sitting where he is, it will continue and sometimes last for an hour. It happens anytime he reads. - has been going on since last month. Not more frequent than before. - At home today BP 120/80, CBG 115. - sometimes will get dizzy sitting down, when he stands up it goes away. Doesn't always get dizzy with these sensations. - no chest pain, headaches, fever. No one at home is sick. - Sometimes will have to catch his breath during these sensations.  - no known heart problems in family - dad passed 89yo. Mom passed away at 41yo from pregnancy/delivery related complication.  Review of Systems:  Per HPI.  PMFSH, medications and smoking status reviewed.  Objective:   BP 112/63   Pulse 78   Temp 98.7 F (37.1 C) (Oral)   Wt 158 lb (71.7 kg)   SpO2 98%   BMI 23.33 kg/m  Vitals and nursing note reviewed.  General: well nourished, well developed, in no acute distress with non-toxic appearance HEENT: normocephalic, atraumatic, moist mucous membranes CV: regular rate and rhythm without murmurs, rubs, or gallops, no lower extremity edema Lungs: clear to auscultation bilaterally with normal work of breathing Skin: warm, dry, no rashes or lesions Extremities: warm and well perfused, normal  tone MSK: ROM grossly intact, strength intact, gait normal Neuro: Alert and oriented, speech normal  Assessment & Plan:   Palpitations Persistent despite tighter BP control. Unclear etiology. EKG obtained today with moderate evidence of LVH, no abnormal rhythm or PVC/PACs. Normal cardiac auscultation without murmurs or skipped beats, regular rate. Previously with normal TSH although has been >4475yr. No prior h/o anemia. Will obtain TSH, CBC, CMP to check thyroid function, hemoglobin, electrolytes.  Likely has normal heart function and without signs of heart failure but given persistent symptoms, LVH on EKG, and risk factors with age and diabetes, will refer to Cardiology for further workup.  May benefit from holter monitor.  Orders Placed This Encounter  Procedures  . Comprehensive metabolic panel    Order Specific Question:   Has the patient fasted?    Answer:   No  . CBC  . TSH  . Ambulatory referral to Ophthalmology    Referral Priority:   Routine    Referral Type:   Consultation    Referral Reason:   Specialty Services Required    Requested Specialty:   Ophthalmology    Number of Visits Requested:   1  . Ambulatory referral to Cardiology    Referral Priority:   Routine    Referral Type:   Consultation    Referral Reason:   Specialty Services Required    Requested Specialty:   Cardiology    Number of Visits Requested:   1  .  EKG 12-Lead    Ordered by an unspecified provider    No orders of the defined types were placed in this encounter.  Ellwood Dense, DO PGY-2, [facility] 07/20/2018 7:04 PM

## 2018-07-20 NOTE — Assessment & Plan Note (Addendum)
Persistent despite tighter BP control. Unclear etiology. EKG obtained today with moderate evidence of LVH, no abnormal rhythm or PVC/PACs. Normal cardiac auscultation without murmurs or skipped beats, regular rate. Previously with normal TSH although has been >69yr. No prior h/o anemia. Will obtain TSH, CBC, CMP to check thyroid function, hemoglobin, electrolytes.  Likely has normal heart function and without signs of heart failure but given persistent symptoms, LVH on EKG, and risk factors with age and diabetes, will refer to Cardiology for further workup.  May benefit from holter monitor.

## 2018-07-20 NOTE — Patient Instructions (Signed)
It was great to see you!  Our plans for today:  - Your EKG did not show evidence of abnormal heart rhythm. - We are checking some labs today, we will call you or send you a letter if they are abnormal.  - We are referring you to Cardiology.  Take care and seek immediate care sooner if you develop any concerns.   Dr. [facility]

## 2018-07-21 ENCOUNTER — Encounter: Payer: Self-pay | Admitting: Family Medicine

## 2018-07-21 LAB — COMPREHENSIVE METABOLIC PANEL
ALT: 20 IU/L (ref 0–44)
AST: 17 IU/L (ref 0–40)
Albumin/Globulin Ratio: 1.6 (ref 1.2–2.2)
Albumin: 4.6 g/dL (ref 3.8–4.8)
Alkaline Phosphatase: 58 IU/L (ref 39–117)
BUN/Creatinine Ratio: 13 (ref 10–24)
BUN: 12 mg/dL (ref 8–27)
Bilirubin Total: 0.9 mg/dL (ref 0.0–1.2)
CO2: 23 mmol/L (ref 20–29)
Calcium: 9.7 mg/dL (ref 8.6–10.2)
Chloride: 100 mmol/L (ref 96–106)
Creatinine, Ser: 0.93 mg/dL (ref 0.76–1.27)
GFR calc Af Amer: 101 mL/min/{1.73_m2} (ref >59)
GFR calc non Af Amer: 87 mL/min/{1.73_m2} (ref >59)
Globulin, Total: 2.9 g/dL (ref 1.5–4.5)
Glucose: 260 mg/dL — ABNORMAL HIGH (ref 65–99)
Potassium: 4.3 mmol/L (ref 3.5–5.2)
Sodium: 136 mmol/L (ref 134–144)
Total Protein: 7.5 g/dL (ref 6.0–8.5)

## 2018-07-21 LAB — CBC
Hematocrit: 43 % (ref 37.5–51.0)
Hemoglobin: 15.5 g/dL (ref 13.0–17.7)
MCH: 29 pg (ref 26.6–33.0)
MCHC: 36 g/dL — ABNORMAL HIGH (ref 31.5–35.7)
MCV: 81 fL (ref 79–97)
Platelets: 240 10*3/uL (ref 150–450)
RBC: 5.34 x10E6/uL (ref 4.14–5.80)
RDW: 14 % (ref 11.6–15.4)
WBC: 4.6 10*3/uL (ref 3.4–10.8)

## 2018-07-21 LAB — TSH: TSH: 1.99 u[IU]/mL (ref 0.450–4.500)

## 2018-08-10 ENCOUNTER — Other Ambulatory Visit: Payer: Self-pay | Admitting: Family Medicine

## 2018-08-10 DIAGNOSIS — M545 Low back pain, unspecified: Secondary | ICD-10-CM

## 2018-08-10 DIAGNOSIS — G8929 Other chronic pain: Secondary | ICD-10-CM

## 2018-08-10 MED ORDER — TRAMADOL HCL 50 MG PO TABS: ORAL_TABLET | ORAL | 0 refills | Status: DC

## 2018-08-10 NOTE — Telephone Encounter (Signed)
Pt came into office for a refill on his traMADol, pharmacy is telling pt that he can't receive medication until 05/29. Please give pt a call.

## 2018-08-12 ENCOUNTER — Other Ambulatory Visit: Payer: Self-pay | Admitting: Internal Medicine

## 2018-08-12 ENCOUNTER — Other Ambulatory Visit: Payer: Self-pay | Admitting: Family Medicine

## 2018-08-12 DIAGNOSIS — R3912 Poor urinary stream: Secondary | ICD-10-CM

## 2018-08-12 DIAGNOSIS — IMO0002 Reserved for concepts with insufficient information to code with codable children: Secondary | ICD-10-CM

## 2018-08-12 DIAGNOSIS — N401 Enlarged prostate with lower urinary tract symptoms: Secondary | ICD-10-CM

## 2018-08-12 DIAGNOSIS — E1165 Type 2 diabetes mellitus with hyperglycemia: Secondary | ICD-10-CM

## 2018-08-27 ENCOUNTER — Other Ambulatory Visit: Payer: Self-pay | Admitting: Family Medicine

## 2018-08-27 DIAGNOSIS — R05 Cough: Secondary | ICD-10-CM

## 2018-08-27 DIAGNOSIS — IMO0002 Reserved for concepts with insufficient information to code with codable children: Secondary | ICD-10-CM

## 2018-08-27 DIAGNOSIS — E1165 Type 2 diabetes mellitus with hyperglycemia: Secondary | ICD-10-CM

## 2018-08-27 DIAGNOSIS — R059 Cough, unspecified: Secondary | ICD-10-CM

## 2018-08-29 MED ORDER — GABAPENTIN 300 MG PO CAPS: 300.0000 mg | ORAL_CAPSULE | Freq: Three times a day (TID) | ORAL | 0 refills | Status: DC

## 2018-09-20 ENCOUNTER — Other Ambulatory Visit: Payer: Self-pay | Admitting: Family Medicine

## 2018-09-20 DIAGNOSIS — G8929 Other chronic pain: Secondary | ICD-10-CM

## 2018-09-20 NOTE — Telephone Encounter (Signed)
Pt calls and is upset because he states that the pharmacy has been reaching out to [place] for a refill on tramadol  For 5 days.  Explained that the one and only request we have is from today @ 12:45.  He constantly speaks over me and tells me to tell the provider if he "does not have the medication today he will find another provider."  Apologized to patient that there was an issue and again attempted to explain situation and that getting refills today may not be possible, but he continues to interrupt me.    Will forward to MD. Christen Bame, CMA

## 2018-10-21 NOTE — Telephone Encounter (Signed)
Finished

## 2018-11-08 NOTE — Progress Notes (Signed)
Cardiology Office Note:   Date:  11/09/2018  NAME:  Mitchell Page    MRN: [id number] DOB:  [birth year]   PCP:  Sherene Sires, DO  Cardiologist:  Evalina Field, MD  Electrophysiologist:  None   Referring MD: Kinnie Feil, MD   Chief Complaint  Patient presents with  . Palpitations   History of Present Illness:   Mitchell Page is a 63 y.o. male with a hx of hypertension, hyperlipidemia, diabetes who is being seen today for the evaluation of palpitations at the request of Andrena Mews, MD. he reports he was seen by his primary care physician nearly 1 month ago for palpitations.  He reports at that time he had nearly 1 week of extra beats in his chest that would come and go every 30 to 40 minutes, last seconds, and resolved without intervention.  He reports no triggers for these events and denies any associated chest pain, shortness of breath, syncope.  He states that the palpitations have resolved and has had no further recurrences since that event by his primary care physician.  He expresses interest to return to his heart is healthy.   He reports no prior history of myocardial infarction, heart failure, stroke.  He is a never smoker.  Cardiovascular risk factors include diabetes and hypertension.  His blood pressure is elevated today however he reports he just took his blood pressure medications.  Review of lab work from his PCP demonstrate a normal thyroid (TSH 1.99).  His diabetes is well controlled with an A1c of 7.8.  Most recent LDL cholesterol from 2019 is 88.  Past Medical History: Past Medical History:  Diagnosis Date  . Allergic rhinitis   . Chronic headache   . Diabetes mellitus   . GERD (gastroesophageal reflux disease)   . Hyperlipidemia   . Hypertension   . Illiteracy and low-level literacy    Unclear of his literacy  in his native tongue, however, he is unable to read in [place]  . Lower back pain    w/ R LE sciatica s/p laminectomy in 2008. MRI- 04/2008     Past Surgical History: Past Surgical History:  Procedure Laterality Date  . LUMBAR LAMINECTOMY  2008   l4-5  . Repair of left arm fracture      Current Medications: Current Meds  Medication Sig  . ACCU-CHEK FASTCLIX LANCETS MISC Use to test blood glucose up to 3 times daily. Dx E11.8, E11.65, Z79.4  . amLODipine (NORVASC) 5 MG tablet Take 1 tablet (5 mg total) by mouth daily.  Marland Kitchen atorvastatin (LIPITOR) 40 MG tablet Take 1 tablet (40 mg total) by mouth daily.  . Blood Glucose Monitoring Suppl (ACCU-CHEK GUIDE) w/Device KIT 1 kit by Does not apply route daily. Use to test blood glucose up to 3 times daily. Dx E11.8, E11.65, Z79.4  . cetirizine (ZYRTEC) 10 MG tablet Take 1 tablet (10 mg total) by mouth daily.  . famotidine (PEPCID) 20 MG tablet TAKE 1 TABLET BY MOUTH TWICE DAILY AS NEEDED FOR HEARTBURN OR INDIGESTION  . fluticasone (FLONASE) 50 MCG/ACT nasal spray Place 2 sprays into both nostrils daily.  Marland Kitchen gabapentin (NEURONTIN) 300 MG capsule Take 1 capsule (300 mg total) by mouth 3 (three) times daily.  Marland Kitchen glucose blood (ACCU-CHEK GUIDE) test strip Use to test blood glucose up to 3 times daily. Dx E11.8, E11.65, Z79.4  . ibuprofen (ADVIL,MOTRIN) 800 MG tablet Take 1 tablet (800 mg total) by mouth every 8 (eight) hours as needed.  Marland Kitchen  Insulin Glargine (LANTUS SOLOSTAR) 100 UNIT/ML Solostar Pen INJECT 24 UNITS EVERY EVENING  . Insulin Pen Needle (B-D UF III MINI PEN NEEDLES) 31G X 5 MM MISC USE AS DIRECTED FOR ONCE DAILY INSULIN INJECTION  . INVOKANA 300 MG TABS tablet TAKE 1 TABLET(300 MG) BY MOUTH DAILY BEFORE BREAKFAST  . Lancet Devices (ACCU-CHEK SOFTCLIX) lancets Use to check blood sugar twice a day  . losartan (COZAAR) 50 MG tablet TAKE 1 TABLET BY MOUTH DAILY  . metFORMIN (GLUCOPHAGE) 500 MG tablet TAKE 2 TABLETS(1000 MG) BY MOUTH TWICE DAILY WITH A MEAL  . omeprazole (PRILOSEC) 40 MG capsule Take daily 30 minutes before breakfast.  . sildenafil (REVATIO) 20 MG tablet Take 80 mg (4  tablets) no more than once daily as needed prior to sexual activity  . sodium chloride (OCEAN) 0.65 % SOLN nasal spray Place 1 spray into both nostrils as needed for congestion.  . tamsulosin (FLOMAX) 0.4 MG CAPS capsule TAKE ONE CAPSULE BY MOUTH DAILY     Allergies:    Patient has no known allergies.   Social History: Social History   Socioeconomic History  . Marital status: Married    Spouse name: Not on file  . Number of children: Not on file  . Years of education: Not on file  . Highest education level: Not on file  Occupational History  . Not on file  Social Needs  . Financial resource strain: Not on file  . Food insecurity    Worry: Not on file    Inability: Not on file  . Transportation needs    Medical: Not on file    Non-medical: Not on file  Tobacco Use  . Smoking status: Never Smoker  . Smokeless tobacco: Never Used  Substance and Sexual Activity  . Alcohol use: No    Alcohol/week: 0.0 standard drinks  . Drug use: No  . Sexual activity: Not on file  Lifestyle  . Physical activity    Days per week: Not on file    Minutes per session: Not on file  . Stress: Not on file  Relationships  . Social Herbalist on phone: Not on file    Gets together: Not on file    Attends religious service: Not on file    Active member of club or organization: Not on file    Attends meetings of clubs or organizations: Not on file    Relationship status: Not on file  Other Topics Concern  . Not on file  Social History Narrative   Immigrated from [place] in 1990.   Married, has 3 children.   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   Uninsured.   Highest education - middle school.   No smoking, alcohol or illicit drug use.      Cannot read in [place].    Family History: The patient's family history includes Heart disease in his father.  ROS:   All other ROS reviewed and negative. Pertinent  positives noted in the HPI.     EKGs/Labs/Other Studies Reviewed:   The following studies were personally reviewed by me today: Labs obtained by primary care physician (03/12/2018) total cholesterol 160 HDL 60 LDL 88 triglycerides 62 A1c 7.8 creatinine 0.93  EKG:  EKG is ordered today.  The ekg ordered today demonstrates normal sinus rhythm, heart rate 68, normal intervals, no ST-T changes to suggest ischemia, no prior infarction and was  personally reviewed by me.   Recent Labs: 07/20/2018: ALT 20; BUN 12; Creatinine, Ser 0.93; Hemoglobin 15.5; Platelets 240; Potassium 4.3; Sodium 136; TSH 1.990   Recent Lipid Panel    Component Value Date/Time   CHOL 160 03/12/2018 1132   TRIG 62 03/12/2018 1132   HDL 60 03/12/2018 1132   CHOLHDL 2.7 03/12/2018 1132   CHOLHDL 2.7 07/07/2014 1621   VLDL 10 07/07/2014 1621   LDLCALC 88 03/12/2018 1132    Physical Exam:   VS:  BP (!) 150/96 (BP Location: Left Arm, Patient Position: Sitting, Cuff Size: Normal)   Pulse 68   Temp (!) 97 F (36.1 C)   Ht '5\' 9"'$  (1.753 m)   Wt 162 lb (73.5 kg)   BMI 23.92 kg/m    Wt Readings from Last 3 Encounters:  11/09/18 162 lb (73.5 kg)  07/20/18 158 lb (71.7 kg)  06/28/18 166 lb (75.3 kg)    General: Well nourished, well developed, in no acute distress Heart: Atraumatic, normal size  Eyes: PEERLA, EOMI  Neck: Supple, no JVD Endocrine: No thryomegaly Cardiac: Normal S1, S2; 2 out of 6 holosystolic murmur  lungs: Clear to auscultation bilaterally, no wheezing, rhonchi or rales  Abd: Soft, nontender, no hepatomegaly  Ext: No edema, pulses 2+ Musculoskeletal: No deformities, BUE and BLE strength normal and equal Skin: Warm and dry, no rashes   Neuro: Alert and oriented to person, place, time, and situation, CNII-XII grossly intact, no focal deficits  Psych: Normal mood and affect   ASSESSMENT:   NAME@ is a 63 y.o. male who presents for the following: 1. Palpitations   2. Murmur, cardiac   3. Essential  hypertension   4. Mixed hyperlipidemia     PLAN:   1. Palpitations 2. Murmur, cardiac -Ports a history of palpitations at have since resolved.  The symptoms lasted 1 week and has had no further recurrence.  I see no reason to order a monitor at this time.  He does have a 2 out of 6 holosystolic murmur that does not vary with respiration concerning for mitral valve disease.  I think it is reasonable to obtain an echocardiogram to ensure he does not have any underlying valvular heart disease or atrial enlargement to possibly explain some of the palpitations he had.  I do suspect his heart will be quite normal given his ECG is extremely unremarkable.  3. Essential hypertension -Blood pressure a bit elevated today however he did not take his medications -Review of blood pressure shows normal values and will continue current regimen  4. Mixed hyperlipidemia -LDL controlled on statin therapy at 88   Disposition: Return in about 3 months (around 02/09/2019).  Medication Adjustments/Labs and Tests Ordered: Current medicines are reviewed at length with the patient today.  Concerns regarding medicines are outlined above.  Orders Placed This Encounter  Procedures  . EKG 12-Lead  . ECHOCARDIOGRAM COMPLETE   No orders of the defined types were placed in this encounter.   Patient Instructions  Medication Instructions:   NO CHANGES    Lab work:  NOT NEEDED  Testing/Procedures: Will be schedule at [facility] has requested that you have an echocardiogram. Echocardiography is a painless test that uses sound waves to create images of your heart. It provides your doctor with information about the size and shape of your heart and how well your heart's chambers and valves are working. This procedure takes approximately one hour. There are no restrictions  for this procedure.    Follow-Up: At [facility], you and your health needs are our priority.  As  part of our continuing mission to provide you with exceptional heart care, we have created designated Provider Care Teams.  These Care Teams include your primary Cardiologist (physician) and Advanced Practice Providers (APPs -  Physician Assistants and Nurse Practitioners) who all work together to provide you with the care you need, when you need it. . Dr Audie Box recommends that you schedule a follow-up appointment in: 3 MONTH S- NOV 2020     Any Other Special Instructions Will Be Listed Below.N/A    Signed, [place] T. Audie Box, [place]  [address] [place],  [address] [phone]  11/09/2018 1:54 PM

## 2018-11-09 ENCOUNTER — Encounter: Payer: Self-pay | Admitting: Cardiovascular Disease

## 2018-11-09 ENCOUNTER — Ambulatory Visit (INDEPENDENT_AMBULATORY_CARE_PROVIDER_SITE_OTHER): Payer: Medicare Other | Admitting: Cardiovascular Disease

## 2018-11-09 ENCOUNTER — Other Ambulatory Visit: Payer: Self-pay | Admitting: Family Medicine

## 2018-11-09 ENCOUNTER — Other Ambulatory Visit: Payer: Self-pay

## 2018-11-09 VITALS — BP 150/96 | HR 68 | Temp 97.0°F | Ht 69.0 in | Wt 162.0 lb

## 2018-11-09 DIAGNOSIS — M545 Low back pain, unspecified: Secondary | ICD-10-CM

## 2018-11-09 DIAGNOSIS — E782 Mixed hyperlipidemia: Secondary | ICD-10-CM

## 2018-11-09 DIAGNOSIS — I10 Essential (primary) hypertension: Secondary | ICD-10-CM | POA: Diagnosis not present

## 2018-11-09 DIAGNOSIS — R002 Palpitations: Secondary | ICD-10-CM

## 2018-11-09 DIAGNOSIS — G8929 Other chronic pain: Secondary | ICD-10-CM

## 2018-11-09 DIAGNOSIS — R011 Cardiac murmur, unspecified: Secondary | ICD-10-CM | POA: Diagnosis not present

## 2018-11-09 NOTE — Patient Instructions (Addendum)
Medication Instructions:   NO CHANGES    Lab work:  NOT NEEDED  Testing/Procedures: Will be schedule at [facility] has requested that you have an echocardiogram. Echocardiography is a painless test that uses sound waves to create images of your heart. It provides your doctor with information about the size and shape of your heart and how well your heart's chambers and valves are working. This procedure takes approximately one hour. There are no restrictions for this procedure.    Follow-Up: At [facility], you and your health needs are our priority.  As part of our continuing mission to provide you with exceptional heart care, we have created designated Provider Care Teams.  These Care Teams include your primary Cardiologist (physician) and Advanced Practice Providers (APPs -  Physician Assistants and Nurse Practitioners) who all work together to provide you with the care you need, when you need it. . Dr Audie Box recommends that you schedule a follow-up appointment in: 3 MONTH S- NOV 2020     Any Other Special Instructions Will Be Listed Below.N/A

## 2018-11-10 ENCOUNTER — Ambulatory Visit (HOSPITAL_COMMUNITY): Payer: Medicare Other | Attending: Cardiovascular Disease

## 2018-11-10 ENCOUNTER — Other Ambulatory Visit: Payer: Self-pay | Admitting: Family Medicine

## 2018-11-10 DIAGNOSIS — R002 Palpitations: Secondary | ICD-10-CM | POA: Diagnosis present

## 2018-11-10 DIAGNOSIS — R011 Cardiac murmur, unspecified: Secondary | ICD-10-CM | POA: Diagnosis not present

## 2018-11-10 DIAGNOSIS — N401 Enlarged prostate with lower urinary tract symptoms: Secondary | ICD-10-CM

## 2018-11-11 ENCOUNTER — Telehealth: Payer: Self-pay | Admitting: Family Medicine

## 2018-11-11 NOTE — Telephone Encounter (Signed)
The patient requested a PCP switch from Dr. Criss Rosales to Dr. Owens Shark because he has issues with his Tramadol not getting filled in a timely fashion. He stated that he likes Dr. Criss Rosales and that he treats him with respect. However, he has had multiple instances where he could not get his Tramadol refilled as quickly as possible.  I advised him that typically providers are allowed 2-3 days to respond to medication refill requests. I also informed him that per our policy, we don't make PCP switch from a resident to a faculty and vice versa. He is aware that he is only allowed a one-time switch, after which he will not be able to request a new PCP switch.  He prefers to stay with Dr. Criss Rosales since he is soft-spoken and respectful. However, he requested that he improve on responding to his Tramadol refill request promptly in the future.  I have discussed the issue with Dr. Criss Rosales as well, and he does not mind to remain this patient's PCP.

## 2018-12-10 ENCOUNTER — Other Ambulatory Visit: Payer: Self-pay | Admitting: Family Medicine

## 2018-12-10 DIAGNOSIS — M545 Low back pain, unspecified: Secondary | ICD-10-CM

## 2018-12-10 DIAGNOSIS — G8929 Other chronic pain: Secondary | ICD-10-CM

## 2019-01-03 ENCOUNTER — Other Ambulatory Visit: Payer: Self-pay | Admitting: Family Medicine

## 2019-01-03 DIAGNOSIS — IMO0002 Reserved for concepts with insufficient information to code with codable children: Secondary | ICD-10-CM

## 2019-01-03 DIAGNOSIS — E118 Type 2 diabetes mellitus with unspecified complications: Secondary | ICD-10-CM

## 2019-01-03 DIAGNOSIS — E1165 Type 2 diabetes mellitus with hyperglycemia: Secondary | ICD-10-CM

## 2019-01-18 ENCOUNTER — Other Ambulatory Visit: Payer: Self-pay

## 2019-01-18 DIAGNOSIS — G8929 Other chronic pain: Secondary | ICD-10-CM

## 2019-01-18 MED ORDER — TRAMADOL HCL 50 MG PO TABS: ORAL_TABLET | ORAL | 0 refills | Status: DC

## 2019-01-31 ENCOUNTER — Other Ambulatory Visit: Payer: Self-pay | Admitting: *Deleted

## 2019-01-31 DIAGNOSIS — R3912 Poor urinary stream: Secondary | ICD-10-CM

## 2019-01-31 DIAGNOSIS — N401 Enlarged prostate with lower urinary tract symptoms: Secondary | ICD-10-CM

## 2019-01-31 MED ORDER — TAMSULOSIN HCL 0.4 MG PO CAPS: ORAL_CAPSULE | ORAL | 0 refills | Status: DC

## 2019-02-02 ENCOUNTER — Ambulatory Visit: Payer: Medicare Other | Admitting: Cardiovascular Disease

## 2019-02-03 ENCOUNTER — Other Ambulatory Visit: Payer: Self-pay

## 2019-02-03 DIAGNOSIS — IMO0002 Reserved for concepts with insufficient information to code with codable children: Secondary | ICD-10-CM

## 2019-02-03 DIAGNOSIS — E1165 Type 2 diabetes mellitus with hyperglycemia: Secondary | ICD-10-CM

## 2019-02-05 MED ORDER — GABAPENTIN 300 MG PO CAPS: 300.0000 mg | ORAL_CAPSULE | Freq: Three times a day (TID) | ORAL | 0 refills | Status: DC

## 2019-02-23 ENCOUNTER — Ambulatory Visit (INDEPENDENT_AMBULATORY_CARE_PROVIDER_SITE_OTHER): Payer: Medicare Other | Admitting: Family Medicine

## 2019-02-23 ENCOUNTER — Encounter: Payer: Self-pay | Admitting: Family Medicine

## 2019-02-23 ENCOUNTER — Other Ambulatory Visit: Payer: Self-pay

## 2019-02-23 VITALS — BP 132/64 | HR 68 | Ht 69.0 in | Wt 168.4 lb

## 2019-02-23 DIAGNOSIS — Z794 Long term (current) use of insulin: Secondary | ICD-10-CM

## 2019-02-23 DIAGNOSIS — J301 Allergic rhinitis due to pollen: Secondary | ICD-10-CM | POA: Diagnosis not present

## 2019-02-23 DIAGNOSIS — E1165 Type 2 diabetes mellitus with hyperglycemia: Secondary | ICD-10-CM | POA: Diagnosis not present

## 2019-02-23 DIAGNOSIS — M545 Low back pain: Secondary | ICD-10-CM

## 2019-02-23 DIAGNOSIS — E118 Type 2 diabetes mellitus with unspecified complications: Secondary | ICD-10-CM

## 2019-02-23 DIAGNOSIS — G8929 Other chronic pain: Secondary | ICD-10-CM

## 2019-02-23 DIAGNOSIS — IMO0002 Reserved for concepts with insufficient information to code with codable children: Secondary | ICD-10-CM

## 2019-02-23 DIAGNOSIS — Z23 Encounter for immunization: Secondary | ICD-10-CM

## 2019-02-23 LAB — POCT GLYCOSYLATED HEMOGLOBIN (HGB A1C): HbA1c, POC (controlled diabetic range): 8.2 % — AB (ref 0.0–7.0)

## 2019-02-23 MED ORDER — FLUTICASONE PROPIONATE 50 MCG/ACT NA SUSP: 2.0000 | Freq: Every day | NASAL | 2 refills | Status: DC

## 2019-02-23 MED ORDER — TRAMADOL HCL 50 MG PO TABS: ORAL_TABLET | ORAL | 0 refills | Status: DC

## 2019-02-23 MED ORDER — LANTUS SOLOSTAR 100 UNIT/ML ~~LOC~~ SOPN: PEN_INJECTOR | SUBCUTANEOUS | 1 refills | Status: DC

## 2019-02-23 NOTE — Progress Notes (Signed)
    Subjective:  Mitchell Page is a 63 y.o. male who presents to the [facility] today with a chief complaint of diabetes follow-up.   HPI: Back pain Chronic unchanging back pain with no radiculopathy.  Patient uses 1 tramadol per day and says this is sufficient, he does not want more.  Type II diabetes mellitus with manifestations, uncontrolled (HCC) A1c increased to 8.4 from prior 7.2.  We discussed that the need to control this better but patient also reports having multiple low sugar events into the 60s given that he is on insulin.    Allergic rhinitis Occasional intermittent seasonal rhinitis  Objective:  Physical Exam: BP 132/64   Pulse 68   Ht 5\' 9"  (1.753 m)   Wt 168 lb 6 oz (76.4 kg)   SpO2 98%   BMI 24.86 kg/m   Gen: NAD, pleasant and conversing comfortably CV: RRR with no murmurs appreciated Pulm: NWOB, CTAB with no crackles, wheezes, or rhonchi GI: Normal bowel sounds present. Soft, Nontender, Nondistended. MSK: no edema, cyanosis, or clubbing noted Skin: warm, dry Neuro: grossly normal, moves all extremities Psych: Normal affect and thought content  Results for orders placed or performed in visit on 02/23/19 (from the past 72 hour(s))  HgB A1c     Status: Abnormal   Collection Time: 02/23/19 10:45 AM  Result Value Ref Range   Hemoglobin A1C     HbA1c POC (<> result, manual entry)     HbA1c, POC (prediabetic range)     HbA1c, POC (controlled diabetic range) 8.2 (A) 0.0 - 7.0 %     Assessment/Plan:  Need for immunization against influenza Patient consents to flu shot, no symptoms  Back pain Chronic unchanging back pain with no radiculopathy.  Patient uses 1 tramadol per day and says this is sufficient, he does not want more.    Type II diabetes mellitus with manifestations, uncontrolled (HCC) A1c increased to 8.4 from prior 7.2.  We discussed that the need to control this better but patient also reports having multiple low sugar events into the 60s given  that he is on insulin.  We cannot increase his insulin with his low doses, he does not have his blood sugar logs with him.  Reducing Lantus to 20 units/day from 24, instructing patient to record blood sugars for the next week and then come back in for follow-up visit so that we can recheck.  Went over emergency precautions  Allergic rhinitis Occasional intermittent seasonal rhinitis, refill chronic fluticasone.   Sherene Sires, DO FAMILY MEDICINE RESIDENT - PGY3 02/25/2019 1:32 PM

## 2019-02-23 NOTE — Patient Instructions (Signed)
It was a pleasure to see you today! Thank you for choosing [facility] for your primary care. Mitchell Page was seen for diabetes checkup. Come back to the clinic to see the pharmacy team after you record a week worth of blood sugars.  Today we lowered your insulin dose to 20 units/day because he said he had some low blood sugars in the 60s.  Remember it is important to take your insulin at approximately the same time if he can.  Also important to start writing down your blood sugar so that we can see what your readings are and if you could also record some information such as if you took this reading before or after you ate as well as when you took your insulin each day.  After you do this for about a week please come back and see the pharmacy team for discussion of medication adjustment.   Please bring all your medications to every doctors visit   Sign up for My Chart to have easy access to your labs results, and communication with your Primary care physician.     Please check-out at the front desk before leaving the clinic.     Best,  Dr. Sherene Sires FAMILY MEDICINE RESIDENT - PGY3 02/23/2019 11:02 AM

## 2019-02-25 ENCOUNTER — Encounter: Payer: Self-pay | Admitting: Family Medicine

## 2019-02-25 NOTE — Assessment & Plan Note (Signed)
Patient consents to flu shot, no symptoms 

## 2019-02-25 NOTE — Assessment & Plan Note (Signed)
Occasional intermittent seasonal rhinitis, refill chronic fluticasone.

## 2019-02-25 NOTE — Assessment & Plan Note (Signed)
A1c increased to 8.4 from prior 7.2.  We discussed that the need to control this better but patient also reports having multiple low sugar events into the 60s given that he is on insulin.  We cannot increase his insulin with his low doses, he does not have his blood sugar logs with him.  Reducing Lantus to 20 units/day from 24, instructing patient to record blood sugars for the next week and then come back in for follow-up visit so that we can recheck.  Went over emergency precautions

## 2019-02-25 NOTE — Assessment & Plan Note (Signed)
Chronic unchanging back pain with no radiculopathy.  Patient uses 1 tramadol per day and says this is sufficient, he does not want more.

## 2019-03-03 ENCOUNTER — Other Ambulatory Visit: Payer: Self-pay

## 2019-03-03 ENCOUNTER — Ambulatory Visit (INDEPENDENT_AMBULATORY_CARE_PROVIDER_SITE_OTHER): Payer: Medicare Other | Admitting: Family Medicine

## 2019-03-03 VITALS — BP 124/72 | HR 79 | Wt 164.0 lb

## 2019-03-03 DIAGNOSIS — E1165 Type 2 diabetes mellitus with hyperglycemia: Secondary | ICD-10-CM

## 2019-03-03 DIAGNOSIS — E118 Type 2 diabetes mellitus with unspecified complications: Secondary | ICD-10-CM

## 2019-03-03 DIAGNOSIS — IMO0002 Reserved for concepts with insufficient information to code with codable children: Secondary | ICD-10-CM

## 2019-03-03 MED ORDER — DEXCOM G6 SENSOR MISC: 1.0000 [IU] | Freq: Once | 1 refills | Status: AC

## 2019-03-03 MED ORDER — DEXCOM G6 TRANSMITTER MISC: 1.0000 [IU] | Freq: Once | 1 refills | Status: AC

## 2019-03-03 NOTE — Progress Notes (Addendum)
    Subjective:  Mitchell Page is a 63 y.o. male who presents to the [facility] today with a chief complaint of hope of getting skin glucometer that would not require needlestick  HPI: Patient said he has a friend who showed them a glucometer they have that does not require needlestick, he said that he has been sticking his finger for 30 years and would like to find a way to get around that.  He wants to know if we are willing to try and order this other type of glucometer.  Has no other complaints and no other request for med refills would just like to discuss this issue  Objective:  Physical Exam: BP 124/72   Pulse 79   Wt 164 lb (74.4 kg)   SpO2 97%   BMI 24.22 kg/m   Gen: NAD, pleasant and conversing comfortably CV: RRR with no murmurs appreciated Pulm: NWOB, CTAB with no crackles, wheezes, or rhonchi MSK: no edema, cyanosis, or clubbing noted Skin: warm, dry Neuro: grossly normal, moves all extremities Psych: Normal affect and thought content  No results found for this or any previous visit (from the past 72 hour(s)).   Assessment/Plan:  Type II diabetes mellitus with manifestations, uncontrolled ([place]) Patient requesting glucometer that would not require needle sticks.  Discussed with our patient care coordinator, Christen Bame, who stated that we are occasionally able to get a Dexcom glucometer approved and that we can start that process.  Orders are placed.  We will also route to pharmacy in case they have other suggestion or able to help improve likelihood of success for approval.   Sherene Sires, DO [place] - PGY3 03/07/2019 9:15 AM

## 2019-03-03 NOTE — Patient Instructions (Signed)
It was a pleasure to see you today! Thank you for choosing [facility] for your primary care. Mitchell Page was seen for diabetes follow-up. Come back to the clinic in 3 months that we can check your A1c.  Today we talked about your goal of getting a Dexcom diabetes monitored that would not require you to stick your finger every time.  This can be difficult to get approved but we are going to try for you.  I am going to communicate with the pharmacy team to see if they are able to assist with the process and will let you know if there is anything else that we can do or once a final decision is made by Medicare.  Please continue with the current dose of 20 units of long-acting insulin per morning.  I think this did a good job of avoiding the low blood sugars of the 60s and 70s that you said you have been having.  You still have 1 or 2 days a week up around 200 but your lowest being 100 is a much safer situation right now.   Please bring all your medications to every doctors visit   Sign up for My Chart to have easy access to your labs results, and communication with your Primary care physician.     Please check-out at the front desk before leaving the clinic.     Best,  Dr. Sherene Sires FAMILY MEDICINE RESIDENT - PGY3 03/03/2019 2:26 PM

## 2019-03-05 NOTE — Assessment & Plan Note (Signed)
Patient requesting glucometer that would not require needle sticks.  Discussed with our patient care coordinator, Christen Bame, who stated that we are occasionally able to get a Dexcom glucometer approved and that we can start that process.  Orders are placed.  We will also route to pharmacy in case they have other suggestion or able to help improve likelihood of success for approval.

## 2019-03-24 ENCOUNTER — Other Ambulatory Visit: Payer: Self-pay | Admitting: Family Medicine

## 2019-03-24 DIAGNOSIS — G8929 Other chronic pain: Secondary | ICD-10-CM

## 2019-03-24 DIAGNOSIS — M545 Low back pain, unspecified: Secondary | ICD-10-CM

## 2019-03-25 ENCOUNTER — Other Ambulatory Visit: Payer: Self-pay | Admitting: Family Medicine

## 2019-03-25 DIAGNOSIS — G8929 Other chronic pain: Secondary | ICD-10-CM

## 2019-04-12 ENCOUNTER — Telehealth: Payer: Medicare Other | Admitting: Family Medicine

## 2019-04-12 ENCOUNTER — Ambulatory Visit (INDEPENDENT_AMBULATORY_CARE_PROVIDER_SITE_OTHER): Payer: Medicare Other | Admitting: Family Medicine

## 2019-04-12 ENCOUNTER — Encounter: Payer: Self-pay | Admitting: Family Medicine

## 2019-04-12 ENCOUNTER — Other Ambulatory Visit: Payer: Self-pay

## 2019-04-12 DIAGNOSIS — IMO0002 Reserved for concepts with insufficient information to code with codable children: Secondary | ICD-10-CM

## 2019-04-12 DIAGNOSIS — K219 Gastro-esophageal reflux disease without esophagitis: Secondary | ICD-10-CM

## 2019-04-12 DIAGNOSIS — N401 Enlarged prostate with lower urinary tract symptoms: Secondary | ICD-10-CM | POA: Diagnosis not present

## 2019-04-12 DIAGNOSIS — J302 Other seasonal allergic rhinitis: Secondary | ICD-10-CM

## 2019-04-12 DIAGNOSIS — I10 Essential (primary) hypertension: Secondary | ICD-10-CM

## 2019-04-12 DIAGNOSIS — R0789 Other chest pain: Secondary | ICD-10-CM

## 2019-04-12 DIAGNOSIS — E118 Type 2 diabetes mellitus with unspecified complications: Secondary | ICD-10-CM

## 2019-04-12 DIAGNOSIS — R079 Chest pain, unspecified: Secondary | ICD-10-CM

## 2019-04-12 DIAGNOSIS — Z794 Long term (current) use of insulin: Secondary | ICD-10-CM

## 2019-04-12 DIAGNOSIS — M545 Low back pain, unspecified: Secondary | ICD-10-CM

## 2019-04-12 DIAGNOSIS — R3912 Poor urinary stream: Secondary | ICD-10-CM

## 2019-04-12 DIAGNOSIS — E1165 Type 2 diabetes mellitus with hyperglycemia: Secondary | ICD-10-CM

## 2019-04-12 DIAGNOSIS — G8929 Other chronic pain: Secondary | ICD-10-CM

## 2019-04-12 MED ORDER — AMLODIPINE BESYLATE 5 MG PO TABS: 5.0000 mg | ORAL_TABLET | Freq: Every day | ORAL | 3 refills | Status: DC

## 2019-04-12 MED ORDER — GABAPENTIN 300 MG PO CAPS: 300.0000 mg | ORAL_CAPSULE | Freq: Two times a day (BID) | ORAL | 0 refills | Status: DC

## 2019-04-12 MED ORDER — CANAGLIFLOZIN 300 MG PO TABS: 300.0000 mg | ORAL_TABLET | Freq: Every day | ORAL | 0 refills | Status: DC

## 2019-04-12 MED ORDER — LOSARTAN POTASSIUM 50 MG PO TABS: 50.0000 mg | ORAL_TABLET | Freq: Every day | ORAL | 3 refills | Status: DC

## 2019-04-12 MED ORDER — LANTUS SOLOSTAR 100 UNIT/ML ~~LOC~~ SOPN: PEN_INJECTOR | SUBCUTANEOUS | 36 refills | Status: DC

## 2019-04-12 MED ORDER — TAMSULOSIN HCL 0.4 MG PO CAPS: ORAL_CAPSULE | ORAL | 3 refills | Status: DC

## 2019-04-12 MED ORDER — CYCLOBENZAPRINE HCL 10 MG PO TABS: 10.0000 mg | ORAL_TABLET | Freq: Three times a day (TID) | ORAL | 0 refills | Status: DC | PRN

## 2019-04-12 MED ORDER — BACLOFEN 10 MG PO TABS: 10.0000 mg | ORAL_TABLET | Freq: Three times a day (TID) | ORAL | 0 refills | Status: DC

## 2019-04-12 MED ORDER — CETIRIZINE HCL 10 MG PO TABS: 10.0000 mg | ORAL_TABLET | Freq: Every day | ORAL | 3 refills | Status: DC

## 2019-04-12 MED ORDER — METFORMIN HCL 500 MG PO TABS: ORAL_TABLET | ORAL | 3 refills | Status: DC

## 2019-04-12 MED ORDER — OMEPRAZOLE 40 MG PO CPDR: DELAYED_RELEASE_CAPSULE | ORAL | 3 refills | Status: DC

## 2019-04-12 NOTE — Patient Instructions (Addendum)
Mr Mitchell Page, Mitchell Page have had a cough with chest pain with runny nose which are symptoms of Covid. Please get a Covid test as soon as possible by texting COVID to arrange test/[email or web address] to easily make an appointment online.  You can also call [phone] to get insistence to make an appointment.  I have refilled your medications for you tramadol.  Please contact your PCP when you run out of tramadol.  Think that your back pain is musculoskeletal following the drive to [place], I have prescribed a medication called baclofen for your back pain.  These come and see [place] if your back pain is not improved.    Best wishes  Dr. Allena Katz

## 2019-04-15 ENCOUNTER — Telehealth: Payer: Self-pay

## 2019-04-15 DIAGNOSIS — R0789 Other chest pain: Secondary | ICD-10-CM | POA: Insufficient documentation

## 2019-04-15 DIAGNOSIS — R079 Chest pain, unspecified: Secondary | ICD-10-CM | POA: Insufficient documentation

## 2019-04-15 NOTE — Assessment & Plan Note (Signed)
Back pain likely musculoskeletal in origin, worsened by long drive >86 hours to [place]. Worsened on periods of immobilization. Red flags excluded and low suspicion for cauda equina as no urinary/fecal incontinence or saddle anesthesia and normal strength in lower extremeties. Considered Prostate Ca given pt's age however pt appears well, no weight loss or bone pain. No indication for further imaging today. Offered pt physiotherapy but declined. -Recommended staying active and avoiding prolonged periods of immobilization which would worsen back pain -Prescribed Baclofen  -Heat/ice pack therapy -Continue Tramadol at 50mg  once daily

## 2019-04-15 NOTE — Telephone Encounter (Signed)
Pt calling nurse line with concerns related to receiving 3 month supply of medications. Called and spoke with pharmacist and was able to confirm that patient is receiving 3 month supply, but will now have to work with [employer].   Veronda Prude, RN

## 2019-04-15 NOTE — Assessment & Plan Note (Signed)
Chest pain likely costochondritis given pt has URTI symptoms and pain is only on coughing. Denies chest pain on exertion or at rest and non radiating so low suspicion for angina/cardiac origin. Recommended pt get tested for COVID and provided contact numbers to arrange test. Precautions given to go to the ER if chest pain worsens and not improving.

## 2019-04-15 NOTE — Progress Notes (Signed)
   Subjective:    Patient ID: Mitchell Page, male    DOB: [birth year], 64 y.o.   MRN: [id number]   CC: Mitchell Page is a 64 yr old male who presents today with back pain  HPI:  Back pain  Hx of chronic back pain since 2007. Has had surgery on his back before but unsure name if surgery. Since then has been taking 50mg  Tramadol once daily consistently since. Over last week following long drive to and back, pt's back pain h worsened. 8/10 severity, aching pain. No radiation. Exaercbated by prolonged sitting and sleeping. Notices it is worse in the morning. Denies recent trauma/falls. Denies urinary/fecal incontinence/saddle anesthesia/parasthesia/weight loss. Would like to know whether he needs imaging and would like a stronger medication.  Chest pain  1 week hx of left sided chest pain. 6/10 severity, non radiating. Only occurs when he coughs. Cough is dry and denies producing sputum. Not pleuritic in nature. Also saw a small amount of blood on the tissue when blowing his nose. Denies chest pain on exertion. Denies hemoptysis, fevers, URI sx. Goes out regularly but wears a mask. Denies any rcent COVID exposures but cannot be sure he doesn't have COVID.   Med refills Would like refills on all medications for 3 months in advance, is going to [place] at beginning of next month for a few months. Would like his medication to last him that long.  Smoking status reviewed   ROS: pertinent noted in the HPI    Past medical history, surgical, family, and social history reviewed and updated in the EMR as appropriate. Reviewed problem list.   Objective:  BP 130/78   Pulse 88   Wt 159 lb (72.1 kg)   SpO2 96%   BMI 23.48 kg/m   Vitals and nursing note reviewed  General: NAD, pleasant, able to participate in exam Cardiac: RRR, S1 S2 present. normal heart sounds, no murmurs. Respiratory: CTAB, normal effort, No wheezes, rales or rhonchi Extremities: no edema or cyanosis. MSK: Mildly  tender on palpation of lumbar spine and left sided paraspinal muscles. No obvious deformities, no skin changes overlying area. Normal ROM on flexion and lateral flexion of spine. Normal gait. 5/5 strength UE and LE. Normal sensation. DTR 2+. No saddle anesthesia.   Assessment & Plan:    Back pain Back pain likely musculoskeletal in origin, worsened by long drive [place] hours to >68. Worsened on periods of immobilization. Red flags excluded and low suspicion for cauda equina as no urinary/fecal incontinence or saddle anesthesia and normal strength in lower extremeties. Considered Prostate Ca given pt's age however pt appears well, no weight loss or bone pain. No indication for further imaging today. Offered pt physiotherapy but declined. -Recommended staying active and avoiding prolonged periods of immobilization which would worsen back pain -Prescribed Baclofen  -Heat/ice pack therapy -Continue Tramadol at 50mg  once daily   Costochondral chest pain Chest pain likely costochondritis given pt has URTI symptoms and pain is only on coughing. Denies chest pain on exertion or at rest and non radiating so low suspicion for angina/cardiac origin. Recommended pt get tested for COVID and provided contact numbers to arrange test. Precautions given to go to the ER if chest pain worsens and not improving.  Provided 3 month supply for all medications except Tramadol. Recommended that pt see his PCP once his Tramadol runs out.  [place], MD  [facility] PGY-1

## 2019-04-20 ENCOUNTER — Telehealth: Payer: Self-pay | Admitting: Family Medicine

## 2019-04-20 ENCOUNTER — Other Ambulatory Visit: Payer: Self-pay

## 2019-04-20 DIAGNOSIS — G8929 Other chronic pain: Secondary | ICD-10-CM

## 2019-04-20 DIAGNOSIS — M545 Low back pain, unspecified: Secondary | ICD-10-CM

## 2019-04-20 MED ORDER — TRAMADOL HCL 50 MG PO TABS: 50.0000 mg | ORAL_TABLET | Freq: Every day | ORAL | 0 refills | Status: DC | PRN

## 2019-04-20 NOTE — Telephone Encounter (Signed)
Refill request sent to Dr. Parke Simmers. Please advise. Sunday Spillers, CMA

## 2019-04-20 NOTE — Telephone Encounter (Signed)
Pt came into office stating that he needs his tramodol refilled for 3 months because he's going to [place] until May and needs tramadol refilled. Please inform pt when medications are filled.

## 2019-04-21 NOTE — Telephone Encounter (Signed)
Pt calls nurse line stating that he needs 90 days of tramadol before going out of the country. Spoke with pharmacist who stated that due to [place] law, they are unable to dispense the medication until February 11th. Patient informed.   Veronda Prude, RN

## 2019-08-23 ENCOUNTER — Other Ambulatory Visit: Payer: Self-pay | Admitting: Family Medicine

## 2019-08-23 DIAGNOSIS — G8929 Other chronic pain: Secondary | ICD-10-CM

## 2019-08-23 DIAGNOSIS — J301 Allergic rhinitis due to pollen: Secondary | ICD-10-CM

## 2019-11-22 ENCOUNTER — Other Ambulatory Visit: Payer: Self-pay | Admitting: Family Medicine

## 2019-11-22 DIAGNOSIS — IMO0002 Reserved for concepts with insufficient information to code with codable children: Secondary | ICD-10-CM

## 2020-01-31 NOTE — Progress Notes (Signed)
    SUBJECTIVE:   CHIEF COMPLAINT / HPI:    [place] been for the past ten months, and has been out of his medications for a month.  This includes all medications he is taking.  The med list we have for him currently is accurate.  He stopped taking his blood sugar two weeks ago bc he was afraid the numbers would be high.  He needs refills on All his medications.    PERTINENT  PMH / PSH: dm, htn  OBJECTIVE:   BP (!) 162/88   Pulse 80   Ht 5\' 9"  (1.753 m)   Wt 164 lb 9.6 oz (74.7 kg)   SpO2 98%   BMI 24.31 kg/m   Gen: alert, oriented.  No acute distress.   CV: RRR. No murmurs.  Pulm: LCTAB Msk: no edema.   Psych: pleasant affect.   ASSESSMENT/PLAN:   Medication refill Refilled the following medications for 90 days w/ no refills: amlodipine, atorvasttin, invokana, zyrtec, pepcid, gabapentin, losartan, metformin, prilosec, flomax, tramadol ( 30 days). Advised pt to start lantus at ten units daily and increase to 20 U gradually based on fasting glucose levels.  BMP, A1c, lipid panel ordered today.     , MD [facility]

## 2020-02-01 ENCOUNTER — Ambulatory Visit (INDEPENDENT_AMBULATORY_CARE_PROVIDER_SITE_OTHER): Payer: Medicare Other | Admitting: Family Medicine

## 2020-02-01 ENCOUNTER — Encounter: Payer: Self-pay | Admitting: Family Medicine

## 2020-02-01 ENCOUNTER — Other Ambulatory Visit: Payer: Self-pay

## 2020-02-01 VITALS — BP 162/88 | HR 80 | Ht 69.0 in | Wt 164.6 lb

## 2020-02-01 DIAGNOSIS — M545 Low back pain, unspecified: Secondary | ICD-10-CM

## 2020-02-01 DIAGNOSIS — J302 Other seasonal allergic rhinitis: Secondary | ICD-10-CM

## 2020-02-01 DIAGNOSIS — K219 Gastro-esophageal reflux disease without esophagitis: Secondary | ICD-10-CM

## 2020-02-01 DIAGNOSIS — E118 Type 2 diabetes mellitus with unspecified complications: Secondary | ICD-10-CM | POA: Diagnosis not present

## 2020-02-01 DIAGNOSIS — Z794 Long term (current) use of insulin: Secondary | ICD-10-CM

## 2020-02-01 DIAGNOSIS — N401 Enlarged prostate with lower urinary tract symptoms: Secondary | ICD-10-CM

## 2020-02-01 DIAGNOSIS — IMO0002 Reserved for concepts with insufficient information to code with codable children: Secondary | ICD-10-CM

## 2020-02-01 DIAGNOSIS — Z23 Encounter for immunization: Secondary | ICD-10-CM | POA: Diagnosis not present

## 2020-02-01 DIAGNOSIS — I10 Essential (primary) hypertension: Secondary | ICD-10-CM

## 2020-02-01 DIAGNOSIS — R3912 Poor urinary stream: Secondary | ICD-10-CM

## 2020-02-01 DIAGNOSIS — N529 Male erectile dysfunction, unspecified: Secondary | ICD-10-CM

## 2020-02-01 DIAGNOSIS — E1165 Type 2 diabetes mellitus with hyperglycemia: Secondary | ICD-10-CM

## 2020-02-01 DIAGNOSIS — G8929 Other chronic pain: Secondary | ICD-10-CM

## 2020-02-01 DIAGNOSIS — Z760 Encounter for issue of repeat prescription: Secondary | ICD-10-CM

## 2020-02-01 DIAGNOSIS — K279 Peptic ulcer, site unspecified, unspecified as acute or chronic, without hemorrhage or perforation: Secondary | ICD-10-CM

## 2020-02-01 LAB — POCT GLYCOSYLATED HEMOGLOBIN (HGB A1C): Hemoglobin A1C: 9.9 % — AB (ref 4.0–5.6)

## 2020-02-01 MED ORDER — ATORVASTATIN CALCIUM 40 MG PO TABS: 40.0000 mg | ORAL_TABLET | Freq: Every day | ORAL | 0 refills | Status: DC

## 2020-02-01 MED ORDER — FAMOTIDINE 20 MG PO TABS: ORAL_TABLET | ORAL | 0 refills | Status: DC

## 2020-02-01 MED ORDER — AMLODIPINE BESYLATE 5 MG PO TABS: 5.0000 mg | ORAL_TABLET | Freq: Every day | ORAL | 0 refills | Status: DC

## 2020-02-01 MED ORDER — LANTUS SOLOSTAR 100 UNIT/ML ~~LOC~~ SOPN: PEN_INJECTOR | SUBCUTANEOUS | 36 refills | Status: DC

## 2020-02-01 MED ORDER — GABAPENTIN 300 MG PO CAPS: 300.0000 mg | ORAL_CAPSULE | Freq: Two times a day (BID) | ORAL | 0 refills | Status: DC

## 2020-02-01 MED ORDER — TAMSULOSIN HCL 0.4 MG PO CAPS: ORAL_CAPSULE | ORAL | 0 refills | Status: DC

## 2020-02-01 MED ORDER — TRAMADOL HCL 50 MG PO TABS: ORAL_TABLET | ORAL | 0 refills | Status: DC

## 2020-02-01 MED ORDER — LOSARTAN POTASSIUM 50 MG PO TABS
50.0000 mg | ORAL_TABLET | Freq: Every day | ORAL | 0 refills | Status: DC
Start: 2020-02-01 — End: 2020-10-11

## 2020-02-01 MED ORDER — CANAGLIFLOZIN 300 MG PO TABS: 300.0000 mg | ORAL_TABLET | Freq: Every day | ORAL | 0 refills | Status: DC

## 2020-02-01 MED ORDER — CETIRIZINE HCL 10 MG PO TABS: 10.0000 mg | ORAL_TABLET | Freq: Every day | ORAL | 0 refills | Status: DC

## 2020-02-01 MED ORDER — METFORMIN HCL 500 MG PO TABS: ORAL_TABLET | ORAL | 0 refills | Status: DC

## 2020-02-01 MED ORDER — OMEPRAZOLE 40 MG PO CPDR: DELAYED_RELEASE_CAPSULE | ORAL | 0 refills | Status: DC

## 2020-02-01 NOTE — Patient Instructions (Signed)
It was nice to see you today,  I have refilled all of your medications that you requested.  For your diabetes, I would prefer it if you started at 10 units and increased the number of units daily until you reach 20 units.  If your morning fasting blood sugar is over 125, you can increase by 1 unit.  If it is over 200 you can increase by 2 units, until you get to 20 units every day.  I have refilled all your medications for a 90-day supply.  After that your PCP, Dr. Selena Batten, will need to fill them for you.  Please schedule a follow-up appoint with Dr. Selena Batten for 3 months from now so you can get your A1c rechecked.

## 2020-02-02 LAB — LIPID PANEL
Chol/HDL Ratio: 3 ratio (ref 0.0–5.0)
Cholesterol, Total: 155 mg/dL (ref 100–199)
HDL: 51 mg/dL (ref >39)
LDL Chol Calc (NIH): 88 mg/dL (ref 0–99)
Triglycerides: 82 mg/dL (ref 0–149)
VLDL Cholesterol Cal: 16 mg/dL (ref 5–40)

## 2020-02-02 LAB — BASIC METABOLIC PANEL
BUN/Creatinine Ratio: 12 (ref 10–24)
BUN: 12 mg/dL (ref 8–27)
CO2: 23 mmol/L (ref 20–29)
Calcium: 9.7 mg/dL (ref 8.6–10.2)
Chloride: 99 mmol/L (ref 96–106)
Creatinine, Ser: 1.03 mg/dL (ref 0.76–1.27)
GFR calc Af Amer: 88 mL/min/{1.73_m2} (ref >59)
GFR calc non Af Amer: 76 mL/min/{1.73_m2} (ref >59)
Glucose: 387 mg/dL — ABNORMAL HIGH (ref 65–99)
Potassium: 4.3 mmol/L (ref 3.5–5.2)
Sodium: 137 mmol/L (ref 134–144)

## 2020-02-02 NOTE — Assessment & Plan Note (Signed)
Refilled the following medications for 90 days w/ no refills: amlodipine, atorvasttin, invokana, zyrtec, pepcid, gabapentin, losartan, metformin, prilosec, flomax, tramadol ( 30 days). Advised pt to start lantus at ten units daily and increase to 20 U gradually based on fasting glucose levels.  BMP, A1c, lipid panel ordered today.

## 2020-02-06 ENCOUNTER — Other Ambulatory Visit: Payer: Self-pay | Admitting: Family Medicine

## 2020-02-06 DIAGNOSIS — J301 Allergic rhinitis due to pollen: Secondary | ICD-10-CM

## 2020-02-07 ENCOUNTER — Encounter: Payer: Self-pay | Admitting: Family Medicine

## 2020-03-01 IMAGING — CT CT HEAD W/O CM
3 series · 15 of 47 positions shown, 18 images · non-contrast
Comparison: 07/24/2009

CLINICAL DATA: RIGHT-side headache, congestion, productive cough,
diabetes mellitus, hypertension, recently returned from [place]

EXAM:
CT HEAD WITHOUT CONTRAST
TECHNIQUE: Contiguous axial images were obtained from the base of the skull
through the vertex without intravenous contrast. Sagittal and
coronal MPR images reconstructed from axial data set.

[Series 3: head 5.0 h30s · axial · 0.42mm/px · z∈[-95,+40]mm · 9 of 33 slices shown, 12 images]
[im 3/33  brain]
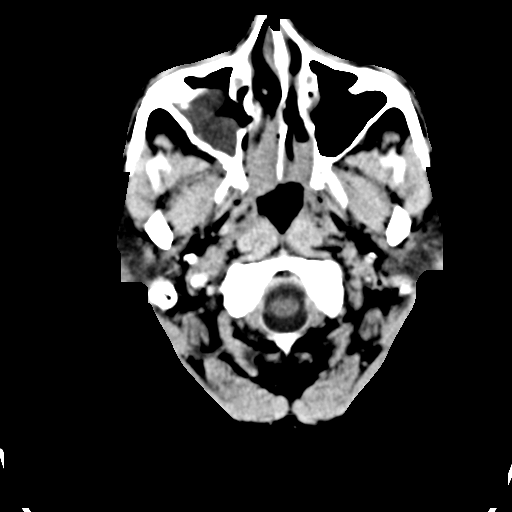
[im 3/33  bone]
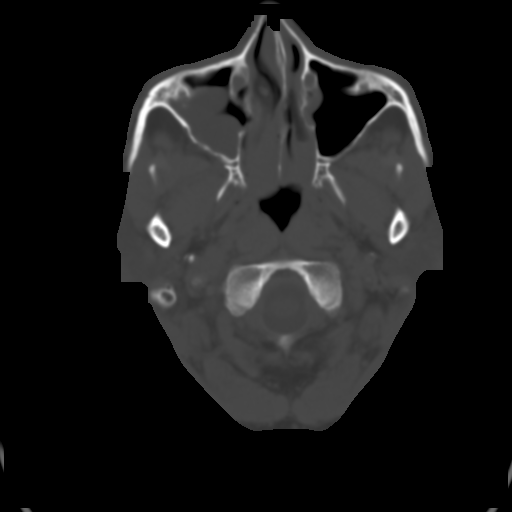
[im 6/33  brain]
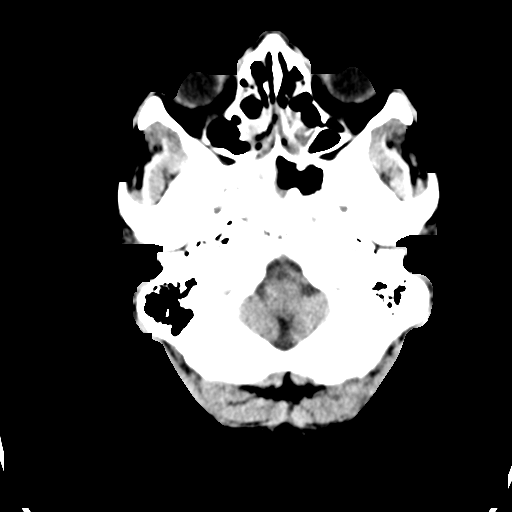
[im 9/33  brain]
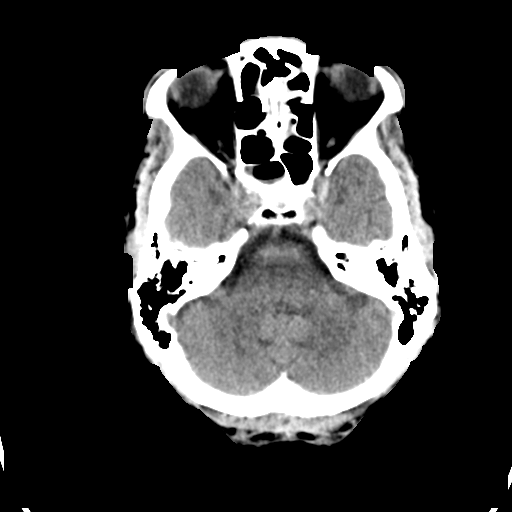
[im 13/33  brain]
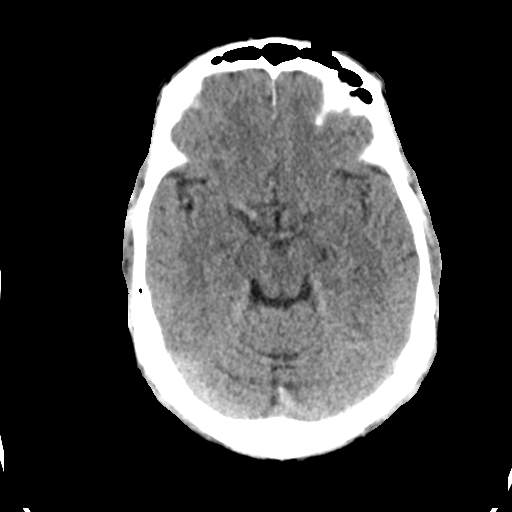
[im 17/33  brain]
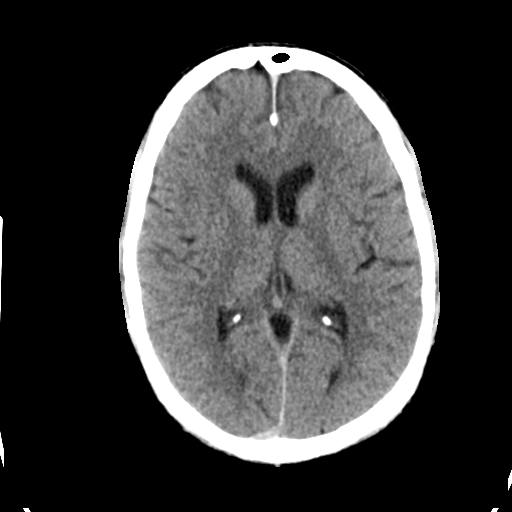
[im 17/33  bone]
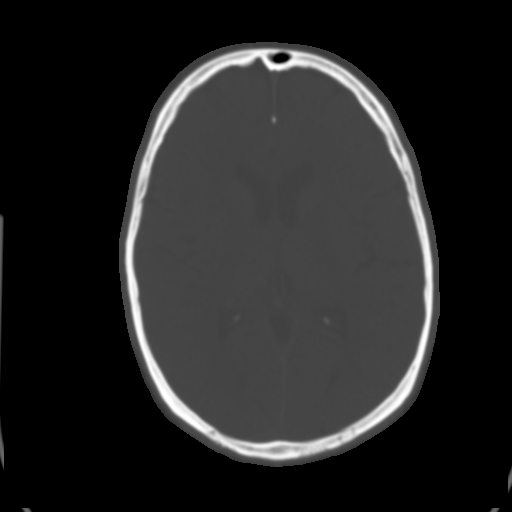
[im 20/33  brain]
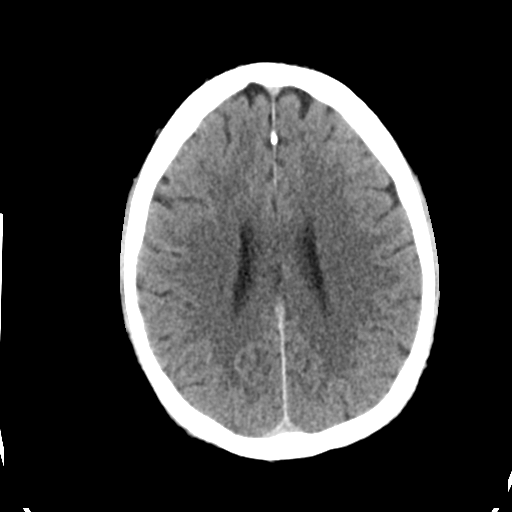
[im 24/33  brain]
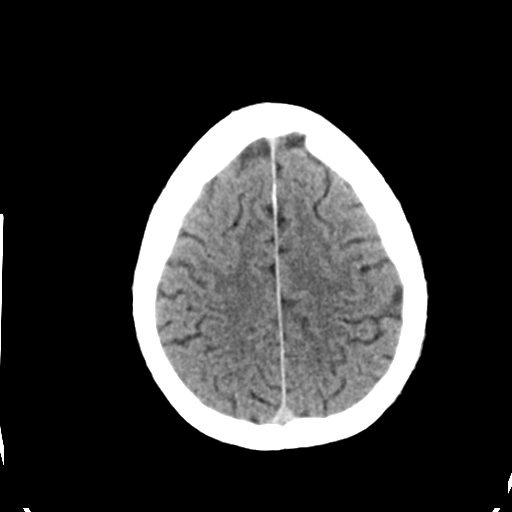
[im 27/33  brain]
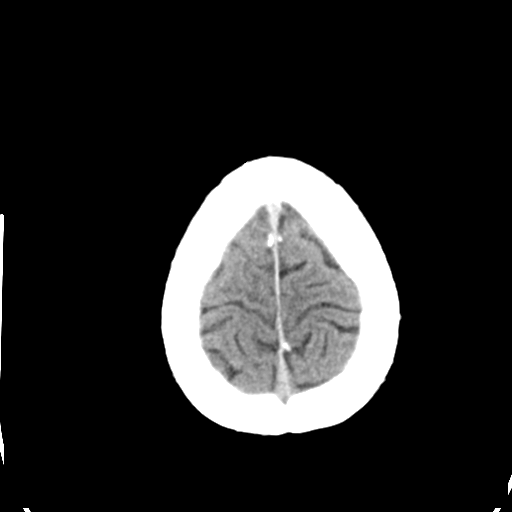
[im 30/33  brain]
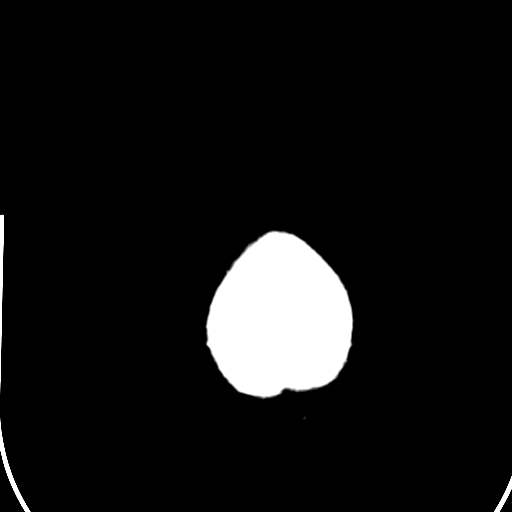
[im 30/33  bone]
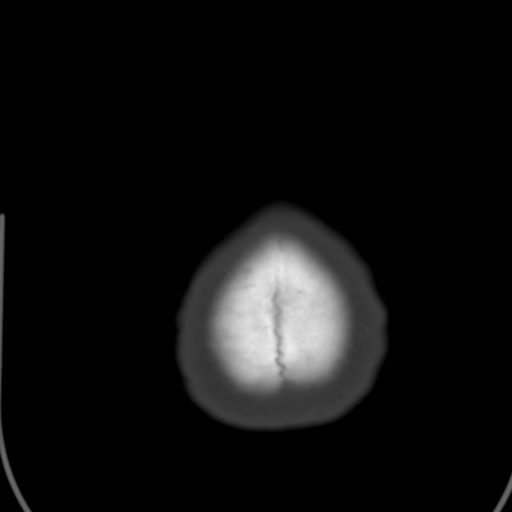

[Series 5: head 3.0 mpr cor · coronal · 0.32mm/px · 3 of 67 slices shown]
[im 23/67  brain]
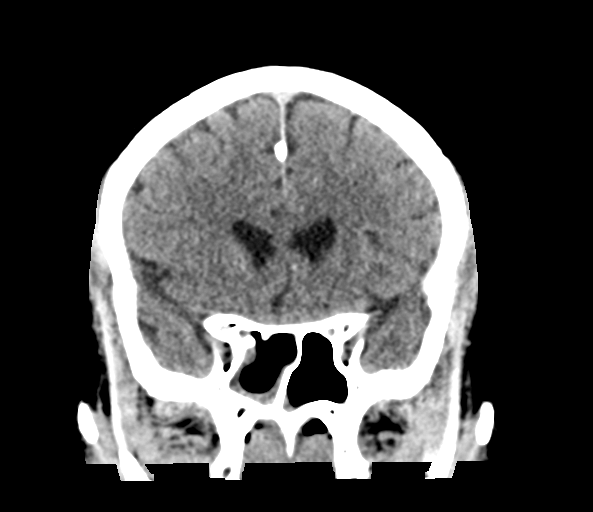
[im 30/67  brain]
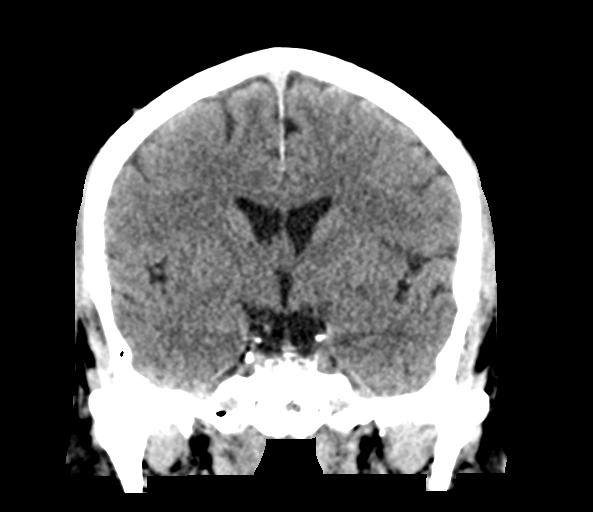
[im 37/67  brain]
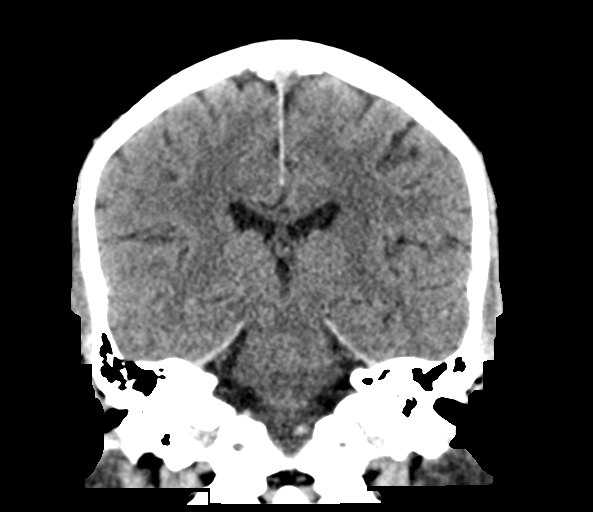

[Series 6: head 3.0 mpr sag · sagittal · 0.33mm/px · 3 of 67 slices shown]
[im 23/67  brain]
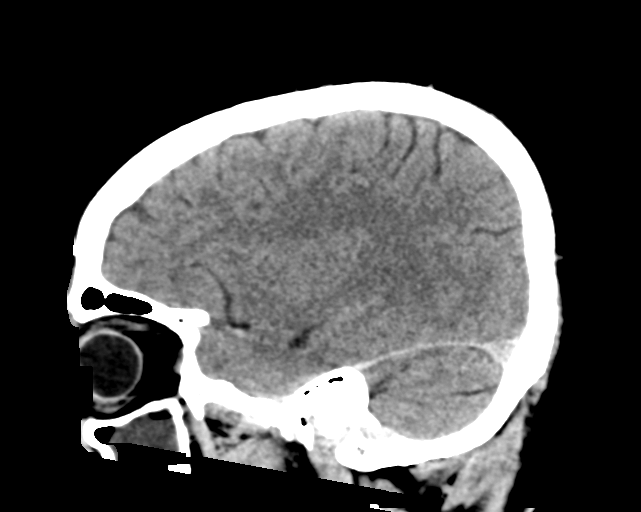
[im 34/67  brain]
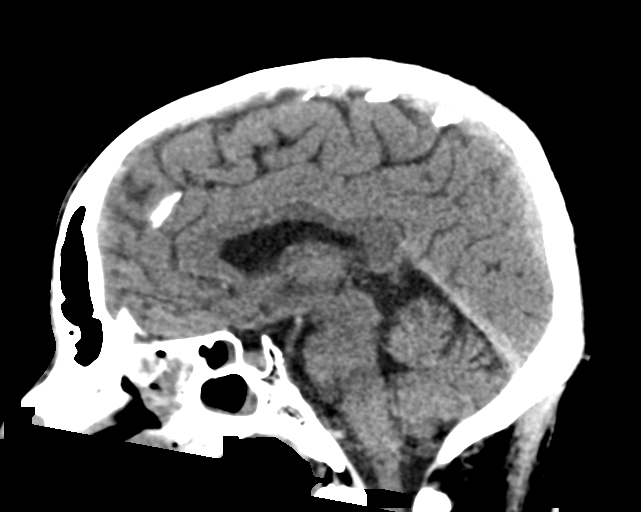
[im 45/67  brain]
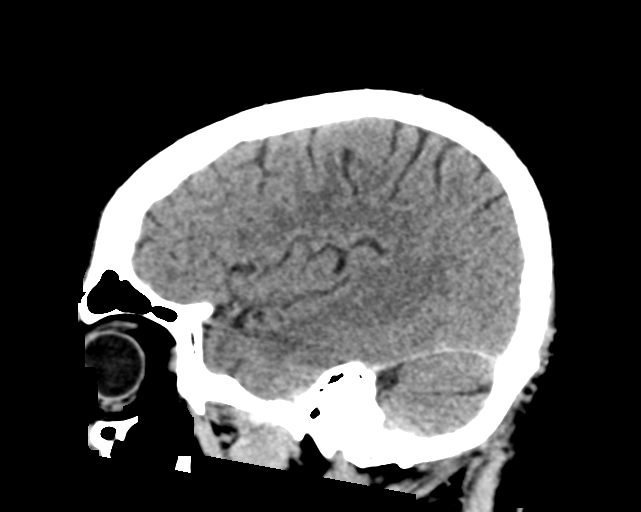

[15 of 47 positions shown; findings below may reference images not displayed]

FINDINGS: Brain: Normal ventricular morphology. No midline shift or mass
effect. Normal appearance of brain parenchyma. No intracranial
hemorrhage, mass lesion, or evidence of acute infarction. No
extra-axial fluid collections. Scattered small calcifications in
falx.

Vascular: Minimal atherosclerotic calcification in the internal
carotid arteries. No hyperdense vessels.

Skull: Intact

Sinuses/Orbits: Mucosal thickening throughout the nasal passages and
ethmoid air cells, minimally in LEFT maxillary sinus. Mucosal
thickening with small amount of dependent fluid within the sphenoid
sinus. No cell thickening and question mucosal retention cysts RIGHT
maxillary sinus.

Other: N/A
IMPRESSION: No acute intracranial abnormalities.

Scattered sinus disease changes.

## 2020-04-05 ENCOUNTER — Other Ambulatory Visit: Payer: Self-pay | Admitting: Family Medicine

## 2020-04-05 DIAGNOSIS — E1165 Type 2 diabetes mellitus with hyperglycemia: Secondary | ICD-10-CM

## 2020-04-05 DIAGNOSIS — IMO0002 Reserved for concepts with insufficient information to code with codable children: Secondary | ICD-10-CM

## 2020-04-12 ENCOUNTER — Encounter: Payer: Self-pay | Admitting: Family Medicine

## 2020-04-12 ENCOUNTER — Ambulatory Visit (INDEPENDENT_AMBULATORY_CARE_PROVIDER_SITE_OTHER): Payer: Medicare Other | Admitting: Family Medicine

## 2020-04-12 ENCOUNTER — Other Ambulatory Visit: Payer: Self-pay

## 2020-04-12 DIAGNOSIS — I10 Essential (primary) hypertension: Secondary | ICD-10-CM | POA: Diagnosis not present

## 2020-04-12 DIAGNOSIS — J301 Allergic rhinitis due to pollen: Secondary | ICD-10-CM

## 2020-04-12 DIAGNOSIS — E118 Type 2 diabetes mellitus with unspecified complications: Secondary | ICD-10-CM

## 2020-04-12 DIAGNOSIS — E1165 Type 2 diabetes mellitus with hyperglycemia: Secondary | ICD-10-CM

## 2020-04-12 DIAGNOSIS — IMO0002 Reserved for concepts with insufficient information to code with codable children: Secondary | ICD-10-CM

## 2020-04-12 DIAGNOSIS — Z794 Long term (current) use of insulin: Secondary | ICD-10-CM

## 2020-04-12 MED ORDER — FLUTICASONE PROPIONATE 50 MCG/ACT NA SUSP: NASAL | 2 refills | Status: DC

## 2020-04-12 MED ORDER — CANAGLIFLOZIN 300 MG PO TABS: 300.0000 mg | ORAL_TABLET | Freq: Every day | ORAL | 0 refills | Status: DC

## 2020-04-12 MED ORDER — AMLODIPINE BESYLATE 10 MG PO TABS: 10.0000 mg | ORAL_TABLET | Freq: Every day | ORAL | 3 refills | Status: DC

## 2020-04-12 MED ORDER — BUTALBITAL-APAP-CAFFEINE 50-325-40 MG PO TABS
1.0000 | ORAL_TABLET | Freq: Four times a day (QID) | ORAL | 0 refills | Status: DC | PRN
Start: 2020-04-12 — End: 2020-04-20

## 2020-04-12 MED ORDER — BLOOD PRESSURE CUFF MISC: 1.0000 [IU] | Freq: Every day | 0 refills | Status: DC

## 2020-04-12 NOTE — Progress Notes (Signed)
    SUBJECTIVE:   CHIEF COMPLAINT / HPI:   Blood glucose concerns Patient reports that his blood glucoses have been elevated since he was last seen here.  He reports he was told at his previous visit to take 10 units of Lantus daily and that his blood sugars have been in the 300-400 with the highest being 453.  Denies any lethargy, weakness.  Reports that he has had increased urination and headaches.  On chart review it appears that he was supposed to start at 12 units daily and titrate up with a max of 20 units daily.  Patient did not know he was supposed to be doing this.  He is taking his Metformin 1000 mg twice daily.  Blood pressure concerns Patient reports that he has been checking his blood pressures at home with his at home blood pressure wrist cuff.  He does not think that it is accurate because he has been seeing some extremely elevated numbers such as in the mid 200s systolic.  He checked his here and it was showing close to 200/140s and our machine showed 150/80.  A recheck showed 130/72.  Patient reports that he may be having headaches due to the blood pressures but that it also may be related to the fact that he used to have headaches pretty regularly.  He took Fioricet in the past which she reports really helped and one prescription would last him for approximately 6 months.  OBJECTIVE:   BP 130/72   Pulse 92   Ht 5\' 9"  (1.753 m)   Wt 160 lb 3.2 oz (72.7 kg)   SpO2 95%   BMI 23.66 kg/m   General: Well-appearing 65 year old male in no acute distress HEENT: Pupils equal and reactive to light, extraocular movement intact Cardiac: Regular rate and rhythm, no murmurs appreciated Respiratory: Normal breathing, lungs clear to auscultation bilaterally Extremities: No lower extremity edema noted  ASSESSMENT/PLAN:   Essential hypertension .Blood pressures are at goal during today's visit.  We will continue on his current medication regimen consider increasing amlodipine to 10 mg  daily if blood pressures remain consistently high.  Sent a prescription for the patient to get a new blood pressure cuff.  Follow-up in 3-4 weeks.  Type II diabetes mellitus with manifestations, uncontrolled ([facility]) Patient's blood sugars have been extremely elevated recently.  He did not know that he was supposed to titrate his insulin up 2 units/day until his fasting blood sugars were below 200.  As fasting blood sugars have remained over 300 and mainly in the 400 range.  Clearly discussed medication instructions with the patient and he was going to titrate his Lantus up.  Continue Metformin 1000 mg twice daily.  Keep a daily blood sugar log and follow-up in 3 weeks.  Strict return precautions given and strict ED precautions given.     77, MD [facility]

## 2020-04-12 NOTE — Patient Instructions (Addendum)
It was wonderful seeing you today.  Regarding your blood sugars I want you to go ahead and increase your insulin to at least 16 and titrate up depending on if your blood sugars are still elevated the following day in the morning.  I want you to check your blood sugars 2 days in a row and if the fasting morning blood sugar is still above 200 I want you to increase it another 2 units.  Continue doing this until your morning fasting blood sugars are less than 200.  I also sent a new prescription in for your Invokana.  Regarding your blood pressure they are not near as high as you thought they were but they are still mildly elevated.  I want to increase your amlodipine (Norvasc) to 10 mg daily from 5 mg daily.  I have sent in a prescription for a new blood pressure cuff for you.  I would like for you to follow-up in 2-4 weeks to ensure that your blood pressures are well controlled as well as your blood sugars.  If you have any episodes of hypoglycemia with low blood sugar please call our clinic immediately and drink some juice.  If you have any episodes of hypotension please let us know and we can make adjustments to your blood pressure medications.  I hope you have a wonderful afternoon!   Hypoglycemia Hypoglycemia is when the sugar (glucose) level in your blood is too low. Low blood sugar can happen to people who have diabetes and people who do not have diabetes. Low blood sugar can happen quickly, and it can be an emergency. What are the causes? This condition happens most often in people who have diabetes and may be caused by:  Diabetes medicine.  Not eating enough, or not eating often enough.  Doing more physical activity.  Drinking alcohol on an empty stomach. If you do not have diabetes, hypoglycemia may be caused by:  A tumor in the pancreas.  Not eating enough, or not eating for long periods at a time (fasting).  A very bad infection or illness.  Problems after having weight loss  (bariatric) surgery.  Kidney failure or liver failure.  Certain medicines. What increases the risk? This condition is more likely to develop in people who:  Have diabetes and take medicines to lower their blood sugar.  Abuse alcohol.  Have a very bad illness. What are the signs or symptoms? Symptoms depend on whether your low blood sugar is mild, moderate, or very low. Mild  Hunger.  Feeling worried or nervous (anxious).  Sweating and feeling clammy.  Feeling dizzy or light-headed.  Being sleepy or having trouble sleeping.  Feeling like you may vomit (nauseous).  A fast heartbeat.  A headache.  Blurry vision.  Being irritable or grouchy.  Tingling or loss of feeling (numbness) around your mouth, lips, or tongue.  Trouble with moving (coordination). Moderate  Confusion and poor judgment.  Behavior changes.  Weakness.  Uneven heartbeats. Very low Very low blood sugar (severe hypoglycemia) is a medical emergency. It can cause:  Fainting.  Jerky movements that you cannot control (seizure).  Loss of consciousness (coma).  Death. How is this treated? Treating low blood sugar Low blood sugar is often treated by eating or drinking something sugary right away. The snack should contain 15 grams of a fast-acting carb (carbohydrate). Options include:  4 oz (120 mL) of fruit juice.  4-6 oz (120-150 mL) of regular soda (not diet soda).  8 oz (240  mL) of low-fat milk.  Several pieces of hard candy. Check food labels to find out how many to eat for 15 grams.  1 Tbsp (15 mL) of sugar or honey. Treating low blood sugar if you have diabetes If you can think clearly and swallow safely, follow the 15:15 rule:  Take 15 grams of a fast-acting carb. Talk with your doctor about how much you should take.  Always keep a source of fast-acting carb with you, such as: ? Sugar tablets (glucose pills). Take 4 pills. ? Several pieces of hard candy. Check food labels to  see how many pieces to eat for 15 grams. ? 4 oz (120 mL) of fruit juice. ? 4-6 oz (120-150 mL) of regular (not diet) soda. ? 1 Tbsp (15 mL) of honey or sugar.  Check your blood sugar 15 minutes after you take the carb.  If your blood sugar is still at or below 70 mg/dL (3.9 mmol/L), take 15 grams of a carb again.  If your blood sugar does not go above 70 mg/dL (3.9 mmol/L) after 3 tries, get help right away.  After your blood sugar goes back to normal, eat a meal or a snack within 1 hour.   Treating very low blood sugar If your blood sugar is at or below 54 mg/dL (3 mmol/L), you have very low blood sugar, or severe hypoglycemia. This is an emergency. Get medical help right away. If you have very low blood sugar and you cannot eat or drink, you will need to be given a hormone called glucagon. A family member or friend should learn how to check your blood sugar and how to give you glucagon. Ask your doctor if you need to have an emergency glucagon kit at home. Very low blood sugar may also need to be treated in a hospital. Follow these instructions at home: General instructions  Take over-the-counter and prescription medicines only as told by your doctor.  Stay aware of your blood sugar as told by your doctor.  If you drink alcohol: ? Limit how much you use to:  0-1 drink a day for nonpregnant women.  0-2 drinks a day for men. ? Be aware of how much alcohol is in your drink. In the [place] one drink equals one 12 oz bottle of beer (355 mL), one 5 oz glass of wine (148 mL), or one 1 oz glass of hard liquor (44 mL).  Keep all follow-up visits as told by your doctor. This is important. If you have diabetes:  Always have a rapid-acting carb (15 grams) option with you to treat low blood sugar.  Follow your diabetes care plan as told by your doctor. Make sure you: ? Know the symptoms of low blood sugar. ? Check your blood sugar as often as told by your doctor. Always check it before and  after exercise. ? Always check your blood sugar before you drive. ? Take your medicines as told. ? Follow your meal plan. ? Eat on time. Do not skip meals.  Share your diabetes care plan with: ? Your work or school. ? People you live with.  Carry a card or wear jewelry that says you have diabetes.   Contact a doctor if:  You have trouble keeping your blood sugar in your target range.  You have low blood sugar often. Get help right away if:  You still have symptoms after you eat or drink something that contains 15 grams of fast-acting carb and you cannot get your  blood sugar above 70 mg/dL by following the 15:15 rule.  Your blood sugar is at or below 54 mg/dL (3 mmol/L).  You have a seizure.  You faint. These symptoms may be an emergency. Do not wait to see if the symptoms will go away. Get medical help right away. Call your local emergency services (911 in the [place] Do not drive yourself to the hospital. Summary  Hypoglycemia happens when the level of sugar (glucose) in your blood is too low.  Low blood sugar can happen to people who have diabetes and people who do not have diabetes. Low blood sugar can happen quickly, and it can be an emergency.  Make sure you know the symptoms of low blood sugar and know how to treat it.  Always keep a source of sugar (fast-acting carb) with you to treat low blood sugar. This information is not intended to replace advice given to you by your health care provider. Make sure you discuss any questions you have with your health care provider. Document Revised: 01/26/2019 Document Reviewed: 01/26/2019 Elsevier Patient Education  2021 [employer].

## 2020-04-13 NOTE — Assessment & Plan Note (Signed)
.  Blood pressures are at goal during today's visit.  We will continue on his current medication regimen consider increasing amlodipine to 10 mg daily if blood pressures remain consistently high.  Sent a prescription for the patient to get a new blood pressure cuff.  Follow-up in 3-4 weeks.

## 2020-04-13 NOTE — Assessment & Plan Note (Signed)
Patient's blood sugars have been extremely elevated recently.  He did not know that he was supposed to titrate his insulin up 2 units/day until his fasting blood sugars were below 200.  As fasting blood sugars have remained over 300 and mainly in the 400 range.  Clearly discussed medication instructions with the patient and he was going to titrate his Lantus up.  Continue Metformin 1000 mg twice daily.  Keep a daily blood sugar log and follow-up in 3 weeks.  Strict return precautions given and strict ED precautions given.

## 2020-04-17 ENCOUNTER — Telehealth: Payer: Self-pay | Admitting: *Deleted

## 2020-04-17 NOTE — Telephone Encounter (Signed)
Received fax from pharmacy with note about pts Rx for butalbital/acetaminophen/caff tabs.  Note says-Max day supply of 2. Capers Hagmann Zimmerman Rumple, CMA

## 2020-04-20 MED ORDER — BUTALBITAL-APAP-CAFFEINE 50-325-40 MG PO TABS
1.0000 | ORAL_TABLET | Freq: Four times a day (QID) | ORAL | 0 refills | Status: AC | PRN
Start: 2020-04-20 — End: 2020-04-22

## 2020-04-20 NOTE — Telephone Encounter (Signed)
New prescription sent for 2 days worth of medication rather than 5

## 2020-04-27 ENCOUNTER — Other Ambulatory Visit: Payer: Self-pay

## 2020-04-27 ENCOUNTER — Ambulatory Visit (INDEPENDENT_AMBULATORY_CARE_PROVIDER_SITE_OTHER): Payer: Medicare Other

## 2020-04-27 ENCOUNTER — Ambulatory Visit (INDEPENDENT_AMBULATORY_CARE_PROVIDER_SITE_OTHER): Payer: Medicare Other | Admitting: Family Medicine

## 2020-04-27 ENCOUNTER — Encounter: Payer: Self-pay | Admitting: Family Medicine

## 2020-04-27 VITALS — BP 138/60 | HR 90 | Ht 69.0 in | Wt 160.2 lb

## 2020-04-27 DIAGNOSIS — Z23 Encounter for immunization: Secondary | ICD-10-CM

## 2020-04-27 DIAGNOSIS — IMO0002 Reserved for concepts with insufficient information to code with codable children: Secondary | ICD-10-CM

## 2020-04-27 DIAGNOSIS — I10 Essential (primary) hypertension: Secondary | ICD-10-CM | POA: Diagnosis not present

## 2020-04-27 DIAGNOSIS — E118 Type 2 diabetes mellitus with unspecified complications: Secondary | ICD-10-CM | POA: Diagnosis not present

## 2020-04-27 DIAGNOSIS — K279 Peptic ulcer, site unspecified, unspecified as acute or chronic, without hemorrhage or perforation: Secondary | ICD-10-CM

## 2020-04-27 DIAGNOSIS — E1165 Type 2 diabetes mellitus with hyperglycemia: Secondary | ICD-10-CM

## 2020-04-27 DIAGNOSIS — Z794 Long term (current) use of insulin: Secondary | ICD-10-CM

## 2020-04-27 MED ORDER — METFORMIN HCL 500 MG PO TABS: ORAL_TABLET | ORAL | 0 refills | Status: DC

## 2020-04-27 MED ORDER — AMLODIPINE BESYLATE 10 MG PO TABS: 10.0000 mg | ORAL_TABLET | Freq: Every day | ORAL | 3 refills | Status: DC

## 2020-04-27 MED ORDER — ATORVASTATIN CALCIUM 40 MG PO TABS: 40.0000 mg | ORAL_TABLET | Freq: Every day | ORAL | 0 refills | Status: DC

## 2020-04-27 MED ORDER — FAMOTIDINE 20 MG PO TABS: ORAL_TABLET | ORAL | 0 refills | Status: DC

## 2020-04-27 MED ORDER — LANTUS SOLOSTAR 100 UNIT/ML ~~LOC~~ SOPN: PEN_INJECTOR | SUBCUTANEOUS | 36 refills | Status: DC

## 2020-04-27 NOTE — Assessment & Plan Note (Signed)
Blood pressure elevated today at 138/68.  Denies any low blood pressures.  Has been taking amlodipine 5 mg daily and losartan 50 mg daily.  Reports that headaches have resolved but his blood pressure is still elevated.  He was unable to get a new blood pressure cuff so he does not know what his blood pressures have been at home. -Discussed ensuring he gets blood pressure cuff from the pharmacy -Increasing amlodipine to 10 mg daily -We will follow-up on blood pressure at next visit -Strict return precautions given

## 2020-04-27 NOTE — Progress Notes (Signed)
    SUBJECTIVE:   CHIEF COMPLAINT / HPI:   Diabetes follow-up Patient reports that he increase his Lantus up to 20 units daily and is taking Metformin 1000 mg twice daily. He brings his glucometer in which shows a 7-day average of 309, a 14-day average of 269, and a 1 month average of 308. Denies any low blood sugars. Denies any signs or symptoms of hypoglycemia. When asked to describe his morning routine he reports that he wakes up, he eats breakfast, and then he checks his blood sugar and takes his medications immediately after finishing breakfast.  Hypertension follow-up Patient reports that he does not feel like his blood pressures have been elevated since seeing me last but he is unsure. His headaches have resolved. He could not get a blood pressure cuff because he reports that the prescription did not go through. He sees it on his prescription list so is planning on going to the pharmacy to ask. He has been taking his amlodipine 5 mg daily and losartan daily   OBJECTIVE:   BP 138/60   Pulse 90   Ht 5\' 9"  (1.753 m)   Wt 160 lb 3.2 oz (72.7 kg)   SpO2 97%   BMI 23.66 kg/m   General: Well-appearing 65 year old male, no acute distress Cardiac: Regular rate and rhythm, no murmurs present Respiratory: Normal work of breathing, lungs clear to auscultation bilaterally Abdomen: Soft, nontender, positive bowel sounds Extremities: No gross abnormalities   ASSESSMENT/PLAN:   Type II diabetes mellitus with manifestations, uncontrolled (HCC) Patient brings blood sugar meter in.  He reports that his blood sugars have still been elevated with his average over the last few weeks being in the 200s.  Blood sugar meter shows 7-day average of 309, 14-day average of 269, 28-day average of 308.  After further discussion with the patient it appears he has been taking his blood sugars soon after eating breakfast and not fasting or 2 hours postprandial.  Discussed the proper way to check his blood  sugars.  He is going to continue on 20 units of Lantus daily as well as Metformin 1000 mg twice daily and we are going to follow-up on 2/28 to determine if there is need for further changes in his diabetes regimen.  Essential hypertension Blood pressure elevated today at 138/68.  Denies any low blood pressures.  Has been taking amlodipine 5 mg daily and losartan 50 mg daily.  Reports that headaches have resolved but his blood pressure is still elevated.  He was unable to get a new blood pressure cuff so he does not know what his blood pressures have been at home. -Discussed ensuring he gets blood pressure cuff from the pharmacy -Increasing amlodipine to 10 mg daily -We will follow-up on blood pressure at next visit -Strict return precautions given     3/28, MD [facility]

## 2020-04-27 NOTE — Assessment & Plan Note (Signed)
Patient brings blood sugar meter in.  He reports that his blood sugars have still been elevated with his average over the last few weeks being in the 200s.  Blood sugar meter shows 7-day average of 309, 14-day average of 269, 28-day average of 308.  After further discussion with the patient it appears he has been taking his blood sugars soon after eating breakfast and not fasting or 2 hours postprandial.  Discussed the proper way to check his blood sugars.  He is going to continue on 20 units of Lantus daily as well as Metformin 1000 mg twice daily and we are going to follow-up on 2/28 to determine if there is need for further changes in his diabetes regimen.

## 2020-04-27 NOTE — Patient Instructions (Signed)
It was a pleasure seeing you today.  I am not sure what to make of your blood sugars because you have been taking them after you ate breakfast but not waiting at least 2 hours.  I want you to wake up every morning and take your blood sugar before you eat or drink anything.  Please keep a log of your blood sugars and I want to follow-up in 2 weeks regarding what your sugars are to determine what medication changes we need to make.  Please continue taking your Metformin and Lantus 20 units daily.  Regarding her blood pressure I have no concerns at this time and we will are going to continue on your current regimen.  Regarding your Covid vaccine you received the first dose today and will need your second dose in 21 days.  This can be a nurse visit.  Please let me know if you have any questions or concerns and I will see you in 2 weeks.   Blood Glucose Monitoring, Adult Monitoring your blood sugar (glucose) is an important part of managing your diabetes. Blood glucose monitoring involves checking your blood glucose as often as directed and keeping a log or record of your results over time. Checking your blood glucose regularly and keeping a blood glucose log can:  Help you and your health care provider adjust your diabetes management plan as needed, including your medicines or insulin.  Help you understand how food, exercise, illnesses, and medicines affect your blood glucose.  Let you know what your blood glucose is at any time. You can quickly find out if you have low blood glucose (hypoglycemia) or high blood glucose (hyperglycemia). Your health care provider will set individualized treatment goals for you. Your goals will be based on your age, other medical conditions you have, and how you respond to diabetes treatment. Generally, the goal of treatment is to maintain the following blood glucose levels:  Before meals (preprandial): 80-130 mg/dL (1.8-5.6 mmol/L).  After meals (postprandial): below 180  mg/dL (10 mmol/L).  A1C level: less than 7%. Supplies needed:  Blood glucose meter.  Test strips for your meter. Each meter has its own strips. You must use the strips that came with your meter.  A needle to prick your finger (lancet). Do not use a lancet more than one time.  A device that holds the lancet (lancing device).  A journal or log book to write down your results. How to check your blood glucose Checking your blood glucose 1. Wash your hands for at least 20 seconds with soap and water. 2. Prick the side of your finger (not the tip) with the lancet. Do not use the same finger consecutively. 3. Gently rub the finger until a small drop of blood appears. 4. Follow instructions that come with your meter for inserting the test strip, applying blood to the strip, and using your blood glucose meter. 5. Write down your result and any notes in your log.   Using alternative sites Some meters allow you to use areas of your body other than your finger (alternative sites) to test your blood. The most common alternative sites are the forearm, the thigh, and the palm of your hand. Alternative sites may not be as accurate as the fingers because blood flow is slower in those areas. This means that the result you get may be delayed, and it may be different from the result that you would get from your finger. Use the finger only, and do not use  alternative sites, if:  You think you have hypoglycemia.  You sometimes do not know that your blood glucose is getting low (hypoglycemia unawareness). General tips and recommendations Blood glucose log  Every time you check your blood glucose, write down your result. Also write down any notes about things that may be affecting your blood glucose, such as your diet and exercise for the day. This information can help you and your health care provider: ? Look for patterns in your blood glucose over time. ? Adjust your diabetes management plan as  needed.  Check if your meter allows you to download your records to a computer or if there is an app for the meter. Most glucose meters store a record of glucose readings in the meter.   If you have type 1 diabetes:  Check your blood glucose 4 or more times a day if you are on intensive insulin therapy with multiple daily injections (MDI) or if you are using an insulin pump. Check your blood glucose: ? Before every meal and snack. ? Before bedtime.  Also check your blood glucose: ? If you have symptoms of hypoglycemia. ? After treating low blood glucose. ? Before doing activities that create a risk for injury, like driving or using machinery. ? Before and after exercise. ? Two hours after a meal. ? Occasionally between 2:00 a.m. and 3:00 a.m., as directed.  You may need to check your blood glucose more often, 6-10 times per day, if: ? You have diabetes that is not well controlled. ? You are ill. ? You have a history of severe hypoglycemia. ? You have hypoglycemia unawareness. If you have type 2 diabetes:  Check your blood glucose 2 or more times a day if you take insulin or other diabetes medicines.  Check your blood glucose 4 or more times a day if you are on intensive insulin therapy. Occasionally, you may also need to check your glucose between 2:00 a.m. and 3:00 a.m., as directed.  Also check your blood glucose: ? Before and after exercise. ? Before doing activities that create a risk for injury, like driving or using machinery.  You may need to check your blood glucose more often if: ? Your medicine is being adjusted. ? Your diabetes is not well controlled. ? You are ill. General tips  Make sure you always have your supplies with you.  After you use a few boxes of test strips, adjust (calibrate) your blood glucose meter by following instructions that came with your meter.  If you have questions or need help, all blood glucose meters have a 24-hour hotline phone number  available that you can call. Also contact your health care provider with questions or concerns you may have. Where to find more information  The American Diabetes Association: www.diabetes.org  The Association of Diabetes Care & Education Specialists: www.diabeteseducator.org Contact a health care provider if:  Your blood glucose is at or above 240 mg/dL (15.1 mmol/L) for 2 days in a row.  You have been sick or have had a fever for 2 days or longer, and you are not getting better.  You have any of the following problems for more than 6 hours: ? You cannot eat or drink. ? You have nausea or vomiting. ? You have diarrhea. Get help right away if:  Your blood glucose is lower than 54 mg/dL (3 mmol/L).  You become confused, or you have trouble thinking clearly.  You have difficulty breathing.  You have moderate or large ketone  levels in your urine. These symptoms may represent a serious problem that is an emergency. Do not wait to see if the symptoms will go away. Get medical help right away. Call your local emergency services (911 in the [place] Do not drive yourself to the hospital. Summary  Monitoring your blood glucose is an important part of managing your diabetes.  Blood glucose monitoring involves checking your blood glucose as often as directed and keeping a log or record of your results over time.  Your health care provider will set individualized treatment goals for you. Your goals will be based on your age, other medical conditions you have, and how you respond to diabetes treatment.  Every time you check your blood glucose, write down your result. Also, write down any notes about things that may be affecting your blood glucose, such as your diet and exercise for the day. This information is not intended to replace advice given to you by your health care provider. Make sure you discuss any questions you have with your health care provider. Document Revised: 11/30/2019  Document Reviewed: 11/30/2019 Elsevier Patient Education  2021 [employer].

## 2020-05-07 ENCOUNTER — Other Ambulatory Visit: Payer: Self-pay | Admitting: Family Medicine

## 2020-05-07 DIAGNOSIS — I10 Essential (primary) hypertension: Secondary | ICD-10-CM

## 2020-05-11 ENCOUNTER — Other Ambulatory Visit: Payer: Self-pay | Admitting: Family Medicine

## 2020-05-11 DIAGNOSIS — G8929 Other chronic pain: Secondary | ICD-10-CM

## 2020-05-11 DIAGNOSIS — M545 Low back pain, unspecified: Secondary | ICD-10-CM

## 2020-05-14 ENCOUNTER — Other Ambulatory Visit: Payer: Self-pay

## 2020-05-14 ENCOUNTER — Encounter: Payer: Self-pay | Admitting: Family Medicine

## 2020-05-14 ENCOUNTER — Ambulatory Visit (INDEPENDENT_AMBULATORY_CARE_PROVIDER_SITE_OTHER): Payer: Medicare Other | Admitting: Family Medicine

## 2020-05-14 VITALS — BP 118/74 | HR 84 | Wt 158.6 lb

## 2020-05-14 DIAGNOSIS — M545 Low back pain, unspecified: Secondary | ICD-10-CM

## 2020-05-14 DIAGNOSIS — K219 Gastro-esophageal reflux disease without esophagitis: Secondary | ICD-10-CM

## 2020-05-14 DIAGNOSIS — E118 Type 2 diabetes mellitus with unspecified complications: Secondary | ICD-10-CM

## 2020-05-14 DIAGNOSIS — IMO0002 Reserved for concepts with insufficient information to code with codable children: Secondary | ICD-10-CM

## 2020-05-14 DIAGNOSIS — G8929 Other chronic pain: Secondary | ICD-10-CM

## 2020-05-14 DIAGNOSIS — E1165 Type 2 diabetes mellitus with hyperglycemia: Secondary | ICD-10-CM | POA: Diagnosis not present

## 2020-05-14 DIAGNOSIS — K279 Peptic ulcer, site unspecified, unspecified as acute or chronic, without hemorrhage or perforation: Secondary | ICD-10-CM

## 2020-05-14 DIAGNOSIS — Z794 Long term (current) use of insulin: Secondary | ICD-10-CM

## 2020-05-14 LAB — POCT GLYCOSYLATED HEMOGLOBIN (HGB A1C): Hemoglobin A1C: 12 % — AB (ref 4.0–5.6)

## 2020-05-14 MED ORDER — OMEPRAZOLE 40 MG PO CPDR: DELAYED_RELEASE_CAPSULE | ORAL | 0 refills | Status: DC

## 2020-05-14 MED ORDER — LANTUS SOLOSTAR 100 UNIT/ML ~~LOC~~ SOPN: PEN_INJECTOR | SUBCUTANEOUS | 36 refills | Status: DC

## 2020-05-14 MED ORDER — ACCU-CHEK SOFTCLIX LANCETS MISC: 12 refills | Status: DC

## 2020-05-14 MED ORDER — TRAMADOL HCL 50 MG PO TABS: ORAL_TABLET | ORAL | 0 refills | Status: DC

## 2020-05-14 MED ORDER — ACCU-CHEK GUIDE W/DEVICE KIT
1.0000 | PACK | Freq: Every day | 0 refills | Status: DC
Start: 2020-05-14 — End: 2022-11-19

## 2020-05-14 MED ORDER — GABAPENTIN 300 MG PO CAPS: 300.0000 mg | ORAL_CAPSULE | Freq: Two times a day (BID) | ORAL | 0 refills | Status: DC

## 2020-05-14 MED ORDER — ACCU-CHEK GUIDE VI STRP: ORAL_STRIP | 12 refills | Status: DC

## 2020-05-14 MED ORDER — ACCU-CHEK SOFTCLIX LANCET DEV KIT: PACK | 1 refills | Status: DC

## 2020-05-14 NOTE — Assessment & Plan Note (Signed)
Continues having intermittent low back pain, worse in the winter months.  Physical exam was reassuring.  Refilled patient's tramadol and gabapentin.

## 2020-05-14 NOTE — Assessment & Plan Note (Signed)
Blood sugars have ranged from 119-274 since previous visit.  Hemoglobin A1c increased to 12.0 from 9.9 at previous check.  These are fasting blood sugars.  Patient compliant with 20 units of Lantus daily.  Is out of test strips and reports that he cannot pick up his Invokana.  Spoke with pharmacy and we went through making sure all of the orders are correct.  We will have him pick up his Invokana and he will continue taking Metformin 1000 mg twice daily.  We will increase his Lantus to 24 units daily.  He is scheduled to meet with the pharmacy team to discuss further blood glucose management later this week.  Strict return precautions and hypoglycemic precautions given.  Next hemoglobin A1c in 3 months.

## 2020-05-14 NOTE — Progress Notes (Signed)
    SUBJECTIVE:   CHIEF COMPLAINT / HPI:   Diabetes  Patient reports that he has started checking his blood sugars in the morning before he eats rather than after.  His blood sugars have come down but not a considerable amount.  He continues on 20 units of Lantus as well as Metformin.  He is also taking Invokana although he reports he has been out of it recently.  A prescription was sent at the patient's last visit but he reports he has been unable to fill it.  He is also out of lancets.  I discussed this with the pharmacy team and they will be calling the pharmacy regarding the Invokana refill.  Reflux Patient reports that we transitioned him to Pepcid from omeprazole at the previous visit and ever since then every time he lays down at night he gets reflux.  He is requesting to be switched back to his previous medication.   Chronic low back pain Patient takes gabapentin 300 mg as well as intermittent tramadol.  He would like a refill on both of these medications today.  PDMP reviewed for patient and medications refilled.  OBJECTIVE:   BP 118/74   Pulse 84   Wt 158 lb 9.6 oz (71.9 kg)   SpO2 96%   BMI 23.42 kg/m   General: Well-appearing, no acute distress, sitting comfortably in chair Respiratory: Normal work of breathing, lungs clear to auscultation bilaterally Abdomen: Soft, nontender, positive bowel sounds MSK: Ambulates without difficulty, reports intermittent pain and right side of low back which is worse with continued movement.   ASSESSMENT/PLAN:   Type II diabetes mellitus with manifestations, uncontrolled (HCC) Blood sugars have ranged from 119-274 since previous visit.  Hemoglobin A1c increased to 12.0 from 9.9 at previous check.  These are fasting blood sugars.  Patient compliant with 20 units of Lantus daily.  Is out of test strips and reports that he cannot pick up his Invokana.  Spoke with pharmacy and we went through making sure all of the orders are correct.  We will  have him pick up his Invokana and he will continue taking Metformin 1000 mg twice daily.  We will increase his Lantus to 24 units daily.  He is scheduled to meet with the pharmacy team to discuss further blood glucose management later this week.  Strict return precautions and hypoglycemic precautions given.  Next hemoglobin A1c in 3 months.  PUD (peptic ulcer disease) Refill prescription of Prilosec, canceled Pepcid because patient reports this was not beneficial.  Back pain Continues having intermittent low back pain, worse in the winter months.  Physical exam was reassuring.  Refilled patient's tramadol and gabapentin.     Derrel Nip, MD [facility]

## 2020-05-14 NOTE — Patient Instructions (Signed)
It was great seeing you today.  Your sugars are slightly improved from what they were before but your hemoglobin A1c has increased to 12 from a 9.9.  I want to increase your Lantus to 24 units a day.  Your Invokana refill was previously sent.  We will follow-up to see what the hold-up is on why you cannot get it filled.  I would like for you to meet with our pharmacy team regarding your diabetes management to determine what the best combination of medications will be for you.  If you have any issues with low blood sugars please let us know.  I hope you have a wonderful afternoon!

## 2020-05-14 NOTE — Assessment & Plan Note (Signed)
Refill prescription of Prilosec, canceled Pepcid because patient reports this was not beneficial.

## 2020-05-16 ENCOUNTER — Other Ambulatory Visit: Payer: Self-pay

## 2020-05-16 ENCOUNTER — Telehealth: Payer: Self-pay | Admitting: Pharmacist

## 2020-05-16 ENCOUNTER — Ambulatory Visit (INDEPENDENT_AMBULATORY_CARE_PROVIDER_SITE_OTHER): Payer: Medicare Other | Admitting: Pharmacist

## 2020-05-16 DIAGNOSIS — E118 Type 2 diabetes mellitus with unspecified complications: Secondary | ICD-10-CM

## 2020-05-16 DIAGNOSIS — IMO0002 Reserved for concepts with insufficient information to code with codable children: Secondary | ICD-10-CM

## 2020-05-16 DIAGNOSIS — E1165 Type 2 diabetes mellitus with hyperglycemia: Secondary | ICD-10-CM

## 2020-05-16 MED ORDER — EMPAGLIFLOZIN 10 MG PO TABS: 10.0000 mg | ORAL_TABLET | Freq: Every day | ORAL | 3 refills | Status: DC

## 2020-05-16 NOTE — Telephone Encounter (Signed)
Called pharmacy and they verified that patient picked up new glucometer and supplies. Instructed patient to bring to visit today. Pharmacy also verified patient never picked up canagliflozin (Invokana) 300mg  that was sent d/t it requiring PA. Insurance prefers ran a test prescription and it was no cost to patient. Will recommend switching to Jardiance.

## 2020-05-16 NOTE — Progress Notes (Signed)
Subjective:    Patient ID: Mitchell Page, male    DOB: [birth year], 65 y.o.   MRN: [id number]  HPI  Patient is a 65 y.o. male who presents for diabetes management. Patient arrives in good spirits. Patient was referred and last seen by Primary Care Provider on 05/14/20.  Patient stated he picked up his new glucometer sent in by Dr. Nobie Putnam on Monday evening and has had no issues utilizing it.  PMH significant for HTN.   Patient reports diabetes was diagnosed in 2000.   Insurance coverage/medication affordability: Medicare  Family/Social history: patient lives with his wife  Patient reports adherence with medications.  Current diabetes medications include: Insulin glargine (Lantus) 24 units daily, Empagliflozin (Jardiance) 10mg ; previously prescribed canagliflozin (Invokana) 300mg  but no longer covered on insurance, metformin 500mg  2 tablets twice daily (total 2000mg )  Patient reported dietary habits:  Eats 2-3 meals/day and 1 snack(s)/day; is not on bolus insulin Breakfast: Crepe, grits, or cereal Lunch: Rice Dinner: Fufu Snacks: chips or some form of leftover food in the fridge Drinks: Green tea + packet of splenda, water, 1% milk  Patient-reported exercise habits: goes to gym daily   Patient denies hypoglycemic events. Patient denies polyuria (increased urination).  Patient denies polyphagia (increased appetite).  Patient denies polydipsia (increased thirst).  Patient denies neuropathy (nerve pain). Patient denies visual changes. Patient denies self foot exams.   Home fasting blood sugars: 187, 178 2 hour post-meal/random blood sugars: 419, 223   Objective:   Labs:   Physical Exam  ROS  Lab Results  Component Value Date   HGBA1C 12.0 (A) 05/14/2020   HGBA1C 9.9 (A) 02/01/2020   HGBA1C 8.2 (A) 02/23/2019    There were no vitals filed for this visit.  Lab Results  Component Value Date   MICRALBCREAT 5.7 06/01/2009    Lipid Panel     Component  Value Date/Time   CHOL 155 02/01/2020 1335   TRIG 82 02/01/2020 1335   HDL 51 02/01/2020 1335   CHOLHDL 3.0 02/01/2020 1335   CHOLHDL 2.7 07/07/2014 1621   VLDL 10 07/07/2014 1621   LDLCALC 88 02/01/2020 1335    Clinical Atherosclerotic Cardiovascular Disease (ASCVD): No  The 10-year ASCVD risk score 02/03/2020 DC Jr., et al., 2013) is: 23.1%   Values used to calculate the score:     Age: 52 years     Sex: Male     Is Non-Hispanic African American: Yes     Diabetic: Yes     Tobacco smoker: No     Systolic Blood Pressure: 118 mmHg     Is BP treated: Yes     HDL Cholesterol: 51 mg/dL     Total Cholesterol: 155 mg/dL   PHQ-9 Score: did not complete  Assessment/Plan:   T2DM longstanding and is not controlled based off of increased A1C likely due to diet that is high in carbohydrates. Medication adherence appears optimal. Additional pharmacotherapy is not appropriate at this time as Lantus was just increased 4 units at appt on 2/28 and patient will begin Jardiance today . Following instruction patient verbalized understanding of treatment plan.   1. Continued basal insulin glargine (Lantus) 24 units daily 2. Switched SGLT2-I canagliflozin (Invokana) 300mg  to empagliflozin (Jardiance) 10mg  d/t insurance coverage.  3. Extensively discussed pathophysiology of diabetes, recommended lifestyle interventions, dietary effects on blood sugar control 4. Patient will adhere to dietary modifications and track how certain foods increase his blood glucose levels 5. Counseled on s/sx of and management of  hypoglycemia 6. Counseled patient on importance of switching lancets between fingerstick and rotating insulin injection sites 7. Next A1C anticipated 3 months.   Follow-up appointment 2 weeks to review sugar readings. Written patient instructions provided.  This appointment required 60 minutes of patient care (this includes precharting, chart review, review of results, and face-to-face care).  Thank  you for involving pharmacy to assist in providing this patient's care.  Patient seen with Coralyn Helling, pharmacy student.

## 2020-05-16 NOTE — Patient Instructions (Signed)
Mitchell Page it was a pleasure seeing you today.   Please do the following:  1. Please pick up new prescriptions at [facility]. The new medications you will be starting are Jardiance 10mg  and Amlodipine 10mg . When you start taking amlodipine 10mg  STOP taking amlodipine 5mg . If you have any questions or if you believe something has occurred because of this change, call me or your doctor to let one of know.  2. Continue checking blood sugars at home. It's really important that you record these and bring these in to your next doctor's appointment.  3. Continue making the lifestyle changes we've discussed together during our visit. Diet and exercise play a significant role in improving your blood sugars.  4. Follow-up with me in two weeks.    Hypoglycemia or low blood sugar:   Low blood sugar can happen quickly and may become an emergency if not treated right away.   While this shouldn't happen often, it can be brought upon if you skip a meal or do not eat enough. Also, if your insulin or other diabetes medications are dosed too high, this can cause your blood sugar to go to low.   Warning signs of low blood sugar include: 1. Feeling shaky or dizzy 2. Feeling weak or tired  3. Excessive hunger 4. Feeling anxious or upset  5. Sweating even when you aren't exercising  What to do if I experience low blood sugar? Follow the Rule of 15 1. Check your blood sugar with your meter. If lower than 70, proceed to step 2.  2. Treat with 15 grams of fast acting carbs which is found in 3-4 glucose tablets. If none are available you can try hard candy, 1 tablespoon of sugar or honey,4 ounces of fruit juice, or 6 ounces of REGULAR soda.  3. Re-check your sugar in 15 minutes. If it is still below 70, do what you did in step 2 again. If your blood sugar has come back up, go ahead and eat a snack or small meal made up of complex carbs (ex. Whole grains) and protein at this time to avoid recurrence of low blood  sugar.   Insulin Injection Instructions, Using Insulin Pens, Adult There are many different types of insulin. The type of insulin that you take may determine how many injections you give yourself and when you need to give the injections. Supplies needed:  Soap and water.  Your insulin pen.  A new needle.  Alcohol wipes.  A disposal container for sharp items (sharps container), such as an empty plastic bottle with a cover. How to choose a site for injection The body absorbs insulin differently, depending on where the insulin is injected (injection site). It is best to inject insulin into the same body area each time (for example, always in the abdomen), but you should use a different spot in that area for each injection. Do not inject the insulin in the same spot each time. There are five main areas that can be used for injecting. These areas are:  Abdomen. This is the preferred area.  Front of thigh.  Upper, outer side of thigh.  Upper, outer side of arm.  Upper, outer part of buttock.   How to use an insulin pen Get ready 1. Wash your hands with soap and water. If soap and water are not available, use hand sanitizer. 2. Test your blood sugar (glucose) level and write down that number. Follow any instructions from your health care provider about  what to do if your blood glucose level is higher or lower than your normal range. 3. Check the expiration date and the type of insulin that is in the pen. 4. If you are using CLEAR insulin, check to see that it is clear and free of clumps. 5. If you are using CLOUDY insulin, mix it by gently rolling the insulin pen between your palms several times. Do not shake the pen. 6. Remove the cap from the insulin pen. 7. Use an alcohol wipe to clean the rubber tip of the pen. 8. Remove the protective paper tab from the disposable needle. Do not let the needle touch anything. 9. Screw a new, unused needle onto the pen. 10. Remove the outer  plastic needle cover. Do not throw away the outer plastic cover yet. ? If the pen uses a special safety needle, leave the inner needle shield in place. ? If the pen does not use a special safety needle, remove the inner plastic cover from the needle. 11. Follow the manufacturer's instructions to prime the insulin pen with the volume of insulin needed. Hold the pen with the needle pointing up, and push the button on the opposite end of the pen until a drop of insulin appears at the needle tip. If no insulin appears, repeat this step. 12. Turn the button (dial) to the number of units of insulin that you will be injecting. Inject the insulin 1. Use an alcohol wipe to clean the site where you will be inserting the needle. Let the site air-dry. 2. Hold the pen in the palm of your writing hand like a pencil. 3. If directed by your health care provider, use your other hand to pinch and hold about 1 inch (2.5 cm) of skin at the injection site. Do not directly touch the cleaned part of the skin. 4. Gently but quickly, use your writing hand to put the needle straight into the skin. Insert the needle at a 45-degree angle or a 90-degree angle (perpendicular) to the skin, as directed by your health care provider. 5. When the needle is completely inserted into the skin, let go of the skin that you are pinching. 6. Use your thumb or index finger of your writing hand to push the top button of the pen all the way to inject the insulin. Continue to hold the pen in place with your writing hand. 7. Wait 10 seconds, then pull the needle straight out of the skin. This will allow all of the insulin to go from the pen and needle into your body. 8. Carefully put the larger (outer) plastic cover of the needle back over the needle, then unscrew the capped needle and discard it in a sharps container, such as an empty plastic bottle with a cover. 9. Put the plastic cap back on the insulin pen.   How to throw away  supplies  Discard all used needles in a sharps container.  Follow the disposal regulations for the area where you live. Do not use any needle more than one time.  Throw away empty disposable pens in the regular trash. Questions to ask your health care provider  How often should I be taking insulin?  How often should I check my blood glucose?  What amount of insulin should I be taking at each time?  What are the side effects?  What should I do if my blood glucose is too high?  What should I do if my blood glucose is too low?  What should I do if I forget to take my insulin?  What number should I call if I have questions? Where to find more information  American Diabetes Association (ADA): www.diabetes.org  Association of Diabetes Care and Education Specialists (ADCES): www.diabeteseducator.org Summary  Before you give yourself an insulin injection, be sure to wash your hands and test your blood sugar level. Write down that number.  Check the expiration date and the type of insulin that is in the pen. The type of insulin that you take may determine how many injections you give yourself and when you need to give the injections.  It is best to inject insulin into the same body area each time (for example, always in the abdomen), but you should use a different spot in that area for each injection.  Do not use a needle more than one time. This information is not intended to replace advice given to you by your health care provider. Make sure you discuss any questions you have with your health care provider. Document Revised: 04/07/2019 Document Reviewed: 04/07/2019 Elsevier Patient Education  2021 [employer].

## 2020-05-16 NOTE — Telephone Encounter (Signed)
Noted and agree. 

## 2020-05-16 NOTE — Addendum Note (Signed)
Addended by: Kathrin Ruddy on: 05/16/2020 01:51 PM   Modules accepted: Orders

## 2020-05-16 NOTE — Telephone Encounter (Signed)
Made aware of need for change in SGLT2 therapy from Invokana to [place].   New prescription for Jardiance (empagliflozin) 10mg  sent to [facility].

## 2020-05-18 ENCOUNTER — Other Ambulatory Visit: Payer: Self-pay

## 2020-05-18 ENCOUNTER — Ambulatory Visit (INDEPENDENT_AMBULATORY_CARE_PROVIDER_SITE_OTHER): Payer: Medicare Other

## 2020-05-18 DIAGNOSIS — M545 Low back pain, unspecified: Secondary | ICD-10-CM

## 2020-05-18 DIAGNOSIS — Z23 Encounter for immunization: Secondary | ICD-10-CM

## 2020-05-18 DIAGNOSIS — G8929 Other chronic pain: Secondary | ICD-10-CM

## 2020-05-30 ENCOUNTER — Ambulatory Visit: Payer: Medicare Other | Admitting: Pharmacist

## 2020-06-04 ENCOUNTER — Other Ambulatory Visit: Payer: Self-pay

## 2020-06-04 ENCOUNTER — Ambulatory Visit (INDEPENDENT_AMBULATORY_CARE_PROVIDER_SITE_OTHER): Payer: Medicare Other | Admitting: Pharmacist

## 2020-06-04 VITALS — BP 138/78 | Wt 160.6 lb

## 2020-06-04 DIAGNOSIS — E1165 Type 2 diabetes mellitus with hyperglycemia: Secondary | ICD-10-CM

## 2020-06-04 DIAGNOSIS — IMO0002 Reserved for concepts with insufficient information to code with codable children: Secondary | ICD-10-CM

## 2020-06-04 DIAGNOSIS — E118 Type 2 diabetes mellitus with unspecified complications: Secondary | ICD-10-CM

## 2020-06-04 NOTE — Patient Instructions (Addendum)
Mr. Barna it was a pleasure seeing you today.   Please do the following:  1. STOP Jardiance 10mg  when you go to as directed today during your appointment. If you have any questions or if you believe something has occurred because of this change, call me or your doctor to let one of [place] know. If your blood sugar remains elevated above 250 while in [place] you may restart [place] only if you are feeling well and not having any diarrhea or throwing up. 2. Continue checking blood sugars at home. It's really important that you record these and bring these in to your next doctor's appointment.  3. Continue making the lifestyle changes we've discussed together during our visit. Diet and exercise play a significant role in improving your blood sugars.  4. Follow-up with me in one month when you return.  Hypoglycemia or low blood sugar:   Low blood sugar can happen quickly and may become an emergency if not treated right away.   While this shouldn't happen often, it can be brought upon if you skip a meal or do not eat enough. Also, if your insulin or other diabetes medications are dosed too high, this can cause your blood sugar to go to low.   Warning signs of low blood sugar include: 1. Feeling shaky or dizzy 2. Feeling weak or tired  3. Excessive hunger 4. Feeling anxious or upset  5. Sweating even when you aren't exercising  What to do if I experience low blood sugar? Follow the Rule of 15 1. Check your blood sugar with your meter. If lower than 70, proceed to step 2.  2. Treat with 15 grams of fast acting carbs which is found in 3-4 glucose tablets. If none are available you can try hard candy, 1 tablespoon of sugar or honey,4 ounces of fruit juice, or 6 ounces of REGULAR soda.  3. Re-check your sugar in 15 minutes. If it is still below 70, do what you did in step 2 again. If your blood sugar has come back up, go ahead and eat a snack or small meal made up of complex carbs  (ex. Whole grains) and protein at this time to avoid recurrence of low blood sugar.

## 2020-06-04 NOTE — Progress Notes (Signed)
Subjective:    Patient ID: Mitchell Page, male    DOB: 1955/04/01, 65 y.o.   MRN: [id number]  HPI Patient is a 65 y.o. male who presents for diabetes management. He is in good spirits and presents without assistance. Patient was referred and last seen by Dr. Nobie Putnam on 05/14/20.  Patient states he picked up his new prescription, Jardiance 10mg , and "loves the medication." He mentions since initiating the medicine, his blood glucose has improved and all of his blood glucose readings have been in the 100's.   PMH significant for HTN.   Patient reports diabetes was diagnosed in 2000.   Insurance coverage/medication affordability: Medicare  Family/Social history: lives with his wife  Current diabetes medications include: Insulin glargine (Lantus) 20 units daily, empagliflozin (jardiance 10mg ), metformin 500mg  2 tablets twice daily (total 2000mg ) Current antihypertensive medications: losartan 50mg  Current hyperlipidemic medications: atorvastatin 40mg  Patient states that he is taking his medications as prescribed. Patient reports adherence with medications. He states that he does not miss doses of his medications.  Does you feel that your medications are working for you?  yes  Have you been experiencing any side effects to the medications prescribed? no  Do you have any problems obtaining medications due to transportation or finances?  no     Patient reported dietary habits:  Patient reports he is traveling to [place] in one week to begin a 30 day fast for Ramadan. During that time he will go 13-15 hours without drinking or eating anything, including water. The fast lasts from around 5 AM to 7:30 PM daily.   Patient reports hypoglycemic events. Patient reports he had one hypoglycemic episode where he did not feel well and felt shaky. He reports checking his blood glucose and that it was 90. He consumed some juice and felt better afterwards. Patient denies polyuria (increased  urination).  Patient denies polyphagia (increased appetite).  Patient denies polydipsia (increased thirst).  Patient denies neuropathy (nerve pain). Patient denies visual changes. Patient reports self foot exams.   14 day blood glucose avg: 149  Objective:   Labs:   Today's Vitals   06/04/20 1415  BP: 138/78  Weight: 160 lb 9.6 oz (72.8 kg)   Body mass index is 23.72 kg/m.;  Lab Results  Component Value Date   HGBA1C 12.0 (A) 05/14/2020   HGBA1C 9.9 (A) 02/01/2020   HGBA1C 8.2 (A) 02/23/2019    Vitals:   06/04/20 1415  BP: 138/78    Lab Results  Component Value Date   MICRALBCREAT 5.7 06/01/2009    Lipid Panel     Component Value Date/Time   CHOL 155 02/01/2020 1335   TRIG 82 02/01/2020 1335   HDL 51 02/01/2020 1335   CHOLHDL 3.0 02/01/2020 1335   CHOLHDL 2.7 07/07/2014 1621   VLDL 10 07/07/2014 1621   LDLCALC 88 02/01/2020 1335    Clinical Atherosclerotic Cardiovascular Disease (ASCVD): No  The 10-year ASCVD risk score 02/03/2020 DC Jr., et al., 2013) is: 29.9%   Values used to calculate the score:     Age: 5 years     Sex: Male     Is Non-Hispanic African American: Yes     Diabetic: Yes     Tobacco smoker: No     Systolic Blood Pressure: 138 mmHg     Is BP treated: Yes     HDL Cholesterol: 51 mg/dL     Total Cholesterol: 155 mg/dL   PHQ-9 Score: did not complete  Assessment/Plan:  T2DM longstanding is controlled based on home glucometer readings likely due to initiation of Jardiance since last visit. Medication adherence appears optimal. Additional pharmacotherapy is not recommended at this time. Due to high likelihood of dehydration due to heat in [place] as well as no water consumption, recommend patient stop [place] while in [place] for Ramadan. Discussed with patient and will resume Jardiance upon return. Instructed patient that if his blood glucose consistently remains above 250 and he feels well and is not having any vomiting and/or  diarrhea he may resume Jardiance, but must immediately stop he if begins feeling unwell or develops vomiting and/or diarrhea. Following instruction patient verbalized understanding of treatment plan.  1. Continued basal insulin glargine (LANTUS) 20 units daily. Patient will switch to administering in the evening due to fasting schedule during Ramadan.  2. Temporarily stopped SGLT2-I Jardiance (empagliflozin) 10mg  during Ramadan.  3. Extensively discussed pathophysiology of diabetes, recommended lifestyle interventions, dietary effects on blood sugar control 4. Counseled on s/sx of and management of hypoglycemia 5. Next A1C anticipated 06/11/20.   Follow-up appointment in 6 weeks (when patient returns) to review sugar readings. Written patient instructions provided.  This appointment required 40 minutes of patient care (this includes precharting, chart review, review of results, and face-to-face care).  Thank you for involving pharmacy to assist in providing this patient's care.

## 2020-07-23 ENCOUNTER — Other Ambulatory Visit: Payer: Self-pay | Admitting: Family Medicine

## 2020-07-23 DIAGNOSIS — E1165 Type 2 diabetes mellitus with hyperglycemia: Secondary | ICD-10-CM

## 2020-07-23 DIAGNOSIS — IMO0002 Reserved for concepts with insufficient information to code with codable children: Secondary | ICD-10-CM

## 2020-08-01 ENCOUNTER — Ambulatory Visit (INDEPENDENT_AMBULATORY_CARE_PROVIDER_SITE_OTHER): Payer: Medicare Other | Admitting: Pharmacist

## 2020-08-01 ENCOUNTER — Other Ambulatory Visit: Payer: Self-pay

## 2020-08-01 VITALS — BP 124/78 | HR 71 | Wt 153.8 lb

## 2020-08-01 DIAGNOSIS — E1165 Type 2 diabetes mellitus with hyperglycemia: Secondary | ICD-10-CM

## 2020-08-01 DIAGNOSIS — E118 Type 2 diabetes mellitus with unspecified complications: Secondary | ICD-10-CM

## 2020-08-01 DIAGNOSIS — IMO0002 Reserved for concepts with insufficient information to code with codable children: Secondary | ICD-10-CM

## 2020-08-01 DIAGNOSIS — I10 Essential (primary) hypertension: Secondary | ICD-10-CM

## 2020-08-01 NOTE — Patient Instructions (Signed)
Mr. Popoff it was a pleasure seeing you today.   Please do the following:  1. Continue taking your medications as directed today during your appointment. If you have any questions or if you believe something has occurred because of this change, call me or your doctor to let one of [place] know.  2. Continue checking blood sugars at home. It's really important that you record these and bring these in to your next doctor's appointment.  3. Continue making the lifestyle changes we've discussed together during our visit. Diet and exercise play a significant role in improving your blood sugars.  4. Follow-up with me on July 6th.   Hypoglycemia or low blood sugar:   Low blood sugar can happen quickly and may become an emergency if not treated right away.   While this shouldn't happen often, it can be brought upon if you skip a meal or do not eat enough. Also, if your insulin or other diabetes medications are dosed too high, this can cause your blood sugar to go to low.   Warning signs of low blood sugar include: 1. Feeling shaky or dizzy 2. Feeling weak or tired  3. Excessive hunger 4. Feeling anxious or upset  5. Sweating even when you aren't exercising  What to do if I experience low blood sugar? Follow the Rule of 15 1. Check your blood sugar with your meter. If lower than 70, proceed to step 2.  2. Treat with 15 grams of fast acting carbs which is found in 3-4 glucose tablets. If none are available you can try hard candy, 1 tablespoon of sugar or honey,4 ounces of fruit juice, or 6 ounces of REGULAR soda.  3. Re-check your sugar in 15 minutes. If it is still below 70, do what you did in step 2 again. If your blood sugar has come back up, go ahead and eat a snack or small meal made up of complex carbs (ex. Whole grains) and protein at this time to avoid recurrence of low blood sugar.

## 2020-08-01 NOTE — Progress Notes (Signed)
Subjective:    Patient ID: Mitchell Page, male    DOB: 08/04/1955, 65 y.o.   MRN: [id number]  HPI Patient is a 65 y.o. male who presents for diabetes management. He is in good spirits and presents without assistance. Patient was referred and last seen by provider, Dr. Nobie Putnam, on 05/14/20.  At last follow-up visit patient was leaving to go to [place] for Ramadan. At that time we discontinued Jardiance and the plan was to re-start medication when patient returned to clinic. Patient states that he was only able to go to [place] for 14 days as opposed to the 30 he was planning to go for. He mentions though that during this time his blood sugars were elevated as he was mostly consuming fruit. Since being back home he states they have improved again and he re-started himself on Jardiance 10mg .   Insurance coverage/medication affordability: Medicare  Current diabetes medications include: metformin 500mg  2 tabs BID, empagliflozin (Jardiance) 10mg , insulin glargine (Lantus) 20 units once daily Current hypertension medications include: amlodipine 10mg , losartan 50mg  Current hyperlipidemia medications include: atorvastatin 40mg  Patient states that He is taking his medications as prescribed. Patient reports adherence with medications. Patient states that He does not miss doses of his medications.  Does you feel that your medications are working for you?  yes  Have you been experiencing any side effects to the medications prescribed? no  Do you have any problems obtaining medications due to transportation or finances?  no     Patient reported dietary habits:  Eats 3 meals/day; Does not use bolus insulin Breakfast: eggs, potato Lunch: "depends on what I am feeling" Dinner: typically fufu Drinks: water   Patient denies hypoglycemic events. Patient denies polyuria (increased urination).  Patient denies polyphagia (increased appetite).  Patient denies polydipsia (increased thirst).   Patient denies neuropathy (nerve pain). Patient denies visual changes. Patient denies self foot exams.   30 day blood glucose average: 208 Since appt on 06/04/20 blood glucose range: 134-441  Objective:   Labs:   Physical Exam Musculoskeletal:        General: No swelling.     Right lower leg: No edema.     Left lower leg: No edema.     Review of Systems  Cardiovascular: Negative for leg swelling.    Lab Results  Component Value Date   HGBA1C 12.0 (A) 05/14/2020   HGBA1C 9.9 (A) 02/01/2020   HGBA1C 8.2 (A) 02/23/2019    Vitals:   08/01/20 1448  BP: 124/78  Pulse: 71  SpO2: 97%    Lab Results  Component Value Date   MICRALBCREAT 5.7 06/01/2009    Lipid Panel     Component Value Date/Time   CHOL 155 02/01/2020 1335   TRIG 82 02/01/2020 1335   HDL 51 02/01/2020 1335   CHOLHDL 3.0 02/01/2020 1335   CHOLHDL 2.7 07/07/2014 1621   VLDL 10 07/07/2014 1621   LDLCALC 88 02/01/2020 1335    Clinical Atherosclerotic Cardiovascular Disease (ASCVD): No  The 10-year ASCVD risk score 02/03/2020 DC Jr., et al., 2013) is: 25.1%   Values used to calculate the score:     Age: 79 years     Sex: Male     Is Non-Hispanic African American: Yes     Diabetic: Yes     Tobacco smoker: No     Systolic Blood Pressure: 124 mmHg     Is BP treated: Yes     HDL Cholesterol: 51 mg/dL  Total Cholesterol: 155 mg/dL   PHQ-9 Score: did not complete  Assessment/Plan:   T2DM is not controlled likely due to recent changes in lifestyle and medications during Ramadan, however, most recent blood glucose readings demonstrate improvement. Medication adherence appears optimal. Additional pharmacotherapy is not warranted as patient just restarted Jardiance. Following instruction patient verbalized understanding of treatment plan.    1. Continued basal insulin glargine (Lantus) 20 units once daily.  2. Continued SGLT2-I empagliflozin (Jardiance) 10 mg. 3. Extensively discussed pathophysiology  of diabetes, dietary effects on blood sugar control, and recommended lifestyle interventions. 4. Counseled on s/sx of and management of hypoglycemia 5. Next A1C anticipated at next follow-up visit. 6. Obtain BMP at next visit.  ASCVD risk - primary prevention in patient with diabetes. Last LDL is controlled. ASCVD risk score is >20%  - high intensity statin indicated. Aspirin is not indicated.   1. Continued atorvastatin 40 mg.   Hypertension longstanding currently controlled.  Blood pressure goal <130/80 mmHg. Medication adherence appears optimal.    1. Continue current antihypertensive therapy.  Follow-up appointment 6 weeks to review sugar readings. Written patient instructions provided.  This appointment required 45 minutes of patient care (this includes precharting, chart review, review of results, and face-to-face care).  Thank you for involving pharmacy to assist in providing this patient's care.  Patient seen with Gaye Alken PharmD Candidate

## 2020-08-07 NOTE — Progress Notes (Signed)
    SUBJECTIVE:   CHIEF COMPLAINT / HPI:   Acute on Chronic Back Pain  Chronic since 2007. Surgery in 2007. Lumbar laminectomy L4-L5. In March, travelled to [place], she reports increased back pain after sitting for a long time on the plane (returned April ).  Pain is located in the middle lower back. There is no radiation of pain, no shooting pain down his legs. Pain with sitting for too long or standing too long. Patient is unable to explain quality of pain. Tried ibuprofen with past back pain with improvement about two years ago. Also uses tramadol - previously BID, but since last year, take s once daily. Has been in physical therapy after surgery, but not recently. Does exercise with gentle walking 30-40 minutes a day which does not hurt his back. But with more vigorous acitivity, he does have pain.  Denies lower extremity numbness/tingling/weakness of lower extremities, bladder incontinence/retention, dysuria, bowel incontinence, saddle anesthesia. Denies fever, chills, IV drug use, or hx of cancer, weight loss.   Patient also complaing of not feeling well since yesterday. Reports polyarthralgias and overall malaise. No cough, congestion, SOB, headaches, CP, N/V.   PERTINENT  PMH / PSH: HTN, BPH, T2DM, chronic back pain   OBJECTIVE:   BP 102/60   Pulse 88   Ht 5\' 9"  (1.753 m)   Wt 152 lb 9.6 oz (69.2 kg)   SpO2 95%   BMI 22.54 kg/m   General: Nontoxic, overall well-appearing, mildly fatigued Low back: Tenderness to palpation over vertical midline surgical scar over L4-L5.  He also has tenderness to palpation lateral L4-L5 to paraspinal muscles.  Straight leg raise negative.  Patient has 5 out of 5 strength in lower extremities.  1+, symmetric patellar and Achilles reflexes. Sensation intact bilaterally.    ASSESSMENT/PLAN:   Acute on chronic low back pain The patient does not have any red flag symptoms concerning for cauda equina.  No symptoms consistent with radiculopathy.   Given midline tenderness, will obtain lumbar x-rays. Differential also includes lumbar strain, hx of lumbar laminectomy.  - refill tramadol  - referral to PT given chronic issue  - lumbar x rays   [place]  COVID testing today. No fevers or other symptoms at this time.   Diabetes Received message from [facility] asking to obtain BMP and A1C at patient's next visit. Will obtain today    [facility], MD Minimally Invasive Surgery [facility]

## 2020-08-08 ENCOUNTER — Ambulatory Visit (INDEPENDENT_AMBULATORY_CARE_PROVIDER_SITE_OTHER): Payer: Medicare Other | Admitting: Family Medicine

## 2020-08-08 ENCOUNTER — Encounter: Payer: Self-pay | Admitting: Family Medicine

## 2020-08-08 ENCOUNTER — Other Ambulatory Visit: Payer: Self-pay

## 2020-08-08 VITALS — BP 102/60 | HR 88 | Ht 69.0 in | Wt 152.6 lb

## 2020-08-08 DIAGNOSIS — M545 Low back pain, unspecified: Secondary | ICD-10-CM

## 2020-08-08 DIAGNOSIS — IMO0002 Reserved for concepts with insufficient information to code with codable children: Secondary | ICD-10-CM

## 2020-08-08 DIAGNOSIS — E118 Type 2 diabetes mellitus with unspecified complications: Secondary | ICD-10-CM

## 2020-08-08 DIAGNOSIS — R5381 Other malaise: Secondary | ICD-10-CM | POA: Diagnosis not present

## 2020-08-08 DIAGNOSIS — E1165 Type 2 diabetes mellitus with hyperglycemia: Secondary | ICD-10-CM

## 2020-08-08 DIAGNOSIS — G8929 Other chronic pain: Secondary | ICD-10-CM | POA: Diagnosis not present

## 2020-08-08 LAB — POCT GLYCOSYLATED HEMOGLOBIN (HGB A1C): HbA1c, POC (controlled diabetic range): 9.1 % — AB (ref 0.0–7.0)

## 2020-08-08 MED ORDER — TRAMADOL HCL 50 MG PO TABS: ORAL_TABLET | ORAL | 0 refills | Status: DC

## 2020-08-08 NOTE — Patient Instructions (Signed)
Low back pain I have sent you a referral for physical therapy.  If you are not improving with physical therapy within a few weeks, please let us know. We are also going to get a lumbar x-ray today. We may have you follow-up with your neurosurgeon if you are not improving with physical therapy or if there is anything abnormal on the x-ray I have also refilled your tramadol

## 2020-08-09 LAB — BASIC METABOLIC PANEL
BUN/Creatinine Ratio: 9 — ABNORMAL LOW (ref 10–24)
BUN: 9 mg/dL (ref 8–27)
CO2: 22 mmol/L (ref 20–29)
Calcium: 9.7 mg/dL (ref 8.6–10.2)
Chloride: 101 mmol/L (ref 96–106)
Creatinine, Ser: 1.02 mg/dL (ref 0.76–1.27)
Glucose: 203 mg/dL — ABNORMAL HIGH (ref 65–99)
Potassium: 4.1 mmol/L (ref 3.5–5.2)
Sodium: 138 mmol/L (ref 134–144)
eGFR: 82 mL/min/{1.73_m2} (ref >59)

## 2020-08-10 LAB — NOVEL CORONAVIRUS, NAA

## 2020-08-14 ENCOUNTER — Encounter: Payer: Self-pay | Admitting: Family Medicine

## 2020-08-14 ENCOUNTER — Other Ambulatory Visit: Payer: Self-pay

## 2020-08-14 ENCOUNTER — Ambulatory Visit
Admission: RE | Admit: 2020-08-14 | Discharge: 2020-08-14 | Disposition: A | Discharge status: Home / Self Care | Payer: Medicare Other | Source: Ambulatory Visit | Attending: Family Medicine | Admitting: Family Medicine

## 2020-08-14 DIAGNOSIS — G8929 Other chronic pain: Secondary | ICD-10-CM

## 2020-08-14 DIAGNOSIS — M545 Low back pain, unspecified: Secondary | ICD-10-CM

## 2020-08-16 ENCOUNTER — Other Ambulatory Visit: Payer: Self-pay | Admitting: Family Medicine

## 2020-08-16 DIAGNOSIS — E1165 Type 2 diabetes mellitus with hyperglycemia: Secondary | ICD-10-CM

## 2020-08-16 DIAGNOSIS — IMO0002 Reserved for concepts with insufficient information to code with codable children: Secondary | ICD-10-CM

## 2020-08-21 ENCOUNTER — Ambulatory Visit: Payer: Medicare Other | Attending: Family Medicine

## 2020-08-21 ENCOUNTER — Other Ambulatory Visit: Payer: Self-pay

## 2020-08-21 ENCOUNTER — Ambulatory Visit: Payer: Medicare Other

## 2020-08-21 DIAGNOSIS — M545 Low back pain, unspecified: Secondary | ICD-10-CM | POA: Diagnosis present

## 2020-08-21 DIAGNOSIS — G8929 Other chronic pain: Secondary | ICD-10-CM | POA: Diagnosis present

## 2020-08-21 DIAGNOSIS — M6281 Muscle weakness (generalized): Secondary | ICD-10-CM | POA: Diagnosis present

## 2020-08-21 NOTE — Therapy (Addendum)
[facility] [address] [place], [place], [address] Phone: [phone]   Fax:  [phone]  Physical Therapy Evaluation  Patient Details  Name: Mitchell Page MRN: [id number] Date of Birth: 1955/07/17 Referring Provider (PT): Doreene Eland, MD   Encounter Date: 08/21/2020   PT End of Session - 08/21/20 1238    Visit Number 1    Number of Visits 17    Date for PT Re-Evaluation 10/16/20    Authorization Type [employer] MCD - 27 annual visits    Authorization - Number of Visits 27    PT Start Time 1148    PT Stop Time 1230    PT Time Calculation (min) 42 min    Activity Tolerance Patient tolerated treatment well    Behavior During Therapy WFL for tasks assessed/performed           Past Medical History:  Diagnosis Date  . Allergic rhinitis   . Chronic headache   . Cough 04/21/2017  . Diabetes mellitus   . GERD (gastroesophageal reflux disease)   . Hyperlipidemia   . Hypertension   . Illiteracy and low-level literacy    Unclear of his literacy  in his native tongue, however, he is unable to read in [place]  . Lower back pain    w/ R LE sciatica s/p laminectomy in 2008. MRI- 04/2008    Past Surgical History:  Procedure Laterality Date  . LUMBAR LAMINECTOMY  2008   l4-5  . Repair of left arm fracture      There were no vitals filed for this visit.    Subjective Assessment - 08/21/20 1150    Subjective Mitchell Page reports that he underwent surgery of laminectomy L4-5 in 2008.  He states that he has been taking Tramadol since then.  He has to take the medication for the pain daily.  The patient reports a constant pain in the low back. The pain is located in the middle.  Prolong activities will aggravate his syptoms.  He has difficulty getting out of bed in the morning.  He will have difficulty standing after sitting over 20 minutes.  The patient denies numbness and tingling in the back or legs.    Limitations  Sitting;Lifting;Standing    How long can you sit comfortably? 20-30 minutes    How long can you stand comfortably? 30-40 minutes    How long can you walk comfortably? 40 minutes to 1 hour    Patient Stated Goals To reduce the pain, increase tolerance for sitting and standing.    Currently in Pain? Yes    Pain Score 4    7-8/10 with taking medication (4/10 with taking medication)   Pain Location Back    Pain Orientation Left;Right    Pain Descriptors / Indicators Other (Comment)   unable to give descriptor as English is his second language   Pain Type Chronic pain    Pain Onset More than a month ago    Pain Frequency Constant    Aggravating Factors  Prolong siting, prolong standing, lifting over 20-30 pounds.    Pain Relieving Factors Medication    Effect of Pain on Daily Activities Difficulty with transfers (sit to stand), limited with lifting, Difficulty with traveling              [facility] PT Assessment - 08/21/20 0001      Assessment   Medical Diagnosis Chronic Low Back Pain    Referring Provider (PT) Doreene Eland, MD  Onset Date/Surgical Date --   2008   Hand Dominance Right    Next MD Visit 09/19/20    Prior Therapy Yes      Precautions   Precautions None      Restrictions   Weight Bearing Restrictions No      Balance Screen   Has the patient fallen in the past 6 months No    Has the patient had a decrease in activity level because of a fear of falling?  No    Is the patient reluctant to leave their home because of a fear of falling?  No      Home Environment   Living Environment Private residence    Living Arrangements Spouse/significant other    Type of Home Apartment    Home Layout Two level      Prior Function   Level of Independence Independent with basic ADLs    Vocation On disability    Leisure Travel [place] to see family.      Cognition   Overall Cognitive Status Within Functional Limits for tasks assessed      Posture/Postural Control    Posture Comments Reduction of Lumbar lordosis      ROM / Strength   AROM / PROM / Strength Strength;AROM      AROM   Lumbar Flexion 100%    Lumbar Extension 80%    Lumbar - Right Side Bend To knee    Lumbar - Left Side Bend To knee    Lumbar - Right Rotation 100%    Lumbar - Left Rotation 100%      Strength   Overall Strength Within functional limits for tasks performed;Other (comment)    Overall Strength Comments Gross LE Strength 5/5    Lumbar Flexion 3+/5    Lumbar Extension 4-/5      Palpation   Spinal mobility L4, L5 hypermobile and pain w/ assessment                      Objective measurements completed on examination: See above findings.       [facility] Adult PT Treatment/Exercise - 08/21/20 0001      Lumbar Exercises: Stretches   Lower Trunk Rotation 20 seconds;3 reps    Pelvic Tilt 10 reps;5 seconds      Lumbar Exercises: Supine   Bridge with [employer] 15 reps                  PT Education - 08/21/20 1221    Education Details Diagnosis/POC, HEP,    Person(s) Educated Patient    Methods Explanation;Handout    Comprehension Verbalized understanding            PT Short Term Goals - 08/21/20 1239      PT SHORT TERM GOAL #1   Title The patient will be independent in a basic HEP    Time 2    Period Weeks    Status New    Target Date 09/04/20             PT Long Term Goals - 08/21/20 1239      PT LONG TERM GOAL #1   Title The patient will be able to sit for 1 hour and reports pain with standing < 4/10 with taking pain medicaton.    Baseline 20 minutes sitting and pain 8/10 without medication    Time 8    Period Weeks    Status New  Target Date 10/16/20      PT LONG TERM GOAL #2   Title The patient will present with improvement of lumbar flexion and extension strength to 4/5 for lifting.    Baseline 3+ to 4- out of 5    Time 8    Period Weeks    Target Date 10/16/20      PT LONG TERM GOAL #3   Title The patient  will be able to stand for 1 hour with low back pain <5/10 without taking pain medication.    Baseline 40 minutes 8/10 without pain medicaton    Time 8    Period Weeks    Status New    Target Date 10/16/20                  Plan - 08/21/20 1205    Clinical Impression Statement Mitchell Page is a 65 y/o male who reports having low back since 2008.  He underwent L4-5 laminectomy at that time.  He has been taking Tamadol daily since then.  He reports that he has to take the medication to control his pain.  The patient presents with hypermobility at L4-5 and reduction of core strength.  The patient has functional lumbar AROM in standing and lower extremity gross strength at 5/5.  The patient is impaired transfers after prolong sitting and laying down.  He is limited with prolong standing and lifts as well.  Recommend physical therapy for core stabilization and lumbar mobility to reduce dependency on pain medication and improve functonal activities.    Personal Factors and Comorbidities Comorbidity 1    Comorbidities DM    Examination-Activity Limitations Carry;Transfers;Lift;Other   Prolong standing and sitting   Examination-Participation Restrictions Driving;Cleaning;Yard Work    Stability/Clinical Decision Making Stable/Uncomplicated    Clinical Decision Making Low    Rehab Potential Good    PT Frequency 2x / week    PT Duration 8 weeks    PT Treatment/Interventions ADLs/Self Care Home Management;Cryotherapy;Electrical Stimulation;Stair training;Functional mobility training;Traction;Therapeutic activities;Moist Heat;Therapeutic exercise;Neuromuscular re-education;Balance training;Manual techniques;Patient/family education;Passive range of motion;Dry needling;Spinal Manipulations;Joint Manipulations    PT Next Visit Plan Review HEP, spinal joint mobs, core stabilization    PT Home Exercise Plan Access Code: [email or web address]    Consulted and Agree with Plan of Care Patient           Patient will  benefit from skilled therapeutic intervention in order to improve the following deficits and impairments:  Pain,Hypermobility,Decreased activity tolerance,Decreased strength,Postural dysfunction  Visit Diagnosis: Chronic bilateral low back pain without sciatica - Plan: PT plan of care cert/re-cert  Muscle weakness (generalized) - Plan: PT plan of care cert/re-cert     Problem List Patient Active Problem List   Diagnosis Date Noted  . Nonspecific chest pain 04/15/2019  . Costochondral chest pain 04/15/2019  . Palpitations 07/20/2018  . Need for immunization against influenza 03/12/2018  . Medication refill 08/26/2017  . Pain in joint of right shoulder 07/08/2017  . Rotator cuff tendonitis, left 05/05/2017  . BPH (benign prostatic hyperplasia) 01/19/2017  . Myalgia 12/16/2016  . Insomnia 12/03/2016  . Chronic, continuous use of opioids 03/18/2016  . Neuropathic pain of left forearm 11/09/2015  . Healthcare maintenance 11/09/2013  . Erectile dysfunction 12/10/2012  . PUD (peptic ulcer disease) 06/19/2010  . Headache 09/13/2009  . Allergic rhinitis 09/11/2009  . Essential hypertension 06/01/2009  . Back pain 09/15/2008  . Type II diabetes mellitus with manifestations, uncontrolled (HCC) 09/16/1999   Sharol Roussel, PT, DPT, OCS, Crt.  DN Robet LeuJenni L Shawntee Mainwaring 08/21/2020, 1:07 PM  Check all possible CPT codes: [id number]- Therapeutic Exercise, [phone]- Neuro Re-education, [phone] - Gait Training, [phone] - Manual Therapy, 97530 - Therapeutic Activities, [phone] - Self Care, [phone] - Mechanical traction, 97014 - Electrical stimulation (unattended), [phone] - Electrical stimulation (Manual) and [phone] - Aquatic therapy        [facility] [address] [place], [place], [address] Phone: [phone]   Fax:  [phone]  Name: Dossie DerMohammed Page MRN: [id number] Date of Birth: 05-Oct-1955

## 2020-08-21 NOTE — Patient Instructions (Signed)
Access Code: [email or web address] URL: [email or web address] Date: 08/21/2020 Prepared by: Sharol Roussel  Exercises Supine Posterior Pelvic Tilt - 1 x daily - 7 x weekly - 1 sets - 10 reps - 3 hold Supine Bridge with Mini Swiss Ball Between Knees - 1 x daily - 7 x weekly - 1 sets - 15 reps - 3 hold Supine Lower Trunk Rotation - 1 x daily - 7 x weekly - 1 sets - 3 reps - 20 hold

## 2020-08-23 ENCOUNTER — Other Ambulatory Visit: Payer: Self-pay

## 2020-08-23 ENCOUNTER — Ambulatory Visit (INDEPENDENT_AMBULATORY_CARE_PROVIDER_SITE_OTHER): Payer: Medicare Other | Admitting: Family Medicine

## 2020-08-23 VITALS — BP 108/70 | HR 68 | Ht 69.0 in | Wt 153.0 lb

## 2020-08-23 DIAGNOSIS — M6281 Muscle weakness (generalized): Secondary | ICD-10-CM | POA: Diagnosis not present

## 2020-08-23 DIAGNOSIS — R29898 Other symptoms and signs involving the musculoskeletal system: Secondary | ICD-10-CM | POA: Diagnosis not present

## 2020-08-23 NOTE — Patient Instructions (Signed)
I am sending you to physical therapy and Occupational Therapy to help with your significant left hand and arm weakness.  I hope this improves your quality of life and makes her day-to-day activities easier.

## 2020-08-23 NOTE — Assessment & Plan Note (Signed)
Patient's weakness is likely due to to lack of rehab after surgeries.  I am not concerned for CVA, C-spine pathology or carpal tunnel as it is not consistent with his history or physical exam today.  I have sent him a referral to physical and Occupational Therapy to rehab his left upper extremity.  Patient reports pain that happens once every few months, exacerbated by cold and that resolves with ibuprofen or tramadol.  I do not think imaging is necessary at this time.
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
## 2020-08-27 ENCOUNTER — Other Ambulatory Visit: Payer: Self-pay

## 2020-08-27 ENCOUNTER — Ambulatory Visit: Payer: Medicare Other

## 2020-08-27 DIAGNOSIS — G8929 Other chronic pain: Secondary | ICD-10-CM

## 2020-08-27 DIAGNOSIS — M545 Low back pain, unspecified: Secondary | ICD-10-CM | POA: Diagnosis not present

## 2020-08-27 DIAGNOSIS — M6281 Muscle weakness (generalized): Secondary | ICD-10-CM

## 2020-08-27 NOTE — Therapy (Signed)
[facility] [address] [place], [place], [address] Phone: [phone]   Fax:  [phone]  Physical Therapy Treatment  Patient Details  Name: Mitchell Page MRN: [id number] Date of Birth: 1955/08/20 Referring Provider (PT): Doreene Eland, MD   Encounter Date: 08/27/2020   PT End of Session - 08/27/20 0938     Visit Number 2    Number of Visits 17    Date for PT Re-Evaluation 10/16/20    Authorization Type [employer] MCD - 27 annual visits    Authorization - Number of Visits 27    PT Start Time 0933    PT Stop Time 1015    PT Time Calculation (min) 42 min    Activity Tolerance Patient tolerated treatment well    Behavior During Therapy [facility] for tasks assessed/performed             Past Medical History:  Diagnosis Date   Allergic rhinitis    Chronic headache    Cough 04/21/2017   Diabetes mellitus    GERD (gastroesophageal reflux disease)    Hyperlipidemia    Hypertension    Illiteracy and low-level literacy    Unclear of his literacy  in his native tongue, however, he is unable to read in English   Lower back pain    w/ R LE sciatica s/p laminectomy in 2008. MRI- 04/2008    Past Surgical History:  Procedure Laterality Date   LUMBAR LAMINECTOMY  2008   l4-5   Repair of left arm fracture      There were no vitals filed for this visit.   Subjective Assessment - 08/27/20 0932     Subjective The patient reports that he is doing the exercises and his back pain is a bit better.  He had 8/10 pain with getting out of bed in the morning.  He then took his Tramadol and his back pain is now a 5/10.    Limitations Sitting;Lifting;Standing    Patient Stated Goals To reduce the pain, increase tolerance for sitting and standing.    Currently in Pain? Yes    Pain Score 5     Pain Location Back                               [facility] Adult PT Treatment/Exercise - 08/27/20 0001       Lumbar Exercises:  Stretches   Lower Trunk Rotation 20 seconds;3 reps    Pelvic Tilt 10 reps;5 seconds    Other Lumbar Stretch Exercise Sitting FF, FF/Rot w/ SB 20" x 3      Lumbar Exercises: Supine   Bridge with Ball Squeeze 15 reps    Large Ball Abdominal Isometric 10 reps;5 seconds      Lumbar Exercises: Prone   Straight Leg Raise 20 reps      Manual Therapy   Manual Therapy Joint mobilization;Soft tissue mobilization;Manual Traction    Joint Mobilization Prone L5-L2 unilateral PA grade II/III R and L                    PT Education - 08/27/20 1014     Education Details continue with current HEP    Person(s) Educated Patient    Methods Explanation    Comprehension Verbalized understanding              PT Short Term Goals - 08/21/20 1239  PT SHORT TERM GOAL #1   Title The patient will be independent in a basic HEP    Time 2    Period Weeks    Status New    Target Date 09/04/20               PT Long Term Goals - 08/21/20 1239       PT LONG TERM GOAL #1   Title The patient will be able to sit for 1 hour and reports pain with standing < 4/10 with taking pain medicaton.    Baseline 20 minutes sitting and pain 8/10 without medication    Time 8    Period Weeks    Status New    Target Date 10/16/20      PT LONG TERM GOAL #2   Title The patient will present with improvement of lumbar flexion and extension strength to 4/5 for lifting.    Baseline 3+ to 4- out of 5    Time 8    Period Weeks    Target Date 10/16/20      PT LONG TERM GOAL #3   Title The patient will be able to stand for 1 hour with low back pain <5/10 without taking pain medication.    Baseline 40 minutes 8/10 without pain medicaton    Time 8    Period Weeks    Status New    Target Date 10/16/20                   Plan - 08/27/20 1007     Clinical Impression Statement Marc continues to take Tramadol to manage his pain.  He woked up with 8/10 pain in the back and now it is a  5/10 with the medication.  He reports that he is doing the exercises at home.  Progressed core stabilization and stretches today in therapy with supine shoulder press, prone LE lifts, and sitting FF.  The patient reported some pain down to the knees with prone LE lifts.  Recomend modifying to performance in plantigrade position next session.  Recommend continued therapy to reduce low back and need for taking pain medication.    Personal Factors and Comorbidities Comorbidity 1    Comorbidities DM    Examination-Activity Limitations Carry;Transfers;Lift;Other    Examination-Participation Restrictions Driving;Cleaning;Pincus Badder Work    PT Treatment/Interventions ADLs/Self Care Home Management;Cryotherapy;Electrical Stimulation;Stair training;Functional mobility training;Traction;Therapeutic activities;Moist Heat;Therapeutic exercise;Neuromuscular re-education;Balance training;Manual techniques;Patient/family education;Passive range of motion;Dry needling;Spinal Manipulations;Joint Manipulations    PT Next Visit Plan modify prone LE lifts to performance in plantigrade position, continue joint mobs lumbar, progress core stabilization.    PT Home Exercise Plan Access Code: [email or web address]    Consulted and Agree with Plan of Care Patient             Patient will benefit from skilled therapeutic intervention in order to improve the following deficits and impairments:  Pain, Hypermobility, Decreased activity tolerance, Decreased strength, Postural dysfunction  Visit Diagnosis: Chronic bilateral low back pain without sciatica  Muscle weakness (generalized)     Problem List Patient Active Problem List   Diagnosis Date Noted   Muscle weakness of left upper extremity 08/23/2020   Nonspecific chest pain 04/15/2019   Costochondral chest pain 04/15/2019   Palpitations 07/20/2018   Need for immunization against influenza 03/12/2018   Medication refill 08/26/2017   Pain in joint of right shoulder 07/08/2017    Rotator cuff tendonitis, left 05/05/2017   BPH (benign prostatic hyperplasia) 01/19/2017   Myalgia  12/16/2016   Insomnia 12/03/2016   Chronic, continuous use of opioids 03/18/2016   Neuropathic pain of left forearm 11/09/2015   Healthcare maintenance 11/09/2013   Erectile dysfunction 12/10/2012   PUD (peptic ulcer disease) 06/19/2010   Headache 09/13/2009   Allergic rhinitis 09/11/2009   Essential hypertension 06/01/2009   Back pain 09/15/2008   Type II diabetes mellitus with manifestations, uncontrolled (HCC) 09/16/1999   Sharol Roussel, PT, DPT, OCS, Crt. DN  Robet Leu 08/27/2020, 10:21 AM  [facility] [address] [place], [place], [address] Phone: [phone]   Fax:  [phone]  Name: Aviv Lengacher MRN: [id number] Date of Birth: 08/06/1955

## 2020-08-31 ENCOUNTER — Ambulatory Visit: Payer: Medicare Other

## 2020-08-31 ENCOUNTER — Other Ambulatory Visit: Payer: Self-pay

## 2020-08-31 DIAGNOSIS — M6281 Muscle weakness (generalized): Secondary | ICD-10-CM

## 2020-08-31 DIAGNOSIS — M545 Low back pain, unspecified: Secondary | ICD-10-CM

## 2020-08-31 DIAGNOSIS — G8929 Other chronic pain: Secondary | ICD-10-CM

## 2020-08-31 NOTE — Therapy (Signed)
[facility] [address] [place], [place], [address] Phone: [phone]   Fax:  [phone]  Physical Therapy Treatment  Patient Details  Name: Mitchell Page MRN: [id number] Date of Birth: 07-22-1955 Referring Provider (PT): Doreene Eland, MD   Encounter Date: 08/31/2020   PT End of Session - 08/31/20 0856     Visit Number 3    Number of Visits 17    Date for PT Re-Evaluation 10/16/20    Authorization Type [employer] MCD - 27 annual visits    Authorization - Number of Visits 27    PT Start Time 0846    PT Stop Time 0928    PT Time Calculation (min) 42 min    Activity Tolerance Patient tolerated treatment well    Behavior During Therapy [facility] for tasks assessed/performed             Past Medical History:  Diagnosis Date   Allergic rhinitis    Chronic headache    Cough 04/21/2017   Diabetes mellitus    GERD (gastroesophageal reflux disease)    Hyperlipidemia    Hypertension    Illiteracy and low-level literacy    Unclear of his literacy  in his native tongue, however, he is unable to read in English   Lower back pain    w/ R LE sciatica s/p laminectomy in 2008. MRI- 04/2008    Past Surgical History:  Procedure Laterality Date   LUMBAR LAMINECTOMY  2008   l4-5   Repair of left arm fracture      There were no vitals filed for this visit.   Subjective Assessment - 08/31/20 0855     Subjective The patient reports that his back pain is better with the exercises.    Limitations Sitting;Lifting;Standing    Patient Stated Goals To reduce the pain, increase tolerance for sitting and standing.    Currently in Pain? Yes    Pain Score 4     Pain Location Back                [facility] PT Assessment - 08/31/20 0001       Assessment   Medical Diagnosis Chronic Low Back Pain    Referring Provider (PT) Doreene Eland, MD    Next MD Visit 09/19/20                           [facility] Adult PT  Treatment/Exercise - 08/31/20 0001       Lumbar Exercises: Stretches   Pelvic Tilt 10 reps;5 seconds    Other Lumbar Stretch Exercise Sitting FF, FF/Rot w/ SB 20" x 3      Lumbar Exercises: Aerobic   Nustep 5 min level 5      Lumbar Exercises: Standing   Other Standing Lumbar Exercises PPT at wall 3" x 10      Lumbar Exercises: Supine   Bridge with Ball Squeeze 20 reps      Lumbar Exercises: Prone   Straight Leg Raise 20 reps    Straight Leg Raises Limitations in Plantigrade      Manual Therapy   Manual Therapy Joint mobilization;Soft tissue mobilization;Manual Traction    Joint Mobilization Prone L5-L2 unilateral PA grade II/III R and L                      PT Short Term Goals - 08/31/20 5697       PT  SHORT TERM GOAL #1   Title The patient will be independent in a basic HEP    Time 2    Period Weeks    Status Achieved    Target Date 09/04/20               PT Long Term Goals - 08/31/20 0943       PT LONG TERM GOAL #1   Title The patient will be able to sit for 1 hour and reports pain with standing < 4/10 with taking pain medicaton.    Baseline 20 minutes sitting and pain 8/10 without medication    Time 8    Period Weeks    Status On-going      PT LONG TERM GOAL #2   Title The patient will present with improvement of lumbar flexion and extension strength to 4/5 for lifting.    Baseline 3+ to 4- out of 5    Time 8    Period Weeks    Status On-going      PT LONG TERM GOAL #3   Title The patient will be able to stand for 1 hour with low back pain <5/10 without taking pain medication.    Baseline 40 minutes 8/10 without pain medicaton    Time 8    Period Weeks    Status On-going                   Plan - 08/31/20 6712     Clinical Impression Statement The patient arrived to therapy reporting the therapy and exercises are helping.  His back pain is going down.  He rated it at a 4/10 today.  Modified prone left lifts to performance  in plantigrade.  The patient was able to perform without increase of low back pain or soreness.  Performed PPT in standing.  The patient did have increase difficulty with this due to weakness and muscle recruitments.  Recommend continued therapy for reduction of pain medication and pain for ADLs.    Personal Factors and Comorbidities Comorbidity 1    Comorbidities DM    Examination-Activity Limitations Carry;Transfers;Lift;Other    Examination-Participation Restrictions Driving;Cleaning;Pincus Badder Work    PT Treatment/Interventions ADLs/Self Care Home Management;Cryotherapy;Electrical Stimulation;Stair training;Functional mobility training;Traction;Therapeutic activities;Moist Heat;Therapeutic exercise;Neuromuscular re-education;Balance training;Manual techniques;Patient/family education;Passive range of motion;Dry needling;Spinal Manipulations;Joint Manipulations    PT Next Visit Plan Progress HEP, continue joint mobs lumbar, progress core stabilization, hip strengthening    PT Home Exercise Plan Access Code: [email or web address]    Consulted and Agree with Plan of Care Patient             Patient will benefit from skilled therapeutic intervention in order to improve the following deficits and impairments:  Pain, Hypermobility, Decreased activity tolerance, Decreased strength, Postural dysfunction  Visit Diagnosis: Chronic bilateral low back pain without sciatica  Muscle weakness (generalized)     Problem List Patient Active Problem List   Diagnosis Date Noted   Muscle weakness of left upper extremity 08/23/2020   Nonspecific chest pain 04/15/2019   Costochondral chest pain 04/15/2019   Palpitations 07/20/2018   Need for immunization against influenza 03/12/2018   Medication refill 08/26/2017   Pain in joint of right shoulder 07/08/2017   Rotator cuff tendonitis, left 05/05/2017   BPH (benign prostatic hyperplasia) 01/19/2017   Myalgia 12/16/2016   Insomnia 12/03/2016   Chronic, continuous use  of opioids 03/18/2016   Neuropathic pain of left forearm 11/09/2015   Healthcare maintenance 11/09/2013   Erectile dysfunction 12/10/2012  PUD (peptic ulcer disease) 06/19/2010   Headache 09/13/2009   Allergic rhinitis 09/11/2009   Essential hypertension 06/01/2009   Back pain 09/15/2008   Type II diabetes mellitus with manifestations, uncontrolled (HCC) 09/16/1999   Sharol Roussel, PT, DPT, OCS, Crt. DN Robet Leu 08/31/2020, 9:44 AM  [facility] [address] [place], [place], [address] Phone: [phone]   Fax:  [phone]  Name: Maven Varelas MRN: [id number] Date of Birth: January 03, 1956

## 2020-09-05 ENCOUNTER — Ambulatory Visit: Payer: Medicare Other

## 2020-09-07 ENCOUNTER — Ambulatory Visit: Payer: Medicare Other

## 2020-09-07 ENCOUNTER — Other Ambulatory Visit: Payer: Self-pay

## 2020-09-07 DIAGNOSIS — G8929 Other chronic pain: Secondary | ICD-10-CM

## 2020-09-07 DIAGNOSIS — M545 Low back pain, unspecified: Secondary | ICD-10-CM | POA: Diagnosis not present

## 2020-09-07 DIAGNOSIS — M6281 Muscle weakness (generalized): Secondary | ICD-10-CM

## 2020-09-07 NOTE — Therapy (Signed)
[facility] [address] [place], [place], [address] Phone: [phone]   Fax:  [phone]  Physical Therapy Treatment  Patient Details  Name: Kasai Beltran MRN: [id number] Date of Birth: 1955-06-24 Referring Provider (PT): Doreene Eland, MD   Encounter Date: 09/07/2020   PT End of Session - 09/07/20 0926     Visit Number 4    Number of Visits 17    Date for PT Re-Evaluation 10/16/20    Authorization Type [employer] MCD - 27 annual visits    Authorization - Number of Visits 27    PT Start Time 0928    PT Stop Time 1014    PT Time Calculation (min) 46 min    Activity Tolerance Patient tolerated treatment well    Behavior During Therapy [facility] for tasks assessed/performed             Past Medical History:  Diagnosis Date   Allergic rhinitis    Chronic headache    Cough 04/21/2017   Diabetes mellitus    GERD (gastroesophageal reflux disease)    Hyperlipidemia    Hypertension    Illiteracy and low-level literacy    Unclear of his literacy  in his native tongue, however, he is unable to read in English   Lower back pain    w/ R LE sciatica s/p laminectomy in 2008. MRI- 04/2008    Past Surgical History:  Procedure Laterality Date   LUMBAR LAMINECTOMY  2008   l4-5   Repair of left arm fracture      There were no vitals filed for this visit.   Subjective Assessment - 09/07/20 0927     Subjective The pt reports that he has been having tightness in the low back after last session in therapy.  It is on both sides of the back.    Patient Stated Goals To reduce the pain, increase tolerance for sitting and standing.                [facility] PT Assessment - 09/07/20 0001       Palpation   Palpation comment Trigger point tenderness L piriformis                           [facility] Adult PT Treatment/Exercise - 09/07/20 0001       Lumbar Exercises: Stretches   Lower Trunk Rotation 20 seconds;3 reps     Pelvic Tilt 5 seconds;15 reps    Piriformis Stretch 30 seconds;3 reps;Left;Right    Other Lumbar Stretch Exercise Sitting FF, FF/Rot w/ SB 20" x 3      Lumbar Exercises: Standing   Other Standing Lumbar Exercises PPT at wall 3" x 10      Lumbar Exercises: Supine   Clam 15 reps    Clam Limitations Green band, unilaterally    Bridge with [employer] 20 reps    Isometric Hip Flexion 5 seconds;15 reps      Manual Therapy   Manual Therapy Soft tissue mobilization    Joint Mobilization Prone L5-L2 unilateral PA grade II/III R and L, sacral distraction grade III    Soft tissue mobilization TrP release L piriformis                    PT Education - 09/07/20 1001     Education Details Updated HEP    Person(s) Educated Patient    Methods Explanation;Handout    Comprehension Verbalized  understanding              PT Short Term Goals - 09/07/20 0927       PT SHORT TERM GOAL #1   Title The patient will be independent in a basic HEP    Time 2    Period Weeks    Status Achieved    Target Date 09/04/20               PT Long Term Goals - 09/07/20 0927       PT LONG TERM GOAL #1   Title The patient will be able to sit for 1 hour and reports pain with standing < 4/10 with taking pain medicaton.    Baseline 20 minutes sitting and pain 8/10 without medication    Time 8    Period Weeks    Status On-going      PT LONG TERM GOAL #2   Title The patient will present with improvement of lumbar flexion and extension strength to 4/5 for lifting.    Baseline 3+ to 4- out of 5    Time 8    Period Weeks    Status On-going      PT LONG TERM GOAL #3   Title The patient will be able to stand for 1 hour with low back pain <5/10 without taking pain medication.    Baseline 40 minutes 8/10 without pain medicaton    Time 8    Period Weeks    Status On-going                   Plan - 09/07/20 5638     Clinical Impression Statement Ekam arrived to therapy  reporting tightness in the low back after the last session in therapy.  He did present wiht trigger point tenderness to the left piriformis muscle.  Added STM/trigger point release to the treatment plan.  The patient did tolerate that well.  Also added piriformis stretch.  Deferred hip extension exercises in plantigrade.  Advanced the patinet's HEP with hip flexion isometrics, piriformis stretch, and supine unilateral clam shell.  The patient did report improvement of his sympotms post therapy today.  Recommend continued therapy for pain reduction to reduce need for medication for ADLs.    Personal Factors and Comorbidities Comorbidity 1    Comorbidities DM    Examination-Activity Limitations Carry;Transfers;Lift;Other    Examination-Participation Restrictions Driving;Cleaning;Pincus Badder Work    PT Treatment/Interventions ADLs/Self Care Home Management;Cryotherapy;Electrical Stimulation;Stair training;Functional mobility training;Traction;Therapeutic activities;Moist Heat;Therapeutic exercise;Neuromuscular re-education;Balance training;Manual techniques;Patient/family education;Passive range of motion;Dry needling;Spinal Manipulations;Joint Manipulations    PT Next Visit Plan Progress HEP, continue joint mobs lumbar, progress core stabilization, hip strengthening    PT Home Exercise Plan Access Code: [email or web address]    Consulted and Agree with Plan of Care Patient             Patient will benefit from skilled therapeutic intervention in order to improve the following deficits and impairments:  Pain, Hypermobility, Decreased activity tolerance, Decreased strength, Postural dysfunction  Visit Diagnosis: Chronic bilateral low back pain without sciatica  Muscle weakness (generalized)     Problem List Patient Active Problem List   Diagnosis Date Noted   Muscle weakness of left upper extremity 08/23/2020   Nonspecific chest pain 04/15/2019   Costochondral chest pain 04/15/2019   Palpitations 07/20/2018    Need for immunization against influenza 03/12/2018   Medication refill 08/26/2017   Pain in joint of right shoulder 07/08/2017   Rotator cuff tendonitis,  left 05/05/2017   BPH (benign prostatic hyperplasia) 01/19/2017   Myalgia 12/16/2016   Insomnia 12/03/2016   Chronic, continuous use of opioids 03/18/2016   Neuropathic pain of left forearm 11/09/2015   Healthcare maintenance 11/09/2013   Erectile dysfunction 12/10/2012   PUD (peptic ulcer disease) 06/19/2010   Headache 09/13/2009   Allergic rhinitis 09/11/2009   Essential hypertension 06/01/2009   Back pain 09/15/2008   Type II diabetes mellitus with manifestations, uncontrolled (HCC) 09/16/1999   Sharol Roussel, PT, DPT, OCS, Crt. DN  Robet Leu 09/07/2020, 10:14 AM  [facility] [address] [place], [place], [address] Phone: [phone]   Fax:  [phone]  Name: Zeven Kocak MRN: [id number] Date of Birth: [birth year]

## 2020-09-07 NOTE — Patient Instructions (Addendum)
  Access Code: [email or web address] URL: [email or web address] Date: 09/07/2020 Prepared by: Sharol Roussel  Exercises Supine Posterior Pelvic Tilt - 1 x daily - 7 x weekly - 1 sets - 10 reps - 3 hold Supine Bridge with Mini Swiss Ball Between Knees - 1 x daily - 7 x weekly - 1 sets - 15 reps - 3 hold Supine Lower Trunk Rotation - 1 x daily - 7 x weekly - 1 sets - 3 reps - 20 hold Supine Bilateral Isometric Hip Flexion - 1 x daily - 7 x weekly - 1 sets - 10 reps - 10 hold Supine Piriformis Stretch with Foot on Ground - 1 x daily - 7 x weekly - 1 sets - 3 reps - 30 hold Hooklying Isometric Clamshell - 1 x daily - 7 x weekly - 1 sets - 15 reps

## 2020-09-10 ENCOUNTER — Other Ambulatory Visit: Payer: Self-pay | Admitting: Family Medicine

## 2020-09-10 DIAGNOSIS — M545 Low back pain, unspecified: Secondary | ICD-10-CM

## 2020-09-10 DIAGNOSIS — G8929 Other chronic pain: Secondary | ICD-10-CM

## 2020-09-11 ENCOUNTER — Ambulatory Visit: Payer: Medicare Other

## 2020-09-11 ENCOUNTER — Other Ambulatory Visit: Payer: Self-pay

## 2020-09-11 DIAGNOSIS — M6281 Muscle weakness (generalized): Secondary | ICD-10-CM

## 2020-09-11 DIAGNOSIS — G8929 Other chronic pain: Secondary | ICD-10-CM

## 2020-09-11 DIAGNOSIS — M545 Low back pain, unspecified: Secondary | ICD-10-CM | POA: Diagnosis not present

## 2020-09-11 NOTE — Therapy (Signed)
[place], [place], [address] Phone: [phone]   Fax:  [phone]  Physical Therapy Treatment  Patient Details  Name: Mitchell Page MRN: [id number] Date of Birth: [birth year] Referring Provider (PT): Kinnie Feil, MD   Encounter Date: 09/11/2020   PT End of Session - 09/11/20 1016     Visit Number 5    Number of Visits 17    Date for PT Re-Evaluation 10/16/20    Authorization Type [employer] MCD - 27 annual visits    Authorization - Number of Visits 27    PT Start Time 1016    PT Stop Time 1059    PT Time Calculation (min) 43 min    Activity Tolerance Patient tolerated treatment well    Behavior During Therapy [facility] for tasks assessed/performed             Past Medical History:  Diagnosis Date   Allergic rhinitis    Chronic headache    Cough 04/21/2017   Diabetes mellitus    GERD (gastroesophageal reflux disease)    Hyperlipidemia    Hypertension    Illiteracy and low-level literacy    Unclear of his literacy  in his native tongue, however, he is unable to read in English   Lower back pain    w/ R LE sciatica s/p laminectomy in 2008. MRI- 04/2008    Past Surgical History:  Procedure Laterality Date   LUMBAR LAMINECTOMY  2008   l4-5   Repair of left arm fracture      There were no vitals filed for this visit.   Subjective Assessment - 09/11/20 1019     Subjective The patient reports that he did have any tightness in the low back after last session.  He is feeling better.    Limitations Sitting;Lifting;Standing    How long can you sit comfortably? 30-40 minutes    How long can you stand comfortably? 45 minutes    Currently in Pain? Yes    Pain Score 4     Pain Location Back    Pain Orientation Right;Left;Posterior                [facility] PT Assessment - 09/11/20 0001       Assessment   Medical Diagnosis Chronic Low Back Pain    Referring Provider (PT) Kinnie Feil,  MD      Strength   Overall Strength Within functional limits for tasks performed;Other (comment)    Overall Strength Comments Gross LE Strength 5/5    Lumbar Flexion 4-/5    Lumbar Extension 4-/5                           [facility] Adult PT Treatment/Exercise - 09/11/20 0001       Lumbar Exercises: Stretches   Lower Trunk Rotation 20 seconds;3 reps    Pelvic Tilt 5 seconds;15 reps    Piriformis Stretch 30 seconds;3 reps;Left;Right      Lumbar Exercises: Aerobic   Nustep 5 min level 5      Lumbar Exercises: Standing   Shoulder Extension 15 reps;Theraband    Theraband Level (Shoulder Extension) Level 3 (Green)    Other Standing Lumbar Exercises PPT at wall 3" x 10      Lumbar Exercises: Supine   Clam 15 reps    Clam Limitations Green band, unilaterally    Bridge with [employer] 20 reps  Isometric Hip Flexion --    Large Ball Abdominal Isometric 20 reps    Large Ball Abdominal Isometric Limitations alt arm lifts      Manual Therapy   Manual Therapy Soft tissue mobilization    Joint Mobilization Prone L5-L2 unilateral PA grade II/III R and L, sacral distraction grade III    Soft tissue mobilization TrP release L piriformis                    PT Education - 09/11/20 1056     Education Details Advised pt to call referring MD to discuss plan to wean off pain medication    Person(s) Educated Patient    Methods Explanation    Comprehension Verbalized understanding              PT Short Term Goals - 09/07/20 0927       PT SHORT TERM GOAL #1   Title The patient will be independent in a basic HEP    Time 2    Period Weeks    Status Achieved    Target Date 09/04/20               PT Long Term Goals - 09/11/20 1040       PT LONG TERM GOAL #1   Title The patient will be able to sit for 1 hour and reports pain with standing < 4/10 with taking pain medicaton.    Baseline 20 minutes sitting and pain 8/10 without medication    Time 8     Period Weeks    Status Partially Met      PT LONG TERM GOAL #2   Title The patient will present with improvement of lumbar flexion and extension strength to 4/5 for lifting.    Baseline 3+ to 4- out of 5    Time 8    Period Weeks    Status Partially Met      PT LONG TERM GOAL #3   Title The patient will be able to stand for 1 hour with low back pain <5/10 without taking pain medication.    Baseline 40 minutes 8/10 without pain medicaton    Time 8    Period Weeks    Status Partially Met                   Plan - 09/11/20 1021     Clinical Impression Statement The patient is making good progress in therapy.  He is reporting a reduction of pain levels and improvement of tolerance for both prolong sitting and standing.  He is still taking his Tramadol.  Advised the patient to reach out to the referring MD for a pland to wean off the pain medication.  Progressed therapy with standing core stabilization exercises.  Reviewed his new HEP exercises.  The patient tolerated therapy well today.  Recommend continued therapy for pain control and reduction of pain medication of ADLs.    Personal Factors and Comorbidities Comorbidity 1    Comorbidities DM    Examination-Activity Limitations Carry;Transfers;Lift;Other    Examination-Participation Restrictions Driving;Cleaning;Valla Leaver Work    PT Treatment/Interventions ADLs/Self Care Home Management;Cryotherapy;Electrical Stimulation;Stair training;Functional mobility training;Traction;Therapeutic activities;Moist Heat;Therapeutic exercise;Neuromuscular re-education;Balance training;Manual techniques;Patient/family education;Passive range of motion;Dry needling;Spinal Manipulations;Joint Manipulations    PT Next Visit Plan continue joint mobs lumbar, progress core stabilization, hip strengthening, standing core stabilization    PT Home Exercise Plan Access Code: [email or web address]    Consulted and Agree with Plan of Care Patient  Patient  will benefit from skilled therapeutic intervention in order to improve the following deficits and impairments:  Pain, Hypermobility, Decreased activity tolerance, Decreased strength, Postural dysfunction  Visit Diagnosis: Chronic bilateral low back pain without sciatica  Muscle weakness (generalized)     Problem List Patient Active Problem List   Diagnosis Date Noted   Muscle weakness of left upper extremity 08/23/2020   Nonspecific chest pain 04/15/2019   Costochondral chest pain 04/15/2019   Palpitations 07/20/2018   Need for immunization against influenza 03/12/2018   Medication refill 08/26/2017   Pain in joint of right shoulder 07/08/2017   Rotator cuff tendonitis, left 05/05/2017   BPH (benign prostatic hyperplasia) 01/19/2017   Myalgia 12/16/2016   Insomnia 12/03/2016   Chronic, continuous use of opioids 03/18/2016   Neuropathic pain of left forearm 11/09/2015   Healthcare maintenance 11/09/2013   Erectile dysfunction 12/10/2012   PUD (peptic ulcer disease) 06/19/2010   Headache 09/13/2009   Allergic rhinitis 09/11/2009   Essential hypertension 06/01/2009   Back pain 09/15/2008   Type II diabetes mellitus with manifestations, uncontrolled ([place]) 09/16/1999   Rich Number, PT, DPT, OCS, Crt. DN  Bethena Midget 09/11/2020, 11:00 AM  [facility] [address] [place], [place], [address] Phone: [phone]   Fax:  [phone]  Name: Mitchell Page MRN: [id number] Date of Birth: 10-03-1955

## 2020-09-13 ENCOUNTER — Ambulatory Visit: Payer: Medicare Other

## 2020-09-13 ENCOUNTER — Other Ambulatory Visit: Payer: Self-pay

## 2020-09-13 DIAGNOSIS — G8929 Other chronic pain: Secondary | ICD-10-CM

## 2020-09-13 DIAGNOSIS — M6281 Muscle weakness (generalized): Secondary | ICD-10-CM

## 2020-09-13 DIAGNOSIS — M545 Low back pain, unspecified: Secondary | ICD-10-CM | POA: Diagnosis not present

## 2020-09-13 NOTE — Therapy (Signed)
[place], [place], [address] Phone: [phone]   Fax:  [phone]  Physical Therapy Treatment  Patient Details  Name: Mitchell Page MRN: [id number] Date of Birth: Feb 26, 1956 Referring Provider (PT): Kinnie Feil, MD   Encounter Date: 09/13/2020   PT End of Session - 09/13/20 1014     Visit Number 6    Number of Visits 17    Date for PT Re-Evaluation 10/16/20    Authorization Type [employer] MCD - 27 annual visits    PT Start Time 1016    PT Stop Time 1100    PT Time Calculation (min) 44 min    Activity Tolerance Patient tolerated treatment well    Behavior During Therapy [facility] for tasks assessed/performed             Past Medical History:  Diagnosis Date   Allergic rhinitis    Chronic headache    Cough 04/21/2017   Diabetes mellitus    GERD (gastroesophageal reflux disease)    Hyperlipidemia    Hypertension    Illiteracy and low-level literacy    Unclear of his literacy  in his native tongue, however, he is unable to read in English   Lower back pain    w/ R LE sciatica s/p laminectomy in 2008. MRI- 04/2008    Past Surgical History:  Procedure Laterality Date   LUMBAR LAMINECTOMY  2008   l4-5   Repair of left arm fracture      There were no vitals filed for this visit.   Subjective Assessment - 09/13/20 1019     Subjective Mitchell Page reports that he left a message for his MD reguarding weaning off this pain medication.  He has not heard back from him yet.    Patient Stated Goals To reduce the pain, increase tolerance for sitting and standing.                [facility] PT Assessment - 09/13/20 0001       Assessment   Medical Diagnosis Chronic Low Back Pain    Referring Provider (PT) Kinnie Feil, MD      AROM   Lumbar Flexion 100%    Lumbar Extension 80%    Lumbar - Right Side Bend To knee    Lumbar - Left Side Bend To knee    Lumbar - Right Rotation 100%    Lumbar -  Left Rotation 100%                           [facility] Adult PT Treatment/Exercise - 09/13/20 0001       Lumbar Exercises: Stretches   Lower Trunk Rotation 20 seconds;3 reps    Piriformis Stretch 30 seconds;3 reps;Left;Right      Lumbar Exercises: Aerobic   Nustep 5 min level 5      Lumbar Exercises: Standing   Shoulder Extension 15 reps;Theraband    Theraband Level (Shoulder Extension) Level 3 (Green)    Other Standing Lumbar Exercises PPT at wall 3" x 10    Other Standing Lumbar Exercises Tband Press outs GTB 5" x 10      Lumbar Exercises: Supine   Clam 20 reps    Clam Limitations Green band, unilaterally    Bridge with [employer] 20 reps    Basic Lumbar Stabilization 10 reps    Basic Lumbar Stabilization Limitations LTR w/ LE on SB    Large [employer]  Abdominal Isometric 20 reps    Large Ball Abdominal Isometric Limitations alt arm lifts      Manual Therapy   Manual Therapy Soft tissue mobilization    Joint Mobilization Prone L5-L2 unilateral PA grade II/III R and L, sacral distraction grade III    Soft tissue mobilization --                      PT Short Term Goals - 09/07/20 0927       PT SHORT TERM GOAL #1   Title The patient will be independent in a basic HEP    Time 2    Period Weeks    Status Achieved    Target Date 09/04/20               PT Long Term Goals - 09/11/20 1040       PT LONG TERM GOAL #1   Title The patient will be able to sit for 1 hour and reports pain with standing < 4/10 with taking pain medicaton.    Baseline 20 minutes sitting and pain 8/10 without medication    Time 8    Period Weeks    Status Partially Met      PT LONG TERM GOAL #2   Title The patient will present with improvement of lumbar flexion and extension strength to 4/5 for lifting.    Baseline 3+ to 4- out of 5    Time 8    Period Weeks    Status Partially Met      PT LONG TERM GOAL #3   Title The patient will be able to stand for 1 hour  with low back pain <5/10 without taking pain medication.    Baseline 40 minutes 8/10 without pain medicaton    Time 8    Period Weeks    Status Partially Met                   Plan - 09/13/20 1018     Clinical Impression Statement Mitchell Page reports that his pain is still improving.  He left a message with the referring MD to discuss weaning off his pain medication.  The patient reports that he had not heard back yet.  Progressed core stabilization in standing with press outs and supine with LTR SB.  The patient tolerated both of these well.  He reported being fatgiued in therapy.  He not longer has palpable tenderness to the piriformis muscle.  Recommend continued therapy for incresae of standing and sitting tolerance, and reduction of medication usage.    Personal Factors and Comorbidities Comorbidity 1    Comorbidities DM    Examination-Activity Limitations Carry;Transfers;Lift;Other    Examination-Participation Restrictions Driving;Cleaning;Valla Leaver Work    PT Treatment/Interventions ADLs/Self Care Home Management;Cryotherapy;Electrical Stimulation;Stair training;Functional mobility training;Traction;Therapeutic activities;Moist Heat;Therapeutic exercise;Neuromuscular re-education;Balance training;Manual techniques;Patient/family education;Passive range of motion;Dry needling;Spinal Manipulations;Joint Manipulations    PT Next Visit Plan continue joint mobs lumbar, progress core stabilization, hip strengthening, standing core stabilization, squatting    PT Home Exercise Plan Access Code: [email or web address]    Consulted and Agree with Plan of Care Patient             Patient will benefit from skilled therapeutic intervention in order to improve the following deficits and impairments:  Pain, Hypermobility, Decreased activity tolerance, Decreased strength, Postural dysfunction  Visit Diagnosis: Chronic bilateral low back pain without sciatica  Muscle weakness (generalized)     Problem  List Patient Active Problem  List   Diagnosis Date Noted   Muscle weakness of left upper extremity 08/23/2020   Nonspecific chest pain 04/15/2019   Costochondral chest pain 04/15/2019   Palpitations 07/20/2018   Need for immunization against influenza 03/12/2018   Medication refill 08/26/2017   Pain in joint of right shoulder 07/08/2017   Rotator cuff tendonitis, left 05/05/2017   BPH (benign prostatic hyperplasia) 01/19/2017   Myalgia 12/16/2016   Insomnia 12/03/2016   Chronic, continuous use of opioids 03/18/2016   Neuropathic pain of left forearm 11/09/2015   Healthcare maintenance 11/09/2013   Erectile dysfunction 12/10/2012   PUD (peptic ulcer disease) 06/19/2010   Headache 09/13/2009   Allergic rhinitis 09/11/2009   Essential hypertension 06/01/2009   Back pain 09/15/2008   Type II diabetes mellitus with manifestations, uncontrolled ([place]) 09/16/1999   Rich Number, PT, DPT, OCS, Crt. DN  Bethena Midget 09/13/2020, 10:59 AM  [facility] [address] [place], [place], [address] Phone: [phone]   Fax:  [phone]  Name: Mitchell Page MRN: [id number] Date of Birth: 01/27/1956

## 2020-09-18 ENCOUNTER — Other Ambulatory Visit: Payer: Self-pay

## 2020-09-18 ENCOUNTER — Ambulatory Visit: Payer: Medicare Other | Attending: Family Medicine

## 2020-09-18 DIAGNOSIS — M6281 Muscle weakness (generalized): Secondary | ICD-10-CM | POA: Insufficient documentation

## 2020-09-18 DIAGNOSIS — M545 Low back pain, unspecified: Secondary | ICD-10-CM

## 2020-09-18 DIAGNOSIS — G8929 Other chronic pain: Secondary | ICD-10-CM | POA: Diagnosis present

## 2020-09-18 NOTE — Therapy (Signed)
[place], [place], [address] Phone: [phone]   Fax:  [phone]  Physical Therapy Treatment  Patient Details  Name: Mitchell Page MRN: [id number] Date of Birth: Oct 23, 1955 Referring Provider (PT): Kinnie Feil, MD   Encounter Date: 09/18/2020   PT End of Session - 09/18/20 0936     Visit Number 7    Number of Visits 17    Date for PT Re-Evaluation 10/16/20    Authorization Type [employer] MCD - 27 annual visits    PT Start Time 0935    PT Stop Time 1015    PT Time Calculation (min) 40 min    Activity Tolerance Patient tolerated treatment well    Behavior During Therapy [facility] for tasks assessed/performed             Past Medical History:  Diagnosis Date   Allergic rhinitis    Chronic headache    Cough 04/21/2017   Diabetes mellitus    GERD (gastroesophageal reflux disease)    Hyperlipidemia    Hypertension    Illiteracy and low-level literacy    Unclear of his literacy  in his native tongue, however, he is unable to read in English   Lower back pain    w/ R LE sciatica s/p laminectomy in 2008. MRI- 04/2008    Past Surgical History:  Procedure Laterality Date   LUMBAR LAMINECTOMY  2008   l4-5   Repair of left arm fracture      There were no vitals filed for this visit.   Subjective Assessment - 09/18/20 0936     Subjective The patient reports that he did speak with the MD regarding his pain medication.  He said to take the medication as needed.  Mitchell Page reports that he did not take the pain medication this morning.    How long can you stand comfortably? 45 minutes    How long can you walk comfortably? 1 hour    Patient Stated Goals To reduce the pain, increase tolerance for sitting and standing.    Currently in Pain? No/denies                [facility] PT Assessment - 09/18/20 0001       Assessment   Medical Diagnosis Chronic Low Back Pain    Referring Provider (PT) Kinnie Feil, MD                           [facility] Adult PT Treatment/Exercise - 09/18/20 0001       Lumbar Exercises: Standing   Shoulder Extension Theraband;20 reps    Theraband Level (Shoulder Extension) Level 3 (Green)    Shoulder Extension Limitations with alternate march    Other Standing Lumbar Exercises PPT at wall 3" x 10    Other Standing Lumbar Exercises Tband Press outs GTB 5" x 10      Lumbar Exercises: Supine   Clam 20 reps    Clam Limitations Green band, unilaterally    Bridge 20 reps    Bridge Limitations LE on SB    Basic Lumbar Stabilization 10 reps    Basic Lumbar Stabilization Limitations LTR w/ LE on SB    Large Ball Abdominal Isometric 20 reps    Large Ball Abdominal Isometric Limitations alt arm lifts      Manual Therapy   Joint Mobilization Prone L5-L2 unilateral PA grade II/III R and L, sacral distraction grade  III                      PT Short Term Goals - 09/18/20 0937       PT SHORT TERM GOAL #1   Title The patient will be independent in a basic HEP    Time 2    Period Weeks    Status Achieved    Target Date 09/04/20               PT Long Term Goals - 09/18/20 0938       PT LONG TERM GOAL #1   Title The patient will be able to sit for 1 hour and reports pain with standing < 4/10 without taking pain medicaton.    Baseline 20 minutes sitting and pain 8/10 without medication    Time 8    Period Weeks    Status Partially Met    Target Date 10/16/20      PT LONG TERM GOAL #2   Title The patient will present with improvement of lumbar flexion and extension strength to 4/5 for lifting.    Baseline 3+ to 4- out of 5    Time 8    Period Weeks    Status Partially Met    Target Date 10/16/20      PT LONG TERM GOAL #3   Title The patient will be able to stand for 1 hour with low back pain <5/10 without taking pain medication.    Baseline 40 minutes 8/10 without pain medicaton    Time 8    Period Weeks     Status Partially Met    Target Date 10/16/20                   Plan - 09/18/20 0946     Clinical Impression Statement The patient arrived to therapy reporting 0/10 pain without taking his pain medication.  He did speak with his MD reguarding the Tramadol and the MD said to take as needed.  Continued to progress core stabilization exercises.  Performed bridges on the SB and added marches to standing Tband extensions.  The patient is having increase control with standing posterior pelvic tilts.  The patient was a bit unsteady with alternate marches today.  Recommend continued therapy for improvement of standing, walking, and sitting tolerance without his pain medication.    Personal Factors and Comorbidities Comorbidity 1    Comorbidities DM    Examination-Activity Limitations Carry;Transfers;Lift;Other    PT Treatment/Interventions ADLs/Self Care Home Management;Cryotherapy;Electrical Stimulation;Stair training;Functional mobility training;Traction;Therapeutic activities;Moist Heat;Therapeutic exercise;Neuromuscular re-education;Balance training;Manual techniques;Patient/family education;Passive range of motion;Dry needling;Spinal Manipulations;Joint Manipulations    PT Next Visit Plan progress HEP, continue joint mobs lumbar, progress core stabilization, hip strengthening, standing core stabilization, squatting    PT Home Exercise Plan Access Code: [email or web address]    Consulted and Agree with Plan of Care Patient             Patient will benefit from skilled therapeutic intervention in order to improve the following deficits and impairments:  Pain, Hypermobility, Decreased activity tolerance, Decreased strength, Postural dysfunction  Visit Diagnosis: Chronic bilateral low back pain without sciatica  Muscle weakness (generalized)     Problem List Patient Active Problem List   Diagnosis Date Noted   Muscle weakness of left upper extremity 08/23/2020   Nonspecific chest pain  04/15/2019   Costochondral chest pain 04/15/2019   Palpitations 07/20/2018   Need for immunization against influenza 03/12/2018  Medication refill 08/26/2017   Pain in joint of right shoulder 07/08/2017   Rotator cuff tendonitis, left 05/05/2017   BPH (benign prostatic hyperplasia) 01/19/2017   Myalgia 12/16/2016   Insomnia 12/03/2016   Chronic, continuous use of opioids 03/18/2016   Neuropathic pain of left forearm 11/09/2015   Healthcare maintenance 11/09/2013   Erectile dysfunction 12/10/2012   PUD (peptic ulcer disease) 06/19/2010   Headache 09/13/2009   Allergic rhinitis 09/11/2009   Essential hypertension 06/01/2009   Back pain 09/15/2008   Type II diabetes mellitus with manifestations, uncontrolled ([place]) 09/16/1999   Rich Number, PT, DPT, OCS, Crt. DN  Bethena Midget 09/18/2020, 11:01 AM  [facility] [address] [place], [place], [address] Phone: [phone]   Fax:  [phone]  Name: Mitchell Page MRN: [id number] Date of Birth: Jan 21, 1956

## 2020-09-19 ENCOUNTER — Ambulatory Visit (INDEPENDENT_AMBULATORY_CARE_PROVIDER_SITE_OTHER): Payer: Medicare Other | Admitting: Pharmacist

## 2020-09-19 ENCOUNTER — Other Ambulatory Visit: Payer: Self-pay

## 2020-09-19 DIAGNOSIS — E118 Type 2 diabetes mellitus with unspecified complications: Secondary | ICD-10-CM | POA: Diagnosis not present

## 2020-09-19 DIAGNOSIS — IMO0002 Reserved for concepts with insufficient information to code with codable children: Secondary | ICD-10-CM

## 2020-09-19 DIAGNOSIS — E1165 Type 2 diabetes mellitus with hyperglycemia: Secondary | ICD-10-CM | POA: Diagnosis not present

## 2020-09-19 NOTE — Progress Notes (Signed)
Subjective:    Patient ID: Mitchell Page, male    DOB: 1955-10-07, 65 y.o.   MRN: [id number]  HPI Patient is a 65 y.o. male who presents for diabetes management. He is in good spirits and presents without assistance. Patient was referred on 05/14/20 and last seen by prior PCP on 08/23/20. Last seen I pharmacy clinic on 08/01/20.   Patient states he is feeling great and knows his blood glucose is better by the way he feels every day. He reports that he has only been checking his blood glucose on occasion when he doesn't feel well. Patient did bring up issue regarding erectile dysfunction and was requesting a prescription. Discussed with patient that I am unable to send in prescription for this but I will send message to his new PCP.   Insurance coverage/medication affordability: Medicare  Current diabetes medications include: metformin 500mg  2 tabs BID, empagliflozin (Jardiance) 10mg , insulin glargine (Lantus) 20 units once daily Current hypertension medications include: amlodipine 10mg , losartan 50mg  Current hyperlipidemia medications include: atorvastatin 40mg  Patient states that He is taking his medications as prescribed. Patient reports adherence with medications. Patient states that He does not miss doses of his medications.  Does you feel that your medications are working for you?  yes  Have you been experiencing any side effects to the medications prescribed? no  Do you have any problems obtaining medications due to transportation or finances?  no    Patient reported dietary habits:  Eats 3 meals/day; Does not use bolus insulin Breakfast: eggs, potato Lunch: "depends on what I am feeling" Dinner: typically fufu Drinks: water   Patient denies hypoglycemic events. Patient denies polyuria (increased urination).  Patient denies polyphagia (increased appetite).  Patient denies polydipsia (increased thirst).  Patient denies neuropathy (nerve pain). Patient denies visual  changes. Patient reports self foot exams.   Random blood glucose over past 30 days: 120 (09/10/20), 160(08/30/20), 326(08/23/20) ,194 (08/21/20)  Objective:   Labs:   Physical Exam Musculoskeletal:        General: No swelling.     Right lower leg: No edema.     Left lower leg: No edema.  Neurological:     Mental Status: He is alert and oriented to person, place, and time.  Psychiatric:        Mood and Affect: Mood normal.   Review of Systems  Cardiovascular:  Negative for leg swelling.   Lab Results  Component Value Date   HGBA1C 9.1 (A) 08/08/2020   HGBA1C 12.0 (A) 05/14/2020   HGBA1C 9.9 (A) 02/01/2020    Vitals:   09/19/20 1435  BP: 118/68    Lab Results  Component Value Date   MICRALBCREAT 5.7 06/01/2009    Lipid Panel     Component Value Date/Time   CHOL 155 02/01/2020 1335   TRIG 82 02/01/2020 1335   HDL 51 02/01/2020 1335   CHOLHDL 3.0 02/01/2020 1335   CHOLHDL 2.7 07/07/2014 1621   VLDL 10 07/07/2014 1621   LDLCALC 88 02/01/2020 1335    Clinical Atherosclerotic Cardiovascular Disease (ASCVD): No  The 10-year ASCVD risk score 02/03/2020 DC Jr., et al., 2013) is: 23.1%   Values used to calculate the score:     Age: 63 years     Sex: Male     Is Non-Hispanic African American: Yes     Diabetic: Yes     Tobacco smoker: No     Systolic Blood Pressure: 118 mmHg     Is BP treated: Yes  HDL Cholesterol: 51 mg/dL     Total Cholesterol: 155 mg/dL   Assessment/Plan:   C1UL control is difficult to assess due to patient infrequently checking blood glucose, but readings report improvement from previous visit and A1C from May down 3% from February. Medication adherence appears optimal. Will have patient check blood glucose daily to better assess therapy. Following instruction patient verbalized understanding of treatment plan.    Continued basal insulin glargine (Lantus) 20 units once daily.  Continued SGLT2-I empagliflozin (Jardiance) 10 mg. Extensively  discussed pathophysiology of diabetes, dietary effects on blood sugar control, and recommended lifestyle interventions. Counseled on s/sx of and management of hypoglycemia Next A1C August 2022.   ASCVD risk - primary prevention in patient with diabetes. Last LDL is controlled. ASCVD risk score is >20%  - high intensity statin indicated. Aspirin is not indicated.   Continued atorvastatin 40 mg.   Hypertension longstanding currently controlled.  Blood pressure goal <130/80 mmHg. Medication adherence appears optimal.    Continue current antihypertensive therapy.  Erectile dysfunction - Routed message to PCP for further management.   Follow-up appointment 4 weeks to review sugar readings. Written patient instructions provided.  This appointment required 30 minutes of patient care (this includes precharting, chart review, review of results, and face-to-face care).  Thank you for involving pharmacy to assist in providing this patient's care.

## 2020-09-19 NOTE — Assessment & Plan Note (Signed)
T2DM control is difficult to assess due to patient infrequently checking blood glucose, but readings report improvement from previous visit and A1C from May down 3% from February. Medication adherence appears optimal. Will have patient check blood glucose daily to better assess therapy. Following instruction patient verbalized understanding of treatment plan.    1. Continued basal insulin glargine (Lantus) 20 units once daily.  2. Continued SGLT2-I empagliflozin (Jardiance) 10 mg. 3. Extensively discussed pathophysiology of diabetes, dietary effects on blood sugar control, and recommended lifestyle interventions. 4. Counseled on s/sx of and management of hypoglycemia 5. Next A1C August 2022.

## 2020-09-19 NOTE — Patient Instructions (Addendum)
Mitchell Page it was a pleasure seeing you today.   Please do the following:  Continue your medications as directed today during your appointment. If you have any questions or if you believe something has occurred because of this change, call me or your doctor to let one of [place] know.  Continue checking blood sugars at home. It's really important that you record these and bring these in to your next doctor's appointment. Please check your blood sugars every morning and th Continue making the lifestyle changes we've discussed together during our visit. Diet and exercise play a significant role in improving your blood sugars.  Follow-up with me in 4 weeks.   Hypoglycemia or low blood sugar:   Low blood sugar can happen quickly and may become an emergency if not treated right away.   While this shouldn't happen often, it can be brought upon if you skip a meal or do not eat enough. Also, if your insulin or other diabetes medications are dosed too high, this can cause your blood sugar to go to low.   Warning signs of low blood sugar include: Feeling shaky or dizzy Feeling weak or tired  Excessive hunger Feeling anxious or upset  Sweating even when you aren't exercising  What to do if I experience low blood sugar? Follow the Rule of 15 Check your blood sugar with your meter. If lower than 70, proceed to step 2.  Treat with 15 grams of fast acting carbs which is found in 3-4 glucose tablets. If none are available you can try hard candy, 1 tablespoon of sugar or honey,4 ounces of fruit juice, or 6 ounces of REGULAR soda.  Re-check your sugar in 15 minutes. If it is still below 70, do what you did in step 2 again. If your blood sugar has come back up, go ahead and eat a snack or small meal made up of complex carbs (ex. Whole grains) and protein at this time to avoid recurrence of low blood sugar.

## 2020-09-20 ENCOUNTER — Ambulatory Visit: Payer: Medicare Other

## 2020-09-20 DIAGNOSIS — G8929 Other chronic pain: Secondary | ICD-10-CM

## 2020-09-20 DIAGNOSIS — M545 Low back pain, unspecified: Secondary | ICD-10-CM | POA: Diagnosis not present

## 2020-09-20 DIAGNOSIS — M6281 Muscle weakness (generalized): Secondary | ICD-10-CM

## 2020-09-20 NOTE — Therapy (Signed)
[place], [place], [address] Phone: [phone]   Fax:  [phone]  Physical Therapy Treatment  Patient Details  Name: Mitchell Page MRN: [id number] Date of Birth: February 21, 1956 Referring Provider (PT): Kinnie Feil, MD   Encounter Date: 09/20/2020   PT End of Session - 09/20/20 0944     Visit Number 8    Number of Visits 17    Date for PT Re-Evaluation 10/16/20    Authorization Type [employer] MCD - 27 annual visits    PT Start Time 0930    PT Stop Time 1012    PT Time Calculation (min) 42 min    Activity Tolerance Patient tolerated treatment well    Behavior During Therapy [facility] for tasks assessed/performed             Past Medical History:  Diagnosis Date   Allergic rhinitis    Chronic headache    Cough 04/21/2017   Diabetes mellitus    GERD (gastroesophageal reflux disease)    Hyperlipidemia    Hypertension    Illiteracy and low-level literacy    Unclear of his literacy  in his native tongue, however, he is unable to read in English   Lower back pain    w/ R LE sciatica s/p laminectomy in 2008. MRI- 04/2008    Past Surgical History:  Procedure Laterality Date   LUMBAR LAMINECTOMY  2008   l4-5   Repair of left arm fracture      There were no vitals filed for this visit.   Subjective Assessment - 09/20/20 0945     Subjective The patient reports that he did not take his pain medication again today.                [facility] PT Assessment - 09/20/20 0001       Assessment   Medical Diagnosis Chronic Low Back Pain    Referring Provider (PT) Kinnie Feil, MD                           [facility] Adult PT Treatment/Exercise - 09/20/20 0001       Lumbar Exercises: Stretches   Lower Trunk Rotation 20 seconds;3 reps      Lumbar Exercises: Standing   Shoulder Extension Theraband;20 reps    Theraband Level (Shoulder Extension) Level 3 (Green)    Other Standing  Lumbar Exercises PPT at wall 3" x 10    Other Standing Lumbar Exercises Tband Press outs GTB 5" x 10      Lumbar Exercises: Supine   Clam 20 reps    Clam Limitations Blue band, unilaterally    Bridge 20 reps    Bridge Limitations LE on SB    Basic Lumbar Stabilization 10 reps    Basic Lumbar Stabilization Limitations LTR w/ LE on SB    Large Ball Abdominal Isometric 20 reps    Large Ball Abdominal Isometric Limitations alt arm/LE lifts      Manual Therapy   Joint Mobilization Prone L5-L2 unilateral PA grade II/III R and L, sacral distraction grade III                    PT Education - 09/20/20 1006     Education Details updated HEP    Person(s) Educated Patient    Methods Handout    Comprehension Verbalized understanding  PT Short Term Goals - 09/18/20 0937       PT SHORT TERM GOAL #1   Title The patient will be independent in a basic HEP    Time 2    Period Weeks    Status Achieved    Target Date 09/04/20               PT Long Term Goals - 09/18/20 0938       PT LONG TERM GOAL #1   Title The patient will be able to sit for 1 hour and reports pain with standing < 4/10 without taking pain medicaton.    Baseline 20 minutes sitting and pain 8/10 without medication    Time 8    Period Weeks    Status Partially Met    Target Date 10/16/20      PT LONG TERM GOAL #2   Title The patient will present with improvement of lumbar flexion and extension strength to 4/5 for lifting.    Baseline 3+ to 4- out of 5    Time 8    Period Weeks    Status Partially Met    Target Date 10/16/20      PT LONG TERM GOAL #3   Title The patient will be able to stand for 1 hour with low back pain <5/10 without taking pain medication.    Baseline 40 minutes 8/10 without pain medicaton    Time 8    Period Weeks    Status Partially Met    Target Date 10/16/20                   Plan - 09/20/20 0959     Clinical Impression Statement Cruz  did not take his pain medication again today.  We progressed resistance with standing press outs to two green bands and with supine clam shells to blue band.  The patient was able to perform dying bug with the SB today.  Added standing press outs to the patient's HEP.  The patient is making good progress in therapy.  Recommend body mechanic training and strengthening for squatting and lifting activities.    Personal Factors and Comorbidities Comorbidity 1    Comorbidities DM    Examination-Activity Limitations Carry;Transfers;Lift;Other    Examination-Participation Restrictions Driving;Cleaning;Valla Leaver Work    PT Treatment/Interventions ADLs/Self Care Home Management;Cryotherapy;Electrical Stimulation;Stair training;Functional mobility training;Traction;Therapeutic activities;Moist Heat;Therapeutic exercise;Neuromuscular re-education;Balance training;Manual techniques;Patient/family education;Passive range of motion;Dry needling;Spinal Manipulations;Joint Manipulations    PT Next Visit Plan squat/lifting, continue joint mobs lumbar, progress core stabilization, hip strengthening, standing core stabilization, squatting    PT Home Exercise Plan Access Code: [email or web address]    Consulted and Agree with Plan of Care Patient             Patient will benefit from skilled therapeutic intervention in order to improve the following deficits and impairments:  Pain, Hypermobility, Decreased activity tolerance, Decreased strength, Postural dysfunction  Visit Diagnosis: Chronic bilateral low back pain without sciatica  Muscle weakness (generalized)     Problem List Patient Active Problem List   Diagnosis Date Noted   Muscle weakness of left upper extremity 08/23/2020   Nonspecific chest pain 04/15/2019   Costochondral chest pain 04/15/2019   Palpitations 07/20/2018   Need for immunization against influenza 03/12/2018   Medication refill 08/26/2017   Pain in joint of right shoulder 07/08/2017   Rotator  cuff tendonitis, left 05/05/2017   BPH (benign prostatic hyperplasia) 01/19/2017   Myalgia 12/16/2016   Insomnia 12/03/2016  Chronic, continuous use of opioids 03/18/2016   Neuropathic pain of left forearm 11/09/2015   Healthcare maintenance 11/09/2013   Erectile dysfunction 12/10/2012   PUD (peptic ulcer disease) 06/19/2010   Headache 09/13/2009   Allergic rhinitis 09/11/2009   Essential hypertension 06/01/2009   Back pain 09/15/2008   Type II diabetes mellitus with manifestations, uncontrolled ([place]) 09/16/1999   Rich Number, PT, DPT, OCS, Crt. DN  Bethena Midget 09/20/2020, 10:13 AM  [facility] [address] [place], [place], [address] Phone: [phone]   Fax:  [phone]  Name: Lamarion Mcevers MRN: [id number] Date of Birth: 06-16-1955

## 2020-09-25 ENCOUNTER — Ambulatory Visit: Payer: Medicare Other

## 2020-09-27 ENCOUNTER — Telehealth: Payer: Self-pay

## 2020-09-27 ENCOUNTER — Ambulatory Visit: Payer: Medicare Other

## 2020-09-27 NOTE — Telephone Encounter (Signed)
Audrick did not show for his physical therapy appointment today.  I called him and he reports that he is still not feeling well.  Reminded him of his next appointment on 10/02/20 and he reports that he will call if he is still not feeling well.  Sharol Roussel, PT, DPT, OCS, Crt. DN

## 2020-10-02 ENCOUNTER — Other Ambulatory Visit: Payer: Self-pay

## 2020-10-02 ENCOUNTER — Ambulatory Visit: Payer: Medicare Other

## 2020-10-02 DIAGNOSIS — M545 Low back pain, unspecified: Secondary | ICD-10-CM

## 2020-10-02 DIAGNOSIS — G8929 Other chronic pain: Secondary | ICD-10-CM

## 2020-10-02 DIAGNOSIS — M6281 Muscle weakness (generalized): Secondary | ICD-10-CM

## 2020-10-02 NOTE — Therapy (Signed)
[place], [place], [address] Phone: [phone]   Fax:  [phone]  Physical Therapy Treatment  Patient Details  Name: Cainen Burnham MRN: [id number] Date of Birth: 01-16-1956 Referring Provider (PT): Kinnie Feil, MD   Encounter Date: 10/02/2020   PT End of Session - 10/02/20 1014     Visit Number 9    Number of Visits 17    Date for PT Re-Evaluation 10/16/20    Authorization Type [employer] MCD - 27 annual visits    PT Start Time 2583    PT Stop Time 1058    PT Time Calculation (min) 44 min    Activity Tolerance Patient tolerated treatment well;Patient limited by fatigue    Behavior During Therapy [facility] for tasks assessed/performed             Past Medical History:  Diagnosis Date   Allergic rhinitis    Chronic headache    Cough 04/21/2017   Diabetes mellitus    GERD (gastroesophageal reflux disease)    Hyperlipidemia    Hypertension    Illiteracy and low-level literacy    Unclear of his literacy  in his native tongue, however, he is unable to read in English   Lower back pain    w/ R LE sciatica s/p laminectomy in 2008. MRI- 04/2008    Past Surgical History:  Procedure Laterality Date   LUMBAR LAMINECTOMY  2008   l4-5   Repair of left arm fracture      There were no vitals filed for this visit.   Subjective Assessment - 10/02/20 1017     Subjective The patient reports that he was sick last week.  He is still a bit tired.  His back has been doing pretty good.                [facility] PT Assessment - 10/02/20 0001       Assessment   Medical Diagnosis Chronic Low Back Pain    Referring Provider (PT) Kinnie Feil, MD      Posture/Postural Control   Posture/Postural Control Postural limitations    Postural Limitations Decreased lumbar lordosis      Palpation   Palpation comment Trigger point tenderness R QL                           [facility] Adult PT  Treatment/Exercise - 10/02/20 0001       Lumbar Exercises: Stretches   Lower Trunk Rotation 20 seconds;3 reps    Piriformis Stretch 30 seconds;3 reps;Left;Right    Other Lumbar Stretch Exercise sitting FF SB 10" x 5      Lumbar Exercises: Aerobic   Nustep 5' L5      Lumbar Exercises: Supine   Bridge 15 reps    Bridge Limitations LE on SB    Basic Lumbar Stabilization 10 reps    Basic Lumbar Stabilization Limitations LTR w/ LE on SB    Large Ball Abdominal Isometric 10 reps    Large Ball Abdominal Isometric Limitations hooklying isometrics      Lumbar Exercises: Sidelying   Clam 20 reps;Left;Right    Clam Limitations GTB      Manual Therapy   Joint Mobilization Prone L5-L2 unilateral PA grade II/III R and L, sacral distraction grade III    Soft tissue mobilization trigger point release R QL  PT Short Term Goals - 09/18/20 0937       PT SHORT TERM GOAL #1   Title The patient will be independent in a basic HEP    Time 2    Period Weeks    Status Achieved    Target Date 09/04/20               PT Long Term Goals - 09/18/20 0938       PT LONG TERM GOAL #1   Title The patient will be able to sit for 1 hour and reports pain with standing < 4/10 without taking pain medicaton.    Baseline 20 minutes sitting and pain 8/10 without medication    Time 8    Period Weeks    Status Partially Met    Target Date 10/16/20      PT LONG TERM GOAL #2   Title The patient will present with improvement of lumbar flexion and extension strength to 4/5 for lifting.    Baseline 3+ to 4- out of 5    Time 8    Period Weeks    Status Partially Met    Target Date 10/16/20      PT LONG TERM GOAL #3   Title The patient will be able to stand for 1 hour with low back pain <5/10 without taking pain medication.    Baseline 40 minutes 8/10 without pain medicaton    Time 8    Period Weeks    Status Partially Met    Target Date 10/16/20                    Plan - 10/02/20 1031     Clinical Impression Statement The patient did have less engery today in therapy due to being sick last week.  Therapy intensity was reduced today.  He did have increaes of muscle guarding in the lumbar spine, likely due to resting in the bed while sick.  The patient would benefit from continued therapy for further core/hip strengthening for lifting, squatting, and prolong standing/walking.  Recommend that the patient add one more session due to missing last due to illness.  We will re-assess after that for possible DC to HEP.    Personal Factors and Comorbidities Comorbidity 1    Comorbidities DM    Examination-Activity Limitations Carry;Transfers;Lift;Other    Examination-Participation Restrictions Driving;Cleaning;Valla Leaver Work    PT Treatment/Interventions ADLs/Self Care Home Management;Cryotherapy;Electrical Stimulation;Stair training;Functional mobility training;Traction;Therapeutic activities;Moist Heat;Therapeutic exercise;Neuromuscular re-education;Balance training;Manual techniques;Patient/family education;Passive range of motion;Dry needling;Spinal Manipulations;Joint Manipulations    PT Next Visit Plan squat/lifting, continue joint mobs lumbar, progress core stabilization, hip strengthening, standing core stabilization, 2-3 more sessions    PT Home Exercise Plan Access Code: [email or web address]    Consulted and Agree with Plan of Care Patient             Patient will benefit from skilled therapeutic intervention in order to improve the following deficits and impairments:  Pain, Hypermobility, Decreased activity tolerance, Decreased strength, Postural dysfunction  Visit Diagnosis: Chronic bilateral low back pain without sciatica  Muscle weakness (generalized)     Problem List Patient Active Problem List   Diagnosis Date Noted   Muscle weakness of left upper extremity 08/23/2020   Nonspecific chest pain 04/15/2019   Costochondral chest pain  04/15/2019   Palpitations 07/20/2018   Need for immunization against influenza 03/12/2018   Medication refill 08/26/2017   Pain in joint of right shoulder 07/08/2017   Rotator cuff tendonitis,  left 05/05/2017   BPH (benign prostatic hyperplasia) 01/19/2017   Myalgia 12/16/2016   Insomnia 12/03/2016   Chronic, continuous use of opioids 03/18/2016   Neuropathic pain of left forearm 11/09/2015   Healthcare maintenance 11/09/2013   Erectile dysfunction 12/10/2012   PUD (peptic ulcer disease) 06/19/2010   Headache 09/13/2009   Allergic rhinitis 09/11/2009   Essential hypertension 06/01/2009   Back pain 09/15/2008   Type II diabetes mellitus with manifestations, uncontrolled ([place]) 09/16/1999    Bethena Midget 10/02/2020, 11:02 AM  [facility] [address] [place], [place], [address] Phone: [phone]   Fax:  [phone]  Name: Khiem Gargis MRN: [id number] Date of Birth: 1955/12/19

## 2020-10-04 ENCOUNTER — Other Ambulatory Visit: Payer: Self-pay

## 2020-10-04 ENCOUNTER — Ambulatory Visit: Payer: Medicare Other

## 2020-10-04 DIAGNOSIS — G8929 Other chronic pain: Secondary | ICD-10-CM

## 2020-10-04 DIAGNOSIS — M6281 Muscle weakness (generalized): Secondary | ICD-10-CM

## 2020-10-04 DIAGNOSIS — M545 Low back pain, unspecified: Secondary | ICD-10-CM | POA: Diagnosis not present

## 2020-10-04 NOTE — Therapy (Signed)
[place], [place], [address] Phone: [phone]   Fax:  [phone]  Physical Therapy Treatment / Progress Note  Patient Details  Name: Mitchell Page MRN: [id number] Date of Birth: Mar 01, 1956 Referring Provider (PT): Kinnie Feil, MD  Progress Note Reporting Period 08/27/20 to 10/04/2020   See note below for Objective Data and Assessment of Progress/Goals.      Encounter Date: 10/04/2020   PT End of Session - 10/04/20 0929     Visit Number 10    Number of Visits 17    Date for PT Re-Evaluation 10/16/20    Authorization Type [employer] MCD - 27 annual visits    PT Start Time 0929             Past Medical History:  Diagnosis Date   Allergic rhinitis    Chronic headache    Cough 04/21/2017   Diabetes mellitus    GERD (gastroesophageal reflux disease)    Hyperlipidemia    Hypertension    Illiteracy and low-level literacy    Unclear of his literacy  in his native tongue, however, he is unable to read in English   Lower back pain    w/ R LE sciatica s/p laminectomy in 2008. MRI- 04/2008    Past Surgical History:  Procedure Laterality Date   LUMBAR LAMINECTOMY  2008   l4-5   Repair of left arm fracture      There were no vitals filed for this visit.   Subjective Assessment - 10/04/20 1110     Subjective The patient reports that he is feeling less fatigue today.  He conitnues to not take his pain medication.    How long can you stand comfortably? 1 hour with pain 4/10 or less    Currently in Pain? No/denies                [facility] PT Assessment - 10/04/20 0001       Assessment   Medical Diagnosis Chronic Low Back Pain    Referring Provider (PT) Kinnie Feil, MD      Observation/Other Assessments   Focus on Therapeutic Outcomes (FOTO)  59% - Lumbar      Squat   Comments 1/2 depth cuing for body mechanics      AROM   Lumbar Flexion 100%    Lumbar Extension 80%     Lumbar - Right Side Bend To knee    Lumbar - Left Side Bend To knee    Lumbar - Right Rotation 100%    Lumbar - Left Rotation 100%      Strength   Overall Strength Within functional limits for tasks performed;Other (comment)    Overall Strength Comments Gross LE Strength 5/5    Lumbar Flexion 4/5    Lumbar Extension 4/5                           [facility] Adult PT Treatment/Exercise - 10/04/20 0001       Lumbar Exercises: Aerobic   Nustep L6 x 6 minutes      Lumbar Exercises: Standing   Functional Squats 20 reps    Functional Squats Limitations 8 pound lift    Shoulder Extension Theraband;20 reps    Theraband Level (Shoulder Extension) Level 4 (Blue)    Shoulder Extension Limitations with alternate march    Other Standing Lumbar Exercises PPT at wall 3" x 10    Other  Standing Lumbar Exercises Tband Press outs Black TB 5" x 10      Lumbar Exercises: Supine   Bridge 15 reps    Bridge Limitations LE on SB    Large Ball Abdominal Isometric 20 reps    Large Ball Abdominal Isometric Limitations alt arm/LE lifts      Lumbar Exercises: Sidelying   Clam 20 reps;Left;Right    Clam Limitations GTB                      PT Short Term Goals - 10/04/20 1109       PT SHORT TERM GOAL #1   Title The patient will be independent in a basic HEP    Time 2    Period Weeks    Status Achieved    Target Date 09/04/20               PT Long Term Goals - 10/04/20 1108       PT LONG TERM GOAL #1   Title The patient will be able to sit for 1 hour and reports pain with standing < 4/10 without taking pain medicaton.    Baseline 20 minutes sitting and pain 8/10 without medication    Time 8    Period Weeks    Status Partially Met      PT LONG TERM GOAL #2   Title The patient will present with improvement of lumbar flexion and extension strength to 4/5 for lifting.    Baseline 3+ to 4- out of 5    Time 8    Period Weeks    Status Achieved      PT LONG  TERM GOAL #3   Title The patient will be able to stand for 1 hour with low back pain <5/10 without taking pain medication.    Baseline 40 minutes 8/10 without pain medicaton    Time 8    Period Weeks    Status Achieved                   Plan - 10/04/20 0943     Clinical Impression Statement The patient reports feeling less fatigue today.  Resumed core stabilization exercises in standing and supine.  Progressed with squat and lift of 8 pounds.  The patient did need cuing for body mechanics and he reported muscle fatigue in the legs post the exercise.  The patient presents with improvement of core stabilization strength to 4/5.  He is reporting less pain with daily activities and he is no longer taking his pain medication.  Recommend added squat lift to the patient's HEP next session.  We will Re-assess for likely DC to HEP.  The patient has made great progress in therapy.    Personal Factors and Comorbidities Comorbidity 1    Comorbidities DM    Examination-Activity Limitations Carry;Transfers;Lift;Other    PT Treatment/Interventions ADLs/Self Care Home Management;Cryotherapy;Electrical Stimulation;Stair training;Functional mobility training;Traction;Therapeutic activities;Moist Heat;Therapeutic exercise;Neuromuscular re-education;Balance training;Manual techniques;Patient/family education;Passive range of motion;Dry needling;Spinal Manipulations;Joint Manipulations    PT Next Visit Plan re-assess for possible DC to HEP and add squat to HEP    PT Home Exercise Plan Access Code: [email or web address]    Consulted and Agree with Plan of Care Patient             Patient will benefit from skilled therapeutic intervention in order to improve the following deficits and impairments:  Pain, Hypermobility, Decreased activity tolerance, Decreased strength, Postural dysfunction  Visit Diagnosis: Chronic bilateral  low back pain without sciatica  Muscle weakness (generalized)     Problem  List Patient Active Problem List   Diagnosis Date Noted   Muscle weakness of left upper extremity 08/23/2020   Nonspecific chest pain 04/15/2019   Costochondral chest pain 04/15/2019   Palpitations 07/20/2018   Need for immunization against influenza 03/12/2018   Medication refill 08/26/2017   Pain in joint of right shoulder 07/08/2017   Rotator cuff tendonitis, left 05/05/2017   BPH (benign prostatic hyperplasia) 01/19/2017   Myalgia 12/16/2016   Insomnia 12/03/2016   Chronic, continuous use of opioids 03/18/2016   Neuropathic pain of left forearm 11/09/2015   Healthcare maintenance 11/09/2013   Erectile dysfunction 12/10/2012   PUD (peptic ulcer disease) 06/19/2010   Headache 09/13/2009   Allergic rhinitis 09/11/2009   Essential hypertension 06/01/2009   Back pain 09/15/2008   Type II diabetes mellitus with manifestations, uncontrolled ([place]) 09/16/1999   Rich Number, PT, DPT, OCS, Crt. DN  Bethena Midget 10/04/2020, 11:13 AM  [facility] [address] [place], [place], [address] Phone: [phone]   Fax:  [phone]  Name: Mitchell Page MRN: [id number] Date of Birth: February 12, 1956

## 2020-10-09 ENCOUNTER — Other Ambulatory Visit: Payer: Self-pay | Admitting: Family Medicine

## 2020-10-09 DIAGNOSIS — K219 Gastro-esophageal reflux disease without esophagitis: Secondary | ICD-10-CM

## 2020-10-11 ENCOUNTER — Other Ambulatory Visit: Payer: Self-pay

## 2020-10-11 ENCOUNTER — Ambulatory Visit: Payer: Medicare Other

## 2020-10-11 DIAGNOSIS — G8929 Other chronic pain: Secondary | ICD-10-CM

## 2020-10-11 DIAGNOSIS — M545 Low back pain, unspecified: Secondary | ICD-10-CM | POA: Diagnosis not present

## 2020-10-11 DIAGNOSIS — M6281 Muscle weakness (generalized): Secondary | ICD-10-CM

## 2020-10-11 NOTE — Therapy (Signed)
[place], [place], [address] Phone: [phone]   Fax:  [phone]  Physical Therapy Treatment / Discharge  Patient Details  Name: Mitchell Page MRN: [id number] Date of Birth: 13-Mar-1956 Referring Provider (PT): Kinnie Feil, MD  PHYSICAL THERAPY DISCHARGE SUMMARY  Visits from Start of Care: 08/21/20  Current functional level related to goals / functional outcomes: Goals met,  Stand, Walk, and Sit for 1 hour   Remaining deficits: Lift and carry   Education / Equipment: HEP   Patient agrees to discharge. Patient goals were met. Patient is being discharged due to meeting the stated rehab goals.   Encounter Date: 10/11/2020   PT End of Session - 10/11/20 1105     Visit Number 11    Number of Visits 17    Date for PT Re-Evaluation 10/16/20    Authorization Type [employer] MCD - 27 annual visits    PT Start Time 1101    PT Stop Time 1145    PT Time Calculation (min) 44 min    Activity Tolerance Patient tolerated treatment well;Patient limited by fatigue    Behavior During Therapy [facility] for tasks assessed/performed             Past Medical History:  Diagnosis Date   Allergic rhinitis    Chronic headache    Cough 04/21/2017   Diabetes mellitus    GERD (gastroesophageal reflux disease)    Hyperlipidemia    Hypertension    Illiteracy and low-level literacy    Unclear of his literacy  in his native tongue, however, he is unable to read in English   Lower back pain    w/ R LE sciatica s/p laminectomy in 2008. MRI- 04/2008    Past Surgical History:  Procedure Laterality Date   LUMBAR LAMINECTOMY  2008   l4-5   Repair of left arm fracture      There were no vitals filed for this visit.   Subjective Assessment - 10/11/20 1131     Subjective The patient reports that his back is still doing better.  He is not taking the pain medication.    How long can you sit comfortably? 1 hour with pain  2/10 or less    How long can you stand comfortably? 1 hour with pain 4/10 or less    How long can you walk comfortably? 1 hour    Currently in Pain? No/denies                [facility] PT Assessment - 10/11/20 0001       Assessment   Medical Diagnosis Chronic Low Back Pain    Referring Provider (PT) Kinnie Feil, MD      Observation/Other Assessments   Focus on Therapeutic Outcomes (FOTO)  69% - Lumbar      Squat   Comments 1/2 depth cuing for body mechanics      Posture/Postural Control   Posture Comments Reduction of Lumbar lordosis      AROM   Lumbar Flexion 100%    Lumbar Extension 80%    Lumbar - Right Side Bend To knee    Lumbar - Left Side Bend To knee    Lumbar - Right Rotation 100%    Lumbar - Left Rotation 100%      Strength   Overall Strength Within functional limits for tasks performed;Other (comment)    Overall Strength Comments Gross LE Strength 5/5    Lumbar Flexion 4/5  Lumbar Extension 4/5                           [facility] Adult PT Treatment/Exercise - 10/11/20 0001       Lumbar Exercises: Aerobic   Nustep L6 x 6 minutes      Lumbar Exercises: Standing   Functional Squats 10 reps    Functional Squats Limitations 10 pound lift    Shoulder Extension Theraband;20 reps    Theraband Level (Shoulder Extension) Level 4 (Blue)    Shoulder Extension Limitations wuth alt march    Other Standing Lumbar Exercises PPT at wall 3" x 10    Other Standing Lumbar Exercises Tband Press outs Black TB 5" x 10      Lumbar Exercises: Seated   Sit to Stand 15 reps    Sit to Stand Limitations chair squat      Lumbar Exercises: Supine   Bridge 20 reps    Bridge Limitations LE on SB    Basic Lumbar Stabilization 10 reps    Basic Lumbar Stabilization Limitations LTR w/ LE on SB    Large Ball Abdominal Isometric 10 reps    Large Ball Abdominal Isometric Limitations alt arm/LE lifts      Lumbar Exercises: Sidelying   Clam 20 reps;Left;Right     Clam Limitations GTB                      PT Short Term Goals - 10/11/20 1107       PT SHORT TERM GOAL #1   Title The patient will be independent in a basic HEP    Time 2    Period Weeks    Status Achieved    Target Date 09/04/20               PT Long Term Goals - 10/11/20 1107       PT LONG TERM GOAL #1   Title The patient will be able to sit for 1 hour and reports pain with standing < 4/10 without taking pain medicaton.    Baseline 20 minutes sitting and pain 8/10 without medication    Time 8    Period Weeks    Status Achieved      PT LONG TERM GOAL #2   Title The patient will present with improvement of lumbar flexion and extension strength to 4/5 for lifting.    Baseline 3+ to 4- out of 5    Time 8    Period Weeks    Status Achieved      PT LONG TERM GOAL #3   Title The patient will be able to stand for 1 hour with low back pain <5/10 without taking pain medication.    Baseline 40 minutes 8/10 without pain medicaton    Time 8    Period Weeks    Status Achieved                   Plan - 10/11/20 1104     Clinical Impression Statement The patient has met the set goals.  He is no longer taking his pain medication.  The patient has an established home exercise program to continue with.  Mild modifications were made today to his home program.  The patient presents with improvement of core strength and of lumbar range of motion.  The patient reports increase tolerance for prolong standing, walking, and sitting with less pain.  He was  provided with lower extremity strengthening exercises for lifting.  Recommend discharge to HEP at this time.    Personal Factors and Comorbidities Comorbidity 1    Comorbidities DM    Examination-Activity Limitations Carry;Lift    Examination-Participation Restrictions Shop;Yard Work    PT Next Visit Plan Discharge to home exercise program.    PT Home Exercise Plan Access Code: [email or web address]    Consulted and Agree  with Plan of Care Patient             Patient will benefit from skilled therapeutic intervention in order to improve the following deficits and impairments:     Visit Diagnosis: Chronic bilateral low back pain without sciatica  Muscle weakness (generalized)     Problem List Patient Active Problem List   Diagnosis Date Noted   Muscle weakness of left upper extremity 08/23/2020   Nonspecific chest pain 04/15/2019   Costochondral chest pain 04/15/2019   Palpitations 07/20/2018   Need for immunization against influenza 03/12/2018   Medication refill 08/26/2017   Pain in joint of right shoulder 07/08/2017   Rotator cuff tendonitis, left 05/05/2017   BPH (benign prostatic hyperplasia) 01/19/2017   Myalgia 12/16/2016   Insomnia 12/03/2016   Chronic, continuous use of opioids 03/18/2016   Neuropathic pain of left forearm 11/09/2015   Healthcare maintenance 11/09/2013   Erectile dysfunction 12/10/2012   PUD (peptic ulcer disease) 06/19/2010   Headache 09/13/2009   Allergic rhinitis 09/11/2009   Essential hypertension 06/01/2009   Back pain 09/15/2008   Type II diabetes mellitus with manifestations, uncontrolled ([place]) 09/16/1999   Rich Number, PT, DPT, OCS, Crt. DN  Bethena Midget 10/11/2020, 11:41 AM  [facility] [address] [place], [place], [address] Phone: [phone]   Fax:  [phone]  Name: Mitchell Page MRN: [id number] Date of Birth: [birth year]

## 2020-10-12 ENCOUNTER — Telehealth: Payer: Self-pay | Admitting: Family Medicine

## 2020-10-12 ENCOUNTER — Other Ambulatory Visit: Payer: Self-pay | Admitting: Family Medicine

## 2020-10-12 ENCOUNTER — Other Ambulatory Visit: Payer: Self-pay | Admitting: Student

## 2020-10-12 DIAGNOSIS — N529 Male erectile dysfunction, unspecified: Secondary | ICD-10-CM

## 2020-10-12 MED ORDER — LOSARTAN POTASSIUM 50 MG PO TABS: 50.0000 mg | ORAL_TABLET | Freq: Every day | ORAL | 0 refills | Status: DC

## 2020-10-12 MED ORDER — SILDENAFIL CITRATE 50 MG PO TABS: 50.0000 mg | ORAL_TABLET | Freq: Every day | ORAL | 1 refills | Status: DC | PRN

## 2020-10-12 NOTE — Telephone Encounter (Signed)
Looks like you did refill this medication but it was set to print and did not transmit to pharmacy. Please resend and set to normal if this should be sent to pharmacy. Moni Rothrock Zimmerman Rumple, CMA

## 2020-10-12 NOTE — Progress Notes (Unsigned)
Mitchell Page is a 65 y.o. male who is here for a well-child visit, accompanied by the {Persons; ped relatives w/o patient:19502}  PCP: Towanda Octave, MD  Current Issues: Current concerns include: ***.  Nutrition: Current diet: *** Adequate calcium in diet?: *** Supplements/ Vitamins: ***  Exercise/ Media: Sports/ Exercise: *** Media: hours per day: *** Media Rules or Monitoring?: {YES NO:22349}  Sleep:  Sleep:  *** Sleep apnea symptoms: {yes***/no:17258}   Social Screening: Lives with: *** Concerns regarding behavior? {yes***/no:17258} Activities and Chores?: *** Stressors of note: {Responses; yes**/no:17258}  Education: School: {gen school (grades [employer] School performance: {performance:16655} School Behavior: {misc; parental coping:16655}  Safety:  Bike safety: {CHL AMB PED [phone]} Car safety:  {CHL AMB PED AUTO:[phone]}  Screening Questions: Patient has a dental home: {yes/no***:64::"yes"} Risk factors for tuberculosis: {YES NO:22349:a: not discussed}  PSC completed: {yes no:314532} Results indicated:*** Results discussed with parents:{yes no:314532}  Objective:   There were no vitals taken for this visit. Growth percentile SmartLinks can only be used for patients less than 27 years old.  No results found.  Growth chart reviewed; growth parameters are appropriate for age: {yes BT:597416}  Physical Exam  Assessment and Plan:   65 y.o. male child here for well child care visit  BMI {ACTION; IS/IS LAG:53646803} appropriate for age The patient was counseled regarding {obesity counseling:18672}.  Development: {desc; development appropriate/delayed:19200}   Anticipatory guidance discussed: {guidance discussed, list:[phone]}  Hearing screening result:{normal/abnormal/not examined:14677} Vision screening result: {normal/abnormal/not examined:14677}  Counseling completed for {CHL AMB PED VACCINE COUNSELING:210130100} vaccine  components: No orders of the defined types were placed in this encounter.   No follow-ups on file.    [name], MD

## 2020-10-12 NOTE — Telephone Encounter (Signed)
Pt states the doctor gave his a paper to take to [facility]  Sildenafil 30 tablet 50mg .  The pharmacist said he needs a  prescription called in by the doctor.

## 2020-10-14 ENCOUNTER — Other Ambulatory Visit: Payer: Self-pay | Admitting: Family Medicine

## 2020-10-14 MED ORDER — SILDENAFIL CITRATE 50 MG PO TABS: 50.0000 mg | ORAL_TABLET | Freq: Every day | ORAL | 0 refills | Status: DC | PRN

## 2020-10-14 MED ORDER — SILDENAFIL CITRATE 50 MG PO TABS: 50.0000 mg | ORAL_TABLET | Freq: Every day | ORAL | 1 refills | Status: DC | PRN

## 2020-10-14 NOTE — Telephone Encounter (Signed)
I have sent this medication to his pharmacy and sent in as 'normal'.  Please could you let the patient know? Thank you!

## 2020-10-15 NOTE — Telephone Encounter (Signed)
Pt has been informed. Mitchell Page T Rhea Thrun, CMA  

## 2020-10-17 ENCOUNTER — Ambulatory Visit (INDEPENDENT_AMBULATORY_CARE_PROVIDER_SITE_OTHER): Payer: Medicare Other | Admitting: Family Medicine

## 2020-10-17 ENCOUNTER — Other Ambulatory Visit: Payer: Self-pay

## 2020-10-17 ENCOUNTER — Ambulatory Visit: Payer: Medicare Other | Admitting: Pharmacist

## 2020-10-17 DIAGNOSIS — M545 Low back pain, unspecified: Secondary | ICD-10-CM | POA: Diagnosis not present

## 2020-10-17 DIAGNOSIS — R519 Headache, unspecified: Secondary | ICD-10-CM | POA: Diagnosis not present

## 2020-10-17 DIAGNOSIS — G8929 Other chronic pain: Secondary | ICD-10-CM

## 2020-10-17 MED ORDER — TRAMADOL HCL 50 MG PO TABS: ORAL_TABLET | ORAL | 0 refills | Status: DC

## 2020-10-17 NOTE — Progress Notes (Signed)
     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 male presents for medication refill.  Headache Would like Fiorcet refill which is the only medication. Gets headaches 1-2 month, pain is located in temporal region. Triggers: pt is unsure.   Back pain Pt would like refill for tramadol for his joint pains especially in the winter months. He usually takes 50mg  tramadol, one tablet a day, 30 tablets a month. Tramadol finished yesterday.    Flowsheet Row Office Visit from 10/17/2020 in [facility]  PHQ-9 Total Score 2      Medications refilled.   PERTINENT  PMH / PSH: HTN, DM, T2DM, BPH  OBJECTIVE:   BP 132/75   Pulse 75   Ht 5\' 9"  (1.753 m)   Wt 155 lb 9.6 oz (70.6 kg)   SpO2 99%   BMI 22.98 kg/m    General: Alert, no acute distress Cardio: well perfused  Pulm: normal work of breathing Neuro: Cranial nerves grossly intact   ASSESSMENT/PLAN:   Headache Most likely tension headaches. Per chart review it is unclear whether he ever had migraines. Pt would like refill for Fiorcet. I explained that this is not the ideal choice of medication for tension headaches given that it contains butalbital, I am not comfortable prescribing it. I recommend tylenol and motrin PRN, staying adequately hydrated and eating regular meals. If this does not improve symptoms then he can come back and we can readdress his headaches. Pt was agreeable with this plan.   Back pain Chronic and stable. Refilled patient's tramadol today 50mg  daily PRN. I explained I would not refill this sooner than monthly. Pt expressed understanding and is happy with the plan.      [place], MD PGY-3 [facility]

## 2020-10-17 NOTE — Patient Instructions (Signed)
Thank you for coming to see me today. It was a pleasure. Today we discussed your headaches. I recommend taking tylenol and motrin when you get the headaches rather than the headache medication you were already taking.   I have refilled your tramadol for your back pain. You can take 30 tablets a month before another refill.  Please follow-up with me in 3-4 weeks   If you have any questions or concerns, please do not hesitate to call the office at [phone].  Best wishes,   Dr Allena Katz

## 2020-10-18 NOTE — Assessment & Plan Note (Signed)
Chronic and stable. Refilled patient's tramadol today 50mg  daily PRN. I explained I would not refill this sooner than monthly. Pt expressed understanding and is happy with the plan.

## 2020-10-18 NOTE — Assessment & Plan Note (Signed)
Most likely tension headaches. Per chart review it is unclear whether he ever had migraines. Pt would like refill for Fiorcet. I explained that this is not the ideal choice of medication for tension headaches given that it contains butalbital, I am not comfortable prescribing it. I recommend tylenol and motrin PRN, staying adequately hydrated and eating regular meals. If this does not improve symptoms then he can come back and we can readdress his headaches. Pt was agreeable with this plan.

## 2020-10-22 ENCOUNTER — Other Ambulatory Visit: Payer: Self-pay | Admitting: Family Medicine

## 2020-10-22 DIAGNOSIS — K219 Gastro-esophageal reflux disease without esophagitis: Secondary | ICD-10-CM

## 2020-10-24 ENCOUNTER — Other Ambulatory Visit: Payer: Self-pay

## 2020-10-24 DIAGNOSIS — IMO0002 Reserved for concepts with insufficient information to code with codable children: Secondary | ICD-10-CM

## 2020-10-24 DIAGNOSIS — N401 Enlarged prostate with lower urinary tract symptoms: Secondary | ICD-10-CM

## 2020-10-24 DIAGNOSIS — E1165 Type 2 diabetes mellitus with hyperglycemia: Secondary | ICD-10-CM

## 2020-10-24 MED ORDER — METFORMIN HCL 500 MG PO TABS: ORAL_TABLET | ORAL | 0 refills | Status: DC

## 2020-10-24 MED ORDER — TAMSULOSIN HCL 0.4 MG PO CAPS: ORAL_CAPSULE | ORAL | 0 refills | Status: DC

## 2020-12-05 ENCOUNTER — Other Ambulatory Visit: Payer: Self-pay

## 2020-12-05 ENCOUNTER — Telehealth (INDEPENDENT_AMBULATORY_CARE_PROVIDER_SITE_OTHER): Payer: Medicare Other | Admitting: Family Medicine

## 2020-12-05 DIAGNOSIS — R509 Fever, unspecified: Secondary | ICD-10-CM

## 2020-12-05 MED ORDER — ACETAMINOPHEN ER 650 MG PO TBCR: 650.0000 mg | EXTENDED_RELEASE_TABLET | Freq: Three times a day (TID) | ORAL | 0 refills | Status: AC | PRN

## 2020-12-05 MED ORDER — IBUPROFEN 200 MG PO TABS: 400.0000 mg | ORAL_TABLET | Freq: Four times a day (QID) | ORAL | 0 refills | Status: DC | PRN

## 2020-12-05 NOTE — Assessment & Plan Note (Addendum)
Broad differential for fever including COVID, flu, PNA or other viral illness. Less likely to be UTI given lack of urinary sx. Explained we do not have slots in [facility] today but recommended the pt can come in for testing and we can do a swab while he is in the parking lot. Pt said he felt dizzy and didn't think it was safe to drive. I offered him medication for pain-tylenol and ibuprofen which he can take as needed. He declined coming in for a covid test and said he would go to the urgent care instead.

## 2020-12-05 NOTE — Progress Notes (Addendum)
 [facility]  Patient consented to have virtual visit and was identified by name and date of birth. Method of visit: Telephone  Encounter participants: Patient: Mitchell Page - located at home   Provider: Towanda Octave - located at home Others (if applicable):   Chief Complaint: Fevers  HPI:  Fevers Pt endorses 1 day hx of fevers, chills, dizziness and aching all over etc. Lives with children and wife who are well. Denies covid contacts. Pt has had covid vaccines. Pt is disabled and does not work. Denies doing a covid test at home. Denies chest pain, palpitations, cough, dyspnea, vomiting, diarrhea, abdominal pain, dysuria, frequency, urgency or hematuria. Offered pt a covid and flu test today but pt said he is going to urgent care instead.   ROS: per HPI  Pertinent PMHx: PUD, T2DM, BPH   Exam:  There were no vitals taken for this visit.  Respiratory: speaking in full sentences   Assessment/Plan:  Fever Broad differential for fever including COVID, flu, PNA or other viral illness. Less likely to be UTI given lack of urinary sx. Explained we do not have slots in [facility] clinic today but recommended the pt can come in for testing and we can do a swab while he is in the parking lot. Pt said he felt dizzy and didn't think it was safe to drive. I offered him medication for pain-tylenol and ibuprofen which he can take as needed. He declined coming in for a covid test and said he would go to the urgent care instead.     Time spent during visit with patient: 10 minutes

## 2020-12-19 ENCOUNTER — Telehealth: Payer: Self-pay | Admitting: Family Medicine

## 2020-12-19 NOTE — Telephone Encounter (Signed)
Patient walked in and would like to request a refill of his tramadol. He also went to the pharmacy to request lantus pen and they said there were no refills.   Patient is completely out. Please call patient and let him know when refill is sent.   The best call back is [phone]

## 2020-12-20 ENCOUNTER — Other Ambulatory Visit: Payer: Self-pay | Admitting: Family Medicine

## 2020-12-20 DIAGNOSIS — E1165 Type 2 diabetes mellitus with hyperglycemia: Secondary | ICD-10-CM

## 2020-12-20 DIAGNOSIS — G8929 Other chronic pain: Secondary | ICD-10-CM

## 2020-12-20 DIAGNOSIS — M545 Low back pain, unspecified: Secondary | ICD-10-CM

## 2020-12-20 DIAGNOSIS — Z794 Long term (current) use of insulin: Secondary | ICD-10-CM

## 2020-12-20 MED ORDER — TRAMADOL HCL 50 MG PO TABS: ORAL_TABLET | ORAL | 0 refills | Status: DC

## 2020-12-20 MED ORDER — LANTUS SOLOSTAR 100 UNIT/ML ~~LOC~~ SOPN: PEN_INJECTOR | SUBCUTANEOUS | 3 refills | Status: DC

## 2020-12-20 NOTE — Telephone Encounter (Signed)
Called No VM. Sunday Spillers, CMA

## 2020-12-20 NOTE — Telephone Encounter (Signed)
I have refilled his medications. Pt will need in person visit next time for Tramadol refill as it is a controlled substance (clinic policy). Please could you inform the patient. Thank you.

## 2021-01-07 ENCOUNTER — Other Ambulatory Visit: Payer: Self-pay

## 2021-01-07 ENCOUNTER — Ambulatory Visit (INDEPENDENT_AMBULATORY_CARE_PROVIDER_SITE_OTHER): Payer: Medicare Other | Admitting: Family Medicine

## 2021-01-07 VITALS — BP 132/89 | HR 80 | Ht 69.0 in | Wt 155.8 lb

## 2021-01-07 DIAGNOSIS — L853 Xerosis cutis: Secondary | ICD-10-CM

## 2021-01-07 DIAGNOSIS — Z23 Encounter for immunization: Secondary | ICD-10-CM | POA: Diagnosis not present

## 2021-01-07 DIAGNOSIS — M545 Low back pain, unspecified: Secondary | ICD-10-CM

## 2021-01-07 MED ORDER — BACLOFEN 10 MG PO TABS: 10.0000 mg | ORAL_TABLET | Freq: Three times a day (TID) | ORAL | 0 refills | Status: DC

## 2021-01-07 MED ORDER — DICLOFENAC SODIUM 1 % EX GEL: 4.0000 g | Freq: Four times a day (QID) | CUTANEOUS | 0 refills | Status: DC | PRN

## 2021-01-07 NOTE — Progress Notes (Signed)
    SUBJECTIVE:   CHIEF COMPLAINT / HPI:   Back pain Patient reports that this weekend he lifted a 50 pound bag of rice and put it into the car and felt a "ripping sensation" in his back.  He reports he is able to walk okay but that any movement hurts it.  Here describes it as constant and cannot put a descriptors such as aching or stabbing pain onto it.  He reports that he is a little bit better when he is not having any movement but that he still having her baseline pain.  His Tylenol did not really help him.  He has not used heat or ice or any other conservative measures at this time.  Right foot dry skin Patient initially reported a swelling in his right foot, upon examination there was dry skin and patient reports that that is what he is concerned about.  He notes that he is a diabetic and is concerned about his feet at this time.  PERTINENT  PMH / PSH: Reviewed  OBJECTIVE:   BP 132/89   Pulse 80   Ht 5\' 9"  (1.753 m)   Wt 155 lb 12.8 oz (70.7 kg)   SpO2 99%   BMI 23.01 kg/m   General: NAD, well-appearing, well-nourished Respiratory: No respiratory distress, breathing comfortably, able to speak in full sentences Skin: warm and dry, no rashes noted on exposed skin.  Very dry and flaking skin on the medial aspect of the right foot, no ulcers or open wounds noted at this time. Psych: Appropriate affect and mood  Lumbar spine: - Inspection: no gross deformity or asymmetry, swelling or ecchymosis - Palpation: Significantly tender over the paraspinal muscles and SI joints - ROM: Decreased active ROM of the lumbar spine in flexion and extension with pain - Strength: 5/5 strength of lower extremity in L4-S1 nerve root distributions b/l; normal gait - Special testing: Negative straight leg raise  ASSESSMENT/PLAN:   Acute low back pain Likely muscle strain in the setting of acute lifting of heavy objects with a twisting motion.  Physical exam shows extreme tenderness to palpation with  some decreased range of motion secondary to pain.  Do not feel that imaging is warranted at this time, though will reconsider if the pain persists longer than expected.  Discussed doing gentle stretches, baclofen, Voltaren gel, as well as rest and ice/heat as needed.  Patient given strict return precautions.  Right foot concern Initially patient was reporting a "swelling", but then on exam there was dry skin on the foot.  He reports that that is what he is concerned about that it has not gone away.  Patient does use a moisturizer but not all of the time.  Given patient's history of diabetes, recommended that patient continue to use moisturizer to the skin and follow-up with his PCP in the next several weeks.  If there is no improvement we can consider referral to podiatry if patient requests.   , DO [facility]

## 2021-01-07 NOTE — Patient Instructions (Addendum)
I think that you strained some muscles in your back.  I am sending in a medication to help your muscles relax, you should not drive or use heavy machinery while taking this medication.  Do not take it at the same time as tramadol. I want you to do very gentle stretching about every hour or so while you are awake.  You  can also do ice or heat on the area, whichever feels better.  Please call and let me know if the medication is not helping and you are in significant pain.   Acute Back Pain, Adult Acute back pain is sudden and usually short-lived. It is often caused by an injury to the muscles and tissues in the back. The injury may result from: A muscle, tendon, or ligament getting overstretched or torn. Ligaments are tissues that connect bones to each other. Lifting something improperly can cause a back strain. Wear and tear (degeneration) of the spinal disks. Spinal disks are circular tissue that provide cushioning between the bones of the spine (vertebrae). Twisting motions, such as while playing sports or doing yard work. A hit to the back. Arthritis. You may have a physical exam, lab tests, and imaging tests to find the cause of your pain. Acute back pain usually goes away with rest and home care. Follow these instructions at home: Managing pain, stiffness, and swelling Take over-the-counter and prescription medicines only as told by your health care provider. Treatment may include medicines for pain and inflammation that are taken by mouth or applied to the skin, or muscle relaxants. Your health care provider may recommend applying ice during the first 24-48 hours after your pain starts. To do this: Put ice in a plastic bag. Place a towel between your skin and the bag. Leave the ice on for 20 minutes, 2-3 times a day. Remove the ice if your skin turns bright red. This is very important. If you cannot feel pain, heat, or cold, you have a greater risk of damage to the area. If directed, apply  heat to the affected area as often as told by your health care provider. Use the heat source that your health care provider recommends, such as a moist heat pack or a heating pad. Place a towel between your skin and the heat source. Leave the heat on for 20-30 minutes. Remove the heat if your skin turns bright red. This is especially important if you are unable to feel pain, heat, or cold. You have a greater risk of getting burned. Activity  Do not stay in bed. Staying in bed for more than 1-2 days can delay your recovery. Sit up and stand up straight. Avoid leaning forward when you sit or hunching over when you stand. If you work at a desk, sit close to it so you do not need to lean over. Keep your chin tucked in. Keep your neck drawn back, and keep your elbows bent at a 90-degree angle (right angle). Sit high and close to the steering wheel when you drive. Add lower back (lumbar) support to your car seat, if needed. Take short walks on even surfaces as soon as you are able. Try to increase the length of time you walk each day. Do not sit, drive, or stand in one place for more than 30 minutes at a time. Sitting or standing for long periods of time can put stress on your back. Do not drive or use heavy machinery while taking prescription pain medicine. Use proper lifting techniques.  When you bend and lift, use positions that put less stress on your back: [place] your knees. Keep the load close to your body. Avoid twisting. Exercise regularly as told by your health care provider. Exercising helps your back heal faster and helps prevent back injuries by keeping muscles strong and flexible. Work with a physical therapist to make a safe exercise program, as recommended by your health care provider. Do any exercises as told by your physical therapist. Lifestyle Maintain a healthy weight. Extra weight puts stress on your back and makes it difficult to have good posture. Avoid activities or situations  that make you feel anxious or stressed. Stress and anxiety increase muscle tension and can make back pain worse. Learn ways to manage anxiety and stress, such as through exercise. General instructions Sleep on a firm mattress in a comfortable position. Try lying on your side with your knees slightly bent. If you lie on your back, put a pillow under your knees. Keep your head and neck in a straight line with your spine (neutral position) when using electronic equipment like smartphones or pads. To do this: Raise your smartphone or pad to look at it instead of bending your head or neck to look down. Put the smartphone or pad at the level of your face while looking at the screen. Follow your treatment plan as told by your health care provider. This may include: Cognitive or behavioral therapy. Acupuncture or massage therapy. Meditation or yoga. Contact a health care provider if: You have pain that is not relieved with rest or medicine. You have increasing pain going down into your legs or buttocks. Your pain does not improve after 2 weeks. You have pain at night. You lose weight without trying. You have a fever or chills. You develop nausea or vomiting. You develop abdominal pain. Get help right away if: You develop new bowel or bladder control problems. You have unusual weakness or numbness in your arms or legs. You feel faint. These symptoms may represent a serious problem that is an emergency. Do not wait to see if the symptoms will go away. Get medical help right away. Call your local emergency services (911 in the [place] Do not drive yourself to the hospital. Summary Acute back pain is sudden and usually short-lived. Use proper lifting techniques. When you bend and lift, use positions that put less stress on your back. Take over-the-counter and prescription medicines only as told by your health care provider, and apply heat or ice as told. This information is not intended to replace  advice given to you by your health care provider. Make sure you discuss any questions you have with your health care provider. Document Revised: 05/25/2020 Document Reviewed: 05/25/2020 Elsevier Patient Education  2022 [employer].

## 2021-01-20 ENCOUNTER — Other Ambulatory Visit: Payer: Self-pay | Admitting: Family Medicine

## 2021-01-20 DIAGNOSIS — R3912 Poor urinary stream: Secondary | ICD-10-CM

## 2021-01-20 DIAGNOSIS — N401 Enlarged prostate with lower urinary tract symptoms: Secondary | ICD-10-CM

## 2021-01-29 ENCOUNTER — Other Ambulatory Visit: Payer: Self-pay | Admitting: Family Medicine

## 2021-01-29 DIAGNOSIS — G8929 Other chronic pain: Secondary | ICD-10-CM

## 2021-01-31 ENCOUNTER — Other Ambulatory Visit: Payer: Self-pay | Admitting: Family Medicine

## 2021-02-25 ENCOUNTER — Other Ambulatory Visit: Payer: Self-pay | Admitting: Family Medicine

## 2021-02-25 DIAGNOSIS — J302 Other seasonal allergic rhinitis: Secondary | ICD-10-CM

## 2021-02-26 NOTE — Telephone Encounter (Signed)
Medication not refilled. IF headaches are worsening, please schedule for visit.   Terisa Starr, MD  Family Medicine Teaching Service

## 2021-03-04 ENCOUNTER — Other Ambulatory Visit: Payer: Self-pay | Admitting: Family Medicine

## 2021-03-06 ENCOUNTER — Other Ambulatory Visit: Payer: Self-pay | Admitting: Family Medicine

## 2021-03-06 DIAGNOSIS — G8929 Other chronic pain: Secondary | ICD-10-CM

## 2021-03-07 ENCOUNTER — Telehealth: Payer: Self-pay | Admitting: Family Medicine

## 2021-03-07 NOTE — Telephone Encounter (Signed)
Pt  walked in to inquire about prescription for Tramadol stating the pharmacy said they sent a fax to the doctor 3 times and no response  please call patient and pharmacy.  Pt # [phone]

## 2021-03-07 NOTE — Telephone Encounter (Signed)
Contacted pharmacy about this and they filled it yesterday and they are going to reach out to pt and let him know it is ready for pick up. Ger Nicks Zimmerman Rumple, CMA

## 2021-03-21 ENCOUNTER — Other Ambulatory Visit: Payer: Self-pay | Admitting: Family Medicine

## 2021-03-21 DIAGNOSIS — K219 Gastro-esophageal reflux disease without esophagitis: Secondary | ICD-10-CM

## 2021-04-07 ENCOUNTER — Other Ambulatory Visit: Payer: Self-pay | Admitting: Family Medicine

## 2021-04-07 DIAGNOSIS — M545 Low back pain, unspecified: Secondary | ICD-10-CM

## 2021-04-07 DIAGNOSIS — G8929 Other chronic pain: Secondary | ICD-10-CM

## 2021-04-08 ENCOUNTER — Other Ambulatory Visit: Payer: Self-pay | Admitting: Family Medicine

## 2021-04-08 DIAGNOSIS — M545 Low back pain, unspecified: Secondary | ICD-10-CM

## 2021-04-08 DIAGNOSIS — G8929 Other chronic pain: Secondary | ICD-10-CM

## 2021-04-11 ENCOUNTER — Other Ambulatory Visit: Payer: Self-pay | Admitting: Family Medicine

## 2021-04-13 ENCOUNTER — Other Ambulatory Visit: Payer: Self-pay

## 2021-04-13 ENCOUNTER — Ambulatory Visit (HOSPITAL_COMMUNITY)
Admission: EM | Admit: 2021-04-13 | Discharge: 2021-04-13 | Disposition: A | Discharge status: Home / Self Care | Payer: Medicare Other | Attending: Family Medicine | Admitting: Family Medicine

## 2021-04-13 ENCOUNTER — Encounter (HOSPITAL_COMMUNITY): Payer: Self-pay

## 2021-04-13 DIAGNOSIS — B349 Viral infection, unspecified: Secondary | ICD-10-CM | POA: Diagnosis present

## 2021-04-13 DIAGNOSIS — R52 Pain, unspecified: Secondary | ICD-10-CM | POA: Insufficient documentation

## 2021-04-13 DIAGNOSIS — Z20822 Contact with and (suspected) exposure to covid-19: Secondary | ICD-10-CM | POA: Insufficient documentation

## 2021-04-13 DIAGNOSIS — E1165 Type 2 diabetes mellitus with hyperglycemia: Secondary | ICD-10-CM | POA: Insufficient documentation

## 2021-04-13 LAB — POCT URINALYSIS DIPSTICK, ED / UC
Bilirubin Urine: NEGATIVE
Glucose, UA: >=1000 mg/dL — AB
Hgb urine dipstick: NEGATIVE
Ketones, ur: NEGATIVE mg/dL
Leukocytes,Ua: NEGATIVE
Nitrite: NEGATIVE
Protein, ur: NEGATIVE mg/dL
Specific Gravity, Urine: 1.01 (ref 1.005–1.030)
Urobilinogen, UA: 0.2 mg/dL (ref 0.0–1.0)
pH: 6.5 (ref 5.0–8.0)

## 2021-04-13 LAB — CBG MONITORING, ED: Glucose-Capillary: 178 mg/dL — ABNORMAL HIGH (ref 70–99)

## 2021-04-13 MED ORDER — DIPHENOXYLATE-ATROPINE 2.5-0.025 MG PO TABS
1.0000 | ORAL_TABLET | Freq: Four times a day (QID) | ORAL | 0 refills | Status: DC | PRN
Start: 2021-04-13 — End: 2022-05-30

## 2021-04-13 MED ORDER — KETOROLAC TROMETHAMINE 30 MG/ML IJ SOLN
INTRAMUSCULAR | Status: AC
  Filled 2021-04-13: qty 1

## 2021-04-13 MED ORDER — KETOROLAC TROMETHAMINE 30 MG/ML IJ SOLN
30.0000 mg | Freq: Once | INTRAMUSCULAR | Status: AC
Start: 2021-04-13 — End: 2021-04-13
  Administered 2021-04-13: 30 mg via INTRAMUSCULAR

## 2021-04-13 MED ORDER — IPRATROPIUM BROMIDE 0.03 % NA SOLN: 2.0000 | Freq: Three times a day (TID) | NASAL | 0 refills | Status: DC | PRN

## 2021-04-13 NOTE — ED Triage Notes (Signed)
Pt presnts with c/o dizziness, cough and muscle pain when coughing. States when he blows his nose, blood comes out. States his body aches and states he feels tired.States he has diarrhea.

## 2021-04-13 NOTE — ED Notes (Signed)
Covid swab in lab.  Patient sent to the bathroom for urine

## 2021-04-13 NOTE — Discharge Instructions (Addendum)
Respiratory viral testing will result in 24 hours. Our office will call you if your testing is abnormal.

## 2021-04-13 NOTE — ED Provider Notes (Signed)
[place]    CSN: [id number] Arrival date & time: 04/13/21  1318      History   Chief Complaint Chief Complaint  Patient presents with   Diarrhea   Dizziness   Generalized Body Aches   Back Pain   Arm Pain    HPI Mitchell Page is a 66 y.o. male.   HPI Patient presents today with dizziness, diarrhea, cough, generalized body pain, also nasal congestion. All symptoms started yesterday. He had to leave work due to his symptoms. Diarrhea last night. Not eating today. Denies any abdominal pain. Stool is loose not watery. He works full-time at [employer] and suspects he caught something while working. He became alarmed when he noticed blood in his mucus. His cough is non-productive and he is without fever. Requesting a few days off work to recover from symptoms. He denies SOB or chest pain/tightness. He has not taken any medication for symptoms. Past Medical History:  Diagnosis Date   Allergic rhinitis    Chronic headache    Cough 04/21/2017   Diabetes mellitus    GERD (gastroesophageal reflux disease)    Hyperlipidemia    Hypertension    Illiteracy and low-level literacy    Unclear of his literacy  in his native tongue, however, he is unable to read in English   Lower back pain    w/ R LE sciatica s/p laminectomy in 2008. MRI- 04/2008    Patient Active Problem List   Diagnosis Date Noted   Fever 12/05/2020   Muscle weakness of left upper extremity 08/23/2020   Nonspecific chest pain 04/15/2019   Costochondral chest pain 04/15/2019   Palpitations 07/20/2018   Need for immunization against influenza 03/12/2018   Medication refill 08/26/2017   Pain in joint of right shoulder 07/08/2017   Rotator cuff tendonitis, left 05/05/2017   BPH (benign prostatic hyperplasia) 01/19/2017   Myalgia 12/16/2016   Insomnia 12/03/2016   Chronic, continuous use of opioids 03/18/2016   Neuropathic pain of left forearm 11/09/2015   Healthcare maintenance 11/09/2013    Erectile dysfunction 12/10/2012   PUD (peptic ulcer disease) 06/19/2010   Headache 09/13/2009   Allergic rhinitis 09/11/2009   Essential hypertension 06/01/2009   Back pain 09/15/2008   Type II diabetes mellitus with manifestations, uncontrolled ([place]) 09/16/1999    Past Surgical History:  Procedure Laterality Date   LUMBAR LAMINECTOMY  2008   l4-5   Repair of left arm fracture         Home Medications    Prior to Admission medications   Medication Sig Start Date End Date Taking? Authorizing Provider  diphenoxylate-atropine (LOMOTIL) 2.5-0.025 MG tablet Take 1 tablet by mouth 4 (four) times daily as needed for diarrhea or loose stools. 04/13/21  Yes Scot Jun, FNP  ipratropium (ATROVENT) 0.03 % nasal spray Place 2 sprays into both nostrils 3 (three) times daily as needed for rhinitis. 04/13/21  Yes Scot Jun, FNP  Accu-Chek Softclix Lancets lancets Use as instructed 05/14/20   Gifford Shave, MD  amLODipine (NORVASC) 10 MG tablet TAKE 1 TABLET(10 MG) BY MOUTH AT BEDTIME 03/21/21   Lattie Haw, MD  atorvastatin (LIPITOR) 40 MG tablet TAKE 1 TABLET(40 MG) BY MOUTH DAILY 07/23/20   Wilber Oliphant, MD  baclofen (LIORESAL) 10 MG tablet Take 1 tablet (10 mg total) by mouth 3 (three) times daily. 01/07/21   Lilland, Alana, DO  Blood Glucose Monitoring Suppl (ACCU-CHEK GUIDE) w/Device KIT 1 kit by Does not apply route daily.  Use to test blood glucose up to 3 times daily. Dx E11.8, E11.65, Z79.4 05/14/20   Gifford Shave, MD  Blood Pressure Monitoring (BLOOD PRESSURE CUFF) MISC 1 Units by Does not apply route daily. 04/12/20   Gifford Shave, MD  butalbital-acetaminophen-caffeine (FIORICET) [phone] MG tablet  04/23/20   [provider]  cetirizine (ZYRTEC) 10 MG tablet TAKE 1 TABLET(10 MG) BY MOUTH DAILY 02/26/21   Martyn Malay, MD  diclofenac Sodium (VOLTAREN) 1 % GEL Apply 4 g topically 4 (four) times daily as needed. 01/07/21   Lilland, Alana, DO  empagliflozin  (JARDIANCE) 10 MG TABS tablet Take 1 tablet (10 mg total) by mouth daily. 05/16/20   Leavy Cella, RPH-CPP  fluticasone (FLONASE) 50 MCG/ACT nasal spray SHAKE LIQUID AND USE 2 SPRAYS IN EACH NOSTRIL DAILY 04/12/20   Gifford Shave, MD  gabapentin (NEURONTIN) 300 MG capsule TAKE 1 CAPSULE(300 MG) BY MOUTH TWICE DAILY 04/11/21   Lattie Haw, MD  glucose blood (ACCU-CHEK GUIDE) test strip Use to test blood glucose up to 3 times daily. Dx E11.8, E11.65, Z79.4 05/14/20   Gifford Shave, MD  ibuprofen (ADVIL) 200 MG tablet Take 2 tablets (400 mg total) by mouth every 6 (six) hours as needed. 12/05/20   Lattie Haw, MD  insulin glargine (LANTUS SOLOSTAR) 100 UNIT/ML Solostar Pen INJECT 20 Units at the same time each day, please record your blood sugars for your doctor 12/20/20   Lattie Haw, MD  Insulin Pen Needle (B-D UF III MINI PEN NEEDLES) 31G X 5 MM MISC USE ONCE DAILY AS DIRECTED FOR INSULIN INJECTION 04/06/20   Wilber Oliphant, MD  Lancets Misc. (ACCU-CHEK SOFTCLIX LANCET DEV) KIT Use as needed to check blood sugars 05/14/20   Gifford Shave, MD  losartan (COZAAR) 50 MG tablet TAKE 1 TABLET(50 MG) BY MOUTH DAILY 01/31/21   Carollee Leitz, MD  metFORMIN (GLUCOPHAGE) 500 MG tablet TAKE 2 TABLETS(1000 MG) BY MOUTH TWICE DAILY WITH A MEAL 01/24/21   Carollee Leitz, MD  Multiple Vitamins-Minerals (CENTRUM MEN) TABS Take 1 tablet by mouth daily.    [provider]  Omega-3 Fatty Acids (FISH OIL PO) Take by mouth.    [provider]  omeprazole (PRILOSEC) 40 MG capsule TAKE 1 CAPSULE BY MOUTH DAILY 30 MINUTES BEFORE BREAKFAST 03/25/21   Lattie Haw, MD  sildenafil (VIAGRA) 50 MG tablet Take 1 tablet (50 mg total) by mouth daily as needed for erectile dysfunction. 10/14/20 11/13/20  Lattie Haw, MD  sodium chloride (OCEAN) 0.65 % SOLN nasal spray Place 1 spray into both nostrils as needed for congestion. 07/24/15   Elnora Morrison, MD  tamsulosin (FLOMAX) 0.4 MG CAPS capsule TAKE 1 CAPSULE BY  MOUTH DAILY 01/24/21   Carollee Leitz, MD  traMADol (ULTRAM) 50 MG tablet TAKE 1 TABLET(50 MG) BY MOUTH DAILY AS NEEDED FOR MODERATE PAIN 04/08/21   Lattie Haw, MD    Family History Family History  Problem Relation Age of Onset   Heart disease Father     Social History Social History   Tobacco Use   Smoking status: Never   Smokeless tobacco: Never  Vaping Use   Vaping Use: Never used  Substance Use Topics   Alcohol use: No    Alcohol/week: 0.0 standard drinks   Drug use: No     Allergies   Patient has no known allergies.   Review of Systems Review of Systems Pertinent negatives listed in HPI  Physical Exam Triage Vital Signs ED Triage Vitals  Enc  Vitals Group     BP 04/13/21 1431 (!) 146/77     Pulse Rate 04/13/21 1431 74     Resp 04/13/21 1431 18     Temp 04/13/21 1431 97.8 F (36.6 C)     Temp Source 04/13/21 1431 Oral     SpO2 04/13/21 1431 96 %     Weight --      Height --      Head Circumference --      Peak Flow --      Pain Score 04/13/21 1430 10     Pain Loc --      Pain Edu? --      Excl. in [place]? --    No data found.  Updated Vital Signs BP (!) 146/77 (BP Location: Right Arm)    Pulse 74    Temp 97.8 F (36.6 C) (Oral)    Resp 18    SpO2 96%   Visual Acuity Right Eye Distance:   Left Eye Distance:   Bilateral Distance:    Right Eye Near:   Left Eye Near:    Bilateral Near:     Physical Exam Constitutional:      Appearance: Normal appearance.  HENT:     Head: Normocephalic and atraumatic.     Right Ear: Tympanic membrane, ear canal and external ear normal.     Left Ear: Tympanic membrane, ear canal and external ear normal.     Nose: Congestion present.     Mouth/Throat:     Comments: Dried blood present left nares Eyes:     Extraocular Movements: Extraocular movements intact.     Pupils: Pupils are equal, round, and reactive to light.  Cardiovascular:     Rate and Rhythm: Normal rate and regular rhythm.  Pulmonary:     Effort:  Pulmonary effort is normal.     Breath sounds: Normal breath sounds.  Abdominal:     General: Abdomen is flat. Bowel sounds are normal.  Musculoskeletal:     Cervical back: Normal range of motion.  Skin:    General: Skin is warm and dry.     Capillary Refill: Capillary refill takes less than 2 seconds.  Neurological:     General: No focal deficit present.     Mental Status: He is alert and oriented to person, place, and time.  Psychiatric:        Mood and Affect: Mood normal.        Behavior: Behavior normal.        Thought Content: Thought content normal.        Judgment: Judgment normal.     UC Treatments / Results  Labs (all labs ordered are listed, but only abnormal results are displayed) Labs Reviewed  CBG MONITORING, ED - Abnormal; Notable for the following components:      Result Value   Glucose-Capillary 178 (*)    All other components within normal limits  POCT URINALYSIS DIPSTICK, ED / UC - Abnormal; Notable for the following components:   Glucose, UA >=1000 (*)    All other components within normal limits  RESPIRATORY PANEL BY PCR  SARS CORONAVIRUS 2 (TAT 6-24 HRS)    EKG   Radiology No results found.  Procedures Procedures (including critical care time)  Medications Ordered in UC Medications  ketorolac (TORADOL) 30 MG/ML injection 30 mg (30 mg Intramuscular Given 04/13/21 1542)    Initial Impression / Assessment and Plan / UC Course  I have reviewed the triage  vital signs and the nursing notes.  Pertinent labs & imaging results that were available during my care of the patient were reviewed by me and considered in my medical decision making (see chart for details).     Viral illness with generalized body aches Respiratory panel pending  Lomotil to treat loose stool which I suspect is symptom of acute viral illness. Atrovent nasal spray for congestion. Checked blood sugar due to GI symptoms, mildly hyperglycemic. Continue home medications.  Work  note provided. Strict return precautions. Patient verbalized understanding and agreement with plan. Final Clinical Impressions(s) / UC Diagnoses   Final diagnoses:  Viral illness  Generalized body aches     Discharge Instructions      Respiratory viral testing will result in 24 hours. Our office will call you if your testing is abnormal.      ED Prescriptions     Medication Sig Dispense Auth. Provider   ipratropium (ATROVENT) 0.03 % nasal spray Place 2 sprays into both nostrils 3 (three) times daily as needed for rhinitis. 30 mL Scot Jun, FNP   diphenoxylate-atropine (LOMOTIL) 2.5-0.025 MG tablet Take 1 tablet by mouth 4 (four) times daily as needed for diarrhea or loose stools. 16 tablet Scot Jun, FNP      PDMP not reviewed this encounter.   Scot Jun, [place] 04/17/21 [id number]

## 2021-04-14 ENCOUNTER — Other Ambulatory Visit: Payer: Self-pay | Admitting: Family Medicine

## 2021-04-14 LAB — RESPIRATORY PANEL BY PCR

## 2021-04-14 LAB — SARS CORONAVIRUS 2 (TAT 6-24 HRS): SARS Coronavirus 2: NEGATIVE

## 2021-04-16 NOTE — Progress Notes (Signed)
° ° °  SUBJECTIVE:   CHIEF COMPLAINT / HPI: Low back pain  Follow up from recent Urgent Care visit for viral gastroenteritis.  Patient reports diarrhea has resolved.   Low back pain Patient reports low back pain has been ongoing since back surgery in 2007.  He has been off for for at 16 years on disability.  Recently disability has been stopped and he had to restart work a week ago at the [employer].  He had worked there for about 4 days when he is back acted up and went to the ED. was given Toradol injection with some relief.  Patient endorses right lower back pain that radiates down back of thigh and calf.  Denies any fevers, weakness, numbness or tingling, incontinence of bowel or bladder, no saddle paresthesia, no recent falls or injuries.  PERTINENT  PMH / PSH:  Chronic low back pain status post surgery 2007  OBJECTIVE:   BP 120/75    Pulse 74    Temp 98.2 F (36.8 C)    Wt 155 lb 6.4 oz (70.5 kg)    SpO2 97%    BMI 22.95 kg/m    General: Alert, no acute distress No gross deformity, scoliosis.  Old healed surgical incision along lumbar spinal column. TTP along lumbar paraspinal muscles right greater than left.  No midline or bony TTP. FROM. Strength LEs 5/5 all muscle groups.   2+ MSRs in patellar and achilles tendons, equal bilaterally. Positive SLRs bilaterally Sensation intact to light touch bilaterally. Negative logroll bilateral hips Positive Faber's and piriformis stretches. Negative FADIR's   ASSESSMENT/PLAN:   Back pain Chronic back pain s/p Lumbar Laminectomy 2008.  No red flags on exam.   -Toradol 30 mg IM now -Ibuprofen as needed, start at least 8 hrs after injection today. -Will refer to Ortho for further evaluation -Follow up as needed  Type II diabetes mellitus with manifestations, uncontrolled ([facility]) A1c today  Essential hypertension CMet today  Healthcare maintenance Refer for Colonoscopy Refer for Ophthalmology Shingrix prescription  sent Lipid panel today     Dana Allan, MD [facility]

## 2021-04-17 ENCOUNTER — Ambulatory Visit (INDEPENDENT_AMBULATORY_CARE_PROVIDER_SITE_OTHER): Payer: Medicare Other | Admitting: Family Medicine

## 2021-04-17 ENCOUNTER — Other Ambulatory Visit: Payer: Self-pay

## 2021-04-17 VITALS — BP 120/75 | HR 74 | Temp 98.2°F | Wt 155.4 lb

## 2021-04-17 DIAGNOSIS — I10 Essential (primary) hypertension: Secondary | ICD-10-CM | POA: Diagnosis not present

## 2021-04-17 DIAGNOSIS — Z1211 Encounter for screening for malignant neoplasm of colon: Secondary | ICD-10-CM | POA: Diagnosis not present

## 2021-04-17 DIAGNOSIS — Z794 Long term (current) use of insulin: Secondary | ICD-10-CM

## 2021-04-17 DIAGNOSIS — E1165 Type 2 diabetes mellitus with hyperglycemia: Secondary | ICD-10-CM | POA: Diagnosis not present

## 2021-04-17 DIAGNOSIS — Z0000 Encounter for general adult medical examination without abnormal findings: Secondary | ICD-10-CM

## 2021-04-17 DIAGNOSIS — M5441 Lumbago with sciatica, right side: Secondary | ICD-10-CM

## 2021-04-17 DIAGNOSIS — H3500 Unspecified background retinopathy: Secondary | ICD-10-CM

## 2021-04-17 LAB — POCT GLYCOSYLATED HEMOGLOBIN (HGB A1C): HbA1c, POC (controlled diabetic range): 8.8 % — AB (ref 0.0–7.0)

## 2021-04-17 MED ORDER — KETOROLAC TROMETHAMINE 30 MG/ML IJ SOLN: 30.0000 mg | Freq: Once | INTRAMUSCULAR | Status: DC

## 2021-04-17 MED ORDER — KETOROLAC TROMETHAMINE 60 MG/2ML IM SOLN
60.0000 mg | Freq: Once | INTRAMUSCULAR | Status: AC
  Administered 2021-04-17: 60 mg via INTRAMUSCULAR

## 2021-04-17 MED ORDER — SHINGRIX 50 MCG/0.5ML IM SUSR: 0.5000 mL | Freq: Once | INTRAMUSCULAR | 1 refills | Status: AC

## 2021-04-17 NOTE — Assessment & Plan Note (Signed)
A1c today.  

## 2021-04-17 NOTE — Assessment & Plan Note (Signed)
Chronic back pain s/p Lumbar Laminectomy 2008.  No red flags on exam.   -Toradol 30 mg IM now -Ibuprofen as needed, start at least 8 hrs after injection today. -Will refer to Ortho for further evaluation -Follow up as needed

## 2021-04-17 NOTE — Assessment & Plan Note (Signed)
C-Met today 

## 2021-04-17 NOTE — Assessment & Plan Note (Signed)
Refer for Colonoscopy Refer for Ophthalmology Shingrix prescription sent Lipid panel today

## 2021-04-17 NOTE — Patient Instructions (Signed)
Thank you for coming to see me today. It was a pleasure.   Glad to hear that you are diarrhea has now resolved.  Likely her symptoms are from your recent viral infection.  Continue to make sure that you are well-hydrated.  For your back pain I am giving you a injection of Toradol today.  Do not take any ibuprofen for at least 8 hours after the injection.  You can then resume ibuprofen or Tylenol as needed.  I will refer you to back specialist for your back pain.  They will call you to schedule an appointment.  Colonoscopy is now due.  I will send a referral to [facility].  They will call you with an appointment.  We will get some labs today.  If they are abnormal or we need to do something about them, I will call you.  If they are normal, I will send you a message on MyChart (if it is active) or a letter in the mail.  If you don't hear from [place] in 2 weeks, please call the office at the number below.    Please follow-up with PCP for diabetic foot exam and diabetic management.  If you have any questions or concerns, please do not hesitate to call the office at [phone].  Best,   Dana Allan, MD    Sciatica Sciatica is pain, weakness, tingling, or loss of feeling (numbness) along the sciatic nerve. The sciatic nerve starts in the lower back and goes down the back of each leg. Sciatica usually goes away on its own or with treatment. Sometimes, sciatica may come back (recur). What are the causes? This condition happens when the sciatic nerve is pinched or has pressure put on it. This may be the result of: A disk in between the bones of the spine bulging out too far (herniated disk). Changes in the spinal disks that occur with aging. A condition that affects a muscle in the butt. Extra bone growth near the sciatic nerve. A break (fracture) of the area between your hip bones (pelvis). Pregnancy. Tumor. This is rare. What increases the risk? You are more likely to develop this condition  if you: Play sports that put pressure or stress on the spine. Have poor strength and ease of movement (flexibility). Have had a back injury in the past. Have had back surgery. Sit for long periods of time. Do activities that involve bending or lifting over and over again. Are very overweight (obese). What are the signs or symptoms? Symptoms can vary from mild to very bad. They may include: Any of these problems in the lower back, leg, hip, or butt: Mild tingling, loss of feeling, or dull aches. Burning sensations. Sharp pains. Loss of feeling in the back of the calf or the sole of the foot. Leg weakness. Very bad back pain that makes it hard to move. These symptoms may get worse when you cough, sneeze, or laugh. They may also get worse when you sit or stand for long periods of time. How is this treated? This condition often gets better without any treatment. However, treatment may include: Changing or cutting back on physical activity when you have pain. Doing exercises and stretching. Putting ice or heat on the affected area. Medicines that help: To relieve pain and swelling. To relax your muscles. Shots (injections) of medicines that help to relieve pain, irritation, and swelling. Surgery. Follow these instructions at home: Medicines Take over-the-counter and prescription medicines only as told by your  doctor. Ask your doctor if the medicine prescribed to you: Requires you to avoid driving or using heavy machinery. Can cause trouble pooping (constipation). You may need to take these steps to prevent or treat trouble pooping: Drink enough fluids to keep your pee (urine) pale yellow. Take over-the-counter or prescription medicines. Eat foods that are high in fiber. These include beans, whole grains, and fresh fruits and vegetables. Limit foods that are high in fat and sugar. These include fried or sweet foods. Managing pain   If told, put ice on the affected area. Put ice in  a plastic bag. Place a towel between your skin and the bag. Leave the ice on for 20 minutes, 2-3 times a day. If told, put heat on the affected area. Use the heat source that your doctor tells you to use, such as a moist heat pack or a heating pad. Place a towel between your skin and the heat source. Leave the heat on for 20-30 minutes. Remove the heat if your skin turns bright red. This is very important if you are unable to feel pain, heat, or cold. You may have a greater risk of getting burned. Activity  Return to your normal activities as told by your doctor. Ask your doctor what activities are safe for you. Avoid activities that make your symptoms worse. Take short rests during the day. When you rest for a long time, do some physical activity or stretching between periods of rest. Avoid sitting for a long time without moving. Get up and move around at least one time each hour. Exercise and stretch regularly, as told by your doctor. Do not lift anything that is heavier than 10 lb (4.5 kg) while you have symptoms of sciatica. Avoid lifting heavy things even when you do not have symptoms. Avoid lifting heavy things over and over. When you lift objects, always lift in a way that is safe for your body. To do this, you should: Bend your knees. Keep the object close to your body. Avoid twisting. General instructions Stay at a healthy weight. Wear comfortable shoes that support your feet. Avoid wearing high heels. Avoid sleeping on a mattress that is too soft or too hard. You might have less pain if you sleep on a mattress that is firm enough to support your back. Keep all follow-up visits as told by your doctor. This is important. Contact a doctor if: You have pain that: Wakes you up when you are sleeping. Gets worse when you lie down. Is worse than the pain you have had in the past. Lasts longer than 4 weeks. You lose weight without trying. Get help right away if: You cannot control  when you pee (urinate) or poop (have a bowel movement). You have weakness in any of these areas and it gets worse: Lower back. The area between your hip bones. Butt. Legs. You have redness or swelling of your back. You have a burning feeling when you pee. Summary Sciatica is pain, weakness, tingling, or loss of feeling (numbness) along the sciatic nerve. This condition happens when the sciatic nerve is pinched or has pressure put on it. Sciatica can cause pain, tingling, or loss of feeling (numbness) in the lower back, legs, hips, and butt. Treatment often includes rest, exercise, medicines, and putting ice or heat on the affected area. This information is not intended to replace advice given to you by your health care provider. Make sure you discuss any questions you have with your health care provider.  Document Revised: 03/22/2018 Document Reviewed: 03/22/2018 Elsevier Patient Education  2022 [employer]

## 2021-04-18 ENCOUNTER — Telehealth: Payer: Self-pay | Admitting: *Deleted

## 2021-04-18 LAB — COMPREHENSIVE METABOLIC PANEL
ALT: 22 IU/L (ref 0–44)
AST: 17 IU/L (ref 0–40)
Albumin/Globulin Ratio: 1.6 (ref 1.2–2.2)
Albumin: 4.5 g/dL (ref 3.8–4.8)
Alkaline Phosphatase: 54 IU/L (ref 44–121)
BUN/Creatinine Ratio: 16 (ref 10–24)
BUN: 14 mg/dL (ref 8–27)
Bilirubin Total: 1 mg/dL (ref 0.0–1.2)
CO2: 23 mmol/L (ref 20–29)
Calcium: 9.8 mg/dL (ref 8.6–10.2)
Chloride: 102 mmol/L (ref 96–106)
Creatinine, Ser: 0.87 mg/dL (ref 0.76–1.27)
Globulin, Total: 2.9 g/dL (ref 1.5–4.5)
Glucose: 175 mg/dL — ABNORMAL HIGH (ref 70–99)
Potassium: 4.1 mmol/L (ref 3.5–5.2)
Sodium: 139 mmol/L (ref 134–144)
Total Protein: 7.4 g/dL (ref 6.0–8.5)
eGFR: 96 mL/min/{1.73_m2} (ref >59)

## 2021-04-18 LAB — LIPID PANEL
Chol/HDL Ratio: 1.9 ratio (ref 0.0–5.0)
Cholesterol, Total: 94 mg/dL — ABNORMAL LOW (ref 100–199)
HDL: 49 mg/dL (ref >39)
LDL Chol Calc (NIH): 34 mg/dL (ref 0–99)
Triglycerides: 40 mg/dL (ref 0–149)
VLDL Cholesterol Cal: 11 mg/dL (ref 5–40)

## 2021-04-18 NOTE — Telephone Encounter (Signed)
Pt walks in with letter he was given yesterday.   His work is requiring an end date for the restrictions.  Spoke with Dr. Clent Ridges, she is unsure of end date as she would like for him to be further evaluated by ortho.  Provided an updated letter with that info.  Pt is aware of plan and will be waiting for a call from ortho.  Jone Baseman, CMA

## 2021-04-20 ENCOUNTER — Other Ambulatory Visit: Payer: Self-pay | Admitting: Family Medicine

## 2021-04-20 DIAGNOSIS — K219 Gastro-esophageal reflux disease without esophagitis: Secondary | ICD-10-CM

## 2021-04-20 DIAGNOSIS — N401 Enlarged prostate with lower urinary tract symptoms: Secondary | ICD-10-CM

## 2021-04-22 NOTE — Addendum Note (Signed)
Addended by: Enid Cutter on: 04/22/2021 07:39 AM   Modules accepted: Orders

## 2021-04-24 ENCOUNTER — Other Ambulatory Visit: Payer: Self-pay | Admitting: *Deleted

## 2021-04-24 NOTE — Telephone Encounter (Signed)
Opened in error.Mitchell Page, CMA  

## 2021-05-03 ENCOUNTER — Other Ambulatory Visit: Payer: Self-pay | Admitting: *Deleted

## 2021-05-03 DIAGNOSIS — Z794 Long term (current) use of insulin: Secondary | ICD-10-CM

## 2021-05-03 DIAGNOSIS — E1169 Type 2 diabetes mellitus with other specified complication: Secondary | ICD-10-CM

## 2021-05-03 MED ORDER — BD PEN NEEDLE MINI U/F 31G X 5 MM MISC: 0 refills | Status: DC

## 2021-05-10 ENCOUNTER — Other Ambulatory Visit: Payer: Self-pay | Admitting: Family Medicine

## 2021-05-10 DIAGNOSIS — R3912 Poor urinary stream: Secondary | ICD-10-CM

## 2021-05-10 DIAGNOSIS — N401 Enlarged prostate with lower urinary tract symptoms: Secondary | ICD-10-CM

## 2021-05-15 ENCOUNTER — Other Ambulatory Visit: Payer: Self-pay | Admitting: Family Medicine

## 2021-05-15 DIAGNOSIS — G8929 Other chronic pain: Secondary | ICD-10-CM

## 2021-05-16 ENCOUNTER — Other Ambulatory Visit: Payer: Self-pay | Admitting: Family Medicine

## 2021-05-16 DIAGNOSIS — G8929 Other chronic pain: Secondary | ICD-10-CM

## 2021-05-16 DIAGNOSIS — M545 Low back pain, unspecified: Secondary | ICD-10-CM

## 2021-06-03 ENCOUNTER — Encounter: Payer: Self-pay | Admitting: Family Medicine

## 2021-06-03 ENCOUNTER — Ambulatory Visit (INDEPENDENT_AMBULATORY_CARE_PROVIDER_SITE_OTHER): Payer: Medicare Other | Admitting: Family Medicine

## 2021-06-03 ENCOUNTER — Other Ambulatory Visit: Payer: Self-pay

## 2021-06-03 DIAGNOSIS — M545 Low back pain, unspecified: Secondary | ICD-10-CM

## 2021-06-03 DIAGNOSIS — K219 Gastro-esophageal reflux disease without esophagitis: Secondary | ICD-10-CM

## 2021-06-03 DIAGNOSIS — E1140 Type 2 diabetes mellitus with diabetic neuropathy, unspecified: Secondary | ICD-10-CM

## 2021-06-03 DIAGNOSIS — E1165 Type 2 diabetes mellitus with hyperglycemia: Secondary | ICD-10-CM

## 2021-06-03 DIAGNOSIS — Z794 Long term (current) use of insulin: Secondary | ICD-10-CM

## 2021-06-03 DIAGNOSIS — I10 Essential (primary) hypertension: Secondary | ICD-10-CM

## 2021-06-03 DIAGNOSIS — J302 Other seasonal allergic rhinitis: Secondary | ICD-10-CM | POA: Diagnosis not present

## 2021-06-03 DIAGNOSIS — G8929 Other chronic pain: Secondary | ICD-10-CM

## 2021-06-03 MED ORDER — OMEPRAZOLE 40 MG PO CPDR: 40.0000 mg | DELAYED_RELEASE_CAPSULE | Freq: Every day | ORAL | 1 refills | Status: DC

## 2021-06-03 MED ORDER — EMPAGLIFLOZIN 10 MG PO TABS: 10.0000 mg | ORAL_TABLET | Freq: Every day | ORAL | 3 refills | Status: DC

## 2021-06-03 MED ORDER — TRAMADOL HCL 50 MG PO TABS: 50.0000 mg | ORAL_TABLET | Freq: Every day | ORAL | 0 refills | Status: DC | PRN

## 2021-06-03 MED ORDER — GABAPENTIN 300 MG PO CAPS: ORAL_CAPSULE | ORAL | 3 refills | Status: DC

## 2021-06-03 MED ORDER — LANTUS SOLOSTAR 100 UNIT/ML ~~LOC~~ SOPN: PEN_INJECTOR | SUBCUTANEOUS | 3 refills | Status: DC

## 2021-06-03 MED ORDER — CETIRIZINE HCL 10 MG PO TABS: ORAL_TABLET | ORAL | 1 refills | Status: DC

## 2021-06-03 NOTE — Patient Instructions (Signed)
I only made one change.  Increase your insulin from 20 units to 25 units every day.   ?I sent in a new prescription for insulin and I refilled the five medicines you asked.   ?I don't think you have a problem with low blood sugars during your Ramadan fasting, please check your blood sugar and eat if it is low.   ?I hope you have safe travels and enjoy the fellowship during the holy month of Ramadan.   ?Please see [place] again when you return from [place]. ?

## 2021-06-04 ENCOUNTER — Encounter: Payer: Self-pay | Admitting: Family Medicine

## 2021-06-04 NOTE — Assessment & Plan Note (Signed)
Poor control.  I am hesitant to make major changes given his upcoming foreign travel and the daytime fasting observed during Ramadan.  And with an A1C=8.8, we have plenty of room to improve.  I increased his lantus dose from 20 units to 25 units daily.  All other meds unchanged. ?

## 2021-06-04 NOTE — Progress Notes (Signed)
? ? ?  SUBJECTIVE:  ? ?CHIEF COMPLAINT / HPI:  ? ?Several issues: ?BP reading was "high" yesterday.  Compliant with meds.  Today's reading fine.  No chest pain or DOE. ?BS reading was high yesterday.  States compliant with meds.  Last A1C was 04/17/21=8.8%.  I cannot see that any adjustments were made at that time in his diabetic meds.  He does check his BS at thome. ?Traveling to [place] for two months to observe Ramadan with family and friends.  Needs long refills on several meds.  He brought in all his meds and I was able to do a good med rec.  I refilled the needed meds with a three month supply ? ? ?OBJECTIVE:  ? ?BP 130/70   Pulse 68   Ht 5\' 9"  (1.753 m)   Wt 156 lb 6.4 oz (70.9 kg)   SpO2 96%   BMI 23.10 kg/m?   ?VS noted ?Lungs clear ?C[place] rRR without m or g ?Ext no edema. ? ?ASSESSMENT/PLAN:  ? ?Essential hypertension ?Well controled.  No change in meds. ? ?Type 2 diabetes mellitus with diabetic neuropathy, unspecified ([place]) ?Poor control.  I am hesitant to make major changes given his upcoming foreign travel and the daytime fasting observed during Ramadan.  And with an A1C=8.8, we have plenty of room to improve.  I increased his lantus dose from 20 units to 25 units daily.  All other meds unchanged. ?  ? ? ?Zenia Resides, MD ?[place]  ?

## 2021-06-04 NOTE — Assessment & Plan Note (Signed)
Well controled.  No change in meds. ?

## 2021-07-19 ENCOUNTER — Other Ambulatory Visit: Payer: Self-pay | Admitting: Family Medicine

## 2021-07-25 ENCOUNTER — Encounter: Payer: Self-pay | Admitting: Family Medicine

## 2021-07-25 ENCOUNTER — Ambulatory Visit (INDEPENDENT_AMBULATORY_CARE_PROVIDER_SITE_OTHER): Payer: Medicare Other | Admitting: Family Medicine

## 2021-07-25 ENCOUNTER — Telehealth: Payer: Self-pay | Admitting: Family Medicine

## 2021-07-25 VITALS — BP 120/80 | HR 80 | Wt 152.6 lb

## 2021-07-25 DIAGNOSIS — M545 Low back pain, unspecified: Secondary | ICD-10-CM | POA: Diagnosis not present

## 2021-07-25 DIAGNOSIS — R609 Edema, unspecified: Secondary | ICD-10-CM

## 2021-07-25 MED ORDER — BACLOFEN 10 MG PO TABS: 10.0000 mg | ORAL_TABLET | Freq: Three times a day (TID) | ORAL | 0 refills | Status: DC

## 2021-07-25 MED ORDER — IBUPROFEN 400 MG PO TABS: 400.0000 mg | ORAL_TABLET | Freq: Four times a day (QID) | ORAL | 0 refills | Status: DC | PRN

## 2021-07-25 NOTE — Telephone Encounter (Signed)
Called to discuss minimizing use of ibuprofen due to history of peptic ulcer disease.  Patient voiced understanding.  Would not use more than 10 days or after back pain is resolved. ? ?Lavonda Jumbo, DO ?07/25/2021, 5:17 PM ?PGY-3, [facility] ? ?

## 2021-07-25 NOTE — Progress Notes (Signed)
? ? ?  SUBJECTIVE:  ? ?CHIEF COMPLAINT / HPI:  ? ?Ankle swelling, low back pain ?Symptoms started last night. He has never had this problem in the past. 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 He denies fall, fever, saddle anesthesia, radiation of back pain, shortness of breath, and chest pain.  ? ?PERTINENT  PMH / PSH: HTN, T2DM, hx of PUD ? ?OBJECTIVE:  ? ?BP 120/80   Pulse 80   Wt 152 lb 9.6 oz (69.2 kg)   SpO2 99%   BMI 22.54 kg/m?   ?Physical Exam ?Vitals reviewed.  ?Constitutional:   ?   General: He is not in acute distress. ?   Appearance: He is not ill-appearing, toxic-appearing or diaphoretic.  ?Musculoskeletal:     ?   General: Swelling present.  ?   Right lower leg: Edema present.  ?   Left lower leg: Edema present.  ?   Comments:  ?- Inspection: No gross deformity, no swelling, erythema, or ecchymosis b/l ?- Palpation: TTP at lower lumbar region.  ?- ROM: Normal range of motion on Flexion, extension, abduction, internal and external rotation b/l ?- Strength: Normal strength in all fields b/l ?- Neuro/vasc: NV intact distally b/l ?- Special Tests: Negative FABER and FADIR b/l. ?  ?Neurological:  ?   Mental Status: He is alert.  ? ? ? ?ASSESSMENT/PLAN:  ?1. Dependent edema ?Acute. 1 day. Localized to ankles. Non pitting. No CHF symptoms. Recent labs wnl. Weight stable. Hx of standing in one place for an extended amount of time. Suggested compression stockings and follow up if worsens or develops new symptoms.  ? ?2. Acute bilateral low back pain without sciatica ?1 day. Moderately tender along the lower back. No red flags. Hip exam wnl. Likely spasm due to overuse for the reasons above. Will give baclofen for relief in addition to a short course of ibuprofen. Patient notified to keep this minimal and for the least amount of days possible.  ? ? ?Felesia Stahlecker Autry-Lott, DO ?[facility]  ?

## 2021-07-25 NOTE — Patient Instructions (Signed)
Your ankle swelling is likely due to long periods of standing.  I would advise you to get compression stockings and wear them to work. ?You likely have a lower back spasm due to long periods of standing.  I would advise heat and massage to the area in addition to the anti-inflammatory and muscle relaxant.  If you developed other symptoms like fever, urinary symptoms, numbness or tingling in your legs or feet please let us know and follow-up.  Otherwise you can follow-up if symptoms do not improve over the next week. ?

## 2021-08-06 ENCOUNTER — Other Ambulatory Visit: Payer: Self-pay | Admitting: *Deleted

## 2021-08-06 DIAGNOSIS — E1165 Type 2 diabetes mellitus with hyperglycemia: Secondary | ICD-10-CM

## 2021-08-06 MED ORDER — ATORVASTATIN CALCIUM 40 MG PO TABS: ORAL_TABLET | ORAL | 3 refills | Status: DC

## 2021-08-20 ENCOUNTER — Other Ambulatory Visit: Payer: Self-pay | Admitting: Family Medicine

## 2021-08-28 ENCOUNTER — Other Ambulatory Visit: Payer: Self-pay | Admitting: Family Medicine

## 2021-08-28 ENCOUNTER — Ambulatory Visit (INDEPENDENT_AMBULATORY_CARE_PROVIDER_SITE_OTHER): Payer: Medicare Other | Admitting: Family Medicine

## 2021-08-28 ENCOUNTER — Encounter: Payer: Self-pay | Admitting: Family Medicine

## 2021-08-28 VITALS — BP 128/79 | HR 76 | Ht 69.0 in | Wt 150.0 lb

## 2021-08-28 DIAGNOSIS — G8929 Other chronic pain: Secondary | ICD-10-CM

## 2021-08-28 DIAGNOSIS — N529 Male erectile dysfunction, unspecified: Secondary | ICD-10-CM

## 2021-08-28 DIAGNOSIS — N401 Enlarged prostate with lower urinary tract symptoms: Secondary | ICD-10-CM | POA: Diagnosis not present

## 2021-08-28 DIAGNOSIS — Z794 Long term (current) use of insulin: Secondary | ICD-10-CM

## 2021-08-28 DIAGNOSIS — J301 Allergic rhinitis due to pollen: Secondary | ICD-10-CM

## 2021-08-28 DIAGNOSIS — E1165 Type 2 diabetes mellitus with hyperglycemia: Secondary | ICD-10-CM

## 2021-08-28 DIAGNOSIS — K219 Gastro-esophageal reflux disease without esophagitis: Secondary | ICD-10-CM | POA: Diagnosis not present

## 2021-08-28 DIAGNOSIS — R3912 Poor urinary stream: Secondary | ICD-10-CM

## 2021-08-28 DIAGNOSIS — Z298 Encounter for other specified prophylactic measures: Secondary | ICD-10-CM

## 2021-08-28 DIAGNOSIS — E1140 Type 2 diabetes mellitus with diabetic neuropathy, unspecified: Secondary | ICD-10-CM | POA: Diagnosis not present

## 2021-08-28 DIAGNOSIS — J302 Other seasonal allergic rhinitis: Secondary | ICD-10-CM

## 2021-08-28 DIAGNOSIS — Z760 Encounter for issue of repeat prescription: Secondary | ICD-10-CM

## 2021-08-28 DIAGNOSIS — M545 Low back pain, unspecified: Secondary | ICD-10-CM

## 2021-08-28 DIAGNOSIS — E1169 Type 2 diabetes mellitus with other specified complication: Secondary | ICD-10-CM

## 2021-08-28 LAB — POCT GLYCOSYLATED HEMOGLOBIN (HGB A1C): HbA1c, POC (controlled diabetic range): 8.7 % — AB (ref 0.0–7.0)

## 2021-08-28 MED ORDER — GABAPENTIN 300 MG PO CAPS: 300.0000 mg | ORAL_CAPSULE | Freq: Two times a day (BID) | ORAL | 0 refills | Status: DC

## 2021-08-28 MED ORDER — TAMSULOSIN HCL 0.4 MG PO CAPS: 0.4000 mg | ORAL_CAPSULE | Freq: Every day | ORAL | 0 refills | Status: DC

## 2021-08-28 MED ORDER — LOSARTAN POTASSIUM 50 MG PO TABS: ORAL_TABLET | ORAL | 0 refills | Status: DC

## 2021-08-28 MED ORDER — LANTUS SOLOSTAR 100 UNIT/ML ~~LOC~~ SOPN: 28.0000 [IU] | PEN_INJECTOR | Freq: Every day | SUBCUTANEOUS | 0 refills | Status: DC

## 2021-08-28 MED ORDER — AMLODIPINE BESYLATE 10 MG PO TABS: ORAL_TABLET | ORAL | 3 refills | Status: DC

## 2021-08-28 MED ORDER — MEFLOQUINE HCL 250 MG PO TABS: 250.0000 mg | ORAL_TABLET | ORAL | 0 refills | Status: AC

## 2021-08-28 MED ORDER — OMEPRAZOLE 40 MG PO CPDR: 40.0000 mg | DELAYED_RELEASE_CAPSULE | Freq: Every day | ORAL | 1 refills | Status: DC

## 2021-08-28 MED ORDER — EMPAGLIFLOZIN 10 MG PO TABS
10.0000 mg | ORAL_TABLET | Freq: Every day | ORAL | 3 refills | Status: DC
Start: 2021-08-28 — End: 2022-10-08

## 2021-08-28 MED ORDER — ATORVASTATIN CALCIUM 40 MG PO TABS: ORAL_TABLET | ORAL | 3 refills | Status: DC

## 2021-08-28 MED ORDER — TRAMADOL HCL 50 MG PO TABS: 50.0000 mg | ORAL_TABLET | Freq: Every day | ORAL | 0 refills | Status: DC | PRN

## 2021-08-28 MED ORDER — BD PEN NEEDLE MINI U/F 31G X 5 MM MISC: 0 refills | Status: DC

## 2021-08-28 MED ORDER — FLUTICASONE PROPIONATE 50 MCG/ACT NA SUSP: 2.0000 | Freq: Every day | NASAL | 0 refills | Status: DC

## 2021-08-28 MED ORDER — SILDENAFIL CITRATE 50 MG PO TABS: 50.0000 mg | ORAL_TABLET | Freq: Every day | ORAL | 0 refills | Status: DC | PRN

## 2021-08-28 MED ORDER — METFORMIN HCL 500 MG PO TABS: 1000.0000 mg | ORAL_TABLET | Freq: Two times a day (BID) | ORAL | 0 refills | Status: DC

## 2021-08-28 MED ORDER — CETIRIZINE HCL 10 MG PO TABS: ORAL_TABLET | ORAL | 1 refills | Status: DC

## 2021-08-28 NOTE — Progress Notes (Addendum)
     SUBJECTIVE:   CHIEF COMPLAINT / HPI:   Mitchell Page is a 66 y.o. male presents for follow up   Med refill Pt is going to [place] next week for a minimum of 3 months. He may stay longer as his mother is unwell. He is requesting a 3 month supply of all his medications including tramadol.  Travel to [place] Pt is travelling to a malaria endemic area, has not been prescribed malaria prophylaxis yet. He did take mefloquine in 2015 last time he travel to [place] and tolerated this well.   Back pain  Pt has worsening of his chronic back pain. He would like a 90 day supply of his tramadol. He has tried ibuprofen for the back pain without much improvement.  Diabetes Patient's current diabetic medications include jardiace, metformin and lantus 25 units. Tolerating well without side effects.  Patient endorses compliance with these medications. Patient's last A1c was  Lab Results  Component Value Date   HGBA1C 8.7 (A) 08/28/2021   HGBA1C 8.8 (A) 04/17/2021   HGBA1C 9.1 (A) 08/08/2020  Denies abdominal pain, blurred vision, polyuria, polydipsia, hypoglycemia. Patient states they understand that diet and exercise can help with her diabetes.    Last Microalbumin, LDL, Creatinine: Lab Results  Component Value Date   MICROALBUR 0.50 06/01/2009   LDLCALC 34 04/17/2021   CREATININE 0.87 04/17/2021    Flowsheet Row Office Visit from 08/28/2021 in [facility]  PHQ-9 Total Score 0       PERTINENT  PMH / PSH: T2DM, HTN   OBJECTIVE:   BP 128/79   Pulse 76   Ht 5\' 9"  (1.753 m)   Wt 150 lb (68 kg)   SpO2 99%   BMI 22.15 kg/m    General: Alert, no acute distress Cardio:  well perfused Pulm: normal work of breathing Neuro: Cranial nerves grossly intact   ASSESSMENT/PLAN:   Medication refill Refilled all medications for 90 days.  Erectile dysfunction Provided pt 30 day refill of viagra.   Back pain Chronic. No red flags. PDMP reviewed, last refill for  Tramadol was on 06/04/21. Refill appropriate. After precepting with Dr 06/06/21 refilled 90 tablets of tramadol. Pt should follow up in clinic after his travels for refill of Tramadol.   Type 2 diabetes mellitus with diabetic neuropathy, unspecified ([facility]) A1c 8.7. Not much change from previously at 8.8. Increased lantus to 28 units. I do not want to make large changes to his regimin givne his is travelling next week and will be out of the country for 3 months. Continue jardiance and metformin. Also pt is at risk of worsening hyperglycemia due to dehydration due to the weather in [place] and also he is on SGLT2 inhibitor. Recommended he stays adequately hydrated on his travels. Follow up in 3 months after he returns from [place].  Need for malaria prophylaxis Prescribed pt Mefloquine. Ideally he should have started this 3-4 weeks prior to travel. Recommended he starts this today. Continue once weekly and until 4 weeks after he returns from [place]. Provided pt with 20 tablets. Malaria precautions given to pt.     [place], MD PGY-3 [facility]

## 2021-08-28 NOTE — Patient Instructions (Addendum)
Thank you for coming to see me today. It was a pleasure. I have sent in 90 tablets of all your medications including tramadol.  Increase lantus to 28 units.  See leaflet on malaria  Take mefloquine once weekly from today until 4 weeks after you come back. Side effects include nausea and vomiting  Please follow-up with [place] as needed   If you have any questions or concerns, please do not hesitate to call the office at [phone].  Best wishes,   Dr Allena Katz

## 2021-08-29 ENCOUNTER — Telehealth: Payer: Self-pay

## 2021-08-29 ENCOUNTER — Telehealth: Payer: Self-pay | Admitting: Family Medicine

## 2021-08-29 DIAGNOSIS — Z2989 Encounter for other specified prophylactic measures: Secondary | ICD-10-CM | POA: Insufficient documentation

## 2021-08-29 DIAGNOSIS — Z298 Encounter for other specified prophylactic measures: Secondary | ICD-10-CM | POA: Insufficient documentation

## 2021-08-29 NOTE — Assessment & Plan Note (Signed)
Prescribed pt Mefloquine. Ideally he should have started this 3-4 weeks prior to travel. Recommended he starts this today. Continue once weekly and until 4 weeks after he returns from [place]. Provided pt with 20 tablets. Malaria precautions given to pt.

## 2021-08-29 NOTE — Assessment & Plan Note (Signed)
A1c 8.7. Not much change from previously at 8.8. Increased lantus to 28 units. I do not want to make large changes to his regimin givne his is travelling next week and will be out of the country for 3 months. Continue jardiance and metformin. Also pt is at risk of worsening hyperglycemia due to dehydration due to the weather in [place] and also he is on SGLT2 inhibitor. Recommended he stays adequately hydrated on his travels. Follow up in 3 months after he returns from [place].

## 2021-08-29 NOTE — Telephone Encounter (Signed)
Patient was seen yesterday on June 15th stated he needs 3 month supply of his medicine going out of [place].  Name of medicine is Tramadol 50 mg and Lantus insulin injection pen.  [facility] [address] in [place].  Any questions please call patient any questions.  [phone]

## 2021-08-29 NOTE — Assessment & Plan Note (Addendum)
Chronic. No red flags. PDMP reviewed, last refill for Tramadol was on 06/04/21. Refill appropriate. After precepting with Dr [place] refilled 90 tablets of tramadol. Pt should follow up in clinic after his travels for refill of Tramadol.

## 2021-08-29 NOTE — Assessment & Plan Note (Signed)
Refilled all medications for 90 days.

## 2021-08-29 NOTE — Assessment & Plan Note (Signed)
Provided pt 30 day refill of viagra.

## 2021-08-29 NOTE — Telephone Encounter (Addendum)
Patient calls nurse line regarding issues with picking up Lantus rx. Pharmacy is unable to fill due to prescription having two sets of directions. Current rx: Inject 28 Units into the skin daily. INJECT 25 Units at the same time each day.   Please send new prescription with the amount of units patient is supposed to be taking daily.   Veronda Prude, RN

## 2021-08-30 ENCOUNTER — Other Ambulatory Visit: Payer: Self-pay | Admitting: Family Medicine

## 2021-08-30 ENCOUNTER — Telehealth: Payer: Self-pay | Admitting: Family Medicine

## 2021-08-30 DIAGNOSIS — E1140 Type 2 diabetes mellitus with diabetic neuropathy, unspecified: Secondary | ICD-10-CM

## 2021-08-30 DIAGNOSIS — Z794 Long term (current) use of insulin: Secondary | ICD-10-CM

## 2021-08-30 MED ORDER — LANTUS SOLOSTAR 100 UNIT/ML ~~LOC~~ SOPN: 28.0000 [IU] | PEN_INJECTOR | Freq: Every day | SUBCUTANEOUS | 0 refills | Status: DC

## 2021-08-30 NOTE — Telephone Encounter (Signed)
Sent in lantus 28 units for 90 day supply

## 2021-08-30 NOTE — Telephone Encounter (Signed)
Patient walked in to say the pharmacy said the doctor needs to correct the directions on the prescription (Lantus) Should it be 28 units or 25 units  Leaving for [place] on Sunday  Please call patient [phone]

## 2021-08-30 NOTE — Telephone Encounter (Signed)
Sent in correction prescription thanks

## 2021-09-02 ENCOUNTER — Telehealth: Payer: Self-pay | Admitting: *Deleted

## 2021-09-02 NOTE — Telephone Encounter (Signed)
Opened in error.Gamble Enderle Zimmerman Rumple, CMA  

## 2021-12-30 ENCOUNTER — Other Ambulatory Visit: Payer: Self-pay

## 2021-12-30 DIAGNOSIS — M545 Low back pain, unspecified: Secondary | ICD-10-CM

## 2021-12-31 MED ORDER — TRAMADOL HCL 50 MG PO TABS: 50.0000 mg | ORAL_TABLET | Freq: Every day | ORAL | 0 refills | Status: DC | PRN

## 2022-01-20 ENCOUNTER — Other Ambulatory Visit: Payer: Self-pay

## 2022-01-20 DIAGNOSIS — E1165 Type 2 diabetes mellitus with hyperglycemia: Secondary | ICD-10-CM

## 2022-01-20 DIAGNOSIS — E1140 Type 2 diabetes mellitus with diabetic neuropathy, unspecified: Secondary | ICD-10-CM

## 2022-01-20 MED ORDER — LANTUS SOLOSTAR 100 UNIT/ML ~~LOC~~ SOPN: 28.0000 [IU] | PEN_INJECTOR | Freq: Every day | SUBCUTANEOUS | 0 refills | Status: DC

## 2022-04-16 ENCOUNTER — Other Ambulatory Visit: Payer: Self-pay

## 2022-04-17 ENCOUNTER — Other Ambulatory Visit: Payer: Self-pay

## 2022-04-18 MED ORDER — METFORMIN HCL 500 MG PO TABS: 1000.0000 mg | ORAL_TABLET | Freq: Two times a day (BID) | ORAL | 0 refills | Status: DC

## 2022-04-21 ENCOUNTER — Other Ambulatory Visit: Payer: Self-pay

## 2022-04-21 DIAGNOSIS — N401 Enlarged prostate with lower urinary tract symptoms: Secondary | ICD-10-CM

## 2022-04-22 MED ORDER — TAMSULOSIN HCL 0.4 MG PO CAPS: 0.4000 mg | ORAL_CAPSULE | Freq: Every day | ORAL | 0 refills | Status: DC

## 2022-05-15 ENCOUNTER — Ambulatory Visit (HOSPITAL_COMMUNITY)
Admission: EM | Admit: 2022-05-15 | Discharge: 2022-05-15 | Disposition: A | Discharge status: Home / Self Care | Payer: 59 | Attending: Sports Medicine | Admitting: Sports Medicine

## 2022-05-15 ENCOUNTER — Encounter (HOSPITAL_COMMUNITY): Payer: Self-pay | Admitting: *Deleted

## 2022-05-15 DIAGNOSIS — M79604 Pain in right leg: Secondary | ICD-10-CM | POA: Diagnosis present

## 2022-05-15 DIAGNOSIS — M79605 Pain in left leg: Secondary | ICD-10-CM | POA: Insufficient documentation

## 2022-05-15 DIAGNOSIS — E1140 Type 2 diabetes mellitus with diabetic neuropathy, unspecified: Secondary | ICD-10-CM | POA: Diagnosis not present

## 2022-05-15 LAB — BASIC METABOLIC PANEL
Anion gap: 12 (ref 5–15)
BUN: 14 mg/dL (ref 8–23)
CO2: 24 mmol/L (ref 22–32)
Calcium: 10 mg/dL (ref 8.9–10.3)
Chloride: 98 mmol/L (ref 98–111)
Creatinine, Ser: 0.81 mg/dL (ref 0.61–1.24)
GFR, Estimated: >60 mL/min (ref >60)
Glucose, Bld: 254 mg/dL — ABNORMAL HIGH (ref 70–99)
Potassium: 4.2 mmol/L (ref 3.5–5.1)
Sodium: 134 mmol/L — ABNORMAL LOW (ref 135–145)

## 2022-05-15 LAB — CBC WITH DIFFERENTIAL/PLATELET
Abs Immature Granulocytes: 0.01 10*3/uL (ref 0.00–0.07)
Basophils Absolute: 0 10*3/uL (ref 0.0–0.1)
Basophils Relative: 1 %
Eosinophils Absolute: 0.2 10*3/uL (ref 0.0–0.5)
Eosinophils Relative: 3 %
HCT: 41.9 % (ref 39.0–52.0)
Hemoglobin: 15.6 g/dL (ref 13.0–17.0)
Immature Granulocytes: 0 %
Lymphocytes Relative: 40 %
Lymphs Abs: 1.7 10*3/uL (ref 0.7–4.0)
MCH: 28.8 pg (ref 26.0–34.0)
MCHC: 37.2 g/dL — ABNORMAL HIGH (ref 30.0–36.0)
MCV: 77.4 fL — ABNORMAL LOW (ref 80.0–100.0)
Monocytes Absolute: 0.6 10*3/uL (ref 0.1–1.0)
Monocytes Relative: 14 %
Neutro Abs: 1.8 10*3/uL (ref 1.7–7.7)
Neutrophils Relative %: 42 %
Platelets: 201 10*3/uL (ref 150–400)
RBC: 5.41 MIL/uL (ref 4.22–5.81)
RDW: 14.4 % (ref 11.5–15.5)
WBC: 4.4 10*3/uL (ref 4.0–10.5)
nRBC: 0 % (ref 0.0–0.2)

## 2022-05-15 LAB — VITAMIN B12: Vitamin B-12: 438 pg/mL (ref 180–914)

## 2022-05-15 MED ORDER — BACLOFEN 10 MG PO TABS: 10.0000 mg | ORAL_TABLET | Freq: Three times a day (TID) | ORAL | 0 refills | Status: DC

## 2022-05-15 MED ORDER — KETOROLAC TROMETHAMINE 30 MG/ML IJ SOLN
INTRAMUSCULAR | Status: AC
  Filled 2022-05-15: qty 1

## 2022-05-15 MED ORDER — KETOROLAC TROMETHAMINE 30 MG/ML IJ SOLN
30.0000 mg | Freq: Once | INTRAMUSCULAR | Status: AC
  Administered 2022-05-15: 30 mg via INTRAMUSCULAR

## 2022-05-15 NOTE — ED Provider Notes (Signed)
[place]    CSN: [id number] Arrival date & time: 05/15/22  0803      History   Chief Complaint Chief Complaint  Patient presents with   Leg Pain    HPI Mitchell Page is a 67 y.o. male.   Mitchell Page presents today with chief complaint of bilateral leg cramps that began on Tuesday.  He reports he returned from [place] over the weekend.  He had one 5-hour flight then an 8-hour flight than a bus ride from [place] back to [place].  He reports he was feeling well throughout his trip.  He reports he has had similar leg pain to this in the past but only down his right leg, he subsequently underwent back surgery which resolved this.  He reports he has not been very hydrated since he returned back from [place].  He has been compliant with his medications while he has been gone.  He was unable to get into his primary care office today so he presented to the urgent care.  He reports he feels like he is having cramps all the way from his buttocks down to his feet and both of his legs.  He denies any numbness or tingling, weakness or loss of bowel or bladder control.  He says he feels better when he is up and moving as opposed to sitting still.  He has been doing some stretching with only temporary relief.  He tried Tylenol, ibuprofen and tramadol with only temporary relief.  He denies any fevers, redness, swelling, chest pain or shortness of breath.   Leg Pain   Past Medical History:  Diagnosis Date   Allergic rhinitis    Chronic headache    Cough 04/21/2017   Diabetes mellitus    GERD (gastroesophageal reflux disease)    Hyperlipidemia    Hypertension    Illiteracy and low-level literacy    Unclear of his literacy  in his native tongue, however, he is unable to read in English   Lower back pain    w/ R LE sciatica s/p laminectomy in 2008. MRI- 04/2008    Patient Active Problem List   Diagnosis Date Noted   Need for malaria prophylaxis 08/29/2021   Medication refill  08/26/2017   BPH (benign prostatic hyperplasia) 01/19/2017   Healthcare maintenance 11/09/2013   Erectile dysfunction 12/10/2012   PUD (peptic ulcer disease) 06/19/2010   Headache 09/13/2009   Allergic rhinitis 09/11/2009   Essential hypertension 06/01/2009   Back pain 09/15/2008   Type 2 diabetes mellitus with diabetic neuropathy, unspecified ([place]) 09/16/1999    Past Surgical History:  Procedure Laterality Date   LUMBAR LAMINECTOMY  2008   l4-5   Repair of left arm fracture         Home Medications    Prior to Admission medications   Medication Sig Start Date End Date Taking? Authorizing Provider  Accu-Chek Softclix Lancets lancets Use as instructed 05/14/20  Yes Cresenzo, Angelyn Punt, MD  amLODipine (NORVASC) 10 MG tablet TAKE 1 TABLET(10 MG) BY MOUTH AT BEDTIME 08/28/21  Yes Lattie Haw, MD  atorvastatin (LIPITOR) 40 MG tablet TAKE 1 TABLET(40 MG) BY MOUTH DAILY 08/28/21  Yes Lattie Haw, MD  Blood Glucose Monitoring Suppl (ACCU-CHEK GUIDE) w/Device KIT 1 kit by Does not apply route daily. Use to test blood glucose up to 3 times daily. Dx E11.8, E11.65, Z79.4 05/14/20  Yes Concepcion Living, MD  Blood Pressure Monitoring (BLOOD PRESSURE CUFF) MISC 1 Units by Does not apply  route daily. 04/12/20  Yes Cresenzo, Angelyn Punt, MD  empagliflozin (JARDIANCE) 10 MG TABS tablet Take 1 tablet (10 mg total) by mouth daily. 08/28/21  Yes Lattie Haw, MD  insulin glargine (LANTUS SOLOSTAR) 100 UNIT/ML Solostar Pen Inject 28 Units into the skin daily. 01/20/22 05/15/22 Yes Dameron, Luna Fuse, DO  Insulin Pen Needle (B-D UF III MINI PEN NEEDLES) 31G X 5 MM MISC USE ONCE DAILY AS DIRECTED FOR INSULIN INJECTION 08/28/21  Yes Lattie Haw, MD  Lancets Misc. (ACCU-CHEK SOFTCLIX LANCET DEV) KIT Use as needed to check blood sugars 05/14/20  Yes Cresenzo, Angelyn Punt, MD  losartan (COZAAR) 50 MG tablet TAKE 1 TABLET(50 MG) BY MOUTH DAILY 08/28/21  Yes Lattie Haw, MD  metFORMIN (GLUCOPHAGE) 500 MG tablet Take 2 tablets  (1,000 mg total) by mouth 2 (two) times daily with a meal. 04/18/22 07/17/22 Yes Dameron, Luna Fuse, DO  omeprazole (PRILOSEC) 40 MG capsule Take 1 capsule (40 mg total) by mouth daily. 08/28/21  Yes Lattie Haw, MD  tamsulosin (FLOMAX) 0.4 MG CAPS capsule Take 1 capsule (0.4 mg total) by mouth daily. 04/22/22  Yes Dameron, Luna Fuse, DO  traMADol (ULTRAM) 50 MG tablet Take 1 tablet (50 mg total) by mouth daily as needed. 12/31/21  Yes Dameron, Luna Fuse, DO  baclofen (LIORESAL) 10 MG tablet Take 1 tablet (10 mg total) by mouth 3 (three) times daily. 05/15/22   Elmore Guise, DO  butalbital-acetaminophen-caffeine (FIORICET) [phone] MG tablet  04/23/20   [provider]  cetirizine (ZYRTEC) 10 MG tablet TAKE 1 TABLET(10 MG) BY MOUTH DAILY for allergies 08/28/21   Lattie Haw, MD  diclofenac Sodium (VOLTAREN) 1 % GEL Apply 4 g topically 4 (four) times daily as needed. Patient not taking: Reported on 07/25/2021 01/07/21   Lilland, Alana, DO  diphenoxylate-atropine (LOMOTIL) 2.5-0.025 MG tablet Take 1 tablet by mouth 4 (four) times daily as needed for diarrhea or loose stools. 04/13/21   Scot Jun, NP  fluticasone (FLONASE) 50 MCG/ACT nasal spray Place 2 sprays into both nostrils daily. SHAKE LIQUID AND USE 2 SPRAYS IN [facility] NOSTRIL DAILY 08/28/21 11/26/21  Lattie Haw, MD  gabapentin (NEURONTIN) 300 MG capsule Take 1 capsule (300 mg total) by mouth 2 (two) times daily. TAKE 1 CAPSULE(300 MG) BY MOUTH TWICE DAILY 08/28/21 11/26/21  Lattie Haw, MD  glucose blood (ACCU-CHEK GUIDE) test strip Use to test blood glucose up to 3 times daily. Dx E11.8, E11.65, Z79.4 Patient not taking: Reported on 07/25/2021 05/14/20   Concepcion Living, MD  Multiple Vitamins-Minerals (CENTRUM MEN) TABS Take 1 tablet by mouth daily. Patient not taking: Reported on 07/25/2021    [provider]  Omega-3 Fatty Acids (FISH OIL PO) Take by mouth. Patient not taking: Reported on 07/25/2021    [provider]   sildenafil (VIAGRA) 50 MG tablet Take 1 tablet (50 mg total) by mouth daily as needed for erectile dysfunction. 08/28/21 09/27/21  Lattie Haw, MD  sodium chloride (OCEAN) 0.65 % SOLN nasal spray Place 1 spray into both nostrils as needed for congestion. Patient not taking: Reported on 07/25/2021 07/24/15   Elnora Morrison, MD    Family History Family History  Problem Relation Age of Onset   Heart disease Father     Social History Social History   Tobacco Use   Smoking status: Never   Smokeless tobacco: Never  Vaping Use   Vaping Use: Never used  Substance Use Topics   Alcohol use: No    Alcohol/week: 0.0 standard drinks of  alcohol   Drug use: No     Allergies   Patient has no known allergies.   Review of Systems Review of Systems As listed above in HPI  Physical Exam Triage Vital Signs ED Triage Vitals  Enc Vitals Group     BP 05/15/22 0824 (!) 147/81     Pulse Rate 05/15/22 0824 80     Resp 05/15/22 0824 18     Temp 05/15/22 0824 97.9 F (36.6 C)     Temp Source 05/15/22 0824 Oral     SpO2 05/15/22 0824 96 %     Weight --      Height --      Head Circumference --      Peak Flow --      Pain Score 05/15/22 0820 10     Pain Loc --      Pain Edu? --      Excl. in [place]? --    No data found.  Updated Vital Signs BP (!) 147/81 (BP Location: Left Arm)   Pulse 80   Temp 97.9 F (36.6 C) (Oral)   Resp 18   SpO2 96%   Physical Exam Vitals reviewed.  Constitutional:      General: He is not in acute distress.    Appearance: Normal appearance. He is not ill-appearing, toxic-appearing or diaphoretic.  Cardiovascular:     Rate and Rhythm: Normal rate.  Pulmonary:     Effort: Pulmonary effort is normal.  Neurological:     Mental Status: He is alert.   Bilateral legs:  No obvious deformity or asymmetry.  No ecchymosis, warmth, swelling or edema.  He has tenderness to palpation the posterior calf and thigh bilaterally.  He has full range of motion at the ankle,  knee and hip.  Strength 5/5 bilaterally the ankle knee and hip.  Sensation intact to light touch.  Distal pulse, dorsalis pedis 2+ bilaterally.  Normal gait. Patient does appear uncomfortable changing position frequently seated in the chair during my exam.   UC Treatments / Results  Labs (all labs ordered are listed, but only abnormal results are displayed) Labs Reviewed  BASIC METABOLIC PANEL  CBC WITH DIFFERENTIAL/PLATELET  VITAMIN B12  HEMOGLOBIN A1C    EKG   Radiology No results found.  Procedures Procedures (including critical care time)  Medications Ordered in UC Medications  ketorolac (TORADOL) 30 MG/ML injection 30 mg (has no administration in time range)    Initial Impression / Assessment and Plan / UC Course  I have reviewed the triage vital signs and the nursing notes.  Pertinent labs & imaging results that were available during my care of the patient were reviewed by me and considered in my medical decision making (see chart for details).     Bilateral leg pain, cramping Patient did return from a cross-country trip a couple days ago however secondary to the bilaterality of his pain, description of cramping, absence of erythema, warmth or swelling I have little suspicion for blood clot at this time.  He was counseled extensively on signs and symptoms of a DVT and to seek immediate attention at the ER should he develop any of these. I suspect his symptoms are likely secondary to his back versus some dehydration.  Today he received 30 mg IM injection of Toradol for his pain.  He was counseled not to take any other NSAID medications today.  I have also refilled for him a previous prescription of baclofen.  Labs are drawn, CBC,  BMP, A1c and vitamin B12 level to check for electrolyte abnormalities.  I recommend patient follow-up with his primary care provider on Friday or Monday to review these labs.  He verbalized understanding.  Should there be something more acute urgent  care staff will call him with these results.  We also discussed adequate hydration and meals.  He verbalized understanding. He may continue with regular activity, frequent walks and gentle stretching. Final Clinical Impressions(s) / UC Diagnoses   Final diagnoses:  Bilateral leg pain   Discharge Instructions   None    ED Prescriptions     Medication Sig Dispense Auth. Provider   baclofen (LIORESAL) 10 MG tablet Take 1 tablet (10 mg total) by mouth 3 (three) times daily. 30 each Elmore Guise, DO      PDMP not reviewed this encounter.   Elmore Guise, DO 05/15/22 [phone]

## 2022-05-15 NOTE — Discharge Instructions (Addendum)
For your leg cramps today I have refilled your baclofen, muscle relaxer, and recommend you work on adequate hydration with water.  You also got a shot today to help with your pain, Toradol 30 mg. Follow-up with your primary care provider in the next few days to review your labs. If you develop any swelling, redness or warmth in your legs seek medical attention your closest urgent care or ER.

## 2022-05-15 NOTE — ED Triage Notes (Signed)
Pt states he has had bilateral pain in the back of his leg since Tuesday. He did just get back from a long trip hours on planes and buses on Saturday. He has been taking tylenol and aleve, he did take a tramadol no relief.  States his legs feel better if he is moving them

## 2022-05-16 ENCOUNTER — Ambulatory Visit (INDEPENDENT_AMBULATORY_CARE_PROVIDER_SITE_OTHER): Payer: 59 | Admitting: Student

## 2022-05-16 VITALS — BP 138/74 | HR 95 | Ht 69.0 in | Wt 153.2 lb

## 2022-05-16 DIAGNOSIS — R252 Cramp and spasm: Secondary | ICD-10-CM

## 2022-05-16 DIAGNOSIS — I10 Essential (primary) hypertension: Secondary | ICD-10-CM | POA: Diagnosis not present

## 2022-05-16 LAB — HEMOGLOBIN A1C
Hgb A1c MFr Bld: 8.7 % — ABNORMAL HIGH (ref 4.8–5.6)
Mean Plasma Glucose: 203 mg/dL

## 2022-05-16 MED ORDER — IRON 325 (65 FE) MG PO TABS: 1.0000 | ORAL_TABLET | ORAL | 0 refills | Status: DC

## 2022-05-16 MED ORDER — NAPROXEN 500 MG PO TABS: 500.0000 mg | ORAL_TABLET | Freq: Two times a day (BID) | ORAL | 0 refills | Status: AC

## 2022-05-16 MED ORDER — LOSARTAN POTASSIUM 50 MG PO TABS: ORAL_TABLET | ORAL | 0 refills | Status: DC

## 2022-05-16 NOTE — Progress Notes (Signed)
    SUBJECTIVE:   CHIEF COMPLAINT / HPI: Bilateral leg cramping  Seen in urgent care yesterday for bilateral leg pain and cramping.  Did have recent cross-country trip where he returned from [place] over the weekend and had a 5-hour flight and 1/2-hour flight and bus ride from [place] to [place].  He got back from his trip on Saturday and started having pain on Tuesday.  Was given toradol shot and baclofen at the urgent care.  The Toradol injection helped until around 10 PM that night and the baclofen has not been helping him.  He describes the pain as a cramping sensation that goes from his back and buttocks down both of his legs.  Denies any bowel or bladder incontinence.  Denies any saddle anesthesia.  Denies any leg weakness.  No swelling or erythema.  No fevers or sick symptoms.  Denies any known history of leg clots.  Patient says that he has had sensations in his legs like this before usually when he is sick.  He says that moving around and keeping his legs and motion helps the feeling of cramping/pain.  Standing or lying or sitting still makes the sensation worse.  Denies any antimalarial medications currently or recently.  PERTINENT  PMH / PSH: HTN, T2DM  OBJECTIVE:   BP 138/74   Pulse 95   Ht '5\' 9"'$  (1.753 m)   Wt 153 lb 3.2 oz (69.5 kg)   SpO2 98%   BMI 22.62 kg/m   General: Well appearing, NAD, awake, alert, responsive to questions Head: Normocephalic atraumatic CV: Regular rate and rhythm no murmurs rubs or gallops Respiratory: Clear to ausculation bilaterally, no wheezes rales or crackles, chest rises symmetrically,  no increased work of breathing Extremities: Moves upper and lower extremities freely, no edema in LE, no overlying skin changes or erythema, no tenderness to palpation on examination, 2+ PT and DP pulses bilaterally, well-perfused lower extremity, sensation intact Neuro: No focal deficits Skin: No rashes or lesions visualized   ASSESSMENT/PLAN:   Leg  cramping Suspect most likely muscular given this had responded to Toradol in the urgent care.  Unlikely to be cauda equina syndrome given patient does not have any weakness or sensation differences or red flags such as saddle anesthesia or bowel or bladder incontinence.  Patient did have a long trip recently however does not have any clinical signs of DVT and bilateral nature makes me think this is not the most likely diagnosis.  Given patient has had symptoms like this in the past possible that this could be restless leg syndrome-can trial iron pills to see if this may help as well.  Labs reviewed from urgent care yesterday and no significant electrolyte disorders or signs of hemolysis. -Naproxen twice daily for 5 days -Ferrous sulfate 325 every other day -Follow-up with PCP to see if improving in 1 to 2 weeks -ED/return precautions discussed with patient in depth   Gerrit Heck, MD [place]

## 2022-05-16 NOTE — Patient Instructions (Signed)
It was great to see you! Thank you for allowing me to participate in your care!   Our plans for today:  -I am going to send in a naproxen course to take for 5 days as this may be a muscular issue and he responded to the medication -This could possibly be restless leg syndrome, although it is strange that it happened acutely, I am going to send in iron tablets to take every other day and I would like you to follow-up with your primary care physician in 1 to 2 weeks -Please return to care if starting to have shortness of breath, chest pain, swelling or redness of your legs or the pain is not resolving  Take care and seek immediate care sooner if you develop any concerns.  Gerrit Heck, MD

## 2022-05-16 NOTE — Assessment & Plan Note (Signed)
Suspect most likely muscular given this had responded to Toradol in the urgent care.  Unlikely to be cauda equina syndrome given patient does not have any weakness or sensation differences or red flags such as saddle anesthesia or bowel or bladder incontinence.  Patient did have a long trip recently however does not have any clinical signs of DVT and bilateral nature makes me think this is not the most likely diagnosis.  Given patient has had symptoms like this in the past possible that this could be restless leg syndrome-can trial iron pills to see if this may help as well.  Labs reviewed from urgent care yesterday and no significant electrolyte disorders or signs of hemolysis. -Naproxen twice daily for 5 days -Ferrous sulfate 325 every other day -Follow-up with PCP to see if improving in 1 to 2 weeks -ED/return precautions discussed with patient in depth

## 2022-05-22 ENCOUNTER — Other Ambulatory Visit: Payer: Self-pay | Admitting: Student

## 2022-05-22 DIAGNOSIS — E1140 Type 2 diabetes mellitus with diabetic neuropathy, unspecified: Secondary | ICD-10-CM

## 2022-05-22 DIAGNOSIS — E1165 Type 2 diabetes mellitus with hyperglycemia: Secondary | ICD-10-CM

## 2022-05-22 DIAGNOSIS — I10 Essential (primary) hypertension: Secondary | ICD-10-CM

## 2022-05-23 ENCOUNTER — Telehealth: Payer: Self-pay | Admitting: Student

## 2022-05-23 NOTE — Telephone Encounter (Signed)
Called patient to schedule Medicare Annual Wellness Visit (AWV). No voicemail available to leave a message.  Last date of AWV: AWVI eligible as of 04/17/2013   Please schedule an appointment at any time with [place], [place].  If any questions, please contact me at [phone].    Thank you,  [place] Direct dial  [phone]

## 2022-05-29 ENCOUNTER — Telehealth: Payer: Self-pay

## 2022-05-29 NOTE — Telephone Encounter (Signed)
Patient calls nurse line in regards to blood pressure.   He reports he has not taken Amlodipine in ~ 2 weeks. However, his blood pressure has been ranging [id number] systolic and 0000000 diastolic. Most recently today's reading was 107/64.  He reports he has felt dizzy and lethargic since his noticing BP droppings.   He denies any LOC, vision changes, headaches, SOB or chest pain.   Patient scheduled for tomorrow for evaluation.   Precautions discussed.

## 2022-05-30 ENCOUNTER — Ambulatory Visit (INDEPENDENT_AMBULATORY_CARE_PROVIDER_SITE_OTHER): Payer: 59 | Admitting: Student

## 2022-05-30 VITALS — BP 130/62 | HR 86 | Wt 157.4 lb

## 2022-05-30 DIAGNOSIS — K219 Gastro-esophageal reflux disease without esophagitis: Secondary | ICD-10-CM

## 2022-05-30 DIAGNOSIS — J302 Other seasonal allergic rhinitis: Secondary | ICD-10-CM | POA: Diagnosis not present

## 2022-05-30 DIAGNOSIS — E119 Type 2 diabetes mellitus without complications: Secondary | ICD-10-CM

## 2022-05-30 DIAGNOSIS — N529 Male erectile dysfunction, unspecified: Secondary | ICD-10-CM

## 2022-05-30 DIAGNOSIS — I10 Essential (primary) hypertension: Secondary | ICD-10-CM

## 2022-05-30 DIAGNOSIS — Z794 Long term (current) use of insulin: Secondary | ICD-10-CM

## 2022-05-30 DIAGNOSIS — N401 Enlarged prostate with lower urinary tract symptoms: Secondary | ICD-10-CM

## 2022-05-30 DIAGNOSIS — R3912 Poor urinary stream: Secondary | ICD-10-CM

## 2022-05-30 MED ORDER — CETIRIZINE HCL 10 MG PO TABS: ORAL_TABLET | ORAL | 1 refills | Status: DC

## 2022-05-30 MED ORDER — TAMSULOSIN HCL 0.4 MG PO CAPS: 0.4000 mg | ORAL_CAPSULE | Freq: Every day | ORAL | 0 refills | Status: DC

## 2022-05-30 MED ORDER — ACCU-CHEK GUIDE VI STRP: ORAL_STRIP | 12 refills | Status: DC

## 2022-05-30 MED ORDER — OMEPRAZOLE 40 MG PO CPDR: 40.0000 mg | DELAYED_RELEASE_CAPSULE | Freq: Every day | ORAL | 1 refills | Status: DC

## 2022-05-30 NOTE — Assessment & Plan Note (Signed)
Requested Viagra prescription.  States he has required 2 doses of Viagra 50 mg in order to have erection.  I advised him that I would like to make sure his dizziness does not return and he has his blood pressure medication regimen under control prior to prescribing this.  Return in 2 weeks and we may discuss further.

## 2022-05-30 NOTE — Assessment & Plan Note (Addendum)
Well-controlled on amlodipine 10 mg daily, losartan 50 mg daily.  After full med rec, his low blood pressure and dizziness may be related to baclofen dosing prior to episode or confusion with blood pressure medications.  Unfortunately he had 2 bottles of losartan in which medications appeared different.  May also consider orthostatic hypotension.  He is not literate in [place] and therefore takes medications based off of visual appearance.  I have confiscated his almost empty bottle of losartan in addition to other empty bottles and clarified medications on his chart.  I have recommended he only use baclofen at nighttime for when he has leg cramping.

## 2022-05-30 NOTE — Patient Instructions (Signed)
It was great to see you today! Thank you for choosing [facility] for your primary care. Mitchell Page was seen for med rec.  Today we addressed: I have refilled several of your medications.  You are taking the right blood pressure medications.  I believe your dizziness was related to taking the muscle relaxer.  Please only take this at night if you are having muscle cramps.  Come back to see me in 2 to 3 weeks we can talk about your chronic medical conditions in addition to Viagra prescription.  If you haven't already, sign up for My Chart to have easy access to your labs results, and communication with your primary care physician.  You should return to our clinic Return in about 2 weeks (around 06/13/2022) for chronic medical care and viagra. Please arrive 15 minutes before your appointment to ensure smooth check in process.  We appreciate your efforts in making this happen.  Thank you for allowing me to participate in your care, Mitchell Guiles, DO 05/30/2022, 9:31 AM PGY-2, [place]

## 2022-05-30 NOTE — Progress Notes (Signed)
  SUBJECTIVE:   CHIEF COMPLAINT / HPI:   Blood pressure management: Patient called yesterday stating he had not taking losartan x 2 weeks however blood pressure readings were still 100s over 60s. He has felt dizzy after standing quickly.  Notes he has stayed properly hydrated.  He has been taking his Amlodipine. Notes his dizziness has resolved.  BP check today: 116/58, our machine 141/75 My manual check: 130/62  He notes he took Baclofen before feeling this way because he was having leg cramping. This was prescribed by Urgent Care.   PERTINENT  PMH / PSH: HTN, T2DM, BPH  Patient Care Team: Orvis Brill, DO as PCP - General (Family Medicine) O'Neal, Cassie Freer, MD as PCP - Cardiology (Cardiology) Thelma Comp, OD as Consulting Physician (Optometry) Plyler, Chauncey Reading, RD as Dietitian (Dietician) OBJECTIVE:  BP 130/62   Pulse 86   Wt 157 lb 6.4 oz (71.4 kg)   SpO2 98%   BMI 23.24 kg/m  General: Well-appearing, NAD CV: RRR, 2+ radial pulse Pulm: Normal WOB Neuro: Conversationally appropriate, gait intact, no focal neurological deficits, moves extremities equally and appropriately  ASSESSMENT/PLAN:  Essential hypertension Assessment & Plan: Well-controlled on amlodipine 10 mg daily, losartan 50 mg daily.  After full med rec, his low blood pressure and dizziness may be related to baclofen dosing prior to episode or confusion with blood pressure medications.  Unfortunately he had 2 bottles of losartan in which medications appeared different.  May also consider orthostatic hypotension.  He is not literate in [place] and therefore takes medications based off of visual appearance.  I have confiscated his almost empty bottle of losartan in addition to other empty bottles and clarified medications on his chart.  I have recommended he only use baclofen at nighttime for when he has leg cramping.   Erectile dysfunction, unspecified erectile dysfunction type Assessment &  Plan: Requested Viagra prescription.  States he has required 2 doses of Viagra 50 mg in order to have erection.  I advised him that I would like to make sure his dizziness does not return and he has his blood pressure medication regimen under control prior to prescribing this.  Return in 2 weeks and we may discuss further.   Other seasonal allergic rhinitis -     Cetirizine HCl; TAKE 1 TABLET(10 MG) BY MOUTH DAILY for allergies  Dispense: 90 tablet; Refill: 1  Gastroesophageal reflux disease without esophagitis -     Omeprazole; Take 1 capsule (40 mg total) by mouth daily.  Dispense: 90 capsule; Refill: 1  Benign prostatic hyperplasia with weak urinary stream -     Tamsulosin HCl; Take 1 capsule (0.4 mg total) by mouth daily.  Dispense: 90 capsule; Refill: 0  Type 2 diabetes mellitus without complication, with long-term current use of insulin (HCC) -     Accu-Chek Guide; Use to test blood glucose up to 3 times daily. Dx E11.8, E11.65, Z79.4  Dispense: 100 each; Refill: 12  Return in about 2 weeks (around 06/13/2022) for chronic medical care and viagra. [name], DO 05/30/2022, 11:14 AM PGY-2, [facility]

## 2022-06-07 ENCOUNTER — Other Ambulatory Visit: Payer: Self-pay | Admitting: Student

## 2022-06-07 DIAGNOSIS — I10 Essential (primary) hypertension: Secondary | ICD-10-CM

## 2022-07-01 ENCOUNTER — Ambulatory Visit (INDEPENDENT_AMBULATORY_CARE_PROVIDER_SITE_OTHER): Payer: 59 | Admitting: Family Medicine

## 2022-07-01 ENCOUNTER — Encounter: Payer: Self-pay | Admitting: Family Medicine

## 2022-07-01 VITALS — BP 162/95 | HR 83 | Temp 97.9°F | Ht 69.0 in | Wt 152.2 lb

## 2022-07-01 DIAGNOSIS — N401 Enlarged prostate with lower urinary tract symptoms: Secondary | ICD-10-CM

## 2022-07-01 DIAGNOSIS — I10 Essential (primary) hypertension: Secondary | ICD-10-CM | POA: Diagnosis not present

## 2022-07-01 DIAGNOSIS — K219 Gastro-esophageal reflux disease without esophagitis: Secondary | ICD-10-CM | POA: Diagnosis not present

## 2022-07-01 DIAGNOSIS — M545 Low back pain, unspecified: Secondary | ICD-10-CM

## 2022-07-01 DIAGNOSIS — N529 Male erectile dysfunction, unspecified: Secondary | ICD-10-CM

## 2022-07-01 DIAGNOSIS — R252 Cramp and spasm: Secondary | ICD-10-CM | POA: Diagnosis not present

## 2022-07-01 DIAGNOSIS — E1140 Type 2 diabetes mellitus with diabetic neuropathy, unspecified: Secondary | ICD-10-CM

## 2022-07-01 DIAGNOSIS — E119 Type 2 diabetes mellitus without complications: Secondary | ICD-10-CM

## 2022-07-01 DIAGNOSIS — G8929 Other chronic pain: Secondary | ICD-10-CM

## 2022-07-01 DIAGNOSIS — K279 Peptic ulcer, site unspecified, unspecified as acute or chronic, without hemorrhage or perforation: Secondary | ICD-10-CM

## 2022-07-01 DIAGNOSIS — Z794 Long term (current) use of insulin: Secondary | ICD-10-CM

## 2022-07-01 DIAGNOSIS — R3912 Poor urinary stream: Secondary | ICD-10-CM

## 2022-07-01 MED ORDER — SILDENAFIL CITRATE 100 MG PO TABS: 100.0000 mg | ORAL_TABLET | Freq: Every day | ORAL | 11 refills | Status: DC | PRN

## 2022-07-01 MED ORDER — OMEPRAZOLE 40 MG PO CPDR: 40.0000 mg | DELAYED_RELEASE_CAPSULE | Freq: Every day | ORAL | 1 refills | Status: DC

## 2022-07-01 MED ORDER — TRAMADOL HCL 50 MG PO TABS: 50.0000 mg | ORAL_TABLET | Freq: Every day | ORAL | 0 refills | Status: DC | PRN

## 2022-07-01 MED ORDER — LOSARTAN POTASSIUM 50 MG PO TABS: ORAL_TABLET | ORAL | 1 refills | Status: DC

## 2022-07-01 MED ORDER — GABAPENTIN 300 MG PO CAPS: 300.0000 mg | ORAL_CAPSULE | Freq: Two times a day (BID) | ORAL | 0 refills | Status: DC

## 2022-07-01 MED ORDER — BLOOD GLUCOSE MONITORING SUPPL DEVI: 1.0000 | Freq: Three times a day (TID) | 0 refills | Status: DC

## 2022-07-01 MED ORDER — BLOOD GLUCOSE TEST VI STRP: 1.0000 | ORAL_STRIP | Freq: Three times a day (TID) | 0 refills | Status: DC

## 2022-07-01 MED ORDER — TAMSULOSIN HCL 0.4 MG PO CAPS: 0.4000 mg | ORAL_CAPSULE | Freq: Every day | ORAL | 0 refills | Status: DC

## 2022-07-01 MED ORDER — IRON 325 (65 FE) MG PO TABS: 1.0000 | ORAL_TABLET | ORAL | 0 refills | Status: DC

## 2022-07-01 NOTE — Assessment & Plan Note (Addendum)
Few reports dizzy episode, resolve with food intake. No reports side effects with Sildenafil use, has taken two  tablets without side effects. Likely secondary to T2DM. -Increase to Sildenafil  prn

## 2022-07-01 NOTE — Assessment & Plan Note (Addendum)
A1c 8.7 in 05/15/22. Not checking sugars since out of strips and cannot find strips for meter. Compliant on Lantus 28U, Jardiance  and Metformin  BID. Neuropathy controlled. -Ordered new glucometer with strips, recommend home monitoring -Needs lipid panel and ACR at next visit -Discuss ophthalmology referral for diabetic eye exam at follow up, referred previously but did not attend -Refill Gabapentin  BID

## 2022-07-01 NOTE — Assessment & Plan Note (Signed)
PDMP reviewed and appropriate. Infrequently Tramadol use, last refilled #30 tablets ~ 2 months ago. Symptomatic relief of back pain with use -Refill Tramadol  daily prn x 30 days

## 2022-07-01 NOTE — Assessment & Plan Note (Signed)
Chronic, stable. -Refill Ferrous sulfate  every other day

## 2022-07-01 NOTE — Assessment & Plan Note (Signed)
Elevated to 162/95 upon repeat today. Compliant on Amlodipine  but has been out of Losartan  x 2 days. Checks at home, reportedly in 130s when taking both meds. -Refill Losartan  -Advised to keep BP log and f/u BP check in 3-4 weeks

## 2022-07-01 NOTE — Assessment & Plan Note (Signed)
Chronic, stable. Symptomatic if stops meds. -Refill Omeprazole  daily

## 2022-07-01 NOTE — Patient Instructions (Signed)
It was wonderful to see you today! Thank you for choosing [facility].   Please bring ALL of your medications with you to every visit.   Today we talked about:  I refilled your medications. Please continue to take your blood pressure at home and keep a log. I would like to see you in 3-4 weeks to check your blood pressure again. I sent in a new glucometer and strips so please check your sugar at home as well.  I increased you to Viagra  to take as needed. Please monitor for side effects like feeling dizzy or like you might pass out.  Please follow up in 3-4 weeks  If you haven't already, sign up for My Chart to have easy access to your labs results, and communication with your primary care physician.  Call the clinic at [phone] if your symptoms worsen or you have any concerns.  Please be sure to schedule follow up at the front desk before you leave today.   Elberta Fortis, DO Family Medicine

## 2022-07-01 NOTE — Assessment & Plan Note (Signed)
Chronic, stable. -Refill Tamsulosin 0.4mg  daily

## 2022-07-01 NOTE — Progress Notes (Signed)
SUBJECTIVE:   CHIEF COMPLAINT / HPI:   Hypertension: Seen 3/15 for dizziness upon standing. Possible related to Baclofen use vs taking too much BP meds. Relates occasional dizziness but feels it is related to blood sugar because he feels better once he eats. - Medications: Losartan  (out for the past 2 days), Amlodipine  daily - Compliance:  - Checking BP at home: yes - 140/150s the past two days. States he is in 130s when taking both BP meds - Denies any SOB, CP, vision changes, LE edema, medication SEs, or symptoms of hypotension  Diabetes Current Regimen: Lantus 28U daily, Jardiance  daily, Metformin  BID CBGs: Out of glucometer strips, states refill sent in last time did not work as pharmacy did not have strips for that meter Last A1c:  Lab Results  Component Value Date   HGBA1C 8.7 (H) 05/15/2022    Denies polyuria, polydipsia. Possible hypoglycemia reported as dizziness and fatigue that improves after he eats but is not checking sugars. No reported low BGL. Last Eye Exam: DUE Statin: Atorvastatin  ACE/ARB: Losartan    Erectile dysfunction States he would like to increase to Viagra  tablets since the  alone are not effective. States his wife left him recently and he feel his sexual dysfunction has contributed. Has taken two  tablets previously with effectiveness. Denies feels dizzy or passing out while using medication.   PERTINENT  PMH / PSH: T2DM, HTN, erectile dysfunction  OBJECTIVE:   BP (!) 162/95   Pulse 83   Temp 97.9 F (36.6 C)   Ht  (1.753 m)   Wt 152 lb 3.2 oz (69 kg)   SpO2 99%   BMI 22.48 kg/m    General: NAD, pleasant, able to participate in exam Cardiac: RRR, no murmurs. Respiratory: CTAB, normal effort, No wheezes, rales or rhonchi Abdomen: Bowel sounds present, nontender, nondistended Extremities: no edema or cyanosis. Skin: warm and dry. Dry skin on feet bilaterally. Neuro: alert, no obvious focal  deficits Psych: Normal affect and mood  ASSESSMENT/PLAN:   Type 2 diabetes mellitus with diabetic neuropathy, unspecified (HCC) A1c 8.7 in 05/15/22. Not checking sugars since out of strips and cannot find strips for meter. Compliant on Lantus 28U, Jardiance  and Metformin  BID. Neuropathy controlled. -Ordered new glucometer with strips, recommend home monitoring -Needs lipid panel and ACR at next visit -Discuss ophthalmology referral for diabetic eye exam at follow up, referred previously but did not attend -Refill Gabapentin  BID  Erectile dysfunction Few reports dizzy episode, resolve with food intake. No reports side effects with Sildenafil use, has taken two  tablets without side effects. Likely secondary to T2DM. -Increase to Sildenafil  prn  Essential hypertension Elevated to 162/95 upon repeat today. Compliant on Amlodipine  but has been out of Losartan  x 2 days. Checks at home, reportedly in 130s when taking both meds. -Refill Losartan  -Advised to keep BP log and f/u BP check in 3-4 weeks  BPH (benign prostatic hyperplasia) Chronic, stable. -Refill Tamsulosin 0.4mg  daily  PUD (peptic ulcer disease) Chronic, stable. Symptomatic if stops meds. -Refill Omeprazole  daily  Leg cramping Chronic, stable. -Refill Ferrous sulfate  every other day  Back pain PDMP reviewed and appropriate. Infrequently Tramadol use, last refilled #30 tablets ~ 2 months ago. Symptomatic relief of back pain with use -Refill Tramadol  daily prn x 30 days   Recommend patient to schedule physical with PCP to discuss recommended vaccinations and screenings  Dr. Florentina Addison  Ardyth Harps DO [facility]

## 2022-07-26 ENCOUNTER — Other Ambulatory Visit: Payer: Self-pay | Admitting: Family Medicine

## 2022-07-26 DIAGNOSIS — R252 Cramp and spasm: Secondary | ICD-10-CM

## 2022-08-05 NOTE — Progress Notes (Signed)
    SUBJECTIVE:   CHIEF COMPLAINT / HPI:   Hypertension: - Medications: Losartan 50mg , Amlodipine 10mg  daily  - Compliance: Yes - Checking BP at home: Yes - showed log, average BP around 120-130 SBP and 70-80 DBP - Denies any SOB, CP, vision changes, LE edema, medication SEs, or symptoms of hypotension  Diabetes Current Regimen: Lantus 20U daily, Jardiance 10mg  daily, Metformin 1000mg  BID  CBGs: Fasting BGL 130-140s. Not checking sugar throughout the day. Has glucometer at home but would be interested in getting CGM. Last A1c:  Lab Results  Component Value Date   HGBA1C 9.8 (A) 08/07/2022    Denies polyuria, polydipsia, hypoglycemia. Last Eye Exam: DUE - discussed at last visit and has not scheduled Statin: Atorvastatin 40mg   ACE/ARB: Losartan 50mg     Erectile dysfunction Reports he picked up the new prescription for Sildenafil and feels it does not work as well as the brand name Viagra. Discussed the medication formulation is the same. Would like the brand name but discussed that is often not covered by insurance and could be expensive. States he does not have money to pay for this. Reports he is still sad since his wife left him but is hopeful they can work it out.  PERTINENT  PMH / PSH: T2DM with neuropathy, migraines, HTN, GERD, BPH, erectile dysfunction  OBJECTIVE:   BP 128/70   Pulse 80   Ht 5\' 9"  (1.753 m)   Wt 152 lb (68.9 kg)   SpO2 96%   BMI 22.45 kg/m    General: Alert, no apparent distress, well groomed HEENT: Normocephalic, atraumatic, moist mucus membranes, neck supple Respiratory: Normal respiratory effort GI: Non-distended Skin: No rashes, no jaundice Psych: Appropriate mood and affect  ASSESSMENT/PLAN:   Essential hypertension Controlled today and on home log. Continue current regimen of Losartan 50mg  and Amlodipine 10mg  daily. -Continue checking BP a few times per week  Type 2 diabetes mellitus with diabetic neuropathy, unspecified (HCC) A1c 9.8  (increased from 8.7 in 04/2022). Fasting BGL 130-140s, not checking otherwise. Compliant on Lantus 20U, Jardiance 10mg  and Metformin 1000mg  BID. Will not increase Jardiance due to transient dizziness upon standing. -Start CGM, advised patient to pick up monitor and come in to meet with Pharmacist to discuss setting it up if needed -Start Ozempic 0.25mg  weekly x 4 weeks. Instructed on use but may need additional assistance -ACR obtained today  Allergic rhinitis Taking Zyrtec and Flonase at home without relief. Persistent allergy symptoms with rhinorrhea and sneezing. -Start Dymista, 1 spray in each nostril BID  Erectile dysfunction Patient concerned the Sildinafil is less effective than brand name Viagra. Reassured patient it is the same medication. Brand name unlikely to be covered by insurance and patient states he cannot afford it.  -Agreed to continue current prescription. Refill sent but advised patient there are refills available through the Pharmacy when he runs out.  Migraine Reports migraine about once per month, used Fioricet with relief but recently ran out. PDMP reviewed and no refill since 2022 -Refill Fioricet #20 pills, advised to take sparingly    Dr. Elberta Fortis, DO [facility]

## 2022-08-07 ENCOUNTER — Other Ambulatory Visit: Payer: Self-pay | Admitting: Family Medicine

## 2022-08-07 ENCOUNTER — Ambulatory Visit (INDEPENDENT_AMBULATORY_CARE_PROVIDER_SITE_OTHER): Payer: 59 | Admitting: Family Medicine

## 2022-08-07 ENCOUNTER — Encounter: Payer: Self-pay | Admitting: Family Medicine

## 2022-08-07 VITALS — BP 128/70 | HR 80 | Ht 69.0 in | Wt 152.0 lb

## 2022-08-07 DIAGNOSIS — E1140 Type 2 diabetes mellitus with diabetic neuropathy, unspecified: Secondary | ICD-10-CM

## 2022-08-07 DIAGNOSIS — J301 Allergic rhinitis due to pollen: Secondary | ICD-10-CM | POA: Diagnosis not present

## 2022-08-07 DIAGNOSIS — Z794 Long term (current) use of insulin: Secondary | ICD-10-CM

## 2022-08-07 DIAGNOSIS — I10 Essential (primary) hypertension: Secondary | ICD-10-CM

## 2022-08-07 DIAGNOSIS — N529 Male erectile dysfunction, unspecified: Secondary | ICD-10-CM | POA: Diagnosis not present

## 2022-08-07 DIAGNOSIS — G43809 Other migraine, not intractable, without status migrainosus: Secondary | ICD-10-CM | POA: Diagnosis not present

## 2022-08-07 DIAGNOSIS — E1165 Type 2 diabetes mellitus with hyperglycemia: Secondary | ICD-10-CM

## 2022-08-07 LAB — POCT GLYCOSYLATED HEMOGLOBIN (HGB A1C): HbA1c, POC (controlled diabetic range): 9.8 % — AB (ref 0.0–7.0)

## 2022-08-07 MED ORDER — AZELASTINE-FLUTICASONE 137-50 MCG/ACT NA SUSP: 1.0000 | Freq: Two times a day (BID) | NASAL | 3 refills | Status: DC

## 2022-08-07 MED ORDER — BUTALBITAL-APAP-CAFFEINE 50-325-40 MG PO TABS: 1.0000 | ORAL_TABLET | Freq: Four times a day (QID) | ORAL | 2 refills | Status: DC | PRN

## 2022-08-07 MED ORDER — FREESTYLE LIBRE 3 SENSOR MISC: 1.0000 [IU] | 2 refills | Status: DC

## 2022-08-07 MED ORDER — SILDENAFIL CITRATE 100 MG PO TABS: 100.0000 mg | ORAL_TABLET | Freq: Every day | ORAL | 5 refills | Status: DC | PRN

## 2022-08-07 MED ORDER — SEMAGLUTIDE(0.25 OR 0.5MG/DOS) 2 MG/1.5ML ~~LOC~~ SOPN: 0.2500 mg | PEN_INJECTOR | SUBCUTANEOUS | 0 refills | Status: DC

## 2022-08-07 NOTE — Patient Instructions (Addendum)
It was wonderful to see you today! Thank you for choosing [facility].   Please bring ALL of your medications with you to every visit.   Today we talked about:  Your A1c is 9.8, it increased from your last visit. I would like to start you on Ozempic, a weekly injection medication that can help with your sugars. I also sent in a continuous glucose monitor that you can apply to your arm every other week. It uses an app on your phone to track your blood sugar. I am sending in the nasal spray that you use one spray , twice per day at home for allergy relief.  Please follow up in the pharmacy clinic for diabetes education  If you haven't already, sign up for My Chart to have easy access to your labs results, and communication with your primary care physician.   We are checking some labs today. If they are abnormal, I will call you. If they are normal, I will send you a MyChart message (if it is active) or a letter in the mail. If you do not hear about your labs in the next 2 weeks, please call the office.  Call the clinic at [phone] if your symptoms worsen or you have any concerns.  Please be sure to schedule follow up at the front desk before you leave today.   Elberta Fortis, DO Family Medicine

## 2022-08-08 ENCOUNTER — Ambulatory Visit (INDEPENDENT_AMBULATORY_CARE_PROVIDER_SITE_OTHER): Payer: 59 | Admitting: Pharmacist

## 2022-08-08 ENCOUNTER — Encounter: Payer: Self-pay | Admitting: Family Medicine

## 2022-08-08 ENCOUNTER — Other Ambulatory Visit: Payer: Self-pay | Admitting: Family Medicine

## 2022-08-08 DIAGNOSIS — E1140 Type 2 diabetes mellitus with diabetic neuropathy, unspecified: Secondary | ICD-10-CM

## 2022-08-08 DIAGNOSIS — Z794 Long term (current) use of insulin: Secondary | ICD-10-CM

## 2022-08-08 DIAGNOSIS — E1165 Type 2 diabetes mellitus with hyperglycemia: Secondary | ICD-10-CM

## 2022-08-08 DIAGNOSIS — G43909 Migraine, unspecified, not intractable, without status migrainosus: Secondary | ICD-10-CM | POA: Insufficient documentation

## 2022-08-08 LAB — MICROALBUMIN / CREATININE URINE RATIO
Creatinine, Urine: 31.1 mg/dL
Microalb/Creat Ratio: 10 mg/g creat (ref 0–29)
Microalbumin, Urine: 3.1 ug/mL

## 2022-08-08 MED ORDER — SEMAGLUTIDE (1 MG/DOSE) 4 MG/3ML ~~LOC~~ SOPN: 0.2500 mg | PEN_INJECTOR | SUBCUTANEOUS | 0 refills | Status: DC

## 2022-08-08 MED ORDER — LANTUS SOLOSTAR 100 UNIT/ML ~~LOC~~ SOPN: 20.0000 [IU] | PEN_INJECTOR | Freq: Every day | SUBCUTANEOUS | 0 refills | Status: DC

## 2022-08-08 NOTE — Assessment & Plan Note (Signed)
Controlled today and on home log. Continue current regimen of Losartan 50mg  and Amlodipine 10mg  daily. -Continue checking BP a few times per week

## 2022-08-08 NOTE — Assessment & Plan Note (Signed)
Taking Zyrtec and Flonase at home without relief. Persistent allergy symptoms with rhinorrhea and sneezing. -Start Dymista, 1 spray in each nostril BID

## 2022-08-08 NOTE — Assessment & Plan Note (Addendum)
Reports migraine about once per month, used Fioricet with relief but recently ran out. PDMP reviewed and no refill since 2022 -Refill Fioricet #20 pills, advised to take sparingly

## 2022-08-08 NOTE — Patient Instructions (Signed)
Nice to meet you today.   Please ask your daughter to help you "download" the LIBRE 3  app from your app store.  You will need your Apple ID to do this.   Please bring back your device AND sensors next Wednesday at 9:30 for an appointment with me.   Please start Ozempic (new pen) at 0.25mg  once WEEKLY.  This is in addition to your other medications.   I look forward to seeing you next Wednesday.  I hope your daughter will be able to join [place].

## 2022-08-08 NOTE — Assessment & Plan Note (Signed)
Diabetes treated currently with insulin therapy. Arrives for education of CGM without appointment.  Unable to assist due to lack of password to allow download of required APP.   Initiation of Ozempic (semaglutide) - patient educated on purpose, proper use and potential adverse effects of nausea.  Following instruction patient verbalized understanding of treatment plan.

## 2022-08-08 NOTE — Assessment & Plan Note (Addendum)
A1c 9.8 (increased from 8.7 in 04/2022). Fasting BGL 130-140s, not checking otherwise. Compliant on Lantus 20U, Jardiance 10mg  and Metformin 1000mg  BID. Will not increase Jardiance due to transient dizziness upon standing. -Start CGM, advised patient to pick up monitor and come in to meet with Pharmacist to discuss setting it up if needed -Start Ozempic 0.25mg  weekly x 4 weeks. Instructed on use but may need additional assistance -ACR obtained today

## 2022-08-08 NOTE — Progress Notes (Signed)
    S:     Chief Complaint  Patient presents with   Medication Management    CGM and Ozempic teaching   67 y.o. male who presents without scheduled appointment - requesting assistance with CGM initiation.   Patient was referred and last seen by Primary Care Provider, Dr. Ardyth Harps, on 08/07/2022.   At last visit, Ozempic (semaglutide) was ordered and planned to be started in addition to his insulin therapy.   Today, patient arrives in good spirits and presents without any assistance.  Patient visit was abbreviated due to it being unscheduled.  Attempted to assist with CGM download to his I-phone however patient did not know his AppleID.  (He plans to have daughter assist with downloading app later today)    A/P: Diabetes treated currently with insulin therapy. Arrives for education of CGM without appointment.  Unable to assist due to lack of password to allow download of required APP.   Initiation of Ozempic (semaglutide) - patient educated on purpose, proper use and potential adverse effects of nausea.  Following instruction patient verbalized understanding of treatment plan.    Written patient instructions provided. Patient verbalized understanding of treatment plan.  He agreed to come to a visit in 6 days to initiate his LIbre 3 CGM.  Total time in face to face counseling 22 minutes.    Follow-up:  Pharmacist 6 days.   No charge due to brevity of visit.

## 2022-08-08 NOTE — Assessment & Plan Note (Addendum)
Patient concerned the Sildinafil is less effective than brand name Viagra. Reassured patient it is the same medication. Brand name unlikely to be covered by insurance and patient states he cannot afford it.  -Agreed to continue current prescription. Refill sent but advised patient there are refills available through the Pharmacy when he runs out.

## 2022-08-10 ENCOUNTER — Other Ambulatory Visit: Payer: Self-pay | Admitting: Family Medicine

## 2022-08-10 DIAGNOSIS — E1165 Type 2 diabetes mellitus with hyperglycemia: Secondary | ICD-10-CM

## 2022-08-10 DIAGNOSIS — E1140 Type 2 diabetes mellitus with diabetic neuropathy, unspecified: Secondary | ICD-10-CM

## 2022-08-10 MED ORDER — INSULIN GLARGINE 100 UNIT/ML SOLOSTAR PEN: 20.0000 [IU] | PEN_INJECTOR | SUBCUTANEOUS | 11 refills | Status: DC

## 2022-08-11 NOTE — Progress Notes (Signed)
Reviewed and agree with Dr Koval's plan.   

## 2022-08-12 ENCOUNTER — Other Ambulatory Visit: Payer: Self-pay | Admitting: Family Medicine

## 2022-08-12 DIAGNOSIS — E1140 Type 2 diabetes mellitus with diabetic neuropathy, unspecified: Secondary | ICD-10-CM

## 2022-08-12 MED ORDER — TRESIBA FLEXTOUCH 100 UNIT/ML ~~LOC~~ SOPN: 20.0000 [IU] | PEN_INJECTOR | Freq: Every day | SUBCUTANEOUS | 3 refills | Status: DC

## 2022-08-12 NOTE — Addendum Note (Signed)
Addended by: Kathrin Ruddy on: 08/12/2022 09:50 AM   Modules accepted: Orders

## 2022-08-12 NOTE — Telephone Encounter (Signed)
Prescription Formulary coverage issue for Lantus (insulin glargine)   Appears Tresiba (insulin degludec) preferred.  New prescription for Tresiba 20 units once daily prescribed.   Follow-up planned with me tomorrow - 5/29 for CGM set-up.   Reassess supply of insulin at that time.

## 2022-08-13 ENCOUNTER — Encounter: Payer: Self-pay | Admitting: Pharmacist

## 2022-08-13 ENCOUNTER — Ambulatory Visit (INDEPENDENT_AMBULATORY_CARE_PROVIDER_SITE_OTHER): Payer: 59 | Admitting: Pharmacist

## 2022-08-13 VITALS — BP 134/82 | HR 74 | Ht 70.0 in | Wt 152.2 lb

## 2022-08-13 DIAGNOSIS — E1165 Type 2 diabetes mellitus with hyperglycemia: Secondary | ICD-10-CM

## 2022-08-13 DIAGNOSIS — Z794 Long term (current) use of insulin: Secondary | ICD-10-CM | POA: Diagnosis not present

## 2022-08-13 DIAGNOSIS — E1140 Type 2 diabetes mellitus with diabetic neuropathy, unspecified: Secondary | ICD-10-CM | POA: Diagnosis not present

## 2022-08-13 MED ORDER — TRESIBA FLEXTOUCH 100 UNIT/ML ~~LOC~~ SOPN: 14.0000 [IU] | PEN_INJECTOR | Freq: Every day | SUBCUTANEOUS | 3 refills | Status: DC

## 2022-08-13 NOTE — Patient Instructions (Addendum)
It was nice to see you today!  Your goal blood sugar is 80-130 before eating and less than 180 after eating.  Medication Changes:  Begin Ozempic (semaglutide) 0.25 mg every week.  Decrease Lantus (insulin glargine) to 14 units daily in the morning.  Monitor blood sugars at home and keep a log (glucometer or piece of paper) to bring with you to your next visit.  Keep up the good work with diet and exercise. Aim for a diet full of vegetables, fruit and lean meats (chicken, [place], fish). Try to limit salt intake by eating fresh or frozen vegetables (instead of canned), rinse canned vegetables prior to cooking and do not add any additional salt to meals.

## 2022-08-13 NOTE — Progress Notes (Signed)
S:     Chief Complaint  Patient presents with   Medication Management    DM - CGM start, ozempic start   67 y.o. male who presents for diabetes evaluation, education, and management.   PMH is significant for T2DM Last seen without scheduled appointment on 08/08/22 requesting assistance with CGM initiation, semaglutide injection. Presents to start these today.   Today, patient arrives in good spirits and presents with daughter. Patient brings all medications to visit today.   Patient reports Diabetes was diagnosed in 2000s.   Current diabetes medications include: Lantus (insulin glargine) 20 units daily, Jardiance (empagliflozin) 10 mg daily Current hypertension medications include: Losartan 50 mg daily Current hyperlipidemia medications include: Atorvastatin 40 mg daily  Patient reports adherence to taking all medications as prescribed.   Do you feel that your medications are working for you? yes Have you been experiencing any side effects to the medications prescribed? no Do you have any problems obtaining medications due to transportation or finances? no Insurance coverage: [employer]  Patient denies hypoglycemic events. Patient reports seeing high numbers currently.  Patient reported dietary habits: Often eats traditional African foods. Carbohydrates include potatoes, rice, fruits  Within the past 12 months, did you worry whether your food would run out before you got money to buy more? no Within the past 12 months, did the food you bought run out, and you didn't have money to get more? no  O:   Review of Systems  All other systems reviewed and are negative.   Physical Exam Constitutional:      Appearance: Normal appearance. He is normal weight.  Neurological:     Mental Status: He is alert.  Psychiatric:        Mood and Affect: Mood normal.        Behavior: Behavior normal.        Thought Content: Thought content normal.        Judgment: Judgment  normal.      Lab Results  Component Value Date   HGBA1C 9.8 (A) 08/07/2022   Vitals:   08/13/22 1002  BP: 134/82  Pulse: 74  SpO2: 99%    Lipid Panel     Component Value Date/Time   CHOL 94 (L) 04/17/2021 0925   TRIG 40 04/17/2021 0925   HDL 49 04/17/2021 0925   CHOLHDL 1.9 04/17/2021 0925   CHOLHDL 2.7 07/07/2014 1621   VLDL 10 07/07/2014 1621   LDLCALC 34 04/17/2021 0925    Clinical Atherosclerotic Cardiovascular Disease (ASCVD): No  The ASCVD Risk score (Arnett DK, et al., 2019) failed to calculate for the following reasons:   The valid total cholesterol range is 130 to 320 mg/dL   A/P: Diabetes longstanding currently uncontrolled with recent A1c 9.8% on 08/07/22. Patient is  able to verbalize appropriate hypoglycemia management plan. Medication adherence appears good. -Decreased dose of basal insulin Lantus (insulin glargine) 20 units to 14 units daily in the morning.  -Instructed to call office if CGM shows numbers <100 mg/dL consistently.  Transitioning to [place] (insulin degludec) due to insurance issues with next refill.  -Started GLP-1 Ozempic (semaglutide) 0.25 mg. Plans to start on Friday or Sunday of this week. -Placed CGM during visit and set up [place] 3 app on daughter's phone. Plans to set up on patient's iPhone when Apple ID issues resolved. -Continued SGLT2-I Jardiance (empagliflozin) 10 mg. Counseled on sick day rules. -Continued metformin.  -Patient educated on purpose, proper use, and potential adverse effects of  medications.  -Extensively discussed pathophysiology of diabetes, recommended lifestyle interventions, dietary effects on blood sugar control. Encouraged exercise with high carbohydrate meals. -Counseled on s/sx of and management of hypoglycemia.  -Next A1c anticipated August 2024.  ASCVD risk - primary prevention in patient with diabetes. Last LDL is 34 at goal of <70 mg/dL. -Continued atorvastatin 40 mg.   Hypertension longstanding  currently near control. Systolic blood pressure goal of <130 mmHg. Medication adherence good. -Continued amlodipine 10 mg, losartan 50 mg.  Written patient instructions provided. Patient verbalized understanding of treatment plan.  Total time in face to face counseling 37 minutes.    Follow-up:  Pharmacist 09/03/22.  Patient seen with Haze Boyden PharmD Candidate.

## 2022-08-13 NOTE — Assessment & Plan Note (Signed)
Diabetes longstanding currently uncontrolled with recent A1c 9.8% on 08/07/22. Patient is  able to verbalize appropriate hypoglycemia management plan. Medication adherence appears good. -Decreased dose of basal insulin Lantus (insulin glargine) 20 units to 14 units daily in the morning.  -Instructed to call office if CGM shows numbers <100 mg/dL consistently.  Transitioning to [place] (insulin degludec) due to insurance issues with next refill.  -Started GLP-1 Ozempic (semaglutide) 0.25 mg. Plans to start on Friday or Sunday of this week. -Placed CGM during visit and set up [place] 3 app on daughter's phone. Plans to set up on patient's iPhone when Apple ID issues resolved. -Continued SGLT2-I Jardiance (empagliflozin) 10 mg. Counseled on sick day rules. -Continued metformin.

## 2022-08-14 ENCOUNTER — Other Ambulatory Visit: Payer: Self-pay | Admitting: Family Medicine

## 2022-08-14 ENCOUNTER — Ambulatory Visit (INDEPENDENT_AMBULATORY_CARE_PROVIDER_SITE_OTHER): Payer: 59 | Admitting: *Deleted

## 2022-08-14 DIAGNOSIS — Z0000 Encounter for general adult medical examination without abnormal findings: Secondary | ICD-10-CM | POA: Diagnosis not present

## 2022-08-14 DIAGNOSIS — E1165 Type 2 diabetes mellitus with hyperglycemia: Secondary | ICD-10-CM

## 2022-08-14 MED ORDER — SEMAGLUTIDE(0.25 OR 0.5MG/DOS) 2 MG/1.5ML ~~LOC~~ SOPN: 0.2500 mg | PEN_INJECTOR | SUBCUTANEOUS | 3 refills | Status: DC

## 2022-08-14 NOTE — Progress Notes (Signed)
Spoke with Pharmacy about issues with Ozempic prescription being denied. They state they do not have low dose Ozempic pens in stock but will order them for the patient. Will prescribed low dose Ozempic pen and Pharmacy to contact patient if unable to refill right away.  Elberta Fortis, DO

## 2022-08-14 NOTE — Patient Instructions (Signed)
Mitchell Page , Thank you for taking time to come for your Medicare Wellness Visit. I appreciate your ongoing commitment to your health goals. Please review the following plan we discussed and let me know if I can assist you in the future.   Screening recommendations/referrals: Colonoscopy: Education provided Recommended yearly ophthalmology/optometry visit for glaucoma screening and checkup Recommended yearly dental visit for hygiene and checkup  Vaccinations: Influenza vaccine: up to date Pneumococcal vaccine: Education provided Tdap vaccine: Education provided Shingles vaccine: Education provided    Advanced directives: Education provided    Preventive Care 65 Years and Older, Male Preventive care refers to lifestyle choices and visits with your health care provider that can promote health and wellness. What does preventive care include? A yearly physical exam. This is also called an annual well check. Dental exams once or twice a year. Routine eye exams. Ask your health care provider how often you should have your eyes checked. Personal lifestyle choices, including: Daily care of your teeth and gums. Regular physical activity. Eating a healthy diet. Avoiding tobacco and drug use. Limiting alcohol use. Practicing safe sex. Taking low doses of aspirin every day. Taking vitamin and mineral supplements as recommended by your health care provider. What happens during an annual well check? The services and screenings done by your health care provider during your annual well check will depend on your age, overall health, lifestyle risk factors, and family history of disease. Counseling  Your health care provider may ask you questions about your: Alcohol use. Tobacco use. Drug use. Emotional well-being. Home and relationship well-being. Sexual activity. Eating habits. History of falls. Memory and ability to understand (cognition). Work and work Astronomer. Screening  You  may have the following tests or measurements: Height, weight, and BMI. Blood pressure. Lipid and cholesterol levels. These may be checked every 5 years, or more frequently if you are over 73 years old. Skin check. Lung cancer screening. You may have this screening every year starting at age 60 if you have a 30-pack-year history of smoking and currently smoke or have quit within the past 15 years. Fecal occult blood test (FOBT) of the stool. You may have this test every year starting at age 10. Flexible sigmoidoscopy or colonoscopy. You may have a sigmoidoscopy every 5 years or a colonoscopy every 10 years starting at age 21. Prostate cancer screening. Recommendations will vary depending on your family history and other risks. Hepatitis C blood test. Hepatitis B blood test. Sexually transmitted disease (STD) testing. Diabetes screening. This is done by checking your blood sugar (glucose) after you have not eaten for a while (fasting). You may have this done every 1-3 years. Abdominal aortic aneurysm (AAA) screening. You may need this if you are a current or former smoker. Osteoporosis. You may be screened starting at age 46 if you are at high risk. Talk with your health care provider about your test results, treatment options, and if necessary, the need for more tests. Vaccines  Your health care provider may recommend certain vaccines, such as: Influenza vaccine. This is recommended every year. Tetanus, diphtheria, and acellular pertussis (Tdap, Td) vaccine. You may need a Td booster every 10 years. Zoster vaccine. You may need this after age 2. Pneumococcal 13-valent conjugate (PCV13) vaccine. One dose is recommended after age 49. Pneumococcal polysaccharide (PPSV23) vaccine. One dose is recommended after age 48. Talk to your health care provider about which screenings and vaccines you need and how often you need them. This information is not  intended to replace advice given to you by your  health care provider. Make sure you discuss any questions you have with your health care provider. Document Released: 03/30/2015 Document Revised: 11/21/2015 Document Reviewed: 01/02/2015 Elsevier Interactive Patient Education  2017 [employer].  Fall Prevention in the Home Falls can cause injuries. They can happen to people of all ages. There are many things you can do to make your home safe and to help prevent falls. What can I do on the outside of my home? Regularly fix the edges of walkways and driveways and fix any cracks. Remove anything that might make you trip as you walk through a door, such as a raised step or threshold. Trim any bushes or trees on the path to your home. Use bright outdoor lighting. Clear any walking paths of anything that might make someone trip, such as rocks or tools. Regularly check to see if handrails are loose or broken. Make sure that both sides of any steps have handrails. Any raised decks and porches should have guardrails on the edges. Have any leaves, snow, or ice cleared regularly. Use sand or salt on walking paths during winter. Clean up any spills in your garage right away. This includes oil or grease spills. What can I do in the bathroom? Use night lights. Install grab bars by the toilet and in the tub and shower. Do not use towel bars as grab bars. Use non-skid mats or decals in the tub or shower. If you need to sit down in the shower, use a plastic, non-slip stool. Keep the floor dry. Clean up any water that spills on the floor as soon as it happens. Remove soap buildup in the tub or shower regularly. Attach bath mats securely with double-sided non-slip rug tape. Do not have throw rugs and other things on the floor that can make you trip. What can I do in the bedroom? Use night lights. Make sure that you have a light by your bed that is easy to reach. Do not use any sheets or blankets that are too big for your bed. They should not hang down  onto the floor. Have a firm chair that has side arms. You can use this for support while you get dressed. Do not have throw rugs and other things on the floor that can make you trip. What can I do in the kitchen? Clean up any spills right away. Avoid walking on wet floors. Keep items that you use a lot in easy-to-reach places. If you need to reach something above you, use a strong step stool that has a grab bar. Keep electrical cords out of the way. Do not use floor polish or wax that makes floors slippery. If you must use wax, use non-skid floor wax. Do not have throw rugs and other things on the floor that can make you trip. What can I do with my stairs? Do not leave any items on the stairs. Make sure that there are handrails on both sides of the stairs and use them. Fix handrails that are broken or loose. Make sure that handrails are as long as the stairways. Check any carpeting to make sure that it is firmly attached to the stairs. Fix any carpet that is loose or worn. Avoid having throw rugs at the top or bottom of the stairs. If you do have throw rugs, attach them to the floor with carpet tape. Make sure that you have a light switch at the top of the stairs  and the bottom of the stairs. If you do not have them, ask someone to add them for you. What else can I do to help prevent falls? Wear shoes that: Do not have high heels. Have rubber bottoms. Are comfortable and fit you well. Are closed at the toe. Do not wear sandals. If you use a stepladder: Make sure that it is fully opened. Do not climb a closed stepladder. Make sure that both sides of the stepladder are locked into place. Ask someone to hold it for you, if possible. Clearly mark and make sure that you can see: Any grab bars or handrails. First and last steps. Where the edge of each step is. Use tools that help you move around (mobility aids) if they are needed. These include: Canes. Walkers. Scooters. Crutches. Turn  on the lights when you go into a dark area. Replace any light bulbs as soon as they burn out. Set up your furniture so you have a clear path. Avoid moving your furniture around. If any of your floors are uneven, fix them. If there are any pets around you, be aware of where they are. Review your medicines with your doctor. Some medicines can make you feel dizzy. This can increase your chance of falling. Ask your doctor what other things that you can do to help prevent falls. This information is not intended to replace advice given to you by your health care provider. Make sure you discuss any questions you have with your health care provider. Document Released: 12/28/2008 Document Revised: 08/09/2015 Document Reviewed: 04/07/2014 Elsevier Interactive Patient Education  2017 [employer].

## 2022-08-14 NOTE — Progress Notes (Signed)
Subjective:   Mitchell Page is a 67 y.o. male who presents for an Initial Medicare Annual Wellness Visit.  I connected with  Dossie Der on 08/14/22 by a telephone enabled telemedicine application and verified that I am speaking with the correct person using two identifiers.   I discussed the limitations of evaluation and management by telemedicine. The patient expressed understanding and agreed to proceed.  Patient location: home  Telephone/home   Review of Systems     Cardiac Risk Factors include: advanced age (>46men, >30 women);male gender;diabetes mellitus;hypertension     Objective:    Today's Vitals   There is no height or weight on file to calculate BMI.     07/01/2022    2:21 PM 05/16/2022    9:22 AM 08/28/2021    2:49 PM 07/25/2021    2:58 PM 10/17/2020    3:27 PM 08/21/2020   11:59 AM 04/27/2020    3:03 PM  Advanced Directives  Does Patient Have a Medical Advance Directive? No No No No No No No  Would patient like information on creating a medical advance directive? No - Patient declined No - Patient declined No - Patient declined  No - Patient declined Yes (ED - Information included in AVS) No - Patient declined    Current Medications (verified) Outpatient Encounter Medications as of 08/14/2022  Medication Sig   Accu-Chek Softclix Lancets lancets Use as instructed   amLODipine (NORVASC) 10 MG tablet TAKE 1 TABLET(10 MG) BY MOUTH AT BEDTIME   atorvastatin (LIPITOR) 40 MG tablet TAKE 1 TABLET(40 MG) BY MOUTH DAILY   Azelastine-Fluticasone 137-50 MCG/ACT SUSP Place 1 spray into the nose 2 (two) times daily.   baclofen (LIORESAL) 10 MG tablet Take 1 tablet (10 mg total) by mouth 3 (three) times daily.   Blood Glucose Monitoring Suppl (ACCU-CHEK GUIDE) w/Device KIT 1 kit by Does not apply route daily. Use to test blood glucose up to 3 times daily. Dx E11.8, E11.65, Z79.4   Blood Glucose Monitoring Suppl DEVI 1 each by Does not apply route in the morning, at noon,  and at bedtime. May substitute to any manufacturer covered by patient's insurance.   Blood Pressure Monitoring (BLOOD PRESSURE CUFF) MISC 1 Units by Does not apply route daily.   butalbital-acetaminophen-caffeine (FIORICET) 50-325-40 MG tablet Take 1-2 tablets by mouth every 6 (six) hours as needed for headache.   cetirizine (ZYRTEC) 10 MG tablet TAKE 1 TABLET(10 MG) BY MOUTH DAILY for allergies   Continuous Glucose Sensor (FREESTYLE LIBRE 3 SENSOR) MISC 1 Units by Does not apply route once a week. Place 1 sensor on the skin every 14 days. Use to check glucose continuously   empagliflozin (JARDIANCE) 10 MG TABS tablet Take 1 tablet (10 mg total) by mouth daily.   ferrous sulfate 325 (65 FE) MG tablet TAKE 1 TABLET BY MOUTH EVERY OTHER DAY   gabapentin (NEURONTIN) 300 MG capsule Take 1 capsule (300 mg total) by mouth 2 (two) times daily. TAKE 1 CAPSULE(300 MG) BY MOUTH TWICE DAILY   glucose blood (ACCU-CHEK GUIDE) test strip Use to test blood glucose up to 3 times daily. Dx E11.8, E11.65, Z79.4   insulin degludec (TRESIBA FLEXTOUCH) 100 UNIT/ML FlexTouch Pen Inject 14 Units into the skin daily.   Insulin Pen Needle (B-D UF III MINI PEN NEEDLES) 31G X 5 MM MISC USE ONCE DAILY AS DIRECTED FOR INSULIN INJECTION   Lancets Misc. (ACCU-CHEK SOFTCLIX LANCET DEV) KIT Use as needed to check blood sugars  losartan (COZAAR) 50 MG tablet TAKE 1 TABLET BY MOUTH EVERY DAY   omeprazole (PRILOSEC) 40 MG capsule Take 1 capsule (40 mg total) by mouth daily.   Semaglutide,0.25 or 0.5MG /DOS, 2 MG/1.5ML SOPN Inject 0.25 mg into the skin once a week. 0.25 mg once weekly for 4 weeks then increase to 0.5 mg weekly for at least 4 weeks,max 1 mg   sildenafil (VIAGRA) 100 MG tablet Take 1 tablet (100 mg total) by mouth daily as needed for erectile dysfunction.   tamsulosin (FLOMAX) 0.4 MG CAPS capsule Take 1 capsule (0.4 mg total) by mouth daily.   fluticasone (FLONASE) 50 MCG/ACT nasal spray Place 2 sprays into both nostrils  daily. SHAKE LIQUID AND USE 2 SPRAYS IN EACH NOSTRIL DAILY   Glucose Blood (BLOOD GLUCOSE TEST STRIPS) STRP 1 each by In Vitro route in the morning, at noon, and at bedtime. May substitute to any manufacturer covered by patient's insurance.   metFORMIN (GLUCOPHAGE) 500 MG tablet Take 2 tablets (1,000 mg total) by mouth 2 (two) times daily with a meal.   traMADol (ULTRAM) 50 MG tablet Take 1 tablet (50 mg total) by mouth daily as needed.   No facility-administered encounter medications on file as of 08/14/2022.    Allergies (verified) Patient has no known allergies.   History: Past Medical History:  Diagnosis Date   Allergic rhinitis    Chronic headache    Cough 04/21/2017   Diabetes mellitus    GERD (gastroesophageal reflux disease)    Hyperlipidemia    Hypertension    Illiteracy and low-level literacy    Unclear of his literacy  in his native tongue, however, he is unable to read in English   Lower back pain    w/ R LE sciatica s/p laminectomy in 2008. MRI- 04/2008   Past Surgical History:  Procedure Laterality Date   LUMBAR LAMINECTOMY  2008   l4-5   Repair of left arm fracture     Family History  Problem Relation Age of Onset   Heart disease Father    Social History   Socioeconomic History   Marital status: Married    Spouse name: Not on file   Number of children: Not on file   Years of education: Not on file   Highest education level: Not on file  Occupational History   Not on file  Tobacco Use   Smoking status: Never   Smokeless tobacco: Never  Vaping Use   Vaping Use: Never used  Substance and Sexual Activity   Alcohol use: No    Alcohol/week: 0.0 standard drinks of alcohol   Drug use: No   Sexual activity: Not on file  Other Topics Concern   Not on file  Social History Narrative   Immigrated from [place] in 1990.   Married, has 3 children.   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    Uninsured.   Highest education - middle school.   No smoking, alcohol or illicit drug use.      Cannot read in [place].   Social Determinants of Health   Financial Resource Strain: Low Risk  (08/14/2022)   Overall Financial Resource Strain (CARDIA)    Difficulty of Paying Living Expenses: Not hard at all  Food Insecurity: No Food Insecurity (08/14/2022)   Hunger Vital Sign    Worried About Running Out of Food in the Last Year: Never true    Ran Out  of Food in the Last Year: Never true  Transportation Needs: No Transportation Needs (08/14/2022)   PRAPARE - Administrator, Civil Service (Medical): No    Lack of Transportation (Non-Medical): No  Physical Activity: Insufficiently Active (08/14/2022)   Exercise Vital Sign    Days of Exercise per Week: 3 days    Minutes of Exercise per Session: 40 min  Stress: No Stress Concern Present (08/14/2022)   [employer] of Occupational Health - Occupational Stress Questionnaire    Feeling of Stress : Not at all  Social Connections: Socially Integrated (08/14/2022)   Social Connection and Isolation Panel [NHANES]    Frequency of Communication with Friends and Family: More than three times a week    Frequency of Social Gatherings with Friends and Family: Three times a week    Attends Religious Services: More than 4 times per year    Active Member of Clubs or Organizations: Yes    Attends Engineer, structural: More than 4 times per year    Marital Status: Married    Tobacco Counseling Counseling given: Not Answered   Clinical Intake:  Pre-visit preparation completed: Yes  Pain : No/denies pain     Diabetes: Yes CBG done?: No Did pt. bring in CBG monitor from home?: No  How often do you need to have someone help you when you read instructions, pamphlets, or other written materials from your doctor or pharmacy?: 2 - Rarely  Diabetic?  Yes  Nutrition Risk Assessment:  Has the patient had any N/V/D within the  last 2 months?  No  Does the patient have any non-healing wounds?  No  Has the patient had any unintentional weight loss or weight gain?  No   Diabetes:  Is the patient diabetic?  Yes  If diabetic, was a CBG obtained today?  No  Did the patient bring in their glucometer from home?  No  How often do you monitor your CBG's? 1 x day.   Financial Strains and Diabetes Management:  Are you having any financial strains with the device, your supplies or your medication? No .  Does the patient want to be seen by Chronic Care Management for management of their diabetes?  No  Would the patient like to be referred to a Nutritionist or for Diabetic Management?  No   Diabetic Exams:  Diabetic Eye Exam: . Pt has been advised about the importance in completing this exam. A referral has been placed today. Message sent to referral coordinator for scheduling purposes. Advised pt to expect a call from office referred to regarding appt.  Diabetic Foot Exam: Pt has been advised about the importance in completing this exam.     Interpreter Needed?: No  Information entered by :: Remi Haggard LPN   Activities of Daily Living    08/14/2022    3:02 PM  In your present state of health, do you have any difficulty performing the following activities:  Hearing? 0  Vision? 0  Difficulty concentrating or making decisions? 0  Walking or climbing stairs? 0  Dressing or bathing? 0  Doing errands, shopping? 0  Preparing Food and eating ? N  Using the Toilet? N  In the past six months, have you accidently leaked urine? Y  Do you have problems with loss of bowel control? N  Managing your Medications? N  Managing your Finances? N  Housekeeping or managing your Housekeeping? N    Patient Care Team: Darral Dash, DO as PCP -  General (Family Medicine) O'Neal, Ronnald Ramp, MD as PCP - Cardiology (Cardiology) Blair Promise, OD as Consulting Physician (Optometry) Plyler, Cecil Cranker, RD as Dietitian  (Dietician)  Indicate any recent Medical Services you may have received from other than [facility] providers in the past year (date may be approximate).     Assessment:   This is a routine wellness examination for [place].  Hearing/Vision screen No results found.  Dietary issues and exercise activities discussed: Current Exercise Habits: Structured exercise class, Type of exercise: treadmill;walking, Time (Minutes): 35, Frequency (Times/Week): 4, Weekly Exercise (Minutes/Week): 140, Intensity: Mild   Goals Addressed             This Visit's Progress    Increase physical activity         Depression Screen    08/14/2022    3:05 PM 05/16/2022    9:22 AM 08/28/2021    2:48 PM 01/07/2021    2:18 PM 12/05/2020   10:10 AM 10/17/2020    3:28 PM  PHQ 2/9 Scores  PHQ - 2 Score 0  0 0 0 0  PHQ- 9 Score 2  0 2  2  Exception Documentation  Patient refusal        Fall Risk    08/14/2022    2:59 PM 08/07/2022    3:24 PM 07/01/2022    2:21 PM 05/16/2022    9:22 AM 08/28/2021    2:48 PM  Fall Risk   Falls in the past year? 0 0 0 0 0  Number falls in past yr: 0 0 0 0 0  Injury with Fall? 0 0 0 0 0  Risk for fall due to :     History of fall(s)  Follow up Falls evaluation completed;Education provided;Falls prevention discussed Falls evaluation completed       FALL RISK PREVENTION PERTAINING TO THE HOME:  Any stairs in or around the home? Yes  If so, are there any without handrails? No  Home free of loose throw rugs in walkways, pet beds, electrical cords, etc? Yes  Adequate lighting in your home to reduce risk of falls? Yes   ASSISTIVE DEVICES UTILIZED TO PREVENT FALLS:  Life alert? No  Use of a cane, walker or w/c? No  Grab bars in the bathroom? No  Shower chair or bench in shower? Yes  Elevated toilet seat or a handicapped toilet? No   TIMED UP AND GO:  Was the test performed? No .    Cognitive Function:        08/14/2022    3:06 PM  6CIT Screen  What Year? 0 points   What month? 0 points  What time? 0 points  Count back from 20 0 points  Months in reverse 0 points  Repeat phrase 0 points  Total Score 0 points    Immunizations Immunization History  Administered Date(s) Administered   Fluad Quad(high Dose 65+) 01/07/2021   Influenza Split 12/30/2010, 01/29/2012   Influenza Whole 05/29/2009   Influenza,inj,Quad PF,6+ Mos 12/10/2012, 11/09/2013, 12/26/2014, 11/09/2015, 04/29/2016, 12/03/2016, 03/12/2018, 02/23/2019, 02/01/2020   PFIZER Comirnaty(Gray Top)Covid-19 Tri-Sucrose Vaccine 04/27/2020, 05/18/2020   Pneumococcal Polysaccharide-23 12/30/2010   Td 05/29/2009    TDAP status: Due, Education has been provided regarding the importance of this vaccine. Advised may receive this vaccine at local pharmacy or Health Dept. Aware to provide a copy of the vaccination record if obtained from local pharmacy or Health Dept. Verbalized acceptance and understanding.  Flu Vaccine status: Up to date  Pneumococcal  vaccine status: Due, Education has been provided regarding the importance of this vaccine. Advised may receive this vaccine at local pharmacy or Health Dept. Aware to provide a copy of the vaccination record if obtained from local pharmacy or Health Dept. Verbalized acceptance and understanding.  Covid-19 vaccine status: Information provided on how to obtain vaccines.   Qualifies for Shingles Vaccine? Yes   Zostavax completed No   Shingrix Completed?: No.    Education has been provided regarding the importance of this vaccine. Patient has been advised to call insurance company to determine out of pocket expense if they have not yet received this vaccine. Advised may also receive vaccine at local pharmacy or Health Dept. Verbalized acceptance and understanding.  Screening Tests Health Maintenance  Topic Date Due   Pneumonia Vaccine 14+ Years old (2 of 2 - PCV) 12/30/2011   OPHTHALMOLOGY EXAM  06/16/2018   FOOT EXAM  03/13/2019   DTaP/Tdap/Td (2 -  Tdap) 05/30/2019   Colonoscopy  10/24/2019   LIPID PANEL  04/17/2022   COVID-19 Vaccine (3 - 2023-24 season) 08/30/2022 (Originally 11/15/2021)   Zoster Vaccines- Shingrix (1 of 2) 11/14/2022 (Originally 05/01/2005)   INFLUENZA VACCINE  10/16/2022   HEMOGLOBIN A1C  11/07/2022   Diabetic kidney evaluation - eGFR measurement  05/15/2023   Diabetic kidney evaluation - Urine ACR  08/07/2023   Medicare Annual Wellness (AWV)  08/14/2023   Hepatitis C Screening  Completed   HPV VACCINES  Aged Out    Health Maintenance  Health Maintenance Due  Topic Date Due   Pneumonia Vaccine 7+ Years old (2 of 2 - PCV) 12/30/2011   OPHTHALMOLOGY EXAM  06/16/2018   FOOT EXAM  03/13/2019   DTaP/Tdap/Td (2 - Tdap) 05/30/2019   Colonoscopy  10/24/2019   LIPID PANEL  04/17/2022    Colonoscopy Education provided   Lung Cancer Screening: (Low Dose CT Chest recommended if Age 27-80 years, 30 pack-year currently smoking OR have quit w/in 15years.) does not qualify.   Lung Cancer Screening Referral:   Additional Screening:  Hepatitis C Screening: does not qualify; Completed 2018  Vision Screening: Recommended annual ophthalmology exams for early detection of glaucoma and other disorders of the eye. Is the patient up to date with their annual eye exam?  No  Who is the provider or what is the name of the office in which the patient attends annual eye exams?  If pt is not established with a provider, would they like to be referred to a provider to establish care? No .   Dental Screening: Recommended annual dental exams for proper oral hygiene  Community Resource Referral / Chronic Care Management: CRR required this visit?  No   CCM required this visit?  No      Plan:     I have personally reviewed and noted the following in the patient's chart:   Medical and social history Use of alcohol, tobacco or illicit drugs  Current medications and supplements including opioid prescriptions. Patient is not  currently taking opioid prescriptions. Functional ability and status Nutritional status Physical activity Advanced directives List of other physicians Hospitalizations, surgeries, and ER visits in previous 12 months Vitals Screenings to include cognitive, depression, and falls Referrals and appointments  In addition, I have reviewed and discussed with patient certain preventive protocols, quality metrics, and best practice recommendations. A written personalized care plan for preventive services as well as general preventive health recommendations were provided to patient.     Remi Haggard, LPN  08/14/2022   Nurse Notes:

## 2022-08-15 ENCOUNTER — Encounter: Payer: Self-pay | Admitting: *Deleted

## 2022-08-15 NOTE — Progress Notes (Signed)
Reviewed and agree with Dr Koval's plan.   

## 2022-08-19 ENCOUNTER — Other Ambulatory Visit: Payer: Self-pay

## 2022-08-19 DIAGNOSIS — E1165 Type 2 diabetes mellitus with hyperglycemia: Secondary | ICD-10-CM

## 2022-08-19 NOTE — Telephone Encounter (Signed)
[facility] sent over a fax informing the sig code for Semaglutide 1mg /Dose does not match the drug mediation. Please make the correct changes and [facility] on [email or web address]. Thank you! Penni Bombard CMA

## 2022-08-20 MED ORDER — SEMAGLUTIDE(0.25 OR 0.5MG/DOS) 2 MG/1.5ML ~~LOC~~ SOPN: 0.2500 mg | PEN_INJECTOR | SUBCUTANEOUS | 1 refills | Status: DC

## 2022-09-02 ENCOUNTER — Other Ambulatory Visit: Payer: Self-pay

## 2022-09-02 DIAGNOSIS — N401 Enlarged prostate with lower urinary tract symptoms: Secondary | ICD-10-CM

## 2022-09-02 DIAGNOSIS — M545 Low back pain, unspecified: Secondary | ICD-10-CM

## 2022-09-03 ENCOUNTER — Encounter: Payer: Self-pay | Admitting: Pharmacist

## 2022-09-03 ENCOUNTER — Ambulatory Visit (INDEPENDENT_AMBULATORY_CARE_PROVIDER_SITE_OTHER): Payer: 59 | Admitting: Pharmacist

## 2022-09-03 DIAGNOSIS — E1165 Type 2 diabetes mellitus with hyperglycemia: Secondary | ICD-10-CM

## 2022-09-03 DIAGNOSIS — E1140 Type 2 diabetes mellitus with diabetic neuropathy, unspecified: Secondary | ICD-10-CM

## 2022-09-03 DIAGNOSIS — N529 Male erectile dysfunction, unspecified: Secondary | ICD-10-CM

## 2022-09-03 DIAGNOSIS — Z794 Long term (current) use of insulin: Secondary | ICD-10-CM

## 2022-09-03 DIAGNOSIS — N401 Enlarged prostate with lower urinary tract symptoms: Secondary | ICD-10-CM

## 2022-09-03 MED ORDER — METFORMIN HCL ER 500 MG PO TB24
1000.0000 mg | ORAL_TABLET | Freq: Two times a day (BID) | ORAL | 3 refills | Status: DC
Start: 2022-09-03 — End: 2023-01-23

## 2022-09-03 MED ORDER — TRESIBA FLEXTOUCH 100 UNIT/ML ~~LOC~~ SOPN
12.0000 [IU] | PEN_INJECTOR | Freq: Every day | SUBCUTANEOUS | 2 refills | Status: AC
Start: 2022-09-03 — End: ?

## 2022-09-03 MED ORDER — TRAMADOL HCL 50 MG PO TABS: 50.0000 mg | ORAL_TABLET | Freq: Every day | ORAL | 0 refills | Status: DC | PRN

## 2022-09-03 MED ORDER — TAMSULOSIN HCL 0.4 MG PO CAPS: 0.4000 mg | ORAL_CAPSULE | Freq: Every day | ORAL | 0 refills | Status: DC

## 2022-09-03 MED ORDER — TAMSULOSIN HCL 0.4 MG PO CAPS
0.4000 mg | ORAL_CAPSULE | Freq: Every day | ORAL | 1 refills | Status: DC
Start: 2022-09-03 — End: 2022-11-19

## 2022-09-03 NOTE — Patient Instructions (Addendum)
It was nice to see you today!  Your goal blood sugar is 80-130 before eating and less than 180 after eating.  Medication Changes: Continue metformin 1000 mg (2 tablets) twice daily   Take one more dose of Ozempic 0.25 mg weekly, then increase to 0.5 mg weekly starting 09/12/22  Reduce your Lantus to 12 units daily. Once you run of this prescription, you can pick up Tresiba insulin from the pharmacy. Please take Tresiba at 12 units daily  Monitor blood sugars at home and keep a log (glucometer or piece of paper) to bring with you to your next visit.  Keep up the good work with diet and exercise. Aim for a diet full of vegetables, fruit and lean meats (chicken, [place], fish). Try to limit salt intake by eating fresh or frozen vegetables (instead of canned), rinse canned vegetables prior to cooking and do not add any additional salt to meals.   Sensor Application If using the App, you can tap Help in the Main Menu to access an in-app tutorial on applying a Sensor. See below for instructions on how to download the app. Apply Sensors only on the back of your upper arm. If placed in other areas, the Sensor may not function properly and could give you inaccurate readings. Avoid areas with scars, moles, stretch marks, or lumps.   Select an area of skin that generally stays flat during your normal daily activities (no bending or folding). Choose a site that is at least 1 inch (2.5 cm) away from any injection sites. To prevent discomfort or skin irritation, you should select a different site other than the one most recently used. Wash application site using a plain soap, dry, and then clean with an alcohol wipe. This will help remove any oily residue that may prevent the sensor from sticking properly. Allow site to air dry before proceeding. Note: The area MUST be clean and dry, or the Sensor may not stay on for the full wear duration specified by your Sensor insert. 4. Unscrew the cap from the Sensor  Applicator and set the cap aside.  5. Place the Sensor Applicator over the prepared site and push down firmly to apply the Sensor to your body. 6. Gently pull the Sensor Applicator away from your body. The Sensor should now be attached to your skin. 7. Make sure the Sensor is secure after application. Put the cap back on the Sensor Applicator. Discard the used Engineer, agricultural according to local regulations.  What If My Sensor Falls Off or What If My Sensor Isn't Working? Call [employer] Customer Care Team at [phone] Available 7 days a week from 8AM-8PM EST, excluding holidays If yo have multiple sensors fall off prior to 14 days of use, contact [facility] at [phone]   The App Download the FreeStyle Standard 3 App in your phone's app store   Load the app and select get started now Create an account  Tap scan new sensor Follow the prompts on the screen. If your sensor does not sync, try moving your phone slowly around the sensor. Phone cases may affect scanning. This will be the only time you have to scan the sensor until you apply a new sensor in 14 days.  There will be a 60 minute start up period until the app will display your glucose reading   How To Share Your Readings With [place] Once in the app, go to settings -> connected apps -> LibreView -> Enter Practice ID ->  [id number]

## 2022-09-03 NOTE — Progress Notes (Signed)
S:     Chief Complaint  Patient presents with   Medication Management    Diabetes - CGM  - Insulin   67 y.o. male who presents for diabetes evaluation, education, and management.   PMH is significant for T2DM Last seen and referred by Dr. Ardyth Harps on 08/07/22.   Today, patient arrives in good spirits and presents without assistance. Patient brings all medications to visit today. He has not been able to resolve the Apple ID issues yet in order to obtain the [employer] 3 phone app. Spoke with daughter over the phone today. She plans to resolve this prior to next office visit.   Patient reports Diabetes was diagnosed in 2000s.   Current diabetes medications include: Tresiba 14 units daily (taking Lantus 15 units), Jardiance (empagliflozin) 10 mg daily, metformin 1000 mg BID Current hypertension medications include: Losartan 50 mg daily, amlodipine 10 mg daily Current hyperlipidemia medications include: Atorvastatin 40 mg daily  Patient reports adherence to taking all medications as prescribed. He reports ~ 3 pens of Lantus left at home and has been using this until his next refill his due.  Insurance coverage: [employer]  Reported home BG readings: 100s, one rare reading in the 300s  Patient denies hypoglycemic events.  Patient reported dietary habits: Often eats traditional African foods. Carbohydrates include potatoes, rice, fruits  O:   Review of Systems  Musculoskeletal:  Positive for back pain.  All other systems reviewed and are negative.   Physical Exam Vitals reviewed.  Constitutional:      Appearance: Normal appearance. He is normal weight.  Pulmonary:     Effort: Pulmonary effort is normal.     Breath sounds: Normal breath sounds.  Musculoskeletal:     Right lower leg: No edema.     Left lower leg: No edema.  Neurological:     Mental Status: He is alert.  Psychiatric:        Mood and Affect: Mood normal.        Behavior: Behavior normal.         Thought Content: Thought content normal.        Judgment: Judgment normal.      Lab Results  Component Value Date   HGBA1C 9.8 (A) 08/07/2022   Vitals:   09/03/22 0933  BP: 128/81  Pulse: 73  SpO2: 99%    Lipid Panel     Component Value Date/Time   CHOL 94 (L) 04/17/2021 0925   TRIG 40 04/17/2021 0925   HDL 49 04/17/2021 0925   CHOLHDL 1.9 04/17/2021 0925   CHOLHDL 2.7 07/07/2014 1621   VLDL 10 07/07/2014 1621   LDLCALC 34 04/17/2021 0925    Clinical Atherosclerotic Cardiovascular Disease (ASCVD): No  The ASCVD Risk score (Arnett DK, et al., 2019) failed to calculate for the following reasons:   The valid total cholesterol range is 130 to 320 mg/dL   A/P: Diabetes longstanding currently controlled based on readings from his fingerstick meter. Patient is  able to verbalize appropriate hypoglycemia management plan. Medication adherence appears good. -Decreased dose of basal insulin Lantus (insulin glargine) 12 units daily. Will transition to [place] (insulin degludec) due to insurance issues with next refill.  -Increased GLP-1 Ozempic (semaglutide)to 0.5 mg weekly starting 09/12/22 -Continued SGLT2-I Jardiance (empagliflozin) 10 mg. -Continued metformin 1000 mg (2X500mg ) BID. Reordered this in the XR formulation. -Plans to set up on patient's iPhone when Apple ID issues resolved. Daughter confirmed she will work on this. -Patient educated  on purpose, proper use, and potential adverse effects of medications.  -Extensively discussed pathophysiology of diabetes, recommended lifestyle interventions, dietary effects on blood sugar control. Encouraged exercise with high carbohydrate meals. -Counseled on s/sx of and management of hypoglycemia.  -Next A1c anticipated August 2024.  ASCVD risk - primary prevention in patient with diabetes. Last LDL is 34 at goal of <70 mg/dL. -Continued atorvastatin 40 mg.   Hypertension longstanding currently near control. Systolic blood  pressure goal of <130 mmHg. Medication adherence good. -Continued amlodipine 10 mg, losartan 50 mg.  Refill requests handled with assistance of PCP - Dr. Melissa Noon.  (She briefly met with patient today) -Tramadol and tamsulosin  Written patient instructions provided. Patient verbalized understanding of treatment plan.  Total time in face to face counseling 27 minutes.    Follow-up:  Pharmacist on 10/08/22 Patient seen with Lily Peer, PharmD, PGY-1 resident.

## 2022-09-03 NOTE — Assessment & Plan Note (Signed)
Diabetes longstanding currently controlled based on readings from his fingerstick meter. Patient is  able to verbalize appropriate hypoglycemia management plan. Medication adherence appears good. -Decreased dose of basal insulin Lantus (insulin glargine) 12 units daily. Will transition to [place] (insulin degludec) due to insurance issues with next refill.  -Increased GLP-1 Ozempic (semaglutide)to 0.5 mg weekly starting 09/12/22 -Continued SGLT2-I Jardiance (empagliflozin) 10 mg. -Continued metformin 1000 mg (2X500mg ) BID. Reordered this in the XR formulation. -Plans to set up on patient's iPhone when Apple ID issues resolved. Daughter confirmed she will work on this. -Patient educated on purpose, proper use, and potential adverse effects of medications.  -Extensively discussed pathophysiology of diabetes, recommended lifestyle interventions, dietary effects on blood sugar control. Encouraged exercise with high carbohydrate meals. -Counseled on s/sx of and management of hypoglycemia.

## 2022-09-03 NOTE — Progress Notes (Signed)
Reviewed and agree with Dr Koval's plan.   

## 2022-09-05 ENCOUNTER — Other Ambulatory Visit: Payer: Self-pay | Admitting: Student

## 2022-09-05 DIAGNOSIS — M545 Low back pain, unspecified: Secondary | ICD-10-CM

## 2022-10-01 ENCOUNTER — Other Ambulatory Visit: Payer: Self-pay | Admitting: Student

## 2022-10-01 DIAGNOSIS — G8929 Other chronic pain: Secondary | ICD-10-CM

## 2022-10-03 NOTE — Telephone Encounter (Signed)
Patient returns call to nurse line regarding prescription.   He is requesting that prescription be sent to [facility] on [address].   Veronda Prude, RN

## 2022-10-08 ENCOUNTER — Encounter: Payer: Self-pay | Admitting: Pharmacist

## 2022-10-08 ENCOUNTER — Ambulatory Visit (INDEPENDENT_AMBULATORY_CARE_PROVIDER_SITE_OTHER): Payer: 59 | Admitting: Pharmacist

## 2022-10-08 VITALS — BP 136/80 | HR 81 | Ht 69.0 in | Wt 149.4 lb

## 2022-10-08 DIAGNOSIS — E1140 Type 2 diabetes mellitus with diabetic neuropathy, unspecified: Secondary | ICD-10-CM | POA: Diagnosis not present

## 2022-10-08 DIAGNOSIS — Z794 Long term (current) use of insulin: Secondary | ICD-10-CM

## 2022-10-08 MED ORDER — EMPAGLIFLOZIN 10 MG PO TABS: 10.0000 mg | ORAL_TABLET | Freq: Every day | ORAL | 3 refills | Status: DC

## 2022-10-08 MED ORDER — AMLODIPINE BESYLATE 10 MG PO TABS: ORAL_TABLET | ORAL | 3 refills | Status: DC

## 2022-10-08 MED ORDER — FREESTYLE LIBRE 3 SENSOR MISC
1.0000 [IU] | 11 refills | Status: DC
Start: 2022-10-08 — End: 2022-12-08

## 2022-10-08 NOTE — Progress Notes (Signed)
Reviewed and agree with Dr Koval's plan.   

## 2022-10-08 NOTE — Assessment & Plan Note (Signed)
Diabetes longstanding currently well controlled based on CGM which patient is enjoying. Patient is able to verbalize appropriate hypoglycemia management plan, denies any symptomatic episodes of low blood sugar.  Medication adherence appears good. -Continue Tresiba (insulin degludec) 12 units once daily.  -Continue GLP-1 Ozempic (semaglutide) 0.5 mg weekly  -Continued SGLT2-I Jardiance (empagliflozin) 10 mg. -Continued metformin 1000 mg (2X500mg ) BID.  -Patient educated on purpose, proper use, and potential adverse effects of medications.  -Extensively discussed pathophysiology of diabetes, recommended lifestyle interventions, dietary effects on blood sugar control. Encouraged exercise with high carbohydrate meals. -Next A1c anticipated August 2024.

## 2022-10-08 NOTE — Patient Instructions (Addendum)
It was nice to see you today!  Your goal blood sugar is 80-130 before eating and less than 180 after eating.  Medication Changes: Continue all medications the same at this time.  Your blood sugar increases with your diet.  Please continue to eat foods that are good for you.  Smaller portions of food that you know are raising your blood sugar.    Continue to enjoy your Libre3 - Refills sent for your sensors.   Keep up the good work with diet and exercise. Aim for a diet full of vegetables, fruit and lean meats (chicken, [place], fish). Try to limit salt intake by eating fresh or frozen vegetables (instead of canned), rinse canned vegetables prior to cooking and do not add any additional salt to meals.

## 2022-10-08 NOTE — Progress Notes (Signed)
S:     Chief Complaint  Patient presents with   Medication Management    Diabetes - Follow-up   67 y.o. male who presents for diabetes evaluation, education, and management.   PMH is significant for T2DM Last seen and referred by Dr. Ardyth Harps on 08/07/22.    Today, patient arrives in good spirits and presents without assistance. Patient brings all medications to visit today. He has resolved his CGM APP issue with his phone.     Patient reports Diabetes was diagnosed in 2000s.    Current diabetes medications include:  Tresiba 12 units daily, Jardiance (empagliflozin) 10 mg daily, metformin 1000 mg BID Current hypertension medications include: Losartan 50 mg daily, amlodipine 10 mg daily Current hyperlipidemia medications include: Atorvastatin 40 mg daily   Patient reports adherence to taking all medications as prescribed.    Insurance coverage: [employer]   Patient denies hypoglycemic events.   Patient reported dietary habits: Often eats traditional African foods. Carbohydrates include potatoes, rice, fruits, and notes FUFU has caused increase in blood sugar this AM.   Patient denies nocturia (nighttime urination).  Patient continues to have back pain and his neuropathy (nerve pain) is controlled with use of gabapentin and Tramadol.   O:   Review of Systems  All other systems reviewed and are negative.   Physical Exam Constitutional:      Appearance: He is normal weight.  Pulmonary:     Effort: Pulmonary effort is normal.  Neurological:     Mental Status: He is alert.  Psychiatric:        Mood and Affect: Mood normal.        Behavior: Behavior normal.        Thought Content: Thought content normal.        Judgment: Judgment normal.    Libre 3 CGM Download: today % Time CGM is active: 96% Average Glucose: 145 mg/dL Glucose Management Indicator: 6.8  Glucose Variability: 29.3% (goal <36%) Time in Goal:  - Time in range 70-180: 81% - Time above  range: 19% - Time below range: 0% Observed patterns:  Prandial excursion explained by meal intake.     Lab Results  Component Value Date   HGBA1C 9.8 (A) 08/07/2022   Vitals:   10/08/22 1330  BP: 136/80  Pulse: 81  SpO2: 100%    Lipid Panel     Component Value Date/Time   CHOL 94 (L) 04/17/2021 0925   TRIG 40 04/17/2021 0925   HDL 49 04/17/2021 0925   CHOLHDL 1.9 04/17/2021 0925   CHOLHDL 2.7 07/07/2014 1621   VLDL 10 07/07/2014 1621   LDLCALC 34 04/17/2021 0925    A/P: Diabetes longstanding currently well controlled based on CGM which patient is enjoying. Patient is able to verbalize appropriate hypoglycemia management plan, denies any symptomatic episodes of low blood sugar.  Medication adherence appears good. -Continue Tresiba (insulin degludec) 12 units once daily.  -Continue GLP-1 Ozempic (semaglutide) 0.5 mg weekly  -Continued SGLT2-I Jardiance (empagliflozin) 10 mg. -Continued metformin 1000 mg (2X500mg ) BID.  -Patient educated on purpose, proper use, and potential adverse effects of medications.  -Extensively discussed pathophysiology of diabetes, recommended lifestyle interventions, dietary effects on blood sugar control. Encouraged exercise with high carbohydrate meals. -Next A1c anticipated August 2024.   ASCVD risk - primary prevention in patient with diabetes. Last LDL is 34 at goal of <70 mg/dL. -Continued atorvastatin 40 mg.    Hypertension longstanding currently controlled. Medication adherence good. -Continued amlodipine 10 mg,  losartan 50 mg.  Written patient instructions provided. Patient verbalized understanding of treatment plan.   Total time in face to face counseling 27 minutes.    Follow-up:  Pharmacist PRN - 2-3 months from today. PCP clinic visit in 11/19/2022.  Patient seen with Adam Phenix, PharmD, PGY-1 Pharmacy Resident.

## 2022-10-20 ENCOUNTER — Telehealth: Payer: Self-pay | Admitting: Student

## 2022-10-20 NOTE — Telephone Encounter (Signed)
Patient came in stating that he needs refills for his Jardiance and Amlodipine please. [facility]

## 2022-10-21 ENCOUNTER — Other Ambulatory Visit: Payer: Self-pay | Admitting: Student

## 2022-10-21 MED ORDER — EMPAGLIFLOZIN 10 MG PO TABS: 10.0000 mg | ORAL_TABLET | Freq: Every day | ORAL | 3 refills | Status: DC

## 2022-10-21 MED ORDER — AMLODIPINE BESYLATE 10 MG PO TABS: ORAL_TABLET | ORAL | 3 refills | Status: DC

## 2022-10-21 NOTE — Progress Notes (Signed)
Jardiance and A[facility]odipine refilled [facility].

## 2022-11-04 ENCOUNTER — Other Ambulatory Visit: Payer: Self-pay | Admitting: Family Medicine

## 2022-11-04 DIAGNOSIS — E1140 Type 2 diabetes mellitus with diabetic neuropathy, unspecified: Secondary | ICD-10-CM

## 2022-11-06 ENCOUNTER — Other Ambulatory Visit: Payer: Self-pay | Admitting: Student

## 2022-11-06 DIAGNOSIS — M545 Low back pain, unspecified: Secondary | ICD-10-CM

## 2022-11-06 DIAGNOSIS — G8929 Other chronic pain: Secondary | ICD-10-CM

## 2022-11-07 ENCOUNTER — Telehealth: Payer: Self-pay | Admitting: Student

## 2022-11-07 NOTE — Telephone Encounter (Signed)
Patient came in stating that he needs a refill on his needles for his insulin. Also asking if he can get a refill sent in for omeprazole, stated that what he had got wet and he is unable to take it.

## 2022-11-10 ENCOUNTER — Telehealth: Payer: Self-pay | Admitting: Student

## 2022-11-10 ENCOUNTER — Other Ambulatory Visit: Payer: Self-pay | Admitting: Student

## 2022-11-10 DIAGNOSIS — K219 Gastro-esophageal reflux disease without esophagitis: Secondary | ICD-10-CM

## 2022-11-10 DIAGNOSIS — Z794 Long term (current) use of insulin: Secondary | ICD-10-CM

## 2022-11-10 MED ORDER — OMEPRAZOLE 40 MG PO CPDR
40.0000 mg | DELAYED_RELEASE_CAPSULE | Freq: Every day | ORAL | 1 refills | Status: DC
Start: 2022-11-10 — End: 2022-11-19

## 2022-11-10 NOTE — Telephone Encounter (Signed)
Patient came in stating that he went to pick up his Tramadol on Friday but was told that the doctor was not authorized to write the prescription.

## 2022-11-10 NOTE — Progress Notes (Unsigned)
Omeprazole refilled to pharmacy. Insulin already refilled.

## 2022-11-19 ENCOUNTER — Ambulatory Visit (INDEPENDENT_AMBULATORY_CARE_PROVIDER_SITE_OTHER): Payer: 59 | Admitting: Student

## 2022-11-19 ENCOUNTER — Encounter: Payer: Self-pay | Admitting: Student

## 2022-11-19 VITALS — BP 124/68 | HR 79 | Ht 69.0 in | Wt 149.2 lb

## 2022-11-19 DIAGNOSIS — N401 Enlarged prostate with lower urinary tract symptoms: Secondary | ICD-10-CM | POA: Diagnosis not present

## 2022-11-19 DIAGNOSIS — Z794 Long term (current) use of insulin: Secondary | ICD-10-CM

## 2022-11-19 DIAGNOSIS — N529 Male erectile dysfunction, unspecified: Secondary | ICD-10-CM

## 2022-11-19 DIAGNOSIS — K219 Gastro-esophageal reflux disease without esophagitis: Secondary | ICD-10-CM | POA: Diagnosis not present

## 2022-11-19 DIAGNOSIS — E1140 Type 2 diabetes mellitus with diabetic neuropathy, unspecified: Secondary | ICD-10-CM

## 2022-11-19 DIAGNOSIS — E1169 Type 2 diabetes mellitus with other specified complication: Secondary | ICD-10-CM | POA: Diagnosis not present

## 2022-11-19 DIAGNOSIS — R3912 Poor urinary stream: Secondary | ICD-10-CM

## 2022-11-19 LAB — POCT GLYCOSYLATED HEMOGLOBIN (HGB A1C): HbA1c, POC (controlled diabetic range): 7.1 % — AB (ref 0.0–7.0)

## 2022-11-19 MED ORDER — BD PEN NEEDLE MINI U/F 31G X 5 MM MISC
0 refills | Status: AC
Start: 2022-11-19 — End: ?

## 2022-11-19 MED ORDER — EMPAGLIFLOZIN 10 MG PO TABS: 10.0000 mg | ORAL_TABLET | Freq: Every day | ORAL | 3 refills | Status: DC

## 2022-11-19 MED ORDER — TAMSULOSIN HCL 0.4 MG PO CAPS
0.4000 mg | ORAL_CAPSULE | Freq: Every day | ORAL | 1 refills | Status: DC
Start: 2022-11-19 — End: 2023-07-28

## 2022-11-19 MED ORDER — TADALAFIL 10 MG PO TABS: 10.0000 mg | ORAL_TABLET | Freq: Every day | ORAL | 0 refills | Status: DC | PRN

## 2022-11-19 MED ORDER — OMEPRAZOLE 40 MG PO CPDR
40.0000 mg | DELAYED_RELEASE_CAPSULE | Freq: Every day | ORAL | 1 refills | Status: DC
Start: 2022-11-19 — End: 2023-07-01

## 2022-11-19 NOTE — Progress Notes (Signed)
    SUBJECTIVE:   CHIEF COMPLAINT / HPI:   Mitchell Page is a 67 year old male here to discuss hypoglycemic episodes.  Hypoglycemia in an insulin-using diabetic He is currently taking metformin 1000 mg twice daily, Jardiance 10 mg daily, Ozempic 2 mg weekly, and insulin glargine (Lantus) 12 units/day.  He says that he takes his Lantus "when he remembers" to take it, but is certain that he does take it every single day.  He just does not specify if he takes in the morning or nighttime.  He is waking up at 2, 3 and 4 AM with low blood sugars almost every single night.  I reviewed his glucometer which shows the following readings " August 30- CBG 67 at 2AM August 29- CBG 66, 68 at [facility] August 28- CBG 54-60s at 4 AM  His diet consists of high amount of carbohydrates-mainly food food, rice, beans.  Erectile dysfunction At the end of our visit, he wanted discussed ED.  He is having issues with getting an erection, does feel some pressure from his partner who is a bit younger than him. He is taking sildenafil 100 mg as needed for sexual intercourse. He says he took a "blue pill" from one of his friends and it works well for him.  PERTINENT  PMH / PSH: Reviewed  OBJECTIVE:   BP 124/68   Pulse 79   Ht 5\' 9"  (1.753 m)   Wt 149 lb 3.2 oz (67.7 kg)   SpO2 97%   BMI 22.03 kg/m   General: Pleasant, well-groomed, in good spirits, ambulates independently Cardiac: Regular rate and rhythm Respiratory: Normal work of breathing on room air Neuro: Speech clear and fluent, no focal deficits   ASSESSMENT/PLAN:   Type 2 diabetes mellitus with diabetic neuropathy, unspecified (HCC) Discussed my concern with hypoglycemia and that his low CBG levels can be very dangerous.  Patient understands.  A1c is also very well-controlled, 7.1% today.  He has a CGM on today which is working well for him. -Plan to discontinue Lantus (insulin glargine) 12 units daily.  If persistent hyperglycemia with  discontinuation, can consider adding back at a reduced dose -Continue GLP-1 Ozempic 0.5 mg weekly -Continue SGLT2 inhibitor Jardiance 10 mg daily -Continue metformin 1000 mg twice daily -Encouraged him to include protein, healthy fats if he is going to eat high amounts of carbohydrates to prevent blood sugar spikes  Erectile dysfunction Cialis 10 mg daily sent into pharmacy given he is not having good response to sildenafil.  Unsure of insurance coverage, but we will see. Discussed onset of action, and that the medication last for about 24-36 hours Emphasized he should not take Cialis and sildenafil together.  Sildenafil discontinued in the [facility].     Darral Dash, DO [facility]

## 2022-11-19 NOTE — Assessment & Plan Note (Addendum)
Discussed my concern with hypoglycemia and that his low CBG levels can be very dangerous.  Patient understands.  A1c is also very well-controlled, 7.1% today.  He has a CGM on today which is working well for him. -Plan to discontinue Lantus (insulin glargine) 12 units daily.  If persistent hyperglycemia with discontinuation, can consider adding back at a reduced dose -Continue GLP-1 Ozempic 0.5 mg weekly -Continue SGLT2 inhibitor Jardiance 10 mg daily -Continue metformin 1000 mg twice daily -Encouraged him to include protein, healthy fats if he is going to eat high amounts of carbohydrates to prevent blood sugar spikes

## 2022-11-19 NOTE — Patient Instructions (Addendum)
It was great seeing you today.  As we discussed, -STOP taking your insulin glargine (Lantus) pen. -Continue taking your other medications including metformin, Jardiance, Ozempic.  Please come back in 2 weeks so we can so how you are doing.   If you have any questions or concerns, please feel free to call the clinic.   Have a wonderful day,  Dr. Darral Dash [facility] [phone]

## 2022-11-19 NOTE — Assessment & Plan Note (Signed)
Cialis 10 mg daily sent into pharmacy given he is not having good response to sildenafil.  Unsure of insurance coverage, but we will see. Discussed onset of action, and that the medication last for about 24-36 hours Emphasized he should not take Cialis and sildenafil together.  Sildenafil discontinued in the [facility].

## 2022-12-04 ENCOUNTER — Telehealth: Payer: Self-pay | Admitting: Student

## 2022-12-04 NOTE — Telephone Encounter (Signed)
Patient walked in stating the pharmacy said the freestyle will cost 160.00 and the insurance will not cover it. The doctor need to call in a Rx.

## 2022-12-08 ENCOUNTER — Ambulatory Visit (INDEPENDENT_AMBULATORY_CARE_PROVIDER_SITE_OTHER): Payer: 59 | Admitting: Family Medicine

## 2022-12-08 ENCOUNTER — Encounter: Payer: Self-pay | Admitting: Family Medicine

## 2022-12-08 VITALS — BP 118/62 | HR 72 | Ht 69.0 in | Wt 142.0 lb

## 2022-12-08 DIAGNOSIS — Z794 Long term (current) use of insulin: Secondary | ICD-10-CM

## 2022-12-08 DIAGNOSIS — E1140 Type 2 diabetes mellitus with diabetic neuropathy, unspecified: Secondary | ICD-10-CM

## 2022-12-08 DIAGNOSIS — M545 Low back pain, unspecified: Secondary | ICD-10-CM | POA: Diagnosis not present

## 2022-12-08 DIAGNOSIS — G8929 Other chronic pain: Secondary | ICD-10-CM

## 2022-12-08 LAB — GLUCOSE, POCT (MANUAL RESULT ENTRY): POC Glucose: 201 mg/dl — AB (ref 70–99)

## 2022-12-08 MED ORDER — FREESTYLE LIBRE 3 SENSOR MISC
1.0000 [IU] | 11 refills | Status: DC
Start: 2022-12-08 — End: 2023-07-28

## 2022-12-08 MED ORDER — TRAMADOL HCL 50 MG PO TABS
50.0000 mg | ORAL_TABLET | Freq: Every day | ORAL | 0 refills | Status: DC | PRN
Start: 2022-12-08 — End: 2023-01-07

## 2022-12-08 NOTE — Progress Notes (Signed)
    SUBJECTIVE:   CHIEF COMPLAINT / HPI:   T2DM -Current medication regimen: Metformin 1000 mg twice daily, Jardiance 10 mg daily, Ozempic 0.5 mg weekly.  At last visit 9/4, his Lantus (12 units daily) was discontinued due to reports of hypoglycemia. -Has not had his CGM since last Wednesday due to pharmacy/cost issues.  Therefore has not been able to check his sugars and does not remember any readings -Since stopping insulin, has not been feeling symptoms of hypoglycemia -Denies polyuria, polydipsia, abdominal pain, chest pain, shortness of breath -Blood sugar in office today is 201  Lab Results  Component Value Date   HGBA1C 7.1 (A) 11/19/2022   HGBA1C 9.8 (A) 08/07/2022   HGBA1C 8.7 (H) 05/15/2022    Requesting refill of tramadol for low back pain  PERTINENT  PMH / PSH: Type 2 diabetes  OBJECTIVE:   BP 118/62   Pulse 72   Ht 5\' 9"  (1.753 m)   Wt 142 lb (64.4 kg)   SpO2 98%   BMI 20.97 kg/m    General: NAD, pleasant, able to participate in exam Respiratory: No respiratory distress Skin: warm and dry, no rashes noted Psych: Normal affect and mood  ASSESSMENT/PLAN:   Assessment & Plan Type 2 diabetes mellitus with diabetic neuropathy, with long-term current use of insulin (HCC) Nonfasting CBG 201 today.  Continue current regimen.  Refilled CGM and provided one in office today.  Encouraged to keep a close eye on CGM readings for the next week and follow-up in 1 week to revisit his diabetes regimen and evaluate for any changes as appropriate.  Discussed return precautions.    Vonna Drafts, MD [facility]

## 2022-12-08 NOTE — Assessment & Plan Note (Signed)
Nonfasting CBG 201 today.  Continue current regimen.  Refilled CGM and provided one in office today.  Encouraged to keep a close eye on CGM readings for the next week and follow-up in 1 week to revisit his diabetes regimen and evaluate for any changes as appropriate.  Discussed return precautions.

## 2022-12-08 NOTE — Patient Instructions (Signed)
Continue your current medications  Please monitor your fasting blood sugars every morning   Please follow up in about 1 week to check in

## 2022-12-08 NOTE — Progress Notes (Signed)
Pt had issues getting his freestyle libre 3 sensors.  Was told they were going to cost him $160 and he could not afford that.  I contacted pharmacy, charge is $0 but can't pick up until Wednesday at the earliest.   Provided with one sensor from Dr. Raymondo Band in the meantime. Jone Baseman, CMA

## 2022-12-15 ENCOUNTER — Ambulatory Visit (INDEPENDENT_AMBULATORY_CARE_PROVIDER_SITE_OTHER): Payer: 59 | Admitting: Family Medicine

## 2022-12-15 ENCOUNTER — Encounter: Payer: Self-pay | Admitting: Family Medicine

## 2022-12-15 VITALS — BP 105/61 | HR 89 | Ht 69.0 in | Wt 148.4 lb

## 2022-12-15 DIAGNOSIS — E1140 Type 2 diabetes mellitus with diabetic neuropathy, unspecified: Secondary | ICD-10-CM

## 2022-12-15 DIAGNOSIS — Z794 Long term (current) use of insulin: Secondary | ICD-10-CM

## 2022-12-15 DIAGNOSIS — N529 Male erectile dysfunction, unspecified: Secondary | ICD-10-CM

## 2022-12-15 DIAGNOSIS — Z23 Encounter for immunization: Secondary | ICD-10-CM | POA: Diagnosis not present

## 2022-12-15 MED ORDER — TADALAFIL 10 MG PO TABS
10.0000 mg | ORAL_TABLET | Freq: Every day | ORAL | 0 refills | Status: DC | PRN
Start: 2022-12-15 — End: 2023-01-23

## 2022-12-15 NOTE — Patient Instructions (Signed)
Please continue your current medications

## 2022-12-15 NOTE — Progress Notes (Signed)
    SUBJECTIVE:   CHIEF COMPLAINT / HPI:   T2DM -Current medication regimen: Metformin 1000 mg twice daily, Jardiance 10 mg daily, Ozempic 0.5 mg weekly.  At last visit 9/23 with continue current regimen.  Previously his Lantus (12 units daily) was discontinued due to reports of hypoglycemia.  At last visit, had not had his CGM due to cost issues but this was replaced at that visit.  Was advised to follow-up today for reevaluation of his diabetes regimen given CBG data -Home CBGs: fasting usually runs around 130, 1hr postprandial usually anywhere between 150-250. Last week had a level of 68 but this normalized after eating. -Denies polyuria, polydipsia, abdominal pain, chest pain, shortness of breath -Has not had additional hypoglycemic symptoms since stopping insulin  Lab Results  Component Value Date   HGBA1C 7.1 (A) 11/19/2022   HGBA1C 9.8 (A) 08/07/2022   HGBA1C 8.7 (H) 05/15/2022     PERTINENT  PMH / PSH: T2DM  OBJECTIVE:   BP 105/61   Pulse 89   Ht 5\' 9"  (1.753 m)   Wt 148 lb 6 oz (67.3 kg)   SpO2 98%   BMI 21.91 kg/m    General: NAD, pleasant, able to participate in exam Respiratory: No respiratory distress Skin: warm and dry, no rashes noted Psych: Normal affect and mood  ASSESSMENT/PLAN:   Assessment & Plan Type 2 diabetes mellitus with diabetic neuropathy, with long-term current use of insulin (HCC) Stable, CBGs within appropriate range.  No need to restart insulin at this time.  Continue current regimen.  Follow-up 3 months for A1c  Refilled Cialis for ED  Vonna Drafts, MD [facility]

## 2022-12-15 NOTE — Assessment & Plan Note (Signed)
Stable, CBGs within appropriate range.  No need to restart insulin at this time.  Continue current regimen.  Follow-up 3 months for A1c

## 2022-12-30 ENCOUNTER — Ambulatory Visit (HOSPITAL_COMMUNITY)
Admission: EM | Admit: 2022-12-30 | Discharge: 2022-12-30 | Disposition: A | Discharge status: Home / Self Care | Payer: 59 | Attending: Internal Medicine | Admitting: Internal Medicine

## 2022-12-30 ENCOUNTER — Encounter (HOSPITAL_COMMUNITY): Payer: Self-pay

## 2022-12-30 DIAGNOSIS — R112 Nausea with vomiting, unspecified: Secondary | ICD-10-CM | POA: Diagnosis not present

## 2022-12-30 DIAGNOSIS — R197 Diarrhea, unspecified: Secondary | ICD-10-CM | POA: Diagnosis not present

## 2022-12-30 NOTE — ED Provider Notes (Signed)
[facility]    CSN: [id number] Arrival date & time: 12/30/22  1538      History   Chief Complaint Chief Complaint  Patient presents with   Emesis    HPI Mitchell Page is a 67 y.o. male.   The history is provided by the patient.  Emesis Associated symptoms: diarrhea   Associated symptoms: no abdominal pain, no chills, no cough, no fever, no headaches and no sore throat   Multiple pisodes of nausea, vomiting and diarrhea yesterday, states had a lot of rumbling in his stomach.  Mitts fatigue and bodyaches.  Denies fever, chills, sweats, cough, chest pain, shortness of breath, bad food exposure, known contacts with similar symptoms, change in medication, recent travel.  He was able to eat today  Past Medical History:  Diagnosis Date   Allergic rhinitis    Chronic headache    Cough 04/21/2017   Diabetes mellitus    GERD (gastroesophageal reflux disease)    Hyperlipidemia    Hypertension    Illiteracy and low-level literacy    Unclear of his literacy  in his native tongue, however, he is unable to read in English   Lower back pain    w/ R LE sciatica s/p laminectomy in 2008. MRI- 04/2008    Patient Active Problem List   Diagnosis Date Noted   Migraine 08/08/2022   BPH (benign prostatic hyperplasia) 01/19/2017   Healthcare maintenance 11/09/2013   Erectile dysfunction 12/10/2012   PUD (peptic ulcer disease) 06/19/2010   Allergic rhinitis 09/11/2009   Essential hypertension 06/01/2009   Back pain 09/15/2008   Type 2 diabetes mellitus with diabetic neuropathy, unspecified (HCC) 09/16/1999    Past Surgical History:  Procedure Laterality Date   LUMBAR LAMINECTOMY  2008   l4-5   Repair of left arm fracture         Home Medications    Prior to Admission medications   Medication Sig Start Date End Date Taking? Authorizing Provider  amLODipine (NORVASC) 10 MG tablet TAKE 1 TABLET(10 MG) BY MOUTH AT BEDTIME 10/21/22   Dameron, Nolberto Hanlon, DO  atorvastatin  (LIPITOR) 40 MG tablet TAKE 1 TABLET(40 MG) BY MOUTH DAILY 08/28/21   Cathleen Corti, MD  Azelastine-Fluticasone 137-50 MCG/ACT SUSP Place 1 spray into the nose 2 (two) times daily. 08/07/22   Elberta Fortis, MD  Blood Glucose Monitoring Suppl DEVI 1 each by Does not apply route in the morning, at noon, and at bedtime. May substitute to any manufacturer covered by patient's insurance. Patient not taking: Reported on 09/03/2022 07/01/22   Elberta Fortis, MD  butalbital-acetaminophen-caffeine (FIORICET) [phone] MG tablet Take 1-2 tablets by mouth every 6 (six) hours as needed for headache.    [provider]  Continuous Glucose Sensor (FREESTYLE LIBRE 3 SENSOR) MISC 1 Units by Does not apply route once a week. Place 1 sensor on the skin every 14 days. Use to check glucose continuously 12/08/22   Vonna Drafts, MD  empagliflozin (JARDIANCE) 10 MG TABS tablet Take 1 tablet (10 mg total) by mouth daily. 11/19/22   Dameron, Nolberto Hanlon, DO  gabapentin (NEURONTIN) 300 MG capsule TAKE 1 CAPSULE (300 MG TOTAL) BY MOUTH 2 (TWO) TIMES DAILY. TAKE 1 CAPSULE(300 MG) BY MOUTH TWICE DAILY 11/04/22 02/02/23  Dameron, Nolberto Hanlon, DO  losartan (COZAAR) 50 MG tablet TAKE 1 TABLET BY MOUTH EVERY DAY 07/01/22   Elberta Fortis, MD  metFORMIN (GLUCOPHAGE-XR) 500 MG 24 hr tablet Take 2 tablets (1,000 mg total) by mouth 2 (two) times daily  with a meal. 09/03/22   McDiarmid, Leighton Roach, MD  Nutritional Supplements (BEE POLLEN-1000/ROYAL JELLY PO) Take 1,000 mg by mouth daily. Patient not taking: Reported on 12/15/2022    [provider]  omeprazole (PRILOSEC) 40 MG capsule Take 1 capsule (40 mg total) by mouth daily. 11/19/22   Dameron, Nolberto Hanlon, DO  Semaglutide,0.25 or 0.5MG /DOS, 2 MG/1.5ML SOPN Inject 0.25 mg into the skin once a week. 0.25 mg once weekly for 4 weeks then increase to 0.5 mg weekly for at least 4 weeks,max 1 mg Patient taking differently: Inject 0.5 mg into the skin once a week. 08/20/22   [name],  MD  tadalafil (CIALIS) 10 MG tablet Take 1 tablet (10 mg total) by mouth daily as needed for erectile dysfunction. 12/15/22   Vonna Drafts, MD  tamsulosin (FLOMAX) 0.4 MG CAPS capsule Take 1 capsule (0.4 mg total) by mouth daily. 11/19/22   Dameron, Nolberto Hanlon, DO  traMADol (ULTRAM) 50 MG tablet Take 1 tablet (50 mg total) by mouth daily as needed. 12/08/22   Vonna Drafts, MD    Family History Family History  Problem Relation Age of Onset   Heart disease Father     Social History Social History   Tobacco Use   Smoking status: Never   Smokeless tobacco: Never  Vaping Use   Vaping status: Never Used  Substance Use Topics   Alcohol use: No    Alcohol/week: 0.0 standard drinks of alcohol   Drug use: No     Allergies   Patient has no known allergies.   Review of Systems Review of Systems  Constitutional:  Positive for fatigue. Negative for chills, diaphoresis and fever.  HENT:  Negative for congestion, ear pain and sore throat.   Respiratory:  Negative for cough.   Cardiovascular:  Negative for chest pain.  Gastrointestinal:  Positive for diarrhea, nausea and vomiting. Negative for abdominal pain and blood in stool.  Genitourinary:  Negative for dysuria.  Neurological:  Positive for dizziness. Negative for headaches.     Physical Exam Triage Vital Signs ED Triage Vitals  Encounter Vitals Group     BP 12/30/22 1621 (!) 147/73     Systolic BP Percentile --      Diastolic BP Percentile --      Pulse Rate 12/30/22 1621 85     Resp 12/30/22 1621 16     Temp 12/30/22 1621 98.1 F (36.7 C)     Temp Source 12/30/22 1621 Oral     SpO2 12/30/22 1621 96 %     Weight 12/30/22 1618 148 lb (67.1 kg)     Height 12/30/22 1618 5\' 9"  (1.753 m)     Head Circumference --      Peak Flow --      Pain Score 12/30/22 1618 0     Pain Loc --      Pain Education --      Exclude from Growth Chart --    No data found.  Updated Vital Signs BP (!) 147/73 (BP Location: Left Arm)   Pulse 85    Temp 98.1 F (36.7 C) (Oral)   Resp 16   Ht 5\' 9"  (1.753 m)   Wt 148 lb (67.1 kg)   SpO2 96%   BMI 21.86 kg/m   Visual Acuity Right Eye Distance:   Left Eye Distance:   Bilateral Distance:    Right Eye Near:   Left Eye Near:    Bilateral Near:     Physical Exam  Vitals and nursing note reviewed.  Constitutional:      Appearance: He is not ill-appearing.  HENT:     Head: Normocephalic and atraumatic.     Right Ear: Tympanic membrane and ear canal normal.     Left Ear: Tympanic membrane and ear canal normal.     Mouth/Throat:     Mouth: Mucous membranes are moist.  Eyes:     Conjunctiva/sclera: Conjunctivae normal.  Cardiovascular:     Rate and Rhythm: Normal rate and regular rhythm.     Heart sounds: Normal heart sounds.  Pulmonary:     Effort: No respiratory distress.     Breath sounds: Normal breath sounds.  Abdominal:     General: Bowel sounds are normal.     Palpations: Abdomen is soft.     Tenderness: There is no abdominal tenderness. There is no guarding.  Skin:    General: Skin is warm and dry.  Neurological:     Mental Status: He is alert.      UC Treatments / Results  Labs (all labs ordered are listed, but only abnormal results are displayed) Labs Reviewed - No data to display  EKG   Radiology No results found.  Procedures Procedures (including critical care time)  Medications Ordered in UC Medications - No data to display  Initial Impression / Assessment and Plan / UC Course  I have reviewed the triage vital signs and the nursing notes.  Pertinent labs & imaging results that were available during my care of the patient were reviewed by me and considered in my medical decision making (see chart for details).     67 year old male with nausea vomiting and diarrhea x 1 day improving.  Tolerating p.o.  Mildly hypertensive otherwise vital signs are stable, afebrile, not tachycardic or tachypneic.  Abdominal exam is benign.  Home management  of vomiting and diarrhea reviewed with patient  Final Clinical Impressions(s) / UC Diagnoses   Final diagnoses:  Nausea vomiting and diarrhea     Discharge Instructions      Dietary management of diarrhea, drink plenty of fluids especially fluids containing electrolytes such as Pedialyte.  Follow-up for worsening symptoms or concerns     ED Prescriptions   None    PDMP not reviewed this encounter.   Meliton Rattan, [place] 12/30/22 1643

## 2022-12-30 NOTE — ED Triage Notes (Signed)
Patient here today with c/o diarrhea and nausea since Monday. He vomited on Monday night.

## 2022-12-30 NOTE — Discharge Instructions (Addendum)
Dietary management of diarrhea, drink plenty of fluids especially fluids containing electrolytes such as Pedialyte.  Follow-up for worsening symptoms or concerns

## 2023-01-07 ENCOUNTER — Other Ambulatory Visit: Payer: Self-pay | Admitting: Family Medicine

## 2023-01-07 DIAGNOSIS — G8929 Other chronic pain: Secondary | ICD-10-CM

## 2023-01-22 ENCOUNTER — Other Ambulatory Visit: Payer: Self-pay | Admitting: Student

## 2023-01-22 ENCOUNTER — Other Ambulatory Visit: Payer: Self-pay | Admitting: Family Medicine

## 2023-01-22 DIAGNOSIS — J301 Allergic rhinitis due to pollen: Secondary | ICD-10-CM

## 2023-01-22 DIAGNOSIS — N529 Male erectile dysfunction, unspecified: Secondary | ICD-10-CM

## 2023-01-22 DIAGNOSIS — I10 Essential (primary) hypertension: Secondary | ICD-10-CM

## 2023-01-22 DIAGNOSIS — Z794 Long term (current) use of insulin: Secondary | ICD-10-CM

## 2023-02-12 ENCOUNTER — Other Ambulatory Visit: Payer: Self-pay | Admitting: Student

## 2023-02-12 DIAGNOSIS — G8929 Other chronic pain: Secondary | ICD-10-CM

## 2023-02-21 IMAGING — CR DG LUMBAR SPINE 2-3V
2 series · 2 of 2 positions shown · non-contrast
Comparison: 04/30/2008

CLINICAL DATA: Low back pain

EXAM:
LUMBAR SPINE - 2-3 VIEW

[w l-spine a.p. *]
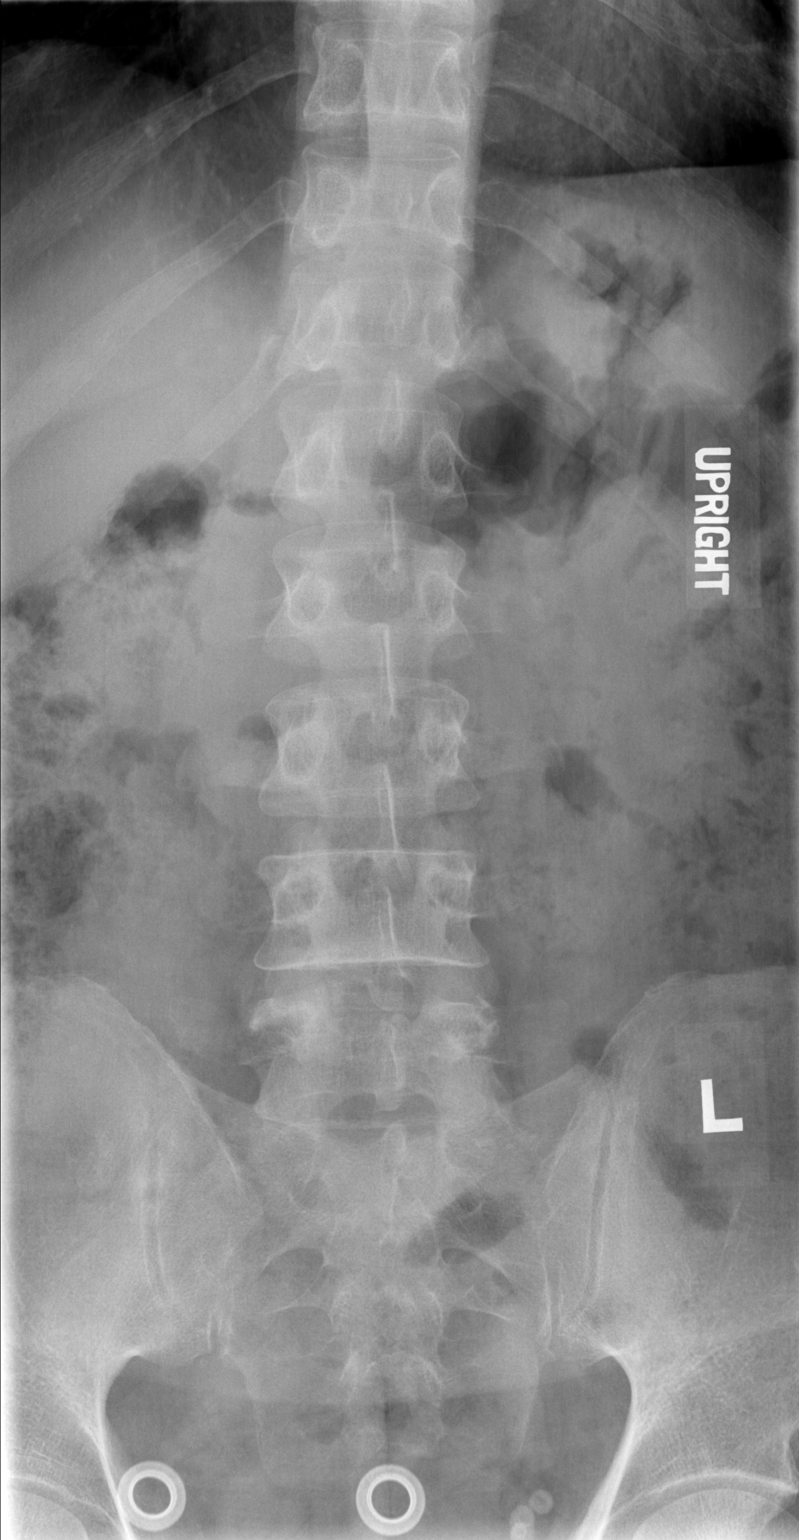

[w l-spine lat *]
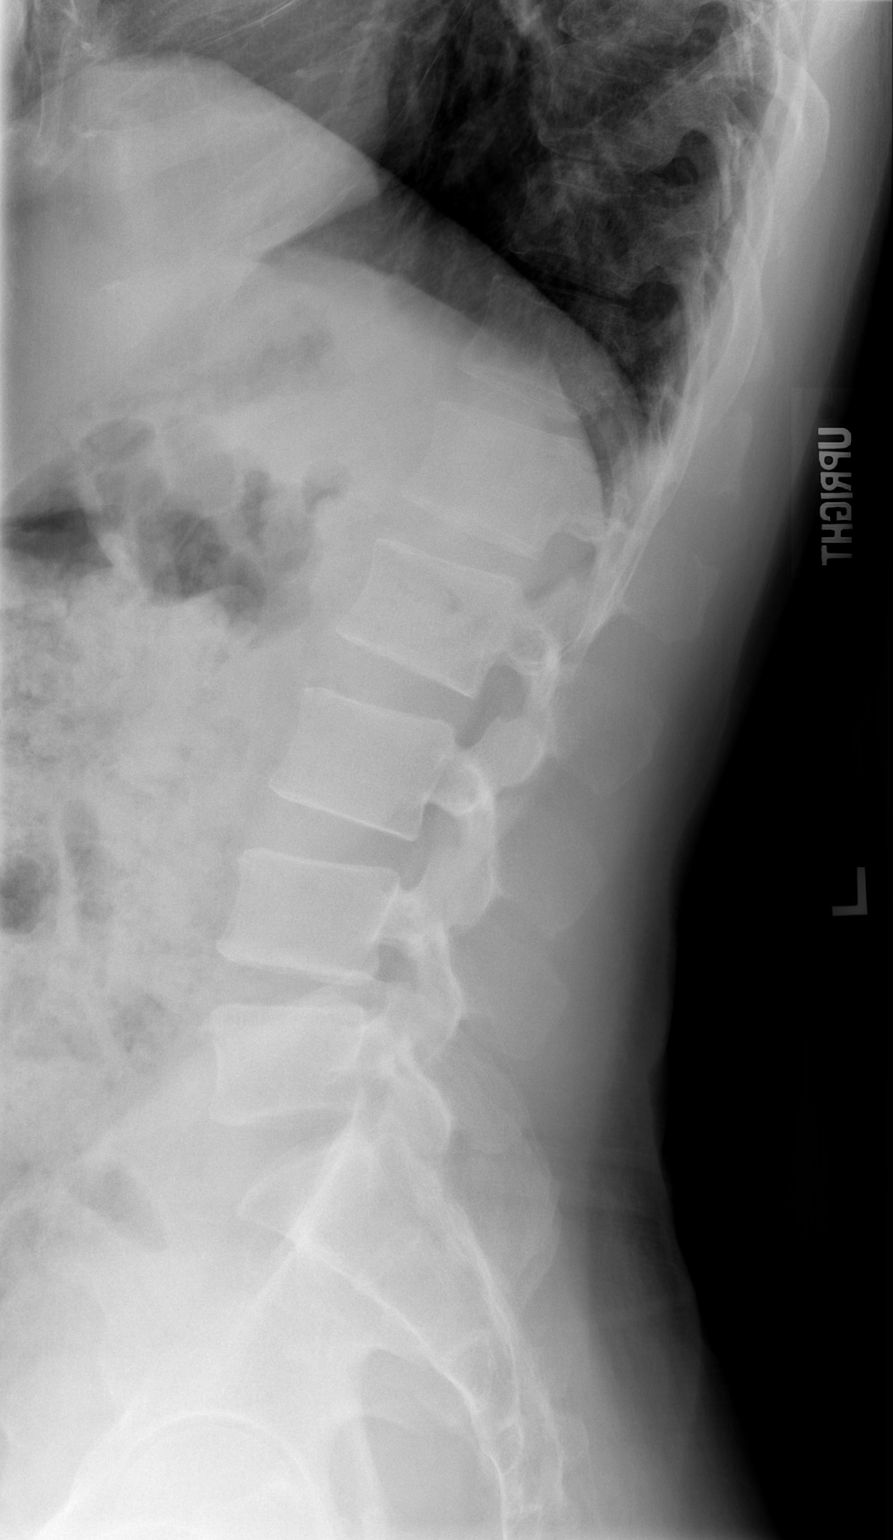

[2 of 2 positions shown; findings below may reference images not displayed]

FINDINGS: There is no evidence of lumbar spine fracture. Alignment is normal.
Intervertebral disc spaces are maintained. No advanced facet joint
arthropathy is evident.
IMPRESSION: Negative.

## 2023-03-01 ENCOUNTER — Other Ambulatory Visit: Payer: Self-pay | Admitting: Family Medicine

## 2023-03-01 DIAGNOSIS — E1165 Type 2 diabetes mellitus with hyperglycemia: Secondary | ICD-10-CM

## 2023-03-15 ENCOUNTER — Other Ambulatory Visit: Payer: Self-pay

## 2023-03-15 ENCOUNTER — Encounter (HOSPITAL_COMMUNITY): Payer: Self-pay | Admitting: Emergency Medicine

## 2023-03-15 ENCOUNTER — Emergency Department (HOSPITAL_COMMUNITY): Payer: 59

## 2023-03-15 ENCOUNTER — Emergency Department (HOSPITAL_COMMUNITY)
Admission: EM | Admit: 2023-03-15 | Discharge: 2023-03-16 | Disposition: A | Discharge status: Home / Self Care | Payer: 59 | Attending: Emergency Medicine | Admitting: Emergency Medicine

## 2023-03-15 DIAGNOSIS — R739 Hyperglycemia, unspecified: Secondary | ICD-10-CM

## 2023-03-15 DIAGNOSIS — Z7984 Long term (current) use of oral hypoglycemic drugs: Secondary | ICD-10-CM | POA: Diagnosis not present

## 2023-03-15 DIAGNOSIS — E1165 Type 2 diabetes mellitus with hyperglycemia: Secondary | ICD-10-CM | POA: Insufficient documentation

## 2023-03-15 DIAGNOSIS — E162 Hypoglycemia, unspecified: Secondary | ICD-10-CM | POA: Diagnosis present

## 2023-03-15 LAB — URINALYSIS, ROUTINE W REFLEX MICROSCOPIC
Bacteria, UA: NONE SEEN
Bilirubin Urine: NEGATIVE
Glucose, UA: >=500 mg/dL — AB
Hgb urine dipstick: NEGATIVE
Ketones, ur: NEGATIVE mg/dL
Leukocytes,Ua: NEGATIVE
Nitrite: NEGATIVE
Protein, ur: NEGATIVE mg/dL
Specific Gravity, Urine: 1.028 (ref 1.005–1.030)
pH: 5 (ref 5.0–8.0)

## 2023-03-15 LAB — I-STAT VENOUS BLOOD GAS, ED
Acid-base deficit: 1 mmol/L (ref 0.0–2.0)
Bicarbonate: 24.9 mmol/L (ref 20.0–28.0)
Calcium, Ion: 1.26 mmol/L (ref 1.15–1.40)
HCT: 46 % (ref 39.0–52.0)
Hemoglobin: 15.6 g/dL (ref 13.0–17.0)
O2 Saturation: 75 %
Potassium: 4.1 mmol/L (ref 3.5–5.1)
Sodium: 137 mmol/L (ref 135–145)
TCO2: 26 mmol/L (ref 22–32)
pCO2, Ven: 46.1 mm[Hg] (ref 44–60)
pH, Ven: 7.342 (ref 7.25–7.43)
pO2, Ven: 42 mm[Hg] (ref 32–45)

## 2023-03-15 LAB — COMPREHENSIVE METABOLIC PANEL
ALT: 22 U/L (ref 0–44)
AST: 21 U/L (ref 15–41)
Albumin: 3.6 g/dL (ref 3.5–5.0)
Alkaline Phosphatase: 45 U/L (ref 38–126)
Anion gap: 8 (ref 5–15)
BUN: 14 mg/dL (ref 8–23)
CO2: 22 mmol/L (ref 22–32)
Calcium: 9.4 mg/dL (ref 8.9–10.3)
Chloride: 103 mmol/L (ref 98–111)
Creatinine, Ser: 0.85 mg/dL (ref 0.61–1.24)
GFR, Estimated: >60 mL/min (ref >60)
Glucose, Bld: 265 mg/dL — ABNORMAL HIGH (ref 70–99)
Potassium: 4.2 mmol/L (ref 3.5–5.1)
Sodium: 133 mmol/L — ABNORMAL LOW (ref 135–145)
Total Bilirubin: 1.3 mg/dL — ABNORMAL HIGH (ref <1.2)
Total Protein: 7.2 g/dL (ref 6.5–8.1)

## 2023-03-15 LAB — CBC WITH DIFFERENTIAL/PLATELET
Abs Immature Granulocytes: 0.01 10*3/uL (ref 0.00–0.07)
Basophils Absolute: 0 10*3/uL (ref 0.0–0.1)
Basophils Relative: 1 %
Eosinophils Absolute: 0.2 10*3/uL (ref 0.0–0.5)
Eosinophils Relative: 3 %
HCT: 42.7 % (ref 39.0–52.0)
Hemoglobin: 15.5 g/dL (ref 13.0–17.0)
Immature Granulocytes: 0 %
Lymphocytes Relative: 40 %
Lymphs Abs: 2.4 10*3/uL (ref 0.7–4.0)
MCH: 29.3 pg (ref 26.0–34.0)
MCHC: 36.3 g/dL — ABNORMAL HIGH (ref 30.0–36.0)
MCV: 80.7 fL (ref 80.0–100.0)
Monocytes Absolute: 0.7 10*3/uL (ref 0.1–1.0)
Monocytes Relative: 11 %
Neutro Abs: 2.8 10*3/uL (ref 1.7–7.7)
Neutrophils Relative %: 45 %
Platelets: 214 10*3/uL (ref 150–400)
RBC: 5.29 MIL/uL (ref 4.22–5.81)
RDW: 13.4 % (ref 11.5–15.5)
WBC: 6.1 10*3/uL (ref 4.0–10.5)
nRBC: 0 % (ref 0.0–0.2)

## 2023-03-15 LAB — BETA-HYDROXYBUTYRIC ACID: Beta-Hydroxybutyric Acid: 0.19 mmol/L (ref 0.05–0.27)

## 2023-03-15 LAB — LIPASE, BLOOD: Lipase: 41 U/L (ref 11–51)

## 2023-03-15 LAB — CBG MONITORING, ED
Glucose-Capillary: 321 mg/dL — ABNORMAL HIGH (ref 70–99)
Glucose-Capillary: 203 mg/dL — ABNORMAL HIGH (ref 70–99)

## 2023-03-15 NOTE — ED Provider Triage Note (Cosign Needed)
Emergency Medicine Provider Triage Evaluation Note  Mitchell Page , a 67 y.o. male  was evaluated in triage.  Pt complains of "blood sugar being all over the place." Denies any increased thirst or hunger. No N/V/D. Reports abdominal pain with eating. Reports dizziness with standing.  Review of Systems  Positive: Abdominal pain Negative: N/V  Physical Exam  BP 134/74 (BP Location: Left Arm)   Pulse 78   Temp 98.4 F (36.9 C)   Resp 16   SpO2 98%  Gen:   Awake, no distress   Resp:  Normal effort  MSK:   Moves extremities without difficulty  Other:  No neurodeficits  Medical Decision Making  Medically screening exam initiated at 3:44 PM.  Appropriate orders placed.  Mitchell Page was informed that the remainder of the evaluation will be completed by another provider, this initial triage assessment does not replace that evaluation, and the importance of remaining in the ED until their evaluation is complete.    Pete Pelt, [place] 03/15/23 1546

## 2023-03-15 NOTE — ED Triage Notes (Signed)
Pt states he has had been having blood sugar issues.  States he will eat and then his sugar gets high then it drops to about 60.  CBG in the 300s at time of triage.

## 2023-03-15 NOTE — ED Notes (Signed)
Pt stated that an hour ago, his personal glucometer read 114 and he ate a bag of chips.

## 2023-03-16 ENCOUNTER — Ambulatory Visit: Payer: 59 | Admitting: Student

## 2023-03-16 LAB — CBG MONITORING, ED: Glucose-Capillary: 207 mg/dL — ABNORMAL HIGH (ref 70–99)

## 2023-03-16 NOTE — ED Provider Notes (Signed)
[place] EMERGENCY DEPARTMENT AT [facility] Provider Note   CSN: [id number] Arrival date & time: 03/15/23  1443     History  Chief Complaint  Patient presents with   Hypoglycemia    Mitchell Page is a 67 y.o. male.  The history is provided by the patient.  Hypoglycemia Mitchell Page is a 67 y.o. male who presents to the Emergency Department complaining of hypoglycemia.  He has a history of diabetes and takes metformin, Jardiance and Lantus.  He states that since Friday his blood sugars have been fluctuating more than usual.  His sugar will range from 400 down to 60.  He overall feels well but he does get dizzy when his blood sugar is low.  He has not changed his medications.  He takes his Lantus in the morning, 12 units.  He was previously prescribed Ozempic, but he stopped taking it due to issues with low blood sugar.  He reports eating well.  No fever, chest pain, nausea, vomiting, diarrhea, wounds or sores.  No dysuria.      Home Medications Prior to Admission medications   Medication Sig Start Date End Date Taking? Authorizing Provider  amLODipine (NORVASC) 10 MG tablet TAKE 1 TABLET(10 MG) BY MOUTH AT BEDTIME 10/21/22   Dameron, Nolberto Hanlon, DO  atorvastatin (LIPITOR) 40 MG tablet TAKE 1 TABLET(40 MG) BY MOUTH DAILY 08/28/21   Cathleen Corti, MD  Azelastine-Fluticasone 137-50 MCG/ACT SUSP PLACE 1 SPRAY INTO THE NOSE 2 (TWO) TIMES DAILY. 01/23/23   Dameron, Nolberto Hanlon, DO  Blood Glucose Monitoring Suppl DEVI 1 each by Does not apply route in the morning, at noon, and at bedtime. May substitute to any manufacturer covered by patient's insurance. Patient not taking: Reported on 09/03/2022 07/01/22   Elberta Fortis, MD  butalbital-acetaminophen-caffeine (FIORICET) [phone] MG tablet Take 1-2 tablets by mouth every 6 (six) hours as needed for headache.    [provider]  Continuous Glucose Sensor (FREESTYLE LIBRE 3 SENSOR) MISC 1 Units by Does not apply route  once a week. Place 1 sensor on the skin every 14 days. Use to check glucose continuously 12/08/22   Vonna Drafts, MD  empagliflozin (JARDIANCE) 10 MG TABS tablet Take 1 tablet (10 mg total) by mouth daily. 11/19/22   Dameron, Nolberto Hanlon, DO  gabapentin (NEURONTIN) 300 MG capsule TAKE 1 CAPSULE (300 MG TOTAL) BY MOUTH 2 TIMES DAILY. TAKE 1 CAPSULE(300 MG) BY MOUTH TWICE DAILY 01/23/23   Dameron, Nolberto Hanlon, DO  losartan (COZAAR) 50 MG tablet TAKE 1 TABLET BY MOUTH EVERY DAY 01/23/23   Dameron, Nolberto Hanlon, DO  metFORMIN (GLUCOPHAGE-XR) 500 MG 24 hr tablet TAKE 2 TABLETS (1,000 MG TOTAL) BY MOUTH 2 (TWO) TIMES DAILY WITH A MEAL. 01/23/23   Dameron, Nolberto Hanlon, DO  Nutritional Supplements (BEE POLLEN-1000/ROYAL JELLY PO) Take 1,000 mg by mouth daily. Patient not taking: Reported on 12/15/2022    [provider]  omeprazole (PRILOSEC) 40 MG capsule Take 1 capsule (40 mg total) by mouth daily. 11/19/22   Dameron, Nolberto Hanlon, DO  Semaglutide,0.25 or 0.5MG /DOS, 2 MG/1.5ML SOPN Inject 0.25 mg into the skin once a week. 0.25 mg once weekly for 4 weeks then increase to 0.5 mg weekly for at least 4 weeks,max 1 mg Patient taking differently: Inject 0.5 mg into the skin once a week. 08/20/22   Mitchell Martinez, MD  tadalafil (CIALIS) 10 MG tablet TAKE 1 TABLET BY MOUTH DAILY AS NEEDED FOR ERECTILE DYSFUNCTION 01/23/23   Dameron, Nolberto Hanlon, DO  tamsulosin (FLOMAX) 0.4 MG CAPS  capsule Take 1 capsule (0.4 mg total) by mouth daily. 11/19/22   Dameron, Nolberto Hanlon, DO  traMADol (ULTRAM) 50 MG tablet TAKE 1 TABLET BY MOUTH EVERY DAY AS NEEDED 02/16/23   Darral Dash, DO      Allergies    Patient has no known allergies.    Review of Systems   Review of Systems  All other systems reviewed and are negative.   Physical Exam Updated Vital Signs BP (!) 149/80   Pulse 78   Temp 97.8 F (36.6 C)   Resp 17   SpO2 98%  Physical Exam Vitals and nursing note reviewed.  Constitutional:      Appearance: He is well-developed.  HENT:     Head:  Normocephalic and atraumatic.  Cardiovascular:     Rate and Rhythm: Normal rate and regular rhythm.     Heart sounds: No murmur heard. Pulmonary:     Effort: Pulmonary effort is normal. No respiratory distress.     Breath sounds: Normal breath sounds.  Abdominal:     Palpations: Abdomen is soft.     Tenderness: There is no abdominal tenderness. There is no guarding or rebound.  Musculoskeletal:        General: No tenderness.     Comments: 2+ DP pulses bilaterally.  No foot ulcers.  Skin:    General: Skin is warm and dry.  Neurological:     Mental Status: He is alert and oriented to person, place, and time.  Psychiatric:        Behavior: Behavior normal.     ED Results / Procedures / Treatments   Labs (all labs ordered are listed, but only abnormal results are displayed) Labs Reviewed  CBC WITH DIFFERENTIAL/PLATELET - Abnormal; Notable for the following components:      Result Value   MCHC 36.3 (*)    All other components within normal limits  COMPREHENSIVE METABOLIC PANEL - Abnormal; Notable for the following components:   Sodium 133 (*)    Glucose, Bld 265 (*)    Total Bilirubin 1.3 (*)    All other components within normal limits  URINALYSIS, ROUTINE W REFLEX MICROSCOPIC - Abnormal; Notable for the following components:   Color, Urine STRAW (*)    Glucose, UA >=500 (*)    All other components within normal limits  CBG MONITORING, ED - Abnormal; Notable for the following components:   Glucose-Capillary 321 (*)    All other components within normal limits  CBG MONITORING, ED - Abnormal; Notable for the following components:   Glucose-Capillary 203 (*)    All other components within normal limits  CBG MONITORING, ED - Abnormal; Notable for the following components:   Glucose-Capillary 207 (*)    All other components within normal limits  BETA-HYDROXYBUTYRIC ACID  LIPASE, BLOOD  BLOOD GAS, VENOUS  I-STAT VENOUS BLOOD GAS, ED    EKG None  Radiology DG Chest 2  View Result Date: 03/15/2023 CLINICAL DATA:  hyperglycemia, check for infection. EXAM: CHEST - 2 VIEW COMPARISON:  08/23/2017. FINDINGS: Bilateral lung fields are clear. Bilateral costophrenic angles are clear. Normal cardio-mediastinal silhouette. No acute osseous abnormalities. The soft tissues are within normal limits. IMPRESSION: No active cardiopulmonary disease. Electronically Signed   By: Jules Schick M.D.   On: 03/15/2023 16:58    Procedures Procedures    Medications Ordered in ED Medications - No data to display  ED Course/ Medical Decision Making/ A&P  Medical Decision Making  Patient here for evaluation of transient hypoglycemia.  He is hyperglycemic in the emergency department without any evidence of AKI, acute infectious process or DKA.  Discussed with patient dietary options for diabetes with decreasing his carbohydrate intake as he does eat high carb foods.  Also discussed changing his Lantus to at night.  Discussed PCP follow-up and return precautions.        Final Clinical Impression(s) / ED Diagnoses Final diagnoses:  Hyperglycemia    Rx / DC Orders ED Discharge Orders     None         Tilden Fossa, MD 03/16/23 [phone]

## 2023-03-23 ENCOUNTER — Ambulatory Visit: Payer: 59

## 2023-03-26 ENCOUNTER — Ambulatory Visit (INDEPENDENT_AMBULATORY_CARE_PROVIDER_SITE_OTHER): Payer: 59 | Admitting: Student

## 2023-03-26 VITALS — BP 140/80 | HR 68 | Temp 98.1°F | Ht 69.0 in | Wt 152.6 lb

## 2023-03-26 DIAGNOSIS — Z794 Long term (current) use of insulin: Secondary | ICD-10-CM

## 2023-03-26 DIAGNOSIS — E1140 Type 2 diabetes mellitus with diabetic neuropathy, unspecified: Secondary | ICD-10-CM

## 2023-03-26 LAB — POCT GLYCOSYLATED HEMOGLOBIN (HGB A1C): HbA1c, POC (controlled diabetic range): 9 % — AB (ref 0.0–7.0)

## 2023-03-26 LAB — GLUCOSE, POCT (MANUAL RESULT ENTRY): POC Glucose: 149 mg/dL — AB (ref 70–99)

## 2023-03-26 NOTE — Patient Instructions (Signed)
 It was great seeing you today.  As we discussed, Continue taking the following: Metformin  1000 mg twice daily  Ozempic  0.25 mg weekly  Jardiance  10 mg daily I will see you at our next scheduled visit. If your arm glucose meter reads a sugar < 70, please verify with a finger stick glucose check.    If you have any questions or concerns, please feel free to call the clinic.   Have a wonderful day,  Dr. Barabara Dama [facility] [phone]

## 2023-03-26 NOTE — Progress Notes (Signed)
    SUBJECTIVE:   CHIEF COMPLAINT / HPI:   Mitchell Page is a 68 year-old male here to discuss T2DM.  T2DM: -Prescribed metformin  1000 mg BID, Jardiance  10 mg daily, Ozempic  0.5 mg weekly - Reports CBG alerts awaken him at night but he denies any symptoms of hypoglycemia including confusion, weakness, clammy skin, nausea. - Says he is taking his Lantus  12 u daily, although this has been discontinued in the [facility]. He did not take his Ozempic  the past 2 weeks because he was worried about his low blood sugar. -On review of his CGM freestyle libre 3, it does show that he has having CBGs from 62-69 randomly at 3 AM or 4 AM.  The alarm awakens him, but he remains asymptomatic as noted above.  His CBGs remain in the 150-250s the remainder of the time.  PERTINENT  PMH / PSH: Hypertension Type 2 diabetes mellitus Erectile dysfunction  OBJECTIVE:   BP (!) 140/80   Pulse 68   Temp 98.1 F (36.7 C)   Ht 5' 9 (1.753 m)   Wt 152 lb 9.6 oz (69.2 kg)   SpO2 96%   BMI 22.54 kg/m   General: NAD, well appearing Cardiac: RRR Neuro: A&O Respiratory: normal WOB on RA. No wheezing or crackles on auscultation, good lung sounds throughout Extremities: Moving all 4 extremities equally    ASSESSMENT/PLAN:   Type 2 diabetes mellitus with diabetic neuropathy, unspecified (HCC) A1c 9.0% today. Plan to continue metformin  1000 mg twice daily, Jardiance  10 mg daily and to continue Ozempic  0.5 mg weekly. Patient will resume the Ozempic  and discard his Lantus . We can also increase his Jardiance  to 25 mg daily if needed.    Regarding the alerts that he is having hypoglycemia on his freestyle libre, I believe these are most likely inaccurate readings and they are compression lows when he is sleeping at night.  Especially since he remains asymptomatic. I discussed signs and symptoms of hypoglycemia.  Follow-up at next scheduled visit in April 21, 2023.    Banyan Goodchild, DO [facility]

## 2023-03-26 NOTE — Assessment & Plan Note (Signed)
 A1c 9.0% today. Plan to continue metformin  1000 mg twice daily, Jardiance  10 mg daily and to continue Ozempic  0.5 mg weekly. Patient will resume the Ozempic  and discard his Lantus . We can also increase his Jardiance  to 25 mg daily if needed.    Regarding the alerts that he is having hypoglycemia on his freestyle libre, I believe these are most likely inaccurate readings and they are compression lows when he is sleeping at night.  Especially since he remains asymptomatic. I discussed signs and symptoms of hypoglycemia.  Follow-up at next scheduled visit in April 21, 2023.

## 2023-04-09 ENCOUNTER — Other Ambulatory Visit: Payer: Self-pay

## 2023-04-09 DIAGNOSIS — G8929 Other chronic pain: Secondary | ICD-10-CM

## 2023-04-10 MED ORDER — TRAMADOL HCL 50 MG PO TABS: 50.0000 mg | ORAL_TABLET | Freq: Every day | ORAL | 0 refills | Status: DC | PRN

## 2023-04-13 ENCOUNTER — Other Ambulatory Visit: Payer: Self-pay | Admitting: Family Medicine

## 2023-04-13 DIAGNOSIS — E1165 Type 2 diabetes mellitus with hyperglycemia: Secondary | ICD-10-CM

## 2023-04-21 ENCOUNTER — Ambulatory Visit: Payer: Self-pay | Admitting: Student

## 2023-04-30 ENCOUNTER — Ambulatory Visit: Payer: 59 | Admitting: Student

## 2023-05-12 ENCOUNTER — Ambulatory Visit (INDEPENDENT_AMBULATORY_CARE_PROVIDER_SITE_OTHER): Payer: 59 | Admitting: Family Medicine

## 2023-05-12 VITALS — BP 112/64 | HR 75 | Ht 69.0 in | Wt 150.1 lb

## 2023-05-12 DIAGNOSIS — G8929 Other chronic pain: Secondary | ICD-10-CM | POA: Diagnosis not present

## 2023-05-12 DIAGNOSIS — M549 Dorsalgia, unspecified: Secondary | ICD-10-CM

## 2023-05-12 MED ORDER — TIZANIDINE HCL 4 MG PO TABS: 4.0000 mg | ORAL_TABLET | Freq: Three times a day (TID) | ORAL | 0 refills | Status: AC

## 2023-05-12 MED ORDER — MELOXICAM 15 MG PO TABS: 15.0000 mg | ORAL_TABLET | Freq: Every day | ORAL | 0 refills | Status: AC

## 2023-05-12 NOTE — Patient Instructions (Addendum)
 It was great to see you! Thank you for allowing me to participate in your care!  Our plans for today:  - Please take the below medications over the next week to help with your pain:  -Tylenol 1000mg  three times daily  -Tizanidine 4mg  three times daily  -Meloxicam 15mg  once daily -This should control your pain. It should start to improved over the next week. -Lets see how you are doing in 1 week. Please schedule an appointment.   Please arrive 15 minutes PRIOR to your next scheduled appointment time! If you do not, this affects OTHER patients' care.  Take care and seek immediate care sooner if you develop any concerns.   Celine Mans, MD, PGY-2 [facility] 11:19 AM 05/12/2023  [facility]

## 2023-05-12 NOTE — Progress Notes (Signed)
    SUBJECTIVE:   CHIEF COMPLAINT / HPI: back pain  Back surgery 2007. Was moving this weekend. Lifting lots of boxes. Has used Tylenol 500mg  and Taking 200mg  Alleve which has not helped.  Pain is located on both sides of lower back. No radiation down legs. No numbness or tingling. Takes Tramadol once daily in the morning. No bladder or bowel incontinence. No falls.  PERTINENT  PMH / PSH: Lumbar Laminectomy 2008  OBJECTIVE:   BP 112/64   Pulse 75   Ht 5\' 9"  (1.753 m)   Wt 150 lb 2 oz (68.1 kg)   SpO2 97%   BMI 22.17 kg/m   General: NAD, well appearing Neuro: A&O Respiratory: normal WOB on RA Extremities: Moving all 4 extremities equally Back Inspection: No obvious deformity, erythema, swelling, ecchymoses. Skin tag present on right. Surgical scar. Palpation: diffusely tender to palpation along paraspinal lumbar muscles ROM: severely decreased due to pain in all directions Special Tests: Positive straight leg raise bilaterally Strength: strength 5/5 bilateral lumbosacral plexus distribution   ASSESSMENT/PLAN:   Assessment & Plan Acute on chronic back pain Acute on chronic lumbar paraspinal muscle/sciatic flare. Likely additional component of lumbar OA, given hx of laminectomy. Low concern for fracture without trauma or herniated disc with out new neurologic signs. Consider lumbar xray and prednisone burst if not improving. Will treat acute flare with multimodal pain regimen below: -Tylenol 1000mg  three times daily -Tizanidine 4mg  three times daily -Meloxicam 15mg  once daily -Follow-up 1 week  Return in about 1 week (around 05/19/2023).  Celine Mans, MD [facility]

## 2023-05-26 ENCOUNTER — Telehealth: Payer: Self-pay | Admitting: Student

## 2023-05-27 ENCOUNTER — Other Ambulatory Visit: Payer: Self-pay

## 2023-05-27 DIAGNOSIS — G8929 Other chronic pain: Secondary | ICD-10-CM

## 2023-05-27 MED ORDER — TRAMADOL HCL 50 MG PO TABS: 50.0000 mg | ORAL_TABLET | Freq: Every day | ORAL | 0 refills | Status: DC | PRN

## 2023-06-29 ENCOUNTER — Other Ambulatory Visit: Payer: Self-pay | Admitting: Student

## 2023-06-29 DIAGNOSIS — K219 Gastro-esophageal reflux disease without esophagitis: Secondary | ICD-10-CM

## 2023-06-30 ENCOUNTER — Telehealth: Payer: Self-pay | Admitting: Student

## 2023-06-30 LAB — OPHTHALMOLOGY REPORT-SCANNED

## 2023-06-30 NOTE — Telephone Encounter (Signed)
 Patient came in asking for a refill on his Tramadol, he is out.

## 2023-07-01 ENCOUNTER — Other Ambulatory Visit: Payer: Self-pay | Admitting: Student

## 2023-07-01 DIAGNOSIS — M545 Low back pain, unspecified: Secondary | ICD-10-CM

## 2023-07-01 MED ORDER — TRAMADOL HCL 50 MG PO TABS: 50.0000 mg | ORAL_TABLET | Freq: Every day | ORAL | 0 refills | Status: DC | PRN

## 2023-07-01 NOTE — Progress Notes (Signed)
 Tramadol, 1 tablet daily PRN #30 tablets filled. PDMP reviewed.

## 2023-07-09 ENCOUNTER — Encounter: Payer: Self-pay | Admitting: Student

## 2023-07-09 ENCOUNTER — Ambulatory Visit (INDEPENDENT_AMBULATORY_CARE_PROVIDER_SITE_OTHER): Admitting: Student

## 2023-07-09 VITALS — BP 141/70 | HR 94 | Ht 69.0 in | Wt 141.4 lb

## 2023-07-09 DIAGNOSIS — E1140 Type 2 diabetes mellitus with diabetic neuropathy, unspecified: Secondary | ICD-10-CM

## 2023-07-09 DIAGNOSIS — Z794 Long term (current) use of insulin: Secondary | ICD-10-CM

## 2023-07-09 LAB — POCT GLYCOSYLATED HEMOGLOBIN (HGB A1C): HbA1c, POC (controlled diabetic range): 11.5 % — AB (ref 0.0–7.0)

## 2023-07-09 MED ORDER — METFORMIN HCL ER 500 MG PO TB24: 1000.0000 mg | ORAL_TABLET | Freq: Two times a day (BID) | ORAL | 1 refills | Status: DC

## 2023-07-09 MED ORDER — AMLODIPINE BESYLATE 10 MG PO TABS: ORAL_TABLET | ORAL | 3 refills | Status: AC

## 2023-07-09 MED ORDER — INSULIN GLARGINE 100 UNIT/ML SOLOSTAR PEN: 10.0000 [IU] | PEN_INJECTOR | SUBCUTANEOUS | 11 refills | Status: DC

## 2023-07-09 NOTE — Progress Notes (Signed)
    SUBJECTIVE:   CHIEF COMPLAINT / HPI:   T2DM follow up Feels ill on Ozempic , he says it takes his appetite away Does not want to keep taking it  Current diabetes medications include: Ozempic  0.5 mg weekly, Jardiance  (empagliflozin ) 10 mg daily, metformin  1000 mg BID   He eats a lot of high carbohydrate foods (Fufu, corn flour)  He cannot eat the food over here because he does not like it. No nausea, vomiting. Urinating the same as usual.   OBJECTIVE:   BP (!) 141/70   Pulse 94   Ht 5\' 9"  (1.753 m)   Wt 141 lb 6.4 oz (64.1 kg)   SpO2 98%   BMI 20.88 kg/m   General: NAD, well appearing, thin Cardiac: RRR Neuro: A&O Respiratory: normal WOB on RA. No wheezing or crackles on auscultation, good lung sounds throughout Extremities: Moving all 4 extremities equally  ASSESSMENT/PLAN:   Type 2 diabetes mellitus with diabetic neuropathy, unspecified (HCC) A1c 11.5% today, uncontrolled. Taking ARB and statin. Continue Metformin  1000 mg BID, Jardiance  10 mg daily D/c ozempic , patient feels weak/severely reduced appetite (unsure if this is the cause given he has been on same dose of 0.5 mg weekly long-term). Likely could be feeling these symptoms due to hyperglycemia No worry for DKA today- generally well appearing Start Lantus  10 units daily, increase by 1 unit per day until morning blood sugar is 150 or less Discussed signs and symptoms of hypoglycemia Return in 2 weeks for follow-up with Dr. Koval, 1 month follow up with me     Vallorie Gayer, DO [place]

## 2023-07-09 NOTE — Assessment & Plan Note (Signed)
 A1c 11.5% today, uncontrolled. Continue Metformin  1000 mg BID, Jardiance  10 mg daily D/c ozempic , patient feels weak/severely reduced appetite Start Lantus  10 units daily, increase by 1 unit per day until morning blood sugar is 150 or less Discussed signs and symptoms of hypoglycemia Return in 2 weeks for follow-up

## 2023-07-09 NOTE — Patient Instructions (Addendum)
 It was great seeing you today.  As we discussed, - Start taking Lantus  (insulin  glargine) 10 units daily. Increase by 1 unit daily until your morning blood sugar is 150 or less - STOP taking Ozempic  - Return in 2 weeks to see Dr. Koval at your scheduled appointment time   If you have any questions or concerns, please feel free to call the clinic.   Have a wonderful day,  Dr. Vallorie Gayer [facility] [phone]

## 2023-07-11 ENCOUNTER — Other Ambulatory Visit: Payer: Self-pay | Admitting: Student

## 2023-07-11 DIAGNOSIS — J301 Allergic rhinitis due to pollen: Secondary | ICD-10-CM

## 2023-07-23 ENCOUNTER — Ambulatory Visit: Admitting: Pharmacist

## 2023-07-28 ENCOUNTER — Ambulatory Visit: Admitting: Pharmacist

## 2023-07-28 ENCOUNTER — Encounter: Payer: Self-pay | Admitting: Pharmacist

## 2023-07-28 VITALS — BP 132/63 | HR 80 | Wt 147.8 lb

## 2023-07-28 DIAGNOSIS — J301 Allergic rhinitis due to pollen: Secondary | ICD-10-CM

## 2023-07-28 DIAGNOSIS — I10 Essential (primary) hypertension: Secondary | ICD-10-CM | POA: Diagnosis not present

## 2023-07-28 DIAGNOSIS — Z794 Long term (current) use of insulin: Secondary | ICD-10-CM

## 2023-07-28 DIAGNOSIS — E1140 Type 2 diabetes mellitus with diabetic neuropathy, unspecified: Secondary | ICD-10-CM | POA: Diagnosis not present

## 2023-07-28 DIAGNOSIS — N529 Male erectile dysfunction, unspecified: Secondary | ICD-10-CM | POA: Diagnosis not present

## 2023-07-28 MED ORDER — TADALAFIL 10 MG PO TABS: 10.0000 mg | ORAL_TABLET | Freq: Every day | ORAL | 3 refills | Status: DC

## 2023-07-28 MED ORDER — TAMSULOSIN HCL 0.4 MG PO CAPS
0.8000 mg | ORAL_CAPSULE | Freq: Every day | ORAL | 3 refills | Status: AC
Start: 2023-07-28 — End: ?

## 2023-07-28 MED ORDER — METFORMIN HCL 1000 MG PO TABS: 1000.0000 mg | ORAL_TABLET | Freq: Two times a day (BID) | ORAL | 3 refills | Status: AC

## 2023-07-28 MED ORDER — FREESTYLE LIBRE 3 PLUS SENSOR MISC: 11 refills | Status: DC

## 2023-07-28 MED ORDER — TRESIBA FLEXTOUCH 100 UNIT/ML ~~LOC~~ SOPN: 14.0000 [IU] | PEN_INJECTOR | Freq: Every day | SUBCUTANEOUS | 2 refills | Status: DC

## 2023-07-28 MED ORDER — AZELASTINE-FLUTICASONE 137-50 MCG/ACT NA SUSP: 1.0000 | Freq: Two times a day (BID) | NASAL | 11 refills | Status: DC

## 2023-07-28 NOTE — Patient Instructions (Addendum)
 It was nice to see you today!  Your goal blood sugar is 80-130 before eating and less than 180 after eating.  Medication Changes: START taking Tresiba  (insulin  degludec) inject 14 units subcutaneously every day.   START taking Cialis  (tadalafil ) one tablet every day to help you pee.   START taking 2 capsules of Flomax  (tamsulosin ) every day.   START taking metformin  1,000 mg one tablet 2 times a day.   Continue all other medication the same. I have sent refills of your medications and your FreeStyle Sensors to [facility].   Keep up the good work with diet and exercise. Aim for a diet full of vegetables, fruit and lean meats (chicken, [place], fish). Try to limit salt intake by eating fresh or frozen vegetables (instead of canned), rinse canned vegetables prior to cooking and do not add any additional salt to meals.

## 2023-07-28 NOTE — Assessment & Plan Note (Signed)
 Diabetes longstanding since 2001 currently uncontrolled. Patient is able to verbalize appropriate hypoglycemia management plan. Medication adherence appears good. Control is suboptimal due to variation in long-acting insulin  dosage. Change Tresiba  (insulin  degludec) dose to 14 units once daily every day. ReStart using FreeStyle Libre 3+ sensors to check BG. Sent prescription to pharmacy for additional sensors. Continue taking metformin  1,000 mg BID and Jardiance  (empagliflozin ) daily.  -Counseled on s/sx of and management of hypoglycemia.

## 2023-07-28 NOTE — Assessment & Plan Note (Signed)
 Hypertension longstanding since 2011 currently close to controlled. In-clinic reading of 132/63 is close to goal of <130/80 mmHg. Medication adherence appears good. Continue amlodipine  10 mg daily and losartan  50 mg daily.

## 2023-07-28 NOTE — Progress Notes (Signed)
 S:     Chief Complaint  Patient presents with   Medication Management    Diabetes follow-up   68 y.o. male who presents for diabetes evaluation, education, and management. Patient arrives in good spirits and presents without any assistance.   Patient was referred and last seen by Primary Care Provider, Dr. Thomas Fleischer, on 07/09/23. At last visit, Ozempic  was discontinued due to patient's unwanted weight loss and lack of appetite.   PMH is significant for HTN, HLD, BPH, ED.  Patient reports Diabetes was diagnosed in 2001.   Current diabetes medications include: Tresiba  (insulin  degludec) 12 - 15 units daily, metformin  1,000 mg BID, Jardiance  (empagliflozin ) 10 mg daily.  Current hypertension medications include: Amlodipine  10 mg daily, losartan  50 mg daily.  Current hyperlipidemia medications include: Atorvastatin  40 mg daily.   Patient reports adherence to taking all medications as prescribed. Patient reports using tadalafil  PRN for ED, and reports taking additional doses (2 capsules) of tamsulosin  on most days to help with BPH. Patient requested switch to sildenafil  for ED.   Do you feel that your medications are working for you? Yes Have you been experiencing any side effects to the medications prescribed? no Do you have any problems obtaining medications due to transportation or finances? no Insurance coverage: [employer]   Patient denies hypoglycemic events. Patient was using glucometer to check BG due to dislike of alarms from CGM. After some discussion of benefits for sugar control, was willing to re-start CGM today.   Patient reports nocturia (nighttime urination).  Patient reports neuropathy (nerve pain). Improves with use of gabapentin  300 mg BID.   Patient requested that provider fill out form for handicap parking pass.   O:   Review of Systems  All other systems reviewed and are negative.   Physical Exam Vitals reviewed.  Constitutional:      Appearance:  Normal appearance. He is normal weight.  Pulmonary:     Effort: Pulmonary effort is normal.  Neurological:     Mental Status: He is alert.  Psychiatric:        Mood and Affect: Mood normal.        Behavior: Behavior normal.        Thought Content: Thought content normal.        Judgment: Judgment normal.     Lab Results  Component Value Date   HGBA1C 11.5 (A) 07/09/2023   Vitals:   07/28/23 1640  BP: 132/63  Pulse: 80  SpO2: 99%    Lipid Panel     Component Value Date/Time   CHOL 94 (L) 04/17/2021 0925   TRIG 40 04/17/2021 0925   HDL 49 04/17/2021 0925   CHOLHDL 1.9 04/17/2021 0925   CHOLHDL 2.7 07/07/2014 1621   VLDL 10 07/07/2014 1621   LDLCALC 34 04/17/2021 0925    Clinical Atherosclerotic Cardiovascular Disease (ASCVD): No  The ASCVD Risk score (Arnett DK, et al., 2019) failed to calculate for the following reasons:   The valid total cholesterol range is 130 to 320 mg/dL    A/P: Diabetes longstanding since 2001 currently uncontrolled. Patient is able to verbalize appropriate hypoglycemia management plan. Medication adherence appears good. Control is suboptimal due to variation in long-acting insulin  dosage. Change Tresiba  (insulin  degludec) dose to 14 units once daily every day. ReStart using FreeStyle Libre 3+ sensors to check BG. Sent prescription to pharmacy for additional sensors. Continue taking metformin  1,000 mg BID and Jardiance  (empagliflozin ) daily.  -Counseled on s/sx of and management of  hypoglycemia.  -Next A1c anticipated August.   ASCVD risk - primary prevention in patient with diabetes. Last LDL is 34 not at goal of <70 mg/dL. ASCVD risk factors include T2DM, HTN. high intensity statin indicated. Continue atorvastatin  40 mg daily.   Hypertension longstanding since 2011 currently close to controlled. In-clinic reading of 132/63 is close to goal of <130/80 mmHg. Medication adherence appears good. Continue amlodipine  10 mg daily and losartan  50 mg  daily.   BPH appears uncontrolled due to trouble voiding and incomplete urination. Since patient reports taking 2 capsules of tamsulosin  0.4 mg daily, instructed to take 2 capsules (tamsulosin  0.8 mg) daily. Also counseled patient on using tadalafil  every day for BPH in place of PRN for ED.  -Reassess BPH control and consideration for changes at next PCP visit.   Provided refills for Tresiba , metformin , FreeStyle, tadalafil , tamsulosin , and azelastine /fluticasone . Written patient instructions provided, and patient was instructed on how to receive parking pass form. Verbalized understanding of treatment plan.  Total time in face to face counseling 48 minutes.    Follow-up:  Pharmacist on 6/3.  PCP clinic visit TBD Patient seen with Noah [place], PharmD Candidate.

## 2023-07-29 NOTE — Progress Notes (Signed)
 Reviewed and agree with Dr Macky Lower plan.

## 2023-07-30 ENCOUNTER — Telehealth: Payer: Self-pay

## 2023-07-30 NOTE — Telephone Encounter (Addendum)
 Form given to Koval during pharmacy OV.  Form filled out by myself and placed in PCP box for review.

## 2023-08-04 ENCOUNTER — Other Ambulatory Visit: Payer: Self-pay | Admitting: Student

## 2023-08-04 DIAGNOSIS — G8929 Other chronic pain: Secondary | ICD-10-CM

## 2023-08-06 ENCOUNTER — Other Ambulatory Visit: Payer: Self-pay | Admitting: Student

## 2023-08-06 DIAGNOSIS — N401 Enlarged prostate with lower urinary tract symptoms: Secondary | ICD-10-CM

## 2023-08-11 NOTE — Telephone Encounter (Signed)
 Form placed up front for pick up.   Copy made for batch scanning.   Attempted to call patient, however no answer.   Please let him know his DMV form is ready for pick up.

## 2023-08-18 ENCOUNTER — Ambulatory Visit: Admitting: Pharmacist

## 2023-08-23 ENCOUNTER — Other Ambulatory Visit: Payer: Self-pay | Admitting: Family Medicine

## 2023-08-23 DIAGNOSIS — E1140 Type 2 diabetes mellitus with diabetic neuropathy, unspecified: Secondary | ICD-10-CM

## 2023-08-31 ENCOUNTER — Other Ambulatory Visit: Payer: Self-pay | Admitting: Student

## 2023-08-31 DIAGNOSIS — E1165 Type 2 diabetes mellitus with hyperglycemia: Secondary | ICD-10-CM

## 2023-09-01 ENCOUNTER — Encounter: Payer: Self-pay | Admitting: Pharmacist

## 2023-09-01 ENCOUNTER — Ambulatory Visit (INDEPENDENT_AMBULATORY_CARE_PROVIDER_SITE_OTHER): Admitting: Pharmacist

## 2023-09-01 VITALS — BP 142/76 | HR 77 | Wt 146.6 lb

## 2023-09-01 DIAGNOSIS — Z794 Long term (current) use of insulin: Secondary | ICD-10-CM | POA: Diagnosis not present

## 2023-09-01 DIAGNOSIS — N401 Enlarged prostate with lower urinary tract symptoms: Secondary | ICD-10-CM | POA: Diagnosis not present

## 2023-09-01 DIAGNOSIS — E1140 Type 2 diabetes mellitus with diabetic neuropathy, unspecified: Secondary | ICD-10-CM

## 2023-09-01 DIAGNOSIS — R3912 Poor urinary stream: Secondary | ICD-10-CM

## 2023-09-01 MED ORDER — SILDENAFIL CITRATE 100 MG PO TABS: 100.0000 mg | ORAL_TABLET | Freq: Every day | ORAL | 4 refills | Status: DC | PRN

## 2023-09-01 MED ORDER — INSULIN PEN NEEDLE 29G X 4MM MISC: 1.0000 | 11 refills | Status: DC | PRN

## 2023-09-01 MED ORDER — INSULIN GLARGINE-YFGN 100 UNIT/ML ~~LOC~~ SOLN: 16.0000 [IU] | Freq: Every day | SUBCUTANEOUS | Status: DC

## 2023-09-01 NOTE — Assessment & Plan Note (Signed)
 Diabetes longstanding since 2001 currently with improved control with use of insulin  therapy. Patient is able to verbalize appropriate hypoglycemia management plan. Medication adherence appears good. Control remains suboptimal due to inadequate long-acting insulin  dosage.  - Change insulin  glargine from 14 to 16 units once daily  - Continue to use FreeStyle Libre 3+ sensors to check BG. S - Continue taking metformin  1,000 mg BID and Jardiance  (empagliflozin ) daily.  -Counseled on s/sx of and management of hypoglycemia.

## 2023-09-01 NOTE — Progress Notes (Signed)
 S:     Chief Complaint  Patient presents with   Medication Management    Diabetes - Hypertension   68 y.o. male who presents for diabetes evaluation, education, and management. Patient arrives in good spirits and presents without any assistance.    Patient was referred and last seen by Primary Care Provider, Dr. Thomas Fleischer, on 07/09/2023.  At last visit with me we discussed options for BPH, as well as diabetes.   Patient reports Diabetes was diagnosed in 2001.   Current diabetes medications include: insulin  glargine 14 units daily, metformin  1,000 mg BID, Jardiance  (empagliflozin ) 10 mg daily.  Current hypertension medications include: Amlodipine  10 mg daily, losartan  50 mg daily.  Current hyperlipidemia medications include: Atorvastatin  40 mg daily.  BPH medications:  tamsulosin  0.8mg  (2X0.4mg ) - NOT taking cialis  (tadalfil) due to cost he did not purchase.  Patient reports adherence to taking all medications as prescribed, other than Cialis  (tadalfil).  Patient did bring all medications and has a small number of sildenafil  which he uses PRN erectile dysfunction. He requested additional refill of tramadol , I shared that I would forward request to Dr. Thomas Fleischer.   Do you feel that your medications are working for you? Yes - states urine flow and hesitency is much improved with increase in tamsulosin  dose.  Have you been experiencing any side effects to the medications prescribed? no  Patient denies hypoglycemic events.  Patient reports decreased nocturia (nighttime urination) with glucose control and increase in tamsulosin  dose to 0.8mg  daily.    O:   Review of Systems  Genitourinary:  Negative for frequency and urgency.  All other systems reviewed and are negative.   Physical Exam Vitals reviewed.  Constitutional:      Appearance: He is normal weight.  Pulmonary:     Effort: Pulmonary effort is normal.   Neurological:     Mental Status: He is alert.   Psychiatric:         Mood and Affect: Mood normal.        Behavior: Behavior normal.        Thought Content: Thought content normal.        Judgment: Judgment normal.    Libre3 CGM Download today 09/01/2023 % Time CGM is active: 22% (last few days) Average Glucose: 181 mg/dL Glucose Management Indicator: N/A  ~ 8 Glucose Variability: 23%% (goal <36%) Time in Goal:  - Time in range 70-180: 55% - Time above range: 45% with 29% in the 180-250 range  - Time below range: 0% Observed patterns: Higher PM values - around meals  Lab Results  Component Value Date   HGBA1C 11.5 (A) 07/09/2023   Vitals:   09/01/23 1512 09/01/23 1515  BP: (!) 150/72 (!) 142/76  Pulse:  77  SpO2:  98%    Lipid Panel     Component Value Date/Time   CHOL 94 (L) 04/17/2021 0925   TRIG 40 04/17/2021 0925   HDL 49 04/17/2021 0925   CHOLHDL 1.9 04/17/2021 0925   CHOLHDL 2.7 07/07/2014 1621   VLDL 10 07/07/2014 1621   LDLCALC 34 04/17/2021 0925   Patient is participating in a Managed Medicaid Plan:  Yes   A/P: Diabetes longstanding since 2001 currently with improved control with use of insulin  therapy. Patient is able to verbalize appropriate hypoglycemia management plan. Medication adherence appears good. Control remains suboptimal due to inadequate long-acting insulin  dosage.  - Change insulin  glargine from 14 to 16 units once daily  - Continue to use  FreeStyle Libre 3+ sensors to check BG. S - Continue taking metformin  1,000 mg BID and Jardiance  (empagliflozin ) daily.  -Counseled on s/sx of and management of hypoglycemia.    BPH improved control with improved initiation of voiding and more complete urination.  - Continue higher dose tamsulosin  - 2 capsules (tamsulosin  0.8 mg) daily  Patient will use PRN sildenafil  and not tadalafil  due to cost.  Patient is satisfied with efficacy of sildenafil .  - Refilled prn sildenafil  per patient request.    REQUESTED pcp to refill tramadol  if appropriate.   ritten patient  instructions provided. Patient verbalized understanding of treatment plan.  Total time in face to face counseling 27 minutes.    Follow-up:  Pharmacist 1 month PCP clinic visit in TBD

## 2023-09-01 NOTE — Patient Instructions (Addendum)
 It was nice to see you today!  Your goal blood sugar is 80-130 before eating and less than 180 after eating.  Medication Changes: Increase Insulin  dose from 14 to 16 units once daily.  Continue all other medication the same.   Keep up the good work with diet and exercise. Aim for a diet full of vegetables, fruit and lean meats (chicken, [place], fish). Try to limit salt intake by eating fresh or frozen vegetables (instead of canned), rinse canned vegetables prior to cooking and do not add any additional salt to meals.

## 2023-09-01 NOTE — Assessment & Plan Note (Signed)
 BPH improved control with improved initiation of voiding and more complete urination.  - Continue higher dose tamsulosin  - 2 capsules (tamsulosin  0.8 mg) daily

## 2023-09-02 NOTE — Progress Notes (Signed)
 Reviewed and agree with Dr Macky Lower plan.

## 2023-09-03 ENCOUNTER — Other Ambulatory Visit: Payer: Self-pay

## 2023-09-03 DIAGNOSIS — G8929 Other chronic pain: Secondary | ICD-10-CM

## 2023-09-03 MED ORDER — TRAMADOL HCL 50 MG PO TABS: 50.0000 mg | ORAL_TABLET | Freq: Every day | ORAL | 0 refills | Status: DC | PRN

## 2023-09-04 ENCOUNTER — Other Ambulatory Visit: Payer: Self-pay | Admitting: Student

## 2023-09-04 ENCOUNTER — Other Ambulatory Visit: Payer: Self-pay | Admitting: Family Medicine

## 2023-09-04 DIAGNOSIS — E1165 Type 2 diabetes mellitus with hyperglycemia: Secondary | ICD-10-CM

## 2023-09-04 DIAGNOSIS — E1140 Type 2 diabetes mellitus with diabetic neuropathy, unspecified: Secondary | ICD-10-CM

## 2023-09-04 DIAGNOSIS — I10 Essential (primary) hypertension: Secondary | ICD-10-CM

## 2023-10-07 ENCOUNTER — Telehealth: Payer: Self-pay | Admitting: Family Medicine

## 2023-10-07 DIAGNOSIS — G8929 Other chronic pain: Secondary | ICD-10-CM

## 2023-10-07 NOTE — Telephone Encounter (Signed)
 Patient came in for refill on his Tramadol  his insurance is only paying for his 7 day supply but needs for a whole month. Is there anyway we could help him get a month supply?

## 2023-10-07 NOTE — Telephone Encounter (Signed)
 Dr. Koval will you be able to address this concern of  a 30 supply of Tramadol , when patient sees you at the next visit?  Cena JONELLE Pesa, CMA

## 2023-10-08 NOTE — Telephone Encounter (Signed)
 Patient calls nurse line in regards to Tramadol  prescription.   I called the pharmacy. The patient is needing a refill on this.   Will forward to PCP.

## 2023-10-09 ENCOUNTER — Ambulatory Visit: Admitting: Pharmacist

## 2023-10-09 ENCOUNTER — Encounter: Payer: Self-pay | Admitting: Pharmacist

## 2023-10-09 VITALS — BP 124/62 | HR 74 | Wt 147.2 lb

## 2023-10-09 DIAGNOSIS — E1165 Type 2 diabetes mellitus with hyperglycemia: Secondary | ICD-10-CM

## 2023-10-09 DIAGNOSIS — E1140 Type 2 diabetes mellitus with diabetic neuropathy, unspecified: Secondary | ICD-10-CM | POA: Diagnosis not present

## 2023-10-09 DIAGNOSIS — Z794 Long term (current) use of insulin: Secondary | ICD-10-CM | POA: Diagnosis not present

## 2023-10-09 MED ORDER — ATORVASTATIN CALCIUM 40 MG PO TABS: ORAL_TABLET | ORAL | 3 refills | Status: AC

## 2023-10-09 MED ORDER — INSULIN PEN NEEDLE 29G X 4MM MISC: 1.0000 | 11 refills | Status: DC | PRN

## 2023-10-09 MED ORDER — TRAMADOL HCL 50 MG PO TABS: 50.0000 mg | ORAL_TABLET | Freq: Every day | ORAL | 0 refills | Status: DC | PRN

## 2023-10-09 MED ORDER — INSULIN GLARGINE-YFGN 100 UNIT/ML ~~LOC~~ SOLN: 18.0000 [IU] | Freq: Every day | SUBCUTANEOUS | Status: DC

## 2023-10-09 NOTE — Progress Notes (Signed)
 S:     Chief Complaint  Patient presents with   Medication Management    Diabetes - CGM    68 y.o. male who presents for diabetes evaluation, education, and management. Patient arrives in good spirits and presents without any assistance. Patient arrives will all medication in his bag.   Patient was referred and last seen by Primary Care Provider, Dr. Damerone, on 07/09/2023.  At last visit with me 09/01/2023 we adjusted insulin  and reviewed CGM data for [facility] is significant for chronic pain, BPH.  Patient reports Diabetes was diagnosed in 2001.   Current diabetes medications include: Semglee  (insulin  glargine) 16 units once daily, metformin  1000mg  BID, Jardiance  (empagliflozin ) 10mg  daily.  Current hypertension medications include: amlodipine  10mg  Current hyperlipidemia medications include: NOT taking atorvastatin  - requests new supply (admits he has not filled or used supply on hand in > 1 year.  Patient reports adherence to taking all medications as prescribed.  However tramadol  supply has been a challenge.  He recently changed insurances.  With this change of insurance, the pharmacy dispense limited for new users of pain medications is limited to 7 day supply. THEN patient can pick-up larger quantities (30 day supply).  I explained this update to the patient after talking with dispensing pharmacy.  His new prescription is ready for pick-up - $0  Do you feel that your medications are working for you? yes Have you been experiencing any side effects to the medications prescribed? no Do you have any problems obtaining medications due to transportation or finances? Yes with tramadol .  Insurance coverage: changed recently from  [facility] to [place].   Patient denies hypoglycemic events.  CGM dysfunction reported.  Review of his phone found that his bluetooth was OFF.  We placed a sample sensor - libre 3+ and reconnected patient to his CGM.   O:   Review of Systems  Musculoskeletal:   Positive for joint pain.  All other systems reviewed and are negative.   Physical Exam Vitals reviewed.  Constitutional:      Appearance: Normal appearance.  Pulmonary:     Effort: Pulmonary effort is normal.  Neurological:     Mental Status: He is alert.  Psychiatric:        Mood and Affect: Mood normal.        Behavior: Behavior normal.        Thought Content: Thought content normal.        Judgment: Judgment normal.    Lab Results  Component Value Date   HGBA1C 11.5 (A) 07/09/2023   CGM - review showed 1/3 of readings in range.   No readings in last 7 days.     Vitals:   10/09/23 1015  BP: 124/62  Pulse: 74  SpO2: 99%   Lipid Panel     Component Value Date/Time   CHOL 94 (L) 04/17/2021 0925   TRIG 40 04/17/2021 0925   HDL 49 04/17/2021 0925   CHOLHDL 1.9 04/17/2021 0925   CHOLHDL 2.7 07/07/2014 1621   VLDL 10 07/07/2014 1621   LDLCALC 34 04/17/2021 0925    A/P: Diabetes  longstanding since 2001 currently uncontrolled despite use of insulin  therapy. Patient is able to verbalize appropriate hypoglycemia management plan. Medication adherence appears good. Control remains suboptimal due to inadequate long-acting insulin  dosage.  - Change insulin  glargine from 16 to 18 units once daily  - RESTART use of FreeStyle Libre 3+  - Continue taking metformin  1,000 mg BID and Jardiance  (empagliflozin )  daily.  -Counseled on s/sx of and management of hypoglycemia.  Ordered pen needle tips for use with insulin .    Chronic pain - issue with medication supply was due to change of insurance.  First prescription limit of 7 days.  Now patient can receive 30 day supply at previous dose.   Patient thanked me for explaining issue and resolving problem.   Hyperlipidemia - non-adherent with statin therapy self-reported today.  Old supply taken from patient and sent for destruction due to patient requesting NEW supply.  - Patient agreed to take atorvastatin  40mg  daily.   Written  patient instructions provided. Patient verbalized understanding of treatment plan.  Total time in face to face counseling 37 minutes.    Follow-up:  Pharmacist 2-4 weeks following visit to meet new PCP - please help patient schedule at next visit. PCP clinic visit in 8/13

## 2023-10-09 NOTE — Assessment & Plan Note (Signed)
 Diabetes  longstanding since 2001 currently uncontrolled despite use of insulin  therapy. Patient is able to verbalize appropriate hypoglycemia management plan. Medication adherence appears good. Control remains suboptimal due to inadequate long-acting insulin  dosage.  - Change insulin  glargine from 16 to 18 units once daily  - RESTART use of FreeStyle Libre 3+  - Continue taking metformin  1,000 mg BID and Jardiance  (empagliflozin ) daily.  -Counseled on s/sx of and management of hypoglycemia.

## 2023-10-09 NOTE — Patient Instructions (Addendum)
????? ?????? ?????! ???? ???? ????? ?????? ??? ???? ????? ????? ?? ??? ??? ??? ??????? ???????.  ????? ??? ???? ???????? ???? ??   80-130 ??? ????? ???? ?? 180 ????.  ??????? ??????:  ????? ???? ?????? (??????? ???????) ?? 16 ??? 18 ???? ??????.  ????????? ??? ???? ??????? ?????? ???? ???????.  ???? ????? ??? ???? ?? ??????? ?????? ???? (???? ???? ????? ?? ????) ??????? ??? ?? ?????? ???????.  ????? ?? ????? ???? ????? ??? ??????? ???????. ???? ??? ????? ???? ????? ??? ?????????? ???????? ??????? ??????? ?? ?????? (??????? ????? ??????? ?????). ???? ???? ?? ????? ????? ?????? ????????? ??????? ?? ??????? (????? ?? ???????)? ????? ????????? ??????? ??? ?????? ??? ???? ?? ??? ????? ??? ???????.  ?????? ??? ????? ???????? ?? ????? ?? ????? ????? ?? ????? ??????. srrt biruyatik alyawma! bishakl eami, tahasanat qira'at sukar aldam ladayki, lakinaha lam tasil baed 'iilaa almustawaa almatlubi. mustawaa sukar aldam almustahdaf ladayk hu 80-130 qabl al'akl wa'aqala min 180 baedahu. taghyirat aldawa'i: ziadat jureat simjili ('ansulin jlarjin) min 16 'iilaa 18 wahdat ywmyan. aliastimrar ealaa jamie al'adwiat al'ukhraa binafs altariqati. raqib mustawaa sukar aldam fi almanzili, wahtafaz bisijil (jihaz qias alsukar 'aw waraqati) li'iihdarih maeak fi ziaratik alqadimati. aistamara fi atibae nizam ghidhayiyin sihiyin wamumarasat alriyadati. ahris ealaa atibae nizam ghidhayiyin ghaniin bialkhadrawat walfawakih walluhum alkhaliat min alduhun (aldajaji, aldiyk alruwmi, alsamki). hawal alhada min tanawul almulih bitanawul alkhadrawat altaazajat 'aw almujamada (bdlaan min almuealabati), waghsil alkhadrawat almuealabat qabl altahi, wala tudf 'aya milh 'iidafiin 'iilaa alwajabati. aleawdat 'iilaa eiadat alsaydaliat mae kufal fi 'awakhir 'aghustus 'aw 'awayil sibtambar.   It was nice to see you today! Overall your glucose readings are improved but not yet at goal.   Your goal blood sugar is 80-130 before eating  and less than 180 after eating.  Medication Changes: Increase Semglee  (insulin  glargine) from 16 to 18 units daily  Continue all other medication the same.   Monitor blood sugars at home and keep a log (glucometer or piece of paper) to bring with you to your next visit.  Keep up the good work with diet and exercise. Aim for a diet full of vegetables, fruit and lean meats (chicken, [place], fish). Try to limit salt intake by eating fresh or frozen vegetables (instead of canned), rinse canned vegetables prior to cooking and do not add any additional salt to meals.   Back to pharmacy clinic with Kaeya Schiffer in late August or early September.

## 2023-10-12 NOTE — Progress Notes (Signed)
 Reviewed and agree with Dr Macky Lower plan.

## 2023-10-28 ENCOUNTER — Ambulatory Visit (INDEPENDENT_AMBULATORY_CARE_PROVIDER_SITE_OTHER): Admitting: Family Medicine

## 2023-10-28 ENCOUNTER — Encounter: Payer: Self-pay | Admitting: Family Medicine

## 2023-10-28 DIAGNOSIS — Z789 Other specified health status: Secondary | ICD-10-CM

## 2023-10-28 DIAGNOSIS — G8929 Other chronic pain: Secondary | ICD-10-CM

## 2023-10-28 DIAGNOSIS — K5900 Constipation, unspecified: Secondary | ICD-10-CM

## 2023-10-28 DIAGNOSIS — I10 Essential (primary) hypertension: Secondary | ICD-10-CM | POA: Diagnosis not present

## 2023-10-28 DIAGNOSIS — E1140 Type 2 diabetes mellitus with diabetic neuropathy, unspecified: Secondary | ICD-10-CM

## 2023-10-28 DIAGNOSIS — Z794 Long term (current) use of insulin: Secondary | ICD-10-CM | POA: Diagnosis not present

## 2023-10-28 DIAGNOSIS — Z1211 Encounter for screening for malignant neoplasm of colon: Secondary | ICD-10-CM

## 2023-10-28 DIAGNOSIS — J301 Allergic rhinitis due to pollen: Secondary | ICD-10-CM

## 2023-10-28 DIAGNOSIS — R109 Unspecified abdominal pain: Secondary | ICD-10-CM

## 2023-10-28 DIAGNOSIS — M545 Low back pain, unspecified: Secondary | ICD-10-CM

## 2023-10-28 LAB — POCT GLYCOSYLATED HEMOGLOBIN (HGB A1C): HbA1c, POC (controlled diabetic range): 9.1 % — AB (ref 0.0–7.0)

## 2023-10-28 MED ORDER — TRAMADOL HCL 50 MG PO TABS: 50.0000 mg | ORAL_TABLET | Freq: Every day | ORAL | 2 refills | Status: DC | PRN

## 2023-10-28 MED ORDER — FREESTYLE LIBRE 3 PLUS SENSOR MISC: 11 refills | Status: DC

## 2023-10-28 MED ORDER — AZELASTINE-FLUTICASONE 137-50 MCG/ACT NA SUSP
1.0000 | Freq: Two times a day (BID) | NASAL | 11 refills | Status: DC
Start: 2023-10-28 — End: 2023-12-03

## 2023-10-28 MED ORDER — POLYETHYLENE GLYCOL 3350 17 GM/SCOOP PO POWD
17.0000 g | Freq: Two times a day (BID) | ORAL | 1 refills | Status: AC | PRN
Start: 2023-10-28 — End: ?

## 2023-10-28 NOTE — Patient Instructions (Addendum)
 It was so good to see you today! Thank you for allowing me to take care of you.  Today we discussed the following concerns and plans:  Low appetite/pain in side/constipation - I have referred you for colonoscopy - the office will call you to schedule this. - please start taking 1 capful of Miralax  1-2 times daily mixed into your drink, this should help with the constipation. - stay well hydrated  I would not recommend taking the Lycium Plus pills - they have licorice extract which could cause problems with your other health conditions.  We collected some lab work today. I will let you know the results.  If you have any concerns, please call the clinic or schedule an appointment.  It was a pleasure to take care of you today. Be well!  Lauraine Norse, DO [facility], PGY-2  Do you need your medications delivered to your home?   We'll send your prescription to the [facility] for delivery.          Address: [address] [place], [place], [place] [address]          Phone: [phone]  Please call the [facility] to speak with a pharmacist and set up your home medication delivery. If you have any questions, feel free to contact us  -- we're happy to help!  Other [place] [facility]acies that offer affordable prices on both prescriptions and over-the-counter items, as well as convenient services like vaccinations, are  [facility], at [facility]         Address:  [address], [place], [place] [address]         Phone: [phone]  [facility], located in the [facility]        Address: [address], [place], [place] [address]        Phone: [phone]  [facility], at [facility]       Address: [address], [place], [place] [address]       Phone: [phone]  [facility], at [facility]       Address: [address] Floor, [place], [place] [address]       Phone: [phone]

## 2023-10-28 NOTE — Assessment & Plan Note (Signed)
 A1c today improved to 9.1 (was 11.5 in April). - continue metformin  1000 mg BID, Jardiance  10 mg daily, insulin  18U daily. - recheck A1c in 3 months (approx. November)

## 2023-10-28 NOTE — Assessment & Plan Note (Signed)
 Well-controlled. - continue amlodipine  10 mg daily, losartan  50 mg daily

## 2023-10-28 NOTE — Progress Notes (Signed)
    SUBJECTIVE:   CHIEF COMPLAINT / HPI:   T2DM w neuropathy Has CGM, he says sugars usually 130s-200s. Taking metformin  1000 mg BID, Jardiance  10 mg daily, insulin  18U daily. Due for Foot exam and ophthalmology exam. Does have neuropathy in bilateral feet, managed well with gabapentin .  Pain Sharp pain on left side yesterday, lasted 1 hour and then went went away on its own. No abdominal pain,  Has had constipation, BM every day. Has to strain, BM looks like small pieces.  No blood in stool.  Low appetite Ongoing 1-2 months No significant weight loss over last 4 months - in fact showed weight gain of 6 lbs from April to May. Had colonoscopy 10 years ago.  Also taking supplements from a friend in [place]: - Forever Nature-Min - Forever Garlic-Thyme - Forever Lycium Plus (licorice extract 37.5 mg) - Forever Field of Greens (barley, wheat, alfalfa, cayenne)  PERTINENT  PMH / PSH:  HTN PUD BPH   OBJECTIVE:   There were no vitals taken for this visit.  General: well-appearing, no acute distress. HEENT: normocephalic, PERRLA, EOM grossly intact, MMM. Cardio: Regular rate, regular rhythm, no murmurs on exam. Pulm: Clear, no wheezing, no crackles. No increased work of breathing. Abdominal: bowel sounds present, soft, non-tender, non-distended. Extremities: no peripheral edema. Moves all extremities equally. Neuro: Alert and oriented x3, speech normal in content, no facial asymmetry. Psych:  Cognition and judgment appear intact.    ASSESSMENT/PLAN:   Assessment & Plan Type 2 diabetes mellitus with diabetic neuropathy, with long-term current use of insulin  (HCC) A1c today improved to 9.1 (was 11.5 in April). - continue metformin  1000 mg BID, Jardiance  10 mg daily, insulin  18U daily. - recheck A1c in 3 months (approx. November) Essential hypertension Well-controlled. - continue amlodipine  10 mg daily, losartan  50 mg daily Constipation, unspecified constipation  type Suspect constipation is because of left sided abdominal pain, possibly also causing his low appetite.  Reassuringly he has not had recent weight loss and no blood in stool. - Due for colonoscopy, this is ordered today - Patient will try MiraLAX  1-2 times daily -Return precautions given Takes dietary supplements Counseled patient about dietary supplements.  These appear to be mostly benign, though I encouraged him not to spend lots of money on these. - Strongly encouraged patient to discontinue Lycium Plus supplement given that this contains licorice extract     Lauraine Norse, DO [facility]

## 2023-10-29 ENCOUNTER — Ambulatory Visit: Payer: Self-pay | Admitting: Family Medicine

## 2023-10-29 LAB — LIPID PANEL
Chol/HDL Ratio: 1.8 ratio (ref 0.0–5.0)
Cholesterol, Total: 98 mg/dL — ABNORMAL LOW (ref 100–199)
HDL: 56 mg/dL (ref >39)
LDL Chol Calc (NIH): 31 mg/dL (ref 0–99)
Triglycerides: 38 mg/dL (ref 0–149)
VLDL Cholesterol Cal: 11 mg/dL (ref 5–40)

## 2023-10-30 LAB — MICROALBUMIN / CREATININE URINE RATIO
Creatinine, Urine: 72.1 mg/dL
Microalb/Creat Ratio: 8 mg/g{creat} (ref 0–29)
Microalbumin, Urine: 5.6 ug/mL

## 2023-11-03 ENCOUNTER — Ambulatory Visit (INDEPENDENT_AMBULATORY_CARE_PROVIDER_SITE_OTHER): Admitting: Student

## 2023-11-03 VITALS — BP 126/75 | HR 77 | Ht 69.0 in | Wt 138.6 lb

## 2023-11-03 DIAGNOSIS — M545 Low back pain, unspecified: Secondary | ICD-10-CM | POA: Diagnosis not present

## 2023-11-03 DIAGNOSIS — Z1211 Encounter for screening for malignant neoplasm of colon: Secondary | ICD-10-CM

## 2023-11-03 DIAGNOSIS — G8929 Other chronic pain: Secondary | ICD-10-CM | POA: Diagnosis not present

## 2023-11-03 MED ORDER — TIZANIDINE HCL 4 MG PO TABS
4.0000 mg | ORAL_TABLET | Freq: Four times a day (QID) | ORAL | 0 refills | Status: DC | PRN
Start: 2023-11-03 — End: 2023-11-27

## 2023-11-03 MED ORDER — LIDOCAINE 5 % EX PTCH: 1.0000 | MEDICATED_PATCH | CUTANEOUS | 0 refills | Status: DC

## 2023-11-03 NOTE — Patient Instructions (Addendum)
 Pleasure to meet you today.  Looking at your chart over the years you have been receiving mostly tramadol  for your back pain.  I recommend continuing your tramadol  and in addition I have sent in prescription for tizanidine  which is a muscle relaxant that should help with the pain.  Also you can do over-the-counter lidocaine  patch.  If no improvement in the next 2 weeks or if pain gets worse please return to get an x-ray.  I have also sent in referral to gastroenterologist for your colonoscopy.  You will receive a call to schedule that appointment to screen for colon cancer as you are due for colonscopy

## 2023-11-03 NOTE — Assessment & Plan Note (Signed)
 Chronic low back pain post-lumbar laminectomy in 2008. Pain persists without leg numbness or tingling. Tramadol  ineffective; gabapentin  used. Physical therapy attempted multiple times. - Continue tramadol  50 mg as needed - On Rx tizanidine  4 mg every 6 hours as needed - Recommend use of lidocaine  patch - Advised to return in 2 weeks if no improvement - Will consider imaging and referral to orthopedic if pain persist.

## 2023-11-03 NOTE — Progress Notes (Signed)
    SUBJECTIVE:   CHIEF COMPLAINT / HPI:   Mitchell Page is a 68 year old male with chronic back pain who presents for ongoing management of his symptoms.  He has experienced chronic back pain for several years, persisting despite a lumbar laminectomy in 2008. The pain worsens with physical activity and is not relieved by his current medication regimen. He has been taking tramadol  for over 25 years, but it is no longer effective, even when taking two tablets during severe pain episodes. He also takes gabapentin . He mentions receiving a medication at the clinic that alleviates his pain within one to two days, but he is unsure of its name.  He has previously engaged in physical therapy multiple times, which included exercises. He had numbness and tingling in his legs before his back surgery, but not after. He denies any history of bowel or bladder incontinence.   PERTINENT  PMH / PSH: Reviewed   OBJECTIVE:   BP 126/75   Pulse 77   Ht 5' 9 (1.753 m)   Wt 138 lb 9.6 oz (62.9 kg)   SpO2 94%   BMI 20.47 kg/m    NAD, Elderly male  Back No gross deformity, scoliosis. Mild tenderness TTP currently.  FROM. Strength LEs 5/5 all muscle groups.   Limited SLR due to pain Sensation intact to light touch bilaterally.  ASSESSMENT/PLAN:   Lower back pain Chronic low back pain post-lumbar laminectomy in 2008. Pain persists without leg numbness or tingling. Tramadol  ineffective; gabapentin  used. Physical therapy attempted multiple times. - Continue tramadol  50 mg as needed - On Rx tizanidine  4 mg every 6 hours as needed - Recommend use of lidocaine  patch - Advised to return in 2 weeks if no improvement - Will consider imaging and referral to orthopedic if pain persist.  Colon cancer screening Patient currently colonoscopy at last colonoscopy was in 2011 where multiple polyps were removed - Placed referral to gastroenterologist for colonoscopy.  Norleen April, MD [facility]

## 2023-11-05 ENCOUNTER — Telehealth: Payer: Self-pay

## 2023-11-05 NOTE — Telephone Encounter (Signed)
 Pharmacy Patient Advocate Encounter  Received notification from [employer] that Prior Authorization for LIDOCAINE  5% PATCHES has been DENIED.  Full denial letter will be uploaded to the media tab. See denial reason below.  Prior authorization criteria unmet; prescriber denied usage for indicated neuropathic pain conditions including post-herpetic neuralgia, diabetic, and cancer-related neuropathy.   LIDOCAINE  4% PATCHES AVAILABLE OTC  PA #/Case ID/Reference #: [id number]

## 2023-11-05 NOTE — Telephone Encounter (Signed)
 Prior authorization submitted for LIDOCAINE  5% PATCHES to [facility] via Latent.   Key: [id number]

## 2023-11-06 ENCOUNTER — Telehealth: Payer: Self-pay

## 2023-11-06 NOTE — Telephone Encounter (Signed)
 Patient calls nurse line in regards to Tramadol  prescription.   He reports difficultly picking [facility]. Prescription was called in on 8/13.  [facility] called [facility] and they report too soon to fill. She reports he can pick this prescription up on 8/24.  Patient has been made aware.

## 2023-11-20 ENCOUNTER — Other Ambulatory Visit: Payer: Self-pay

## 2023-11-20 MED ORDER — EMPAGLIFLOZIN 10 MG PO TABS: 10.0000 mg | ORAL_TABLET | Freq: Every day | ORAL | 3 refills | Status: DC

## 2023-11-27 ENCOUNTER — Encounter: Payer: Self-pay | Admitting: Family Medicine

## 2023-11-27 ENCOUNTER — Ambulatory Visit (INDEPENDENT_AMBULATORY_CARE_PROVIDER_SITE_OTHER): Admitting: Family Medicine

## 2023-11-27 VITALS — BP 126/78 | HR 71 | Wt 145.2 lb

## 2023-11-27 DIAGNOSIS — K5900 Constipation, unspecified: Secondary | ICD-10-CM

## 2023-11-27 DIAGNOSIS — G8929 Other chronic pain: Secondary | ICD-10-CM

## 2023-11-27 DIAGNOSIS — K219 Gastro-esophageal reflux disease without esophagitis: Secondary | ICD-10-CM | POA: Diagnosis not present

## 2023-11-27 DIAGNOSIS — M545 Low back pain, unspecified: Secondary | ICD-10-CM

## 2023-11-27 DIAGNOSIS — I10 Essential (primary) hypertension: Secondary | ICD-10-CM | POA: Diagnosis not present

## 2023-11-27 MED ORDER — TIZANIDINE HCL 4 MG PO TABS: 4.0000 mg | ORAL_TABLET | Freq: Four times a day (QID) | ORAL | 0 refills | Status: DC | PRN

## 2023-11-27 MED ORDER — LOSARTAN POTASSIUM 50 MG PO TABS
50.0000 mg | ORAL_TABLET | Freq: Every day | ORAL | 3 refills | Status: AC
Start: 2023-11-27 — End: ?

## 2023-11-27 MED ORDER — BUTALBITAL-APAP-CAFFEINE 50-325-40 MG PO TABS: 1.0000 | ORAL_TABLET | Freq: Four times a day (QID) | ORAL | 1 refills | Status: AC | PRN

## 2023-11-27 MED ORDER — OMEPRAZOLE 40 MG PO CPDR: 40.0000 mg | DELAYED_RELEASE_CAPSULE | Freq: Every day | ORAL | 1 refills | Status: DC

## 2023-11-27 NOTE — Progress Notes (Signed)
    SUBJECTIVE:  Interpreter is present and aids in conversation.  CHIEF COMPLAINT / HPI:   Constipation follow up At 8/13 visit pt agreed to trial Miralax  1-2 times daily. He has been doing this without issues. He tells me today that constipation has resolved. He is having daily BMs without any straining. Stool is formed but soft. He is eating well and his appetite has improved. No belly pain.  PERTINENT  PMH / PSH: Reviewed. HTN PUD BPH  OBJECTIVE:   BP 126/78   Pulse 71   Wt 145 lb 3.2 oz (65.9 kg)   SpO2 100%   BMI 21.44 kg/m   General: well-appearing, no acute distress. HEENT: normocephalic, PERRLA, EOM grossly intact, MMM. Cardio: Regular rate, regular rhythm, no murmurs on exam. Pulm: Clear, no wheezing, no crackles. No increased work of breathing. Abdominal: bowel sounds present, soft, non-tender, non-distended. Extremities: Moves all extremities equally. Neuro: Alert and oriented x3, speech normal in content, no facial asymmetry Psych:  Cognition and judgment appear intact. Alert, communicative, and cooperative.   ASSESSMENT/PLAN:   Assessment & Plan Constipation, unspecified constipation type Greatly improved. Counseled patient that he may not need to continue Miralax  forever - he may decrease or stop. If constipation recurs he may continue with Miralax . - he prefers to continue with Miralax  one capful at least once daily, which is reasonable - encouraged good hydration and fiber-rich diet    Lauraine Norse, DO [facility]

## 2023-11-27 NOTE — Patient Instructions (Signed)
 It was so good to see you today! Thank you for allowing me to take care of you.  Today we discussed the following concerns and plans:   Keep up the good work! Take all of your medications as prescribed, and continue taking Miralax  as needed for constipation.   If you have any concerns, please call the clinic or schedule an appointment.  It was a pleasure to take care of you today. Be well!  Lauraine Norse, DO [facility], PGY-2  Do you need your medications delivered to your home?   We'll send your prescription to the [facility] for delivery.          Address: [address] [place], [place], [place] [address]          Phone: [phone]  Please call the [facility] to speak with a pharmacist and set up your home medication delivery. If you have any questions, feel free to contact us  -- we're happy to help!  Other [place] [facility]acies that offer affordable prices on both prescriptions and over-the-counter items, as well as convenient services like vaccinations, are  [facility], at [facility]         Address:  [address], [place], [place] [address]         Phone: [phone]  [facility], located in the [facility]        Address: [address] [place], [place] [address]        Phone: [phone]  [facility], at [facility]       Address: [address], [place], [place] [address]       Phone: [phone]  [facility], at [facility]       Address: [address], [place], [place] [address]       Phone: [phone]

## 2023-12-01 ENCOUNTER — Telehealth: Payer: Self-pay | Admitting: Pharmacist

## 2023-12-01 NOTE — Telephone Encounter (Signed)
 Patient contacted for follow-up of diabetes management  Since last contact patient reports plan to replace cgm this morning. Willing to schedule visit to discuss changes in regimen.  Current Medications include: Jardiance  (empagliflozin ) 10 mg, Semglee  (insulin  glargine) 16 units daily, metformin  1000 mg BID Patient denies any significant medication related side effects.  Total time with patient call and documentation of interaction: 9 minutes.  F/U visit scheduled: 9/18 @ 2 pm

## 2023-12-01 NOTE — Telephone Encounter (Signed)
 Reviewed and agree with Dr Rennis plan.

## 2023-12-03 ENCOUNTER — Encounter: Payer: Self-pay | Admitting: Pharmacist

## 2023-12-03 ENCOUNTER — Ambulatory Visit: Admitting: Pharmacist

## 2023-12-03 ENCOUNTER — Ambulatory Visit (INDEPENDENT_AMBULATORY_CARE_PROVIDER_SITE_OTHER): Admitting: Pharmacist

## 2023-12-03 VITALS — BP 128/73 | HR 68 | Wt 146.0 lb

## 2023-12-03 DIAGNOSIS — I10 Essential (primary) hypertension: Secondary | ICD-10-CM | POA: Diagnosis not present

## 2023-12-03 DIAGNOSIS — G8929 Other chronic pain: Secondary | ICD-10-CM

## 2023-12-03 DIAGNOSIS — E1140 Type 2 diabetes mellitus with diabetic neuropathy, unspecified: Secondary | ICD-10-CM | POA: Diagnosis not present

## 2023-12-03 DIAGNOSIS — Z794 Long term (current) use of insulin: Secondary | ICD-10-CM

## 2023-12-03 DIAGNOSIS — J301 Allergic rhinitis due to pollen: Secondary | ICD-10-CM

## 2023-12-03 MED ORDER — RYALTRIS 665-25 MCG/ACT NA SUSP
1.0000 | Freq: Two times a day (BID) | NASAL | 3 refills | Status: DC
Start: 2023-12-03 — End: 2024-01-12

## 2023-12-03 MED ORDER — EMPAGLIFLOZIN 25 MG PO TABS: 25.0000 mg | ORAL_TABLET | Freq: Every day | ORAL | 3 refills | Status: AC

## 2023-12-03 MED ORDER — SILDENAFIL CITRATE 100 MG PO TABS: 100.0000 mg | ORAL_TABLET | Freq: Every day | ORAL | 4 refills | Status: DC | PRN

## 2023-12-03 MED ORDER — TRESIBA FLEXTOUCH 100 UNIT/ML ~~LOC~~ SOPN: 18.0000 [IU] | PEN_INJECTOR | Freq: Every day | SUBCUTANEOUS | 11 refills | Status: DC

## 2023-12-03 NOTE — Progress Notes (Signed)
 Reviewed and agree with Dr Rennis plan.

## 2023-12-03 NOTE — Assessment & Plan Note (Signed)
 Hypertension longstanding currently controlled. Blood pressure goal of <130/80 mmHg. Medication adherence good.  -Continued amlodipine  10 mg daily, losartan  50 mg daily

## 2023-12-03 NOTE — Progress Notes (Signed)
 S:     Chief Complaint  Patient presents with   Medication Management    T2DM   68 y.o. male who presents for diabetes evaluation, education, and management. Patient arrives in good spirits and presents without any assistance. Reports constant back pain after a surgery years ago.   Patient was referred and last seen by Primary Care Provider, Dr. Lafe, on 11/27/2023.  At last visit, reporting constipation improved with Miralax  use and that he prefers to continue to use daily. Patient still using Miralax  and reporting improved bowel movements with use.   PMH is significant for T2DM, hypertension, Chronic Pain, BPH.  Patient reports Diabetes was diagnosed in 2001.   Current diabetes medications include: Tresiba  (insulin  degludec) 18 units daily, metformin  1000 mg BID, Jardiance  (empagliflozin ) 10 mg daily Current hypertension medications include: amlodipine  10 mg daily,  losartan  50 mg daily Current hyperlipidemia medications include: atorvastatin  40 mg daily  Patient reports adherence to taking all medications as prescribed. Patient reports needing refills on Fioricet (butalbital -APAP-caffeine ) 50-325-40 mg and tramadol  50 mg  Do you feel that your medications are working for you? yes Have you been experiencing any side effects to the medications prescribed? Yes - does get lethargic after taking tizanidine , but no other issues with medications Insurance coverage: [employer] [place] [employer]  Patient denies hypoglycemic events.  Home Blood Pressure readings:   Patient reports nocturia (nighttime urination). -- Goes 2-3x nightly when it is cold, but only ~1x when not cold Patient denies neuropathy (nerve pain). Patient denies visual changes. - recent eye doctor visit & all was well  Patient reports self foot exams.   Patient reported dietary habits: eats very late at night  O:   Review of Systems  Musculoskeletal:  Positive for back pain.  All other systems reviewed  and are negative.   Physical Exam Vitals reviewed.  Constitutional:      Appearance: Normal appearance. He is normal weight.  Pulmonary:     Effort: Pulmonary effort is normal.  Neurological:     Mental Status: He is alert.  Psychiatric:        Mood and Affect: Mood normal.        Behavior: Behavior normal.        Thought Content: Thought content normal.        Judgment: Judgment normal.    7 day average blood glucose: 212  Libre3 CGM Download today 11/30/2023 % Time CGM is active: 98% Average Glucose: 212 mg/dL Glucose Management Indicator: 8.4  Glucose Variability: 36% (goal <36%) Time in Goal:  - Time in range 70-180: 40% - Time above range: 60% - Time below range: 0% Observed patterns:  Lab Results  Component Value Date   HGBA1C 9.1 (A) 10/28/2023   Vitals:   12/03/23 1049  BP: 128/73  Pulse: 68  SpO2: 98%    Lipid Panel     Component Value Date/Time   CHOL 98 (L) 10/28/2023 0951   TRIG 38 10/28/2023 0951   HDL 56 10/28/2023 0951   CHOLHDL 1.8 10/28/2023 0951   CHOLHDL 2.7 07/07/2014 1621   VLDL 10 07/07/2014 1621   LDLCALC 31 10/28/2023 0951    Clinical Atherosclerotic Cardiovascular Disease (ASCVD): No    A/P: Diabetes longstanding currently uncontrolled but improving with GMI of 8.4% improving from most recent A1c of 9.1%. Patient is able to verbalize appropriate hypoglycemia management plan. Medication adherence appears good. Control is suboptimal due to suboptimal medication regimen as evidenced by 36%  variability. -Increased Jardiance  (empagliflozin ) from 10 mg daily to 25 mg daily - patient to take 2 X 10 mg tablets (20 mg) daily until gone and then start 25 mg dose  -Continued Tresiba  (insulin  degludec) 18 units daily, metformin  1000 mg BID -Discussed possible need for mealtime insulin  in the future, patient amenable if necessary - consider addition of bolus insulin  if variability not decreasing -Patient educated on purpose, proper use, and  potential adverse effects.  -Extensively discussed pathophysiology of diabetes, recommended lifestyle interventions, dietary effects on blood sugar control.  -Counseled on s/sx of and management of hypoglycemia.  -Next A1c anticipated Nov/Dec.   ASCVD risk - primary prevention in patient with diabetes. Last LDL is 31 at goal of <70  mg/dL.  -Continued atorvastatin  40 mg daily   Hypertension longstanding currently controlled. Blood pressure goal of <130/80 mmHg. Medication adherence good.  -Continued amlodipine  10 mg daily, losartan  50 mg daily  Patient requested and refills sent to preferred pharmacy for sildenafil  100 mg tablets prn and Azelastine -Fluticasone  nasal spray. Prior nasal spray was not preferred by insurance and switch was made to Ryaltris  (olopatadine-mometasone) nasal spray 1 spray BID.  -Patient reports needing refills on Fioricet (butalbital -APAP-caffeine ) 50-325-40 mg and tramadol  50 mg - unable to fill this and referring to PCP Dr. Lafe   -Requesting refills of pain medications:   Written patient instructions provided. Patient verbalized understanding of treatment plan.  Total time in face to face counseling 26 minutes.    Follow-up:  Pharmacist Nov-Dec  PCP clinic visit on 01/05/2024 Patient seen with Fonda Blase, PharmD Candidate - PY3 student and Estefana Blase, PharmD Candidate - PY4 student.

## 2023-12-03 NOTE — Patient Instructions (Signed)
 It was nice to see you today!  Your goal blood sugar is 80-130 before eating and less than 180 after eating.  Medication Changes: Take 2 tablets of your Jardiance  (empagliflozin ) daily (total 20 mg dose) until you run out. When you run out pick up 25 mg tablets from your pharmacy and start taking 1 tablet daily of the higher dose.   Continue all other medication the same.  Keep up the good work with diet and exercise. Aim for a diet full of vegetables, fruit and lean meats (chicken, [place], fish). Try to limit salt intake by eating fresh or frozen vegetables (instead of canned), rinse canned vegetables prior to cooking and do not add any additional salt to meals.

## 2023-12-03 NOTE — Assessment & Plan Note (Addendum)
 Diabetes longstanding currently uncontrolled but improving with GMI of 8.4% improving from most recent A1c of 9.1%. Patient is able to verbalize appropriate hypoglycemia management plan. Medication adherence appears good. Control is suboptimal due to suboptimal medication regimen as evidenced by 36% variability. -Increased Jardiance  (empagliflozin ) from 10 mg daily to 25 mg daily - patient to take 2 X 10 mg tablets (20 mg) daily until gone and then start 25 mg dose  -Continued Tresiba  (insulin  degludec) 18 units daily, metformin  1000 mg BID -Discussed possible need for mealtime insulin  in the future, patient amenable if necessary - consider addition of bolus insulin  if variability not decreasing -Patient educated on purpose, proper use, and potential adverse effects.  -Extensively discussed pathophysiology of diabetes, recommended lifestyle interventions, dietary effects on blood sugar control.

## 2023-12-04 ENCOUNTER — Other Ambulatory Visit: Payer: Self-pay

## 2023-12-04 DIAGNOSIS — E1140 Type 2 diabetes mellitus with diabetic neuropathy, unspecified: Secondary | ICD-10-CM

## 2023-12-04 MED ORDER — GABAPENTIN 300 MG PO CAPS: 300.0000 mg | ORAL_CAPSULE | Freq: Two times a day (BID) | ORAL | 3 refills | Status: AC

## 2024-01-05 ENCOUNTER — Ambulatory Visit (INDEPENDENT_AMBULATORY_CARE_PROVIDER_SITE_OTHER): Admitting: Family Medicine

## 2024-01-05 ENCOUNTER — Encounter: Payer: Self-pay | Admitting: Family Medicine

## 2024-01-05 VITALS — BP 113/72 | HR 82 | Wt 151.0 lb

## 2024-01-05 DIAGNOSIS — Z23 Encounter for immunization: Secondary | ICD-10-CM | POA: Diagnosis not present

## 2024-01-05 DIAGNOSIS — G8929 Other chronic pain: Secondary | ICD-10-CM

## 2024-01-05 DIAGNOSIS — E1140 Type 2 diabetes mellitus with diabetic neuropathy, unspecified: Secondary | ICD-10-CM

## 2024-01-05 DIAGNOSIS — Z1211 Encounter for screening for malignant neoplasm of colon: Secondary | ICD-10-CM

## 2024-01-05 DIAGNOSIS — Z794 Long term (current) use of insulin: Secondary | ICD-10-CM

## 2024-01-05 DIAGNOSIS — M545 Low back pain, unspecified: Secondary | ICD-10-CM

## 2024-01-05 DIAGNOSIS — I10 Essential (primary) hypertension: Secondary | ICD-10-CM | POA: Diagnosis not present

## 2024-01-05 LAB — POCT GLYCOSYLATED HEMOGLOBIN (HGB A1C): HbA1c POC (<> result, manual entry): 9.2 % (ref 4.0–5.6)

## 2024-01-05 MED ORDER — FREESTYLE LIBRE 3 PLUS SENSOR MISC: 11 refills | Status: AC

## 2024-01-05 MED ORDER — TRAMADOL HCL 50 MG PO TABS: 50.0000 mg | ORAL_TABLET | Freq: Every day | ORAL | 2 refills | Status: AC | PRN

## 2024-01-05 NOTE — Patient Instructions (Addendum)
 Keep taking your medications as prescribed.  If you have more low sugars (less than 70) before your appointment next week please call us .  Please call the GI office to schedule your colonoscopy: [facility] [address], [place], [place] [address] Phone: [phone]

## 2024-01-05 NOTE — Assessment & Plan Note (Signed)
 A1c today slightly worse than prior, 9.2. Likely needs mealtime coverage insulin , however given several lows into the 50s I am hesitant to make changes at this time. Suspect the irregular timing of Tresiba  dosing could be culprit. - scheduled with Dr. Koval in 1 week to discuss insulin  adjustment

## 2024-01-05 NOTE — Progress Notes (Signed)
    SUBJECTIVE:   CHIEF COMPLAINT / HPI:   T2DM follow up Currently prescribed Jardiance  25 mg daily, metformin  1000 mg BID, tresiba  (insulin ) 18 units daily. He is taking all medication as prescribed and without adverse effects. Last A1c in August was 9.1. A1c today 9.2. Sugars at home fasting in 160s and postprandial in 200s. He does note 4 mornings where he woke up and sugars in 50s. Takes his Tresiba  at varying times during the day, could not be more specific than that.  HTN follow up Currently prescribed amlodipine  10 mg daily, losartan  50 mg daily. He is taking all medication as prescribed and without adverse effects.  Health Maintenance Due for colonoscopy. Referral has been placed to GI but they have not been successful in scheduling patient.  Also needs diabetic eye exam - states he had this done a few months ago.  PERTINENT  PMH / PSH: Reviewed. PUD BPH  OBJECTIVE:   BP 113/72   Pulse 82   Wt 151 lb (68.5 kg)   SpO2 99%   BMI 22.30 kg/m   General: well-appearing, no acute distress. HEENT: normocephalic, EOM grossly intact, MMM Cardio: Regular rate, regular rhythm, no murmurs on exam. Pulm: Clear, no wheezing, no crackles. No increased work of breathing. Abdominal: bowel sounds present, soft, non-tender, non-distended. No HSM. Extremities: no peripheral edema. Moves all extremities equally. Neuro: Alert and oriented x3, speech normal in content, no facial asymmetry Psych:  Cognition and judgment appear intact. Alert, communicative, and cooperative   ASSESSMENT/PLAN:   Assessment & Plan Type 2 diabetes mellitus with diabetic neuropathy, with long-term current use of insulin  (HCC) A1c today slightly worse than prior, 9.2. Likely needs mealtime coverage insulin , however given several lows into the 50s I am hesitant to make changes at this time. Suspect the irregular timing of Tresiba  dosing could be culprit. - scheduled with Dr. Koval in 1 week to discuss  insulin  adjustment Essential hypertension Well controlled, and in fact at times low for age. Continue medications as above. - at next visit consider dose reduction as appropriate Colon cancer screening - Referral previously placed. Contact information provided to pt to schedule appt: [facility] [address] [place] [address], [place], [place] [address] Phone: [phone] - record release form signed today so we can get ophthalmology records/diabetic eye exam Encounter for immunization     Lauraine Norse, DO [facility]

## 2024-01-05 NOTE — Assessment & Plan Note (Signed)
 Well controlled, and in fact at times low for age. Continue medications as above. - at next visit consider dose reduction as appropriate

## 2024-01-12 ENCOUNTER — Encounter: Payer: Self-pay | Admitting: Pharmacist

## 2024-01-12 ENCOUNTER — Encounter: Payer: Self-pay | Admitting: Family Medicine

## 2024-01-12 ENCOUNTER — Ambulatory Visit (INDEPENDENT_AMBULATORY_CARE_PROVIDER_SITE_OTHER): Admitting: Pharmacist

## 2024-01-12 VITALS — BP 133/70 | HR 77 | Wt 148.0 lb

## 2024-01-12 DIAGNOSIS — J301 Allergic rhinitis due to pollen: Secondary | ICD-10-CM

## 2024-01-12 DIAGNOSIS — E1140 Type 2 diabetes mellitus with diabetic neuropathy, unspecified: Secondary | ICD-10-CM | POA: Diagnosis not present

## 2024-01-12 DIAGNOSIS — G8929 Other chronic pain: Secondary | ICD-10-CM

## 2024-01-12 DIAGNOSIS — Z794 Long term (current) use of insulin: Secondary | ICD-10-CM | POA: Diagnosis not present

## 2024-01-12 DIAGNOSIS — K219 Gastro-esophageal reflux disease without esophagitis: Secondary | ICD-10-CM

## 2024-01-12 MED ORDER — TRESIBA FLEXTOUCH 100 UNIT/ML ~~LOC~~ SOPN: 14.0000 [IU] | PEN_INJECTOR | Freq: Every day | SUBCUTANEOUS | 11 refills | Status: DC

## 2024-01-12 MED ORDER — INSULIN PEN NEEDLE 29G X 4MM MISC: 1.0000 | 11 refills | Status: DC | PRN

## 2024-01-12 MED ORDER — RYALTRIS 665-25 MCG/ACT NA SUSP: 1.0000 | Freq: Two times a day (BID) | NASAL | 3 refills | Status: DC

## 2024-01-12 MED ORDER — OMEPRAZOLE 40 MG PO CPDR: 40.0000 mg | DELAYED_RELEASE_CAPSULE | Freq: Every day | ORAL | 3 refills | Status: DC

## 2024-01-12 MED ORDER — TIZANIDINE HCL 4 MG PO TABS: 4.0000 mg | ORAL_TABLET | Freq: Four times a day (QID) | ORAL | 1 refills | Status: DC | PRN

## 2024-01-12 MED ORDER — INSULIN LISPRO (1 UNIT DIAL) 100 UNIT/ML (KWIKPEN): 4.0000 [IU] | PEN_INJECTOR | Freq: Every day | SUBCUTANEOUS | 11 refills | Status: DC

## 2024-01-12 NOTE — Patient Instructions (Addendum)
 It was nice to see you today!  Your goal blood sugar is 80-130 before eating and less than 180 after eating.  Medication Changes: START Humalog (insulin  lispro) , 4 units subcutaneously daily with largest meal  Decrease dose of Tresiba  (insulin  degludec) from 18 units to 14 units once daily in the morning  Continue all other medication the same.  Monitor blood sugars at home and keep a log (glucometer or piece of paper) to bring with you to your next visit.  Keep up the good work with diet and exercise. Aim for a diet full of vegetables, fruit and lean meats (chicken, turkey, fish). Try to limit salt intake by eating fresh or frozen vegetables (instead of canned), rinse canned vegetables prior to cooking and do not add any additional salt to meals.

## 2024-01-12 NOTE — Progress Notes (Signed)
 S:     Chief Complaint  Patient presents with   Medication Management    DM - CGM   68 y.o. male who presents for diabetes evaluation, education, and management. Patient arrives in good spirits and presents without any assistance.Patient is accompanied by interpreter, Miriama.   Patient was referred and last seen by Primary Care Provider, Dr. Lafe, on 01/05/24.  At last visit, patient was sent to pharmacy to discuss insulin  changes.   PMH is significant for T2DM, HTN.   Current diabetes medications include: Tresiba , Metformin  Current hypertension medications include: amlodipine , losartan  Current hyperlipidemia medications include: atorvastatin  40mg   Patient reports adherence to taking all medications as prescribed. Patient takes Tresiba  dose at irregular times of the day, between the early morning and early afternoon  Do you feel that your medications are working for you? yes Have you been experiencing any side effects to the medications prescribed? yes  Patient reports hypoglycemic events.  Patient reported dietary habits: Eats 3 meals/day  O:   Review of Systems  All other systems reviewed and are negative.   Physical Exam Constitutional:      Appearance: Normal appearance.  Neurological:     Mental Status: He is alert.  Psychiatric:        Mood and Affect: Mood normal.        Behavior: Behavior normal.        Thought Content: Thought content normal.        Judgment: Judgment normal.     Libre3 CGM Download today 01/12/2024 % Time CGM is active: 93% Average Glucose: 200 mg/dL Glucose Management Indicator: 8.1%  Glucose Variability: 41.2% (goal <36%) Time in Goal:  - Time in range 70-180: 45% - Time above range: 54% - Time below range: 1% Observed patterns:  Lab Results  Component Value Date   HGBA1C 9.2 01/05/2024   Vitals:   01/12/24 1049  BP: 133/70  Pulse: 77  SpO2: 96%    Lipid Panel     Component Value Date/Time   CHOL 98 (L)  10/28/2023 0951   TRIG 38 10/28/2023 0951   HDL 56 10/28/2023 0951   CHOLHDL 1.8 10/28/2023 0951   CHOLHDL 2.7 07/07/2014 1621   VLDL 10 07/07/2014 1621   LDLCALC 31 10/28/2023 0951    Clinical Atherosclerotic Cardiovascular Disease (ASCVD): No  The ASCVD Risk score (Arnett DK, et al., 2019) failed to calculate for the following reasons:   The valid total cholesterol range is 130 to 320 mg/dL    A/P: Diabetes longstanding currently uncontrolled. Patient is able to verbalize appropriate hypoglycemia management plan. Medication adherence appears good. Control is suboptimal due to suboptimal medication management. -Decreased dose of basal insulin  Tresiba  (insulin  degludec) from 18 units to 14 units daily in the morning.  -Started rapid insulin  Humalog (insulin  lispro) 4 units once daily with largest meal (dinner) -Continued SGLT2-I Jardiance  (empagliflozin ) 10 mg. Counseled on sick day rules. -Continued metformin  1000mg  BID.  -Patient educated on purpose, proper use, and potential adverse effects of insulin .  -Extensively discussed pathophysiology of diabetes, recommended lifestyle interventions, dietary effects on blood sugar control.  -Counseled on s/sx of and management of hypoglycemia.  -Next A1c anticipated January 2026  ASCVD risk - primary prevention in patient with diabetes. Last LDL is at goal of <70  mg/dL. ASCVD risk factors include T2DM. Moderate intensity statin indicated.  -Continued Atorvastatin  40 mg  Hypertension longstanding currently controlled. Blood pressure goal of <130/80  mmHg. Medication adherence good. -Continued losartan   50 mg and amlodipine  10 mg  Written patient instructions provided. Patient verbalized understanding of treatment plan.  Total time in face to face counseling 37 minutes.    Follow-up:  Pharmacist 12/15 PCP clinic visit in 11/25 Patient seen with Lawson Mao, PharmD Candidate - PY3 student and Belvie Macintosh, PharmD - PY4 Candidate.

## 2024-01-12 NOTE — Assessment & Plan Note (Signed)
 Diabetes longstanding currently uncontrolled. Patient is able to verbalize appropriate hypoglycemia management plan. Medication adherence appears good. Control is suboptimal due to suboptimal medication management. -Decreased dose of basal insulin  Tresiba  (insulin  degludec) from 18 units to 14 units daily in the morning.  -Started rapid insulin  Humalog (insulin  lispro) 4 units once daily with largest meal (dinner) -Continued SGLT2-I Jardiance  (empagliflozin ) 10 mg. Counseled on sick day rules. -Continued metformin  1000mg  BID.  -Patient educated on purpose, proper use, and potential adverse effects of insulin .  -Next A1c anticipated January 2026.

## 2024-01-13 ENCOUNTER — Telehealth: Payer: Self-pay | Admitting: Pharmacist

## 2024-01-13 LAB — BASIC METABOLIC PANEL WITH GFR
BUN/Creatinine Ratio: 16 (ref 10–24)
BUN: 14 mg/dL (ref 8–27)
CO2: 20 mmol/L (ref 20–29)
Calcium: 9.9 mg/dL (ref 8.6–10.2)
Chloride: 103 mmol/L (ref 96–106)
Creatinine, Ser: 0.87 mg/dL (ref 0.76–1.27)
Glucose: 180 mg/dL — ABNORMAL HIGH (ref 70–99)
Potassium: 4.3 mmol/L (ref 3.5–5.2)
Sodium: 139 mmol/L (ref 134–144)
eGFR: 94 mL/min/1.73 (ref >59)

## 2024-01-13 NOTE — Telephone Encounter (Signed)
 Reviewed and agree with Dr Rennis plan.

## 2024-01-13 NOTE — Progress Notes (Signed)
 Reviewed and agree with Dr Rennis plan.

## 2024-01-13 NOTE — Telephone Encounter (Signed)
 Patient contacted for follow-up of lab results  Shared that results were stable/unchanged/normal  No concerning findings.   Total time with patient call and documentation of interaction: 6 minutes.  Patient thanked me for the call.

## 2024-02-09 ENCOUNTER — Ambulatory Visit: Payer: Self-pay | Admitting: Family Medicine

## 2024-02-22 ENCOUNTER — Ambulatory Visit

## 2024-02-22 VITALS — Wt 142.0 lb

## 2024-02-22 DIAGNOSIS — Z0000 Encounter for general adult medical examination without abnormal findings: Secondary | ICD-10-CM

## 2024-02-22 NOTE — Patient Instructions (Signed)
 [name],  Thank you for taking the time for your Medicare Wellness Visit. I appreciate your continued commitment to your health goals. Please review the care plan we discussed, and feel free to reach out if I can assist you further.  Please note that Annual Wellness Visits do not include a physical exam. Some assessments may be limited, especially if the visit was conducted virtually. If needed, we may recommend an in-person follow-up with your provider.  Ongoing Care Seeing your primary care provider every 3 to 6 months helps us  monitor your health and provide consistent, personalized care.   Referrals If a referral was made during today's visit and you haven't received any updates within two weeks, please contact the referred provider directly to check on the status.  Recommended Screenings:  Health Maintenance  Topic Date Due   Zoster (Shingles) Vaccine (1 of 2) Never done   Pneumococcal Vaccine for age over 101 (2 of 2 - PCV) 12/30/2011   Colon Cancer Screening  10/24/2019   Medicare Annual Wellness Visit  08/14/2023   COVID-19 Vaccine (3 - 2025-26 season) 11/16/2023   DTaP/Tdap/Td vaccine (2 - Tdap) 01/04/2025*   Hemoglobin A1C  04/06/2024   Eye exam for diabetics  06/29/2024   Yearly kidney health urinalysis for diabetes  10/27/2024   Complete foot exam   10/27/2024   Lipid (cholesterol) test  10/27/2024   Yearly kidney function blood test for diabetes  01/11/2025   Flu Shot  Completed   Hepatitis C Screening  Completed   Meningitis B Vaccine  Aged Out  *Topic was postponed. The date shown is not the original due date.       02/22/2024    9:27 AM  Advanced Directives  Does Patient Have a Medical Advance Directive? No  Would patient like information on creating a medical advance directive? No - Patient declined    Vision: Annual vision screenings are recommended for early detection of glaucoma, cataracts, and diabetic retinopathy. These exams can also reveal signs of  chronic conditions such as diabetes and high blood pressure.  Dental: Annual dental screenings help detect early signs of oral cancer, gum disease, and other conditions linked to overall health, including heart disease and diabetes.  Please see the attached documents for additional preventive care recommendations.

## 2024-02-22 NOTE — Progress Notes (Signed)
 I connected with  Gatha Askew on 02/22/24 by a audio enabled telemedicine application and verified that I am speaking with the correct person using two identifiers.  Patient Location: Home  Provider Location: Home Office  Persons Participating in Visit: Patient assisted by [employer].  I discussed the limitations of evaluation and management by telemedicine. The patient expressed understanding and agreed to proceed.   Vital Signs: Because this visit was a virtual/telehealth visit, some criteria may be missing or patient reported. Any vitals not documented were not able to be obtained and vitals that have been documented are patient reported.     Chief Complaint  Patient presents with   Medicare Wellness    SUBSEQUENT     Subjective:   Fernando Stoiber is a 68 y.o. male who presents for a Medicare Annual Wellness Visit.  Visit info / Clinical Intake: Medicare Wellness Visit Type:: Subsequent Annual Wellness Visit Persons participating in visit and providing information:: patient Medicare Wellness Visit Mode:: Telephone If telephone:: video declined Since this visit was completed virtually, some vitals may be partially provided or unavailable. Missing vitals are due to the limitations of the virtual format.: Documented vitals are patient reported If Telephone or Video please confirm:: I connected with patient using audio/video enable telemedicine. I verified patient identity with two identifiers, discussed telehealth limitations, and patient agreed to proceed. Patient Location:: HOME Provider Location:: HOME OFFICE Interpreter Needed?: Yes Interpreter Agency: [employer] Interpreter Name: Sherleen Coupe Interpreter ID: [id number] Patient Declined Interpreter : No Interpretation services provided by: [place] Interpreter Patient signed [place] waiver: Yes Pre-visit prep was completed: yes AWV questionnaire completed by patient prior to visit?:  no Living arrangements:: lives with spouse/significant other; other (children) Patient's Overall Health Status Rating: good Typical amount of pain: none Does pain affect daily life?: no Are you currently prescribed opioids?: no  Dietary Habits and Nutritional Risks How many meals a day?: 3 Eats fruit and vegetables daily?: yes Most meals are obtained by: preparing own meals In the last 2 weeks, have you had any of the following?: none Diabetic:: (!) yes Any non-healing wounds?: no How often do you check your BS?: continuous glucose monitor Would you like to be referred to a Nutritionist or for Diabetic Management? : no  Functional Status Activities of Daily Living (to include ambulation/medication): Independent Ambulation: Independent Medication Administration: Independent Home Management (perform basic housework or laundry): Independent Manage your own finances?: yes Primary transportation is: driving Concerns about vision?: (!) yes Concerns about hearing?: no  Fall Screening Falls in the past year?: 0 Number of falls in past year: 0 Was there an injury with Fall?: 0 Fall Risk Category Calculator: 0 Patient Fall Risk Level: Low Fall Risk  Fall Risk Patient at Risk for Falls Due to: No Fall Risks Fall risk Follow up: Falls evaluation completed; Education provided  Home and Transportation Safety: All rugs have non-skid backing?: N/A, no rugs All stairs or steps have railings?: yes (10 STEPS IN TOWNHOUSE) Grab bars in the bathtub or shower?: (!) no Have non-skid surface in bathtub or shower?: yes Good home lighting?: yes Regular seat belt use?: yes Hospital stays in the last year:: no  Cognitive Assessment Difficulty concentrating, remembering, or making decisions? : no Will 6CIT or Mini Cog be Completed: yes What year is it?: 0 points What month is it?: 0 points Give patient an address phrase to remember (5 components): [place] [address] About what time  is it?: 0 points Count  backwards from 20 to 1: 0 points Say the months of the year in reverse: 0 points Repeat the address phrase from earlier: 0 points 6 CIT Score: 0 points  Advance Directives (For Healthcare) Does Patient Have a Medical Advance Directive?: No Would patient like information on creating a medical advance directive?: No - Patient declined  Reviewed/Updated  Reviewed/Updated: Reviewed All (Medical, Surgical, Family, Medications, Allergies, Care Teams, Patient Goals)    Allergies (verified) Patient has no known allergies.   Current Medications (verified) Outpatient Encounter Medications as of 02/22/2024  Medication Sig   amLODipine  (NORVASC ) 10 MG tablet TAKE 1 TABLET(10 MG) BY MOUTH AT BEDTIME   atorvastatin  (LIPITOR) 40 MG tablet TAKE 1 TABLET(40 MG) BY MOUTH DAILY   butalbital -acetaminophen -caffeine  (FIORICET) 50-325-40 MG tablet Take 1-2 tablets by mouth every 6 (six) hours as needed for headache.   Continuous Glucose Sensor (FREESTYLE LIBRE 3 PLUS SENSOR) MISC Change sensor every 15 days.   empagliflozin  (JARDIANCE ) 25 MG TABS tablet Take 1 tablet (25 mg total) by mouth daily.   gabapentin  (NEURONTIN ) 300 MG capsule Take 1 capsule (300 mg total) by mouth 2 (two) times daily.   insulin  degludec (TRESIBA  FLEXTOUCH) 100 UNIT/ML FlexTouch Pen Inject 14 Units into the skin daily.   insulin  lispro (HUMALOG  KWIKPEN) 100 UNIT/ML KwikPen Inject 4 Units into the skin daily with supper.   Insulin  Pen Needle 29G X MISC 1 Container by Does not apply route as needed.   losartan  (COZAAR ) 50 MG tablet Take 1 tablet (50 mg total) by mouth daily.   metFORMIN  (GLUCOPHAGE ) 1000 MG tablet Take 1 tablet (1,000 mg total) by mouth 2 (two) times daily with a meal.   Olopatadine-Mometasone (RYALTRIS ) 665-25 MCG/ACT SUSP Place 1 spray into the nose 2 (two) times daily.   omeprazole  (PRILOSEC) 40 MG capsule Take 1 capsule (40 mg total) by mouth daily.   polyethylene glycol powder  (GLYCOLAX /MIRALAX ) 17 GM/SCOOP powder Take 17 g by mouth 2 (two) times daily as needed.   sildenafil  (VIAGRA ) 100 MG tablet Take 1 tablet (100 mg total) by mouth daily as needed for erectile dysfunction.   tamsulosin  (FLOMAX ) 0.4 MG CAPS capsule Take 2 capsules (0.8 mg total) by mouth daily.   tiZANidine  (ZANAFLEX ) 4 MG tablet Take 1 tablet (4 mg total) by mouth every 6 (six) hours as needed for muscle spasms.   traMADol  (ULTRAM ) 50 MG tablet Take 1 tablet (50 mg total) by mouth daily as needed for moderate pain (pain score 4-6).   No facility-administered encounter medications on file as of 02/22/2024.    History: Past Medical History:  Diagnosis Date   Allergic rhinitis    Chronic headache    Cough 04/21/2017   Diabetes mellitus    GERD (gastroesophageal reflux disease)    Hyperlipidemia    Hypertension    Illiteracy and low-level literacy    Unclear of his literacy  in his native tongue, however, he is unable to read in English   Lower back pain    w/ R LE sciatica s/p laminectomy in 2008. MRI- 04/2008   Past Surgical History:  Procedure Laterality Date   LUMBAR LAMINECTOMY  2008   l4-5   Repair of left arm fracture     Family History  Problem Relation Age of Onset   Heart disease Father    Social History   Occupational History   Not on file  Tobacco Use   Smoking status: Never   Smokeless tobacco: Never  Vaping Use  Vaping status: Never Used  Substance and Sexual Activity   Alcohol use: No    Alcohol/week: 0.0 standard drinks of alcohol   Drug use: No   Sexual activity: Not on file   Tobacco Counseling Counseling given: Not Answered  SDOH Screenings   Food Insecurity: No Food Insecurity (02/22/2024)  Housing: Low Risk  (02/22/2024)  Transportation Needs: No Transportation Needs (02/22/2024)  Utilities: Not At Risk (02/22/2024)  Alcohol Screen: Low Risk  (08/14/2022)  Depression (PHQ2-9): Low Risk  (02/22/2024)  Financial Resource Strain: Low Risk  (08/14/2022)   Physical Activity: Sufficiently Active (02/22/2024)  Social Connections: Socially Integrated (02/22/2024)  Stress: No Stress Concern Present (02/22/2024)  Tobacco Use: Low Risk  (02/22/2024)  Health Literacy: Adequate Health Literacy (02/22/2024)   See flowsheets for full screening details  Depression Screen Depression Screening Exception Documentation Depression Screening Exception:: Patient refusal  PHQ 2 & 9 Depression Scale- Over the past 2 weeks, how often have you been bothered by any of the following problems? Little interest or pleasure in doing things: 0 Feeling down, depressed, or hopeless (PHQ Adolescent also includes...irritable): 0 PHQ-2 Total Score: 0 Trouble falling or staying asleep, or sleeping too much: 0 Feeling tired or having little energy: 0 Poor appetite or overeating (PHQ Adolescent also includes...weight loss): 0 Feeling bad about yourself - or that you are a failure or have let yourself or your family down: 0 Trouble concentrating on things, such as reading the newspaper or watching television (PHQ Adolescent also includes...like school work): 0 Moving or speaking so slowly that other people could have noticed. Or the opposite - being so fidgety or restless that you have been moving around a lot more than usual: 0 Thoughts that you would be better off dead, or of hurting yourself in some way: 0 PHQ-9 Total Score: 0 If you checked off any problems, how difficult have these problems made it for you to do your work, take care of things at home, or get along with other people?: Not difficult at all  Depression Treatment Depression Interventions/Treatment : EYV7-0 Score <4 Follow-up Not Indicated     Goals Addressed             This Visit's Progress    02/22/2024: I pray to God that I be in better health for 2026.               Objective:    Today's Vitals   02/22/24 0923  Weight: 142 lb (64.4 kg)  PainSc: 0-No pain   Body mass index is 20.97  kg/m.  Hearing/Vision screen Hearing Screening - Comments:: Adequate hearing, no hearing aids. Vision Screening - Comments:: Patient has retinopathy.  Only reading eyeglasses. Immunizations and Health Maintenance Health Maintenance  Topic Date Due   Zoster Vaccines- Shingrix  (1 of 2) Never done   Pneumococcal Vaccine: 50+ Years (2 of 2 - PCV) 12/30/2011   Colonoscopy  10/24/2019   COVID-19 Vaccine (3 - 2025-26 season) 11/16/2023   DTaP/Tdap/Td (2 - Tdap) 01/04/2025 (Originally 05/30/2019)   HEMOGLOBIN A1C  04/06/2024   OPHTHALMOLOGY EXAM  06/29/2024   Diabetic kidney evaluation - Urine ACR  10/27/2024   FOOT EXAM  10/27/2024   LIPID PANEL  10/27/2024   Diabetic kidney evaluation - eGFR measurement  01/11/2025   Medicare Annual Wellness (AWV)  02/21/2025   Influenza Vaccine  Completed   Hepatitis C Screening  Completed   Meningococcal B Vaccine  Aged Out        Assessment/Plan:  This is a routine wellness examination for Raylen.  Patient Care Team: Lafe Domino, DO as PCP - General (Family Medicine) O'Neal, Darryle Ned, MD as PCP - Cardiology (Cardiology) Portia Fireman, OD as Consulting Physician (Optometry) Plyler, Arland HERO, RD as Dietitian (Dietician) Jarold Mayo, MD as Consulting Physician (Ophthalmology)  I have personally reviewed and noted the following in the patient's chart:   Medical and social history Use of alcohol, tobacco or illicit drugs  Current medications and supplements including opioid prescriptions. Functional ability and status Nutritional status Physical activity Advanced directives List of other physicians Hospitalizations, surgeries, and ER visits in previous 12 months Vitals Screenings to include cognitive, depression, and falls Referrals and appointments  No orders of the defined types were placed in this encounter.  In addition, I have reviewed and discussed with patient certain preventive protocols, quality metrics, and best  practice recommendations. A written personalized care plan for preventive services as well as general preventive health recommendations were provided to patient.   Roz LOISE Fuller, LPN   [id number]   Return in about 1 year (around 02/21/2025) for Medicare wellness.  After Visit Summary: (Declined) Due to this being a telephonic visit, with patients personalized plan was offered to patient but patient Declined AVS at this time   Nurse Notes: Patient is due for Colonoscopy, Pneumococcal, Covid and Shingrix  vaccines.  [facility] was verified and HD Flu added to record.

## 2024-02-29 ENCOUNTER — Telehealth: Payer: Self-pay | Admitting: Pharmacist

## 2024-02-29 ENCOUNTER — Ambulatory Visit: Admitting: Pharmacist

## 2024-02-29 NOTE — Telephone Encounter (Signed)
 Attempted to contact patient for follow-up of missed appointment and request to reschedule.  Unable to reach - no voice mail.  I will await patient rescheduling for next contact.  Appears CGM is slightly worsened GMI 9.2 Avg 246 TIR 23%  Total time with patient call and documentation of interaction: 7 minutes.  Follow-up phone call planned: None

## 2024-03-03 ENCOUNTER — Ambulatory Visit: Admitting: Pharmacist

## 2024-03-03 VITALS — BP 122/65 | HR 86 | Wt 148.0 lb

## 2024-03-03 DIAGNOSIS — E1140 Type 2 diabetes mellitus with diabetic neuropathy, unspecified: Secondary | ICD-10-CM

## 2024-03-03 DIAGNOSIS — K219 Gastro-esophageal reflux disease without esophagitis: Secondary | ICD-10-CM

## 2024-03-03 DIAGNOSIS — Z794 Long term (current) use of insulin: Secondary | ICD-10-CM | POA: Diagnosis not present

## 2024-03-03 MED ORDER — OMEPRAZOLE 40 MG PO CPDR: 40.0000 mg | DELAYED_RELEASE_CAPSULE | Freq: Every day | ORAL | 3 refills | Status: AC

## 2024-03-03 MED ORDER — INSULIN LISPRO (1 UNIT DIAL) 100 UNIT/ML (KWIKPEN): 4.0000 [IU] | PEN_INJECTOR | Freq: Two times a day (BID) | SUBCUTANEOUS | Status: DC

## 2024-03-03 NOTE — Progress Notes (Signed)
 S:     Chief Complaint  Patient presents with   Medication Management    Diabetes   68 y.o. male who presents for diabetes evaluation, education, and management. Patient arrives in good spirits and presents without any assistance. At the time of arrival, he has been waiting on the phone with Social Security to obtain a social security card for his son - born in October.   Patient was referred and last seen by Primary Care Provider, Dr. Lafe, on 01/05/2024.  At last visit with me 01/12/2024, we started mealtime insulin  in attempt to decrease variability of glucose readings post-prandial.   PMH is significant for T2DM, HTN.    Current diabetes medications include: Tresiba  14 units once daily, Humalog  (insulin  lispro) 4 units once daily prior to meal, Metformin  1000mg  BID Current hypertension medications include: amlodipine  10mg , losartan  50mg   Current hyperlipidemia medications include: atorvastatin  40mg    Patient reports adherence to taking all medications as prescribed.   Do you feel that your medications are working for you? yes Have you been experiencing any side effects to the medications prescribed? yes   Patient reports hypoglycemic events.   Patient reported dietary habits: Eats 3 meals/day 2 larger meals    O:   Review of Systems  All other systems reviewed and are negative.   Physical Exam Vitals reviewed.  Constitutional:      Appearance: Normal appearance.  Pulmonary:     Effort: Pulmonary effort is normal.  Neurological:     Mental Status: He is alert.  Psychiatric:        Mood and Affect: Mood normal.        Behavior: Behavior normal.        Thought Content: Thought content normal.        Judgment: Judgment normal.    Libre3 CGM Download today  % Time CGM is active: 95% Average Glucose: 255 mg/dL Glucose Management Indicator: 9.4  Glucose Variability: 33% (goal <36%) Time in Goal:  - Time in range 70-180: 22% - Time above range: 78% - Time  below range: 0% Observed patterns: higher variability  Lab Results  Component Value Date   HGBA1C 9.2 01/05/2024   Vitals:   03/03/24 1601 03/03/24 1626  BP: (!) 146/72 (!) 143/75  Pulse: 86   SpO2: 98%     Lipid Panel     Component Value Date/Time   CHOL 98 (L) 10/28/2023 0951   TRIG 38 10/28/2023 0951   HDL 56 10/28/2023 0951   CHOLHDL 1.8 10/28/2023 0951   CHOLHDL 2.7 07/07/2014 1621   VLDL 10 07/07/2014 1621   LDLCALC 31 10/28/2023 0951     A/P: Diabetes longstanding currently uncontrolled and worse control following addition of prandial insulin  at last visit. Worsened control is possibly related to being a new parent. Patient is able to verbalize appropriate hypoglycemia management plan. Medication adherence appears good. Control is suboptimal due to suboptimal medication management. -Decreased dose of basal insulin  Tresiba  (insulin  degludec) from 14 to 12 units daily in the morning.  -Increased rapid insulin  Humalog  (insulin  lispro) to 4 units TWICE daily with larger meals -Continued SGLT2-I Jardiance  (empagliflozin ) 10 mg -Continued metformin  1000mg  BID -Patient educated on purpose, proper use, and potential adverse effects of insulin .  -Extensively discussed pathophysiology of diabetes, recommended lifestyle interventions, dietary effects on blood sugar control.  -Counseled on s/sx of and management of hypoglycemia.    Refilled omeprazole   Patient completed call of social security for his newborn son during  visit.    Written patient instructions provided. Patient verbalized understanding of treatment plan.  Total time in face to face counseling 28 minutes.    Follow-up:  Pharmacist 03/31/2024 PCP clinic visit in TBD

## 2024-03-03 NOTE — Assessment & Plan Note (Signed)
 Diabetes longstanding currently uncontrolled and worse control following addition of prandial insulin  at last visit. Worsened control is possibly related to being a new parent. Patient is able to verbalize appropriate hypoglycemia management plan. Medication adherence appears good. Control is suboptimal due to suboptimal medication management. -Decreased dose of basal insulin  Tresiba  (insulin  degludec) from 14 to 12 units daily in the morning.  -Increased rapid insulin  Humalog  (insulin  lispro) to 4 units TWICE daily with larger meals -Continued SGLT2-I Jardiance  (empagliflozin ) 10 mg -Continued metformin  1000mg  BID -Patient educated on purpose, proper use, and potential adverse effects of insulin .  -Extensively discussed pathophysiology of diabetes, recommended lifestyle interventions, dietary effects on blood sugar control.  -Counseled on s/sx of and management of hypoglycemia.

## 2024-03-03 NOTE — Patient Instructions (Addendum)
 It was nice to see you today!  Your goal blood sugar is 80-130 before eating and less than 180 after eating.  Medication Changes: Increase Humalog  (insulin  lispro) to 4 units TWICE Daily prior to meals  Decrease Tresiba  (insulin  degludec) to 12 units once daily   Continue all other medication the same.   Social Security Office  January 8th at 3:40   03/24/2024

## 2024-03-04 NOTE — Progress Notes (Signed)
 Reviewed and agree with Dr Rennis plan.

## 2024-03-07 ENCOUNTER — Ambulatory Visit: Admitting: Gastroenterology

## 2024-03-07 ENCOUNTER — Encounter: Payer: Self-pay | Admitting: Gastroenterology

## 2024-03-07 VITALS — BP 150/80 | HR 95 | Ht 69.0 in | Wt 152.1 lb

## 2024-03-07 DIAGNOSIS — B9681 Helicobacter pylori [H. pylori] as the cause of diseases classified elsewhere: Secondary | ICD-10-CM

## 2024-03-07 DIAGNOSIS — Z1211 Encounter for screening for malignant neoplasm of colon: Secondary | ICD-10-CM

## 2024-03-07 DIAGNOSIS — Z8719 Personal history of other diseases of the digestive system: Secondary | ICD-10-CM

## 2024-03-07 DIAGNOSIS — E1140 Type 2 diabetes mellitus with diabetic neuropathy, unspecified: Secondary | ICD-10-CM

## 2024-03-07 DIAGNOSIS — K5909 Other constipation: Secondary | ICD-10-CM | POA: Diagnosis not present

## 2024-03-07 MED ORDER — NA SULFATE-K SULFATE-MG SULF 17.5-3.13-1.6 GM/177ML PO SOLN: 1.0000 | Freq: Once | ORAL | 0 refills | Status: AC

## 2024-03-07 NOTE — Patient Instructions (Addendum)
 Start taking Miralax  1 capful (17 grams) 1x / day for 1 week.   If this is not effective, increase to 1 dose 2x / day for 1 week.   If this is still not effective, increase to two capfuls (34 grams) 2x / day.   Can adjust dose as needed based on response. Can take 1/2 cap daily, skip days, or increase per day.    You have been scheduled for a colonoscopy. Please follow written instructions given to you at your visit today.   If you use inhalers (even only as needed), please bring them with you on the day of your procedure.  DO NOT TAKE 7 DAYS PRIOR TO TEST- Trulicity (dulaglutide) Ozempic , Wegovy  (semaglutide ) Mounjaro, Zepbound (tirzepatide) Bydureon Bcise (exanatide extended release)  DO NOT TAKE 1 DAY PRIOR TO YOUR TEST Rybelsus  (semaglutide ) Adlyxin (lixisenatide) Victoza (liraglutide) Byetta (exanatide) ___________________________________________________________________________   Thank you for trusting me with your gastrointestinal care!   Nestor Blower, PA  _______________________________________________________  If your blood pressure at your visit was 140/90 or greater, please contact your primary care physician to follow up on this.  _______________________________________________________  If you are age 33 or older, your body mass index should be between 23-30. Your Body mass index is 22.46 kg/m. If this is out of the aforementioned range listed, please consider follow up with your Primary Care Provider.  If you are age 89 or younger, your body mass index should be between 19-25. Your Body mass index is 22.46 kg/m. If this is out of the aformentioned range listed, please consider follow up with your Primary Care Provider.   ________________________________________________________  The [facility] providers would like to encourage you to use MYCHART to communicate with providers for non-urgent requests or questions.  Due to long hold times on the telephone,  sending your provider a message by [facility] may be a faster and more efficient way to get a response.  Please allow 48 business hours for a response.  Please remember that this is for non-urgent requests.  _______________________________________________________  [facility] is using a team-based approach to care.  Your team is made up of your doctor and two to three APPS. Our APPS (Nurse Practitioners and Physician Assistants) work with your physician to ensure care continuity for you. They are fully qualified to address your health concerns and develop a treatment plan. They communicate directly with your gastroenterologist to care for you. Seeing the Advanced Practice Practitioners on your physician's team can help you by facilitating care more promptly, often allowing for earlier appointments, access to diagnostic testing, procedures, and other specialty referrals.

## 2024-03-07 NOTE — Progress Notes (Signed)
 "  Chief Complaint: Constipation and colon cancer screening Primary GI MD: Sampson  HPI:  Discussed the use of AI scribe software for clinical note transcription with the patient, who gave verbal consent to proceed.  History of Present Illness   Mitchell Page is a 68 year old male who presents for colorectal cancer screening and evaluation of chronic constipation.  Constipation has been longstanding, characterized by intermittent quick spasms and periods of decreased bowel movements. Symptoms improve with regular use of Miralax , but constipation recurs if Miralax  is stopped for two to four days. He uses Miralax  as needed and inquired about its long-term safety.  Denies rectal bleeding, weight loss, nausea, vomiting, or abdominal pain.  Last colonoscopy was approximately 10 to 13 years ago and was reported as normal. No family history of colon cancer   PREVIOUS GI WORKUP     Past Medical History:  Diagnosis Date   Allergic rhinitis    Chronic headache    Cough 04/21/2017   Diabetes mellitus    GERD (gastroesophageal reflux disease)    Hyperlipidemia    Hypertension    Illiteracy and low-level literacy    Unclear of his literacy  in his native tongue, however, he is unable to read in English   Lower back pain    w/ R LE sciatica s/p laminectomy in 2008. MRI- 04/2008    Past Surgical History:  Procedure Laterality Date   LUMBAR LAMINECTOMY  2008   l4-5   Repair of left arm fracture      Current Outpatient Medications  Medication Sig Dispense Refill   amLODipine  (NORVASC ) 10 MG tablet TAKE 1 TABLET(10 MG) BY MOUTH AT BEDTIME 90 tablet 3   atorvastatin  (LIPITOR) 40 MG tablet TAKE 1 TABLET(40 MG) BY MOUTH DAILY 90 tablet 3   butalbital -acetaminophen -caffeine  (FIORICET) 50-325-40 MG tablet Take 1-2 tablets by mouth every 6 (six) hours as needed for headache. 14 tablet 1   Continuous Glucose Sensor (FREESTYLE LIBRE 3 PLUS SENSOR) MISC Change sensor every 15 days. 2 each  11   empagliflozin  (JARDIANCE ) 25 MG TABS tablet Take 1 tablet (25 mg total) by mouth daily. 90 tablet 3   gabapentin  (NEURONTIN ) 300 MG capsule Take 1 capsule (300 mg total) by mouth 2 (two) times daily. 180 capsule 3   insulin  degludec (TRESIBA  FLEXTOUCH) 100 UNIT/ML FlexTouch Pen Inject 12 Units into the skin daily.     insulin  lispro (HUMALOG  KWIKPEN) 100 UNIT/ML KwikPen Inject 4 Units into the skin 2 (two) times daily before a meal.     Insulin  Pen Needle 29G X MISC 1 Container by Does not apply route as needed. 100 each 11   losartan  (COZAAR ) 50 MG tablet Take 1 tablet (50 mg total) by mouth daily. 90 tablet 3   metFORMIN  (GLUCOPHAGE ) 1000 MG tablet Take 1 tablet (1,000 mg total) by mouth 2 (two) times daily with a meal. 180 tablet 3   Olopatadine-Mometasone (RYALTRIS ) 665-25 MCG/ACT SUSP Place 1 spray into the nose 2 (two) times daily. 29 g 3   omeprazole  (PRILOSEC) 40 MG capsule Take 1 capsule (40 mg total) by mouth daily. 90 capsule 3   polyethylene glycol powder (GLYCOLAX /MIRALAX ) 17 GM/SCOOP powder Take 17 g by mouth 2 (two) times daily as needed. 3350 g 1   sildenafil  (VIAGRA ) 100 MG tablet Take 1 tablet (100 mg total) by mouth daily as needed for erectile dysfunction. 10 tablet 4   tamsulosin  (FLOMAX ) 0.4 MG CAPS capsule Take 2 capsules (0.8 mg total)  by mouth daily. 180 capsule 3   tiZANidine  (ZANAFLEX ) 4 MG tablet Take 1 tablet (4 mg total) by mouth every 6 (six) hours as needed for muscle spasms. 30 tablet 1   traMADol  (ULTRAM ) 50 MG tablet Take 1 tablet (50 mg total) by mouth daily as needed for moderate pain (pain score 4-6). 30 tablet 2   No current facility-administered medications for this visit.    Allergies as of 03/07/2024   (No Known Allergies)    Family History  Problem Relation Age of Onset   Heart disease Father     Social History   Socioeconomic History   Marital status: Married    Spouse name: Not on file   Number of children: Not on file   Years of  education: Not on file   Highest education level: Not on file  Occupational History   Not on file  Tobacco Use   Smoking status: Never   Smokeless tobacco: Never  Vaping Use   Vaping status: Never Used  Substance and Sexual Activity   Alcohol use: No    Alcohol/week: 0.0 standard drinks of alcohol   Drug use: No   Sexual activity: Not on file  Other Topics Concern   Not on file  Social History Narrative   Immigrated from [place] in 1990.   Married, has 3 children.   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   Uninsured.   Highest education - middle school.   No smoking, alcohol or illicit drug use.      Cannot read in English.   Social Drivers of Health   Tobacco Use: Low Risk (02/22/2024)   Patient History    Smoking Tobacco Use: Never    Smokeless Tobacco Use: Never    Passive Exposure: Not on file  Financial Resource Strain: Low Risk (08/14/2022)   Overall Financial Resource Strain (CARDIA)    Difficulty of Paying Living Expenses: Not hard at all  Food Insecurity: No Food Insecurity (02/22/2024)   Epic    Worried About Programme Researcher, Broadcasting/film/video in the Last Year: Never true    Ran Out of Food in the Last Year: Never true  Transportation Needs: No Transportation Needs (02/22/2024)   Epic    Lack of Transportation (Medical): No    Lack of Transportation (Non-Medical): No  Physical Activity: Sufficiently Active (02/22/2024)   Exercise Vital Sign    Days of Exercise per Week: 5 days    Minutes of Exercise per Session: 30 min  Stress: No Stress Concern Present (02/22/2024)   [employer] of Occupational Health - Occupational Stress Questionnaire    Feeling of Stress: Not at all  Social Connections: Socially Integrated (02/22/2024)   Social Connection and Isolation Panel    Frequency of Communication with Friends and Family: More than three times a week    Frequency of Social Gatherings with Friends and Family:  Three times a week    Attends Religious Services: More than 4 times per year    Active Member of Clubs or Organizations: Yes    Attends Banker Meetings: More than 4 times per year    Marital Status: Married  Catering Manager Violence: Not At Risk (02/22/2024)   Epic    Fear of Current or Ex-Partner: No    Emotionally Abused: No    Physically Abused: No    Sexually Abused: No  Depression (PHQ2-9): Low Risk (02/22/2024)  Depression (PHQ2-9)    PHQ-2 Score: 0  Alcohol Screen: Low Risk (08/14/2022)   Alcohol Screen    Last Alcohol Screening Score (AUDIT): 0  Housing: Low Risk (02/22/2024)   Epic    Unable to Pay for Housing in the Last Year: No    Number of Times Moved in the Last Year: 0    Homeless in the Last Year: No  Utilities: Not At Risk (02/22/2024)   Epic    Threatened with loss of utilities: No  Health Literacy: Adequate Health Literacy (02/22/2024)   B1300 Health Literacy    Frequency of need for help with medical instructions: Never    Review of Systems:    Constitutional: No weight loss, fever, chills, weakness or fatigue HEENT: Eyes: No change in vision               Ears, Nose, Throat:  No change in hearing or congestion Skin: No rash or itching Cardiovascular: No chest pain, chest pressure or palpitations   Respiratory: No SOB or cough Gastrointestinal: See HPI and otherwise negative Genitourinary: No dysuria or change in urinary frequency Neurological: No headache, dizziness or syncope Musculoskeletal: No new muscle or joint pain Hematologic: No bleeding or bruising Psychiatric: No history of depression or anxiety    Physical Exam:  Vital signs: There were no vitals taken for this visit.  Constitutional: NAD, alert and cooperative Head:  Normocephalic and atraumatic. Eyes:   PEERL, EOMI. No icterus. Conjunctiva pink. Respiratory: Respirations even and unlabored. Lungs clear to auscultation bilaterally.   No wheezes, crackles, or rhonchi.   Cardiovascular:  Regular rate and rhythm. No peripheral edema, cyanosis or pallor.  Gastrointestinal:  Soft, nondistended, nontender. No rebound or guarding. Normal bowel sounds. No appreciable masses or hepatomegaly. Rectal:  Declines Msk:  Symmetrical without gross deformities. Without edema, no deformity or joint abnormality.  Neurologic:  Alert and  oriented x4;  grossly normal neurologically.  Skin:   Dry and intact without significant lesions or rashes. Psychiatric: Oriented to person, place and time. Demonstrates good judgement and reason without abnormal affect or behaviors.  Physical Exam    RELEVANT LABS AND IMAGING: CBC    Component Value Date/Time   WBC 6.1 03/15/2023 1602   RBC 5.29 03/15/2023 1602   HGB 15.6 03/15/2023 1611   HGB 15.5 07/20/2018 1657   HCT 46.0 03/15/2023 1611   HCT 43.0 07/20/2018 1657   PLT 214 03/15/2023 1602   PLT 240 07/20/2018 1657   MCV 80.7 03/15/2023 1602   MCV 81 07/20/2018 1657   MCH 29.3 03/15/2023 1602   MCHC 36.3 (H) 03/15/2023 1602   RDW 13.4 03/15/2023 1602   RDW 14.0 07/20/2018 1657   LYMPHSABS 2.4 03/15/2023 1602   LYMPHSABS 2.0 03/19/2016 1507   MONOABS 0.7 03/15/2023 1602   EOSABS 0.2 03/15/2023 1602   EOSABS 0.3 03/19/2016 1507   BASOSABS 0.0 03/15/2023 1602   BASOSABS 0.0 03/19/2016 1507    CMP     Component Value Date/Time   NA 139 01/12/2024 1150   K 4.3 01/12/2024 1150   CL 103 01/12/2024 1150   CO2 20 01/12/2024 1150   GLUCOSE 180 (H) 01/12/2024 1150   GLUCOSE 265 (H) 03/15/2023 1602   BUN 14 01/12/2024 1150   CREATININE 0.87 01/12/2024 1150   CREATININE 0.87 07/07/2014 1621   CALCIUM  9.9 01/12/2024 1150   PROT 7.2 03/15/2023 1602   PROT 7.4 04/17/2021 0925   ALBUMIN 3.6 03/15/2023 1602   ALBUMIN 4.5 04/17/2021  0925   AST 21 03/15/2023 1602   ALT 22 03/15/2023 1602   ALKPHOS 45 03/15/2023 1602   BILITOT 1.3 (H) 03/15/2023 1602   BILITOT 1.0 04/17/2021 0925   GFRNONAA >60 03/15/2023 1602   GFRNONAA  >89 07/07/2014 1621   GFRAA 88 02/01/2020 1335   GFRAA >89 07/07/2014 1621     Assessment/Plan:   Chronic constipation History of chronic constipation with colonoscopy in 2011 with Dr. Jakie which showed hyperplastic polyp, otherwise normal.  Well-managed with MiraLAX  but he does have refractory constipation if he stops MiraLAX  - Recommend MiraLAX  1 capful per day and adjust dose based on response - Increase water, increase fiber, increase exercise  Colon cancer screening Normal colonoscopy 2011 with Dr. Jakie other than hyperplastic polyps - Schedule repeat colonoscopy -  I thoroughly discussed the procedure with the patient (at bedside) to include nature of the procedure, alternatives, benefits, and risks (including but not limited to bleeding, infection, perforation, anesthesia/cardiac pulmonary complications).  Patient verbalized understanding and gave verbal consent to proceed with procedure.   Gilbert's syndrome Isolated elevated bilirubin at 1.3 likely Gilbert's syndrome  Type 2 diabetes  History of H. pylori gastritis via EGD in 2011 S/p treatment, no confirmation of eradication.  Patient declines breath test/stool studies today.  No symptoms.  If symptoms present would recommend H. pylori stool antigen  Nestor Blower, PA-C [facility] 03/07/2024, 1:07 PM  Cc: Lafe Domino, DO "

## 2024-03-11 ENCOUNTER — Ambulatory Visit (HOSPITAL_COMMUNITY)
Admission: EM | Admit: 2024-03-11 | Discharge: 2024-03-11 | Disposition: A | Discharge status: Home / Self Care | Attending: Internal Medicine | Admitting: Internal Medicine

## 2024-03-11 ENCOUNTER — Encounter (HOSPITAL_COMMUNITY): Payer: Self-pay

## 2024-03-11 DIAGNOSIS — M545 Low back pain, unspecified: Secondary | ICD-10-CM

## 2024-03-11 DIAGNOSIS — S39012A Strain of muscle, fascia and tendon of lower back, initial encounter: Secondary | ICD-10-CM | POA: Diagnosis not present

## 2024-03-11 MED ORDER — KETOROLAC TROMETHAMINE 30 MG/ML IJ SOLN
30.0000 mg | Freq: Once | INTRAMUSCULAR | Status: AC
  Administered 2024-03-11: 30 mg via INTRAMUSCULAR

## 2024-03-11 MED ORDER — BACLOFEN 10 MG PO TABS: 10.0000 mg | ORAL_TABLET | Freq: Two times a day (BID) | ORAL | 0 refills | Status: AC | PRN

## 2024-03-11 MED ORDER — MELOXICAM 7.5 MG PO TABS: 7.5000 mg | ORAL_TABLET | Freq: Every day | ORAL | 0 refills | Status: AC

## 2024-03-11 MED ORDER — KETOROLAC TROMETHAMINE 30 MG/ML IJ SOLN
INTRAMUSCULAR | Status: AC
  Filled 2024-03-11: qty 1

## 2024-03-11 NOTE — Discharge Instructions (Signed)
 Your back pain is caused by sciatic nerve inflammation.   We gave you a shot of pain medicine (ketorolac  antiinflammatory) in the clinic today to reduce inflammation.   Start taking meloxicam  (Mobic ) medication 1 pill daily starting tomorrow.   Take baclofen  muscle relaxer as needed for muscle spasm, mostly take this at bedtime as this medicine can cause drowsiness.  Apply heat to the pulled muscle 20 minutes on 20 minutes off as needed, heat relaxes muscles.  Perform gentle exercises and stretches to area of tenderness.  I would like for you to rest, however I do not want you to avoid moving the area. Movement and stretching will help with healing.  Red flag symptoms to watch out for are numbness/tingling to the legs, weakness, loss of bowel/bladder control, and/or worsening pain that does not respond well to medicines.  Follow-up with your primary care provider or return to urgent care if your symptoms do not improve in the next 3 to 4 days with medications and interventions recommended today. If your symptoms are severe (red flag), please go to the emergency room.

## 2024-03-11 NOTE — ED Provider Notes (Signed)
 " [facility]    CSN: [id number] Arrival date & time: 03/11/24  1609      History   Chief Complaint Chief Complaint  Patient presents with   Back Pain    HPI Mitchell Page is a 68 y.o. male.   Mitchell Page is a 68 y.o. male presenting for chief complaint of bilateral lower back pain that started yesterday after he helped his daughter move houses. He was helping move heavy boxes and had to perform a lot of bending and twisting motions. He does not recall any particular movement that caused immediate pain. Reports generalized soreness to the low back as a result of repetitive movements. History of L4-5 laminectomy in 2010, states he's had low back problems after lifting/bending since then.  Pain intermittently radiates into the bilateral hips but does not travel down the legs. Pain is more intense to the left low back and is worsened by certain positions/movements. He has had a difficult time finding a position of comfort.  Denies unilateral extremity weakness, saddle anesthesia, urinary symptoms, fever/chills, recent falls, paresthesias to the extremities, changes to bowel/urinary habits/incontinence. He had some leftover tramadol  and took this for pain without much relief.   The history is provided by the patient. No language interpreter was used Museum/gallery Exhibitions Officer interpreter, patient declined.).  Back Pain   Past Medical History:  Diagnosis Date   Allergic rhinitis    Chronic headache    Cough 04/21/2017   Diabetes mellitus    GERD (gastroesophageal reflux disease)    Hyperlipidemia    Hypertension    Illiteracy and low-level literacy    Unclear of his literacy  in his native tongue, however, he is unable to read in English   Lower back pain    w/ R LE sciatica s/p laminectomy in 2008. MRI- 04/2008    Patient Active Problem List   Diagnosis Date Noted   Migraine 08/08/2022   BPH (benign prostatic hyperplasia) 01/19/2017   Healthcare maintenance  11/09/2013   Erectile dysfunction 12/10/2012   PUD (peptic ulcer disease) 06/19/2010   Allergic rhinitis 09/11/2009   Essential hypertension 06/01/2009   Lower back pain 09/15/2008   Type 2 diabetes mellitus with diabetic neuropathy, unspecified (HCC) 09/16/1999    Past Surgical History:  Procedure Laterality Date   LUMBAR LAMINECTOMY  2008   l4-5   Repair of left arm fracture         Home Medications    Prior to Admission medications  Medication Sig Start Date End Date Taking? Authorizing Provider  amLODipine  (NORVASC ) 10 MG tablet TAKE 1 TABLET(10 MG) BY MOUTH AT BEDTIME 07/09/23  Yes Dameron, Marisa, DO  atorvastatin  (LIPITOR) 40 MG tablet TAKE 1 TABLET(40 MG) BY MOUTH DAILY 10/09/23  Yes McDiarmid, Krystal BIRCH, MD  baclofen  (LIORESAL ) 10 MG tablet Take 1 tablet (10 mg total) by mouth every 12 (twelve) hours as needed for muscle spasms (Medication may cause drowsiness, do not drive or operate heavy machinery or drink alcohol while taking this medication.). 03/11/24  Yes Enedelia Dorna HERO, FNP  butalbital -acetaminophen -caffeine  (FIORICET) 50-325-40 MG tablet Take 1-2 tablets by mouth every 6 (six) hours as needed for headache. 11/27/23  Yes Lafe Domino, DO  Continuous Glucose Sensor (FREESTYLE LIBRE 3 PLUS SENSOR) MISC Change sensor every 15 days. 01/05/24  Yes Lafe Domino, DO  empagliflozin  (JARDIANCE ) 25 MG TABS tablet Take 1 tablet (25 mg total) by mouth daily. 12/03/23  Yes McDiarmid, Krystal BIRCH, MD  gabapentin  (NEURONTIN ) 300 MG capsule  Take 1 capsule (300 mg total) by mouth 2 (two) times daily. 12/04/23  Yes Lafe Domino, DO  insulin  degludec (TRESIBA  FLEXTOUCH) 100 UNIT/ML FlexTouch Pen Inject 12 Units into the skin daily. 03/03/24  Yes McDiarmid, Krystal BIRCH, MD  insulin  lispro (HUMALOG  KWIKPEN) 100 UNIT/ML KwikPen Inject 4 Units into the skin 2 (two) times daily before a meal. 03/03/24  Yes McDiarmid, Krystal BIRCH, MD  Insulin  Pen Needle 29G X MISC 1 Container by Does not apply route  as needed. 01/12/24  Yes McDiarmid, Krystal BIRCH, MD  losartan  (COZAAR ) 50 MG tablet Take 1 tablet (50 mg total) by mouth daily. 11/27/23  Yes Lafe Domino, DO  meloxicam  (MOBIC ) 7.5 MG tablet Take 1 tablet (7.5 mg total) by mouth daily. 03/11/24  Yes Enedelia Dorna HERO, FNP  metFORMIN  (GLUCOPHAGE ) 1000 MG tablet Take 1 tablet (1,000 mg total) by mouth 2 (two) times daily with a meal. 07/28/23  Yes McDiarmid, Krystal BIRCH, MD  Olopatadine-Mometasone (RYALTRIS ) 665-25 MCG/ACT SUSP Place 1 spray into the nose 2 (two) times daily. 01/12/24  Yes McDiarmid, Krystal BIRCH, MD  omeprazole  (PRILOSEC) 40 MG capsule Take 1 capsule (40 mg total) by mouth daily. 03/03/24  Yes McDiarmid, Todd D, MD  polyethylene glycol powder (GLYCOLAX /MIRALAX ) 17 GM/SCOOP powder Take 17 g by mouth 2 (two) times daily as needed. 10/28/23  Yes Lafe Domino, DO  sildenafil  (VIAGRA ) 100 MG tablet Take 1 tablet (100 mg total) by mouth daily as needed for erectile dysfunction. 12/03/23  Yes McDiarmid, Krystal BIRCH, MD  tamsulosin  (FLOMAX ) 0.4 MG CAPS capsule Take 2 capsules (0.8 mg total) by mouth daily. 07/28/23  Yes McDiarmid, Krystal BIRCH, MD  traMADol  (ULTRAM ) 50 MG tablet Take 1 tablet (50 mg total) by mouth daily as needed for moderate pain (pain score 4-6). 01/05/24  Yes Lafe Domino, DO    Family History Family History  Problem Relation Age of Onset   Heart disease Father     Social History Social History[1]   Allergies   Patient has no known allergies.   Review of Systems Review of Systems  Musculoskeletal:  Positive for back pain.  Per HPI   Physical Exam Triage Vital Signs ED Triage Vitals  Encounter Vitals Group     BP 03/11/24 1809 137/70     Girls Systolic BP Percentile --      Girls Diastolic BP Percentile --      Boys Systolic BP Percentile --      Boys Diastolic BP Percentile --      Pulse Rate 03/11/24 1809 82     Resp 03/11/24 1809 18     Temp 03/11/24 1809 98 F (36.7 C)     Temp Source 03/11/24 1809 Oral     SpO2  03/11/24 1809 95 %     Weight --      Height 03/11/24 1808 5' 9 (1.753 m)     Head Circumference --      Peak Flow --      Pain Score 03/11/24 1807 8     Pain Loc --      Pain Education --      Exclude from Growth Chart --    No data found.  Updated Vital Signs BP 137/70 (BP Location: Right Arm)   Pulse 82   Temp 98 F (36.7 C) (Oral)   Resp 18   Ht 5' 9 (1.753 m)   SpO2 95%   BMI 22.46 kg/m   Visual Acuity Right Eye Distance:  Left Eye Distance:   Bilateral Distance:    Right Eye Near:   Left Eye Near:    Bilateral Near:     Physical Exam Vitals and nursing note reviewed.  Constitutional:      Appearance: He is not ill-appearing or toxic-appearing.  HENT:     Head: Normocephalic and atraumatic.     Right Ear: Hearing and external ear normal.     Left Ear: Hearing and external ear normal.     Nose: Nose normal.     Mouth/Throat:     Lips: Pink.  Eyes:     General: Lids are normal. Vision grossly intact. Gaze aligned appropriately.     Extraocular Movements: Extraocular movements intact.     Conjunctiva/sclera: Conjunctivae normal.  Cardiovascular:     Rate and Rhythm: Normal rate and regular rhythm.     Heart sounds: Normal heart sounds, S1 normal and S2 normal.  Pulmonary:     Effort: Pulmonary effort is normal. No respiratory distress.     Breath sounds: Normal breath sounds and air entry.  Musculoskeletal:     Cervical back: Normal and neck supple.     Thoracic back: Normal.     Lumbar back: Tenderness present. No swelling, edema, deformity, signs of trauma, lacerations, spasms or bony tenderness. Normal range of motion. Negative right straight leg raise test and negative left straight leg raise test. No scoliosis.       Back:  Skin:    General: Skin is warm and dry.     Capillary Refill: Capillary refill takes less than 2 seconds.     Findings: No rash.  Neurological:     General: No focal deficit present.     Mental Status: He is alert and  oriented to person, place, and time. Mental status is at baseline.     GCS: GCS eye subscore is 4. GCS verbal subscore is 5. GCS motor subscore is 6.     Cranial Nerves: Cranial nerves 2-12 are intact. No dysarthria or facial asymmetry.     Sensory: Sensation is intact.     Motor: Motor function is intact. No weakness, tremor, abnormal muscle tone or pronator drift.     Coordination: Coordination is intact. Romberg sign negative. Coordination normal. Finger-Nose-Finger Test normal.     Gait: Gait is intact.     Comments: Strength and sensation intact to bilateral upper and lower extremities (5/5). Moves all 4 extremities with normal coordination voluntarily. Non-focal neuro exam.   Psychiatric:        Mood and Affect: Mood normal.        Speech: Speech normal.        Behavior: Behavior normal.        Thought Content: Thought content normal.        Judgment: Judgment normal.      UC Treatments / Results  Labs (all labs ordered are listed, but only abnormal results are displayed) Labs Reviewed - No data to display  EKG   Radiology No results found.  Procedures Procedures (including critical care time)  Medications Ordered in UC Medications  ketorolac  (TORADOL ) 30 MG/ML injection 30 mg (30 mg Intramuscular Given 03/11/24 1855)    Initial Impression / Assessment and Plan / UC Course  I have reviewed the triage vital signs and the nursing notes.  Pertinent labs & imaging results that were available during my care of the patient were reviewed by me and considered in my medical decision making (see chart for  details).   1. Acute bilateral low back pain without sciatica  Evaluation suggests pain is muscular in nature and from a lumbar strain secondary to repetitive movements/lifting. Will manage this with rest, gentle ROM exercises, heat therapy, and as needed use of muscle relaxer. Drowsiness precautions discussed regarding muscle relaxer use. Meloxicam  7.5mg  daily PRN for back  pain recommended. May also use tylenol  1,000mg  every 6 hours PRN for pain.  Ketorolac  30mg  IM given in clinic (no NSAIDs for 24 hours).  No meloxicam  until tomorrow due to ketorolac  administration here today. GFR 94 based on recent basic metabolic panel from 01/12/2024 (2 months ago).  Low suspicion for acute bony abnormality given atraumatic mechanism of pain/injury, therefore deferred imaging of the low back.  May follow-up with orthopedics as needed via walking referral.   Counseled patient on potential for adverse effects with medications prescribed/recommended today, strict ER and return-to-clinic precautions discussed, patient verbalized understanding.    Final Clinical Impressions(s) / UC Diagnoses   Final diagnoses:  Acute bilateral low back pain without sciatica  Strain of lumbar region, initial encounter     Discharge Instructions      Your back pain is caused by sciatic nerve inflammation.   We gave you a shot of pain medicine (ketorolac  antiinflammatory) in the clinic today to reduce inflammation.   Start taking meloxicam  (Mobic ) medication 1 pill daily starting tomorrow.   Take baclofen  muscle relaxer as needed for muscle spasm, mostly take this at bedtime as this medicine can cause drowsiness.  Apply heat to the pulled muscle 20 minutes on 20 minutes off as needed, heat relaxes muscles.  Perform gentle exercises and stretches to area of tenderness.  I would like for you to rest, however I do not want you to avoid moving the area. Movement and stretching will help with healing.  Red flag symptoms to watch out for are numbness/tingling to the legs, weakness, loss of bowel/bladder control, and/or worsening pain that does not respond well to medicines.  Follow-up with your primary care provider or return to urgent care if your symptoms do not improve in the next 3 to 4 days with medications and interventions recommended today. If your symptoms are severe (red flag),  please go to the emergency room.       ED Prescriptions     Medication Sig Dispense Auth. Provider   meloxicam  (MOBIC ) 7.5 MG tablet Take 1 tablet (7.5 mg total) by mouth daily. 20 tablet Saisha Hogue M, FNP   baclofen  (LIORESAL ) 10 MG tablet Take 1 tablet (10 mg total) by mouth every 12 (twelve) hours as needed for muscle spasms (Medication may cause drowsiness, do not drive or operate heavy machinery or drink alcohol while taking this medication.). 30 each Enedelia Dorna HERO, FNP      PDMP not reviewed this encounter.      [1]  Social History Tobacco Use   Smoking status: Never   Smokeless tobacco: Never  Vaping Use   Vaping status: Never Used  Substance Use Topics   Alcohol use: No    Alcohol/week: 0.0 standard drinks of alcohol   Drug use: No     Enedelia Dorna HERO, FNP 03/13/24 1229  "

## 2024-03-11 NOTE — ED Triage Notes (Signed)
 Patient had history of sciatic pain. States yesterday they were moving a lot of stuff. Now having pain in the lower back, going down into the bottom and thighs.   Patient tried tramadol  with no relief.

## 2024-03-14 ENCOUNTER — Ambulatory Visit: Admitting: Internal Medicine

## 2024-03-14 DIAGNOSIS — Z1211 Encounter for screening for malignant neoplasm of colon: Secondary | ICD-10-CM

## 2024-03-14 NOTE — Progress Notes (Signed)
 Patient presented for screening colonoscopy However on questioning he ate a full regular diet for 3 meals yesterday including dinner which included rice at 10 PM after reportedly having consumed the first half of his bowel prep at 6 PM. Due to failure to adhere to clear liquid diet yesterday and the high chance of a inadequate prep given the above plus patient reporting stools to remain brown, procedure canceled. I recommended rescheduling but the patient declined.

## 2024-03-14 NOTE — Progress Notes (Signed)
 Patient stated during check in about eating all day yesterday and having his last solid meal at 10:00 pm (rice). Patient requested to bring in daughter to help interpreting and she confirmed the stated above. When asked about taking his prep he stated he took it and said his stool was brown and not clear. Dr. Albertus notified and advised to reschedule. When trying to reschedule, he seemed frustrated and kept stating If I'm not having this done today I'm not rescheduling, I'm not coming back. Advised to call back if he decides to reschedule. Patient left with care partner.

## 2024-03-31 ENCOUNTER — Ambulatory Visit: Admitting: Pharmacist

## 2024-03-31 ENCOUNTER — Telehealth: Payer: Self-pay | Admitting: Family Medicine

## 2024-03-31 VITALS — BP 122/71 | HR 77 | Wt 149.0 lb

## 2024-03-31 DIAGNOSIS — J301 Allergic rhinitis due to pollen: Secondary | ICD-10-CM | POA: Diagnosis not present

## 2024-03-31 DIAGNOSIS — E1140 Type 2 diabetes mellitus with diabetic neuropathy, unspecified: Secondary | ICD-10-CM | POA: Diagnosis not present

## 2024-03-31 DIAGNOSIS — Z794 Long term (current) use of insulin: Secondary | ICD-10-CM | POA: Diagnosis not present

## 2024-03-31 DIAGNOSIS — I10 Essential (primary) hypertension: Secondary | ICD-10-CM | POA: Diagnosis not present

## 2024-03-31 MED ORDER — SILDENAFIL CITRATE 100 MG PO TABS: 100.0000 mg | ORAL_TABLET | Freq: Every day | ORAL | 4 refills | Status: AC | PRN

## 2024-03-31 MED ORDER — INSULIN PEN NEEDLE 29G X 4MM MISC: 1.0000 | 11 refills | Status: AC | PRN

## 2024-03-31 MED ORDER — INSULIN LISPRO (1 UNIT DIAL) 100 UNIT/ML (KWIKPEN): 2.0000 [IU] | PEN_INJECTOR | Freq: Two times a day (BID) | SUBCUTANEOUS | Status: AC

## 2024-03-31 MED ORDER — RYALTRIS 665-25 MCG/ACT NA SUSP: 1.0000 | Freq: Two times a day (BID) | NASAL | 3 refills | Status: AC

## 2024-03-31 NOTE — Assessment & Plan Note (Signed)
 Diabetes longstanding currently uncontrolled, GMI today 9.3%. Patient is able to verbalize appropriate hypoglycemia management plan. Medication adherence appears good.  - Continued basal insulin  Tresiba  (insulin  degludec) 12 units daily.  - Decreased dose of rapid insulin  Humalog  (insulin  lispro) from 4 units BID to 2 units BID with meals. Counseled on administering when eating large meals.  - Continued SGLT2-I Jardiance  (empagliflozin ) 25 mg. Counseled on sick day rules. - Continued metformin  1000 mg BID.  - Patient educated on purpose, proper use, and potential adverse effects.  - Extensively discussed pathophysiology of diabetes, recommended lifestyle interventions, dietary effects on glucose control.  - Counseled on s/sx of and management of hypoglycemia.  - Discussed with the patient he will likely need to adjust his insulin  regimen during Ramadan.

## 2024-03-31 NOTE — Patient Instructions (Addendum)
 It was nice to see you today!  Your goal glucose value is 80-130 before eating and less than 180 after eating.  Medication Changes:  Decrease Humalog  (insulin  lispro) to 2 units twice daily with big meals.   Continue all other medication the same.   Keep up the good work with diet and exercise. Aim for a diet full of vegetables, fruit and lean meats (chicken, turkey, fish). Try to limit salt intake by eating fresh or frozen vegetables (instead of canned), rinse canned vegetables prior to cooking and do not add any additional salt to meals.   Please bring all medications to your clinic visits.  Please arrive 10-15 minutes prior to your scheduled visit time.

## 2024-03-31 NOTE — Telephone Encounter (Signed)
 Reviewed form and placed in PCP's box for completion.  Cena JONELLE Pesa, CMA

## 2024-03-31 NOTE — Progress Notes (Signed)
 "   S:    Chief Complaint  Patient presents with   Medication Management    Diabetes follow-up    69 y.o. male who presents for diabetes evaluation, education, and management. Patient arrives in good spirits and presents without any assistance.  Patient was referred and last seen by Primary Care Provider, Dr. Lafe, on 01/05/24. At last visit, .   PMH is significant for T2DM, HTN.   Current diabetes medications include: Jardiance  (empagliflozin ) 25 mg daily, Tresiba  (insulin  degludec) 12 units once daily, Humalog  (insulin  lispro) 2 units once daily, metformin  1000 mg BID Current hypertension medications include: amlodipine  10 mg daily, losartan  50 mg daily Current hyperlipidemia medications include: atorvastatin  40 mg daily  Patient reports adherence to taking all medications as prescribed. Reports taking Humalog  (insulin  lispro) once daily instead of twice daily because 4 units was decreasing his sugars too much.   Do you feel that your medications are working for you? yes Have you been experiencing any side effects to the medications prescribed? no Do you have any problems obtaining medications due to transportation or finances? no Insurance coverage: Medicaid   Patient denies hypoglycemic events. Patient reports numbness in toes.   Patient reported dietary habits: Reports he notices fufu makes his sugar worse.   O:  Review of Systems  All other systems reviewed and are negative.  Physical Exam Vitals reviewed.  Constitutional:      Appearance: Normal appearance.  Pulmonary:     Effort: Pulmonary effort is normal.  Neurological:     Mental Status: He is alert.  Psychiatric:        Mood and Affect: Mood normal.        Behavior: Behavior normal.        Thought Content: Thought content normal.        Judgment: Judgment normal.    Libre3 CGM Download today fr,om 03/18/24-03/31/24 % Time CGM is active: 95% Average Glucose: 252 mg/dL Glucose Management Indicator: 9.3%   Glucose Variability: 35.0% (goal <36%) Time in Goal:  - Time in range 70-180: 24% - Time above range: 76% - Time below range: 0% Observed patterns:  Lab Results  Component Value Date   HGBA1C 9.2 01/05/2024   Vitals:   03/31/24 1542  BP: 122/71  Pulse: 77  SpO2: 97%   Lipid Panel     Component Value Date/Time   CHOL 98 (L) 10/28/2023 0951   TRIG 38 10/28/2023 0951   HDL 56 10/28/2023 0951   CHOLHDL 1.8 10/28/2023 0951   CHOLHDL 2.7 07/07/2014 1621   VLDL 10 07/07/2014 1621   LDLCALC 31 10/28/2023 0951    Clinical Atherosclerotic Cardiovascular Disease (ASCVD): No  The ASCVD Risk score (Arnett DK, et al., 2019) failed to calculate for the following reasons:   The valid total cholesterol range is 130 to 320 mg/dL  Lab Results  Component Value Date   CHOL 98 (L) 10/28/2023   HDL 56 10/28/2023   LDLCALC 31 10/28/2023   TRIG 38 10/28/2023   CHOLHDL 1.8 10/28/2023    Lab Results  Component Value Date   CREATININE 0.87 01/12/2024   BUN 14 01/12/2024   NA 139 01/12/2024   K 4.3 01/12/2024   CL 103 01/12/2024   CO2 20 01/12/2024    Medications Reviewed Today     Reviewed by Birdie Fetty G, RPH-CPP (Pharmacist) on 03/31/24 at 1540  Med List Status: <None>   Medication Order Taking? Sig Documenting Provider Last Dose Status Informant  amLODipine  (  NORVASC ) 10 MG tablet [id number] Yes TAKE 1 TABLET(10 MG) BY MOUTH AT BEDTIME Dameron, Marisa, DO  Active   atorvastatin  (LIPITOR) 40 MG tablet [id number] Yes TAKE 1 TABLET(40 MG) BY MOUTH DAILY McDiarmid, Krystal BIRCH, MD  Active   baclofen  (LIORESAL ) 10 MG tablet [id number] Yes Take 1 tablet (10 mg total) by mouth every 12 (twelve) hours as needed for muscle spasms (Medication may cause drowsiness, do not drive or operate heavy machinery or drink alcohol while taking this medication.). Enedelia Dorna HERO, FNP  Active   butalbital -acetaminophen -caffeine  (FIORICET) 50-325-40 MG tablet [id number] Yes Take 1-2 tablets by mouth every  6 (six) hours as needed for headache. Lafe Domino, DO  Active   Continuous Glucose Sensor (FREESTYLE LIBRE 3 PLUS SENSOR) MISC [id number]  Change sensor every 15 days. Lafe Domino, DO  Active   empagliflozin  (JARDIANCE ) 25 MG TABS tablet [id number] Yes Take 1 tablet (25 mg total) by mouth daily. McDiarmid, Krystal BIRCH, MD  Active   gabapentin  (NEURONTIN ) 300 MG capsule [id number] Yes Take 1 capsule (300 mg total) by mouth 2 (two) times daily. Lafe Domino, DO  Active   insulin  degludec (TRESIBA  FLEXTOUCH) 100 UNIT/ML FlexTouch Pen [id number] Yes Inject 12 Units into the skin daily. McDiarmid, Krystal BIRCH, MD  Active   insulin  lispro (HUMALOG  KWIKPEN) 100 UNIT/ML KwikPen [id number] Yes Inject 4 Units into the skin 2 (two) times daily before a meal.  Patient taking differently: Inject 4 Units into the skin daily.   McDiarmid, Krystal BIRCH, MD  Active   Insulin  Pen Needle 29G X MISC [id number]  1 Container by Does not apply route as needed. McDiarmid, Krystal BIRCH, MD  Active   losartan  (COZAAR ) 50 MG tablet [id number] Yes Take 1 tablet (50 mg total) by mouth daily. Lafe Domino, DO  Active   meloxicam  (MOBIC ) 7.5 MG tablet [id number] Yes Take 1 tablet (7.5 mg total) by mouth daily. Enedelia Dorna HERO, FNP  Active   metFORMIN  (GLUCOPHAGE ) 1000 MG tablet [id number] Yes Take 1 tablet (1,000 mg total) by mouth 2 (two) times daily with a meal. McDiarmid, Krystal BIRCH, MD  Active   Olopatadine-Mometasone (RYALTRIS ) 665-25 MCG/ACT SUSP [id number] Yes Place 1 spray into the nose 2 (two) times daily. McDiarmid, Krystal BIRCH, MD  Active   omeprazole  (PRILOSEC) 40 MG capsule [id number] Yes Take 1 capsule (40 mg total) by mouth daily. McDiarmid, Krystal BIRCH, MD  Active   polyethylene glycol powder (GLYCOLAX /MIRALAX ) 17 GM/SCOOP powder [id number] Yes Take 17 g by mouth 2 (two) times daily as needed. Lafe Domino, DO  Active   sildenafil  (VIAGRA ) 100 MG tablet [id number] Yes Take 1 tablet (100 mg total) by mouth daily as needed for erectile dysfunction.  McDiarmid, Krystal BIRCH, MD  Active   tamsulosin  (FLOMAX ) 0.4 MG CAPS capsule [id number] Yes Take 2 capsules (0.8 mg total) by mouth daily. McDiarmid, Krystal BIRCH, MD  Active   traMADol  (ULTRAM ) 50 MG tablet [id number] Yes Take 1 tablet (50 mg total) by mouth daily as needed for moderate pain (pain score 4-6). Lafe Domino, DO  Active             Patient is participating in a Managed Medicaid Plan:  Yes   A/P: Diabetes longstanding currently uncontrolled, GMI today 9.3%. Patient is able to verbalize appropriate hypoglycemia management plan. Medication adherence appears good.  - Continued basal insulin  Tresiba  (insulin  degludec) 12 units daily.  - Decreased dose of rapid insulin  Humalog  (insulin  lispro) from 4 units BID to 2 units BID  with meals. Counseled on administering when eating large meals.  - Continued SGLT2-I Jardiance  (empagliflozin ) 25 mg. Counseled on sick day rules. - Continued metformin  1000 mg BID.  - Patient educated on purpose, proper use, and potential adverse effects.  - Extensively discussed pathophysiology of diabetes, recommended lifestyle interventions, dietary effects on glucose control.  - Counseled on s/sx of and management of hypoglycemia.  - Discussed with the patient he will likely need to adjust his insulin  regimen during Ramadan.   ASCVD risk - Primary prevention in patient with diabetes. Last LDL 98 mg/dl is not at goal of <29 mg/dL. High intensity statin indicated.  - Continued atorvastatin  40 mg daily.   Hypertension longstanding currently controlled. Blood pressure goal of <130/80 mmHg. Medication adherence good.  - Continued amlodipine  10 mg. - Continued losartan  50 mg.   Written patient instructions provided. Patient verbalized understanding of treatment plan.  Total time in face to face counseling 34 minutes.    Follow-up:  Pharmacist visit TBD PCP clinic visit 05/02/2024 Patient seen with Duwaine Dry, PharmD Candidate - [employer]4 student.    "

## 2024-04-01 ENCOUNTER — Telehealth: Payer: Self-pay

## 2024-04-01 ENCOUNTER — Ambulatory Visit: Admitting: Family Medicine

## 2024-04-01 ENCOUNTER — Encounter: Payer: Self-pay | Admitting: Family Medicine

## 2024-04-01 VITALS — BP 101/56 | HR 84 | Ht 69.0 in | Wt 149.0 lb

## 2024-04-01 DIAGNOSIS — R002 Palpitations: Secondary | ICD-10-CM

## 2024-04-01 DIAGNOSIS — E119 Type 2 diabetes mellitus without complications: Secondary | ICD-10-CM | POA: Diagnosis not present

## 2024-04-01 LAB — POCT GLYCOSYLATED HEMOGLOBIN (HGB A1C): HbA1c, POC (controlled diabetic range): 9.9 % — AB (ref 0.0–7.0)

## 2024-04-01 NOTE — Progress Notes (Signed)
 Reviewed and agree with Dr Rennis plan.

## 2024-04-01 NOTE — Progress Notes (Signed)
" ° ° °  SUBJECTIVE:   CHIEF COMPLAINT / HPI:   Hypotension/Dizziness - Started this morning - Patient indicates that he was feeling dizzy when going from sitting to standing and at the same time had the feeling that he was going to throw up - He immediately sat back down and noticed that his heart was beating quickly and he also felt short of breath - After a few moments his symptoms resolved - He feels well while sitting or standing still - Denies any other sick symptoms such as cough, runny nose, fever - Of note he is currently fasting  PERTINENT  PMH / PSH: HTN, PUD, migraine, BPH  OBJECTIVE:   BP (!) 101/56   Pulse 84   Ht 5' 9 (1.753 m)   Wt 149 lb (67.6 kg)   SpO2 96%   BMI 22.00 kg/m   General: A&O, NAD HEENT: No sign of trauma, EOM grossly intact.  Dry appearing oral mucosa Cardiac: RRR, no m/r/g Respiratory: CTAB, normal WOB, no w/c/r Extremities: NTTP, no peripheral edema.  Brisk capillary refill  ASSESSMENT/PLAN:   Assessment & Plan Palpitations - most likely due to polypharmacy causing hypotension in the setting of fasting - EKG obtained in office shows NSR, no ST changes - recommend patient hold amlodipine  while fasting to avoid further issues, and if he experiences any further dizziness, he should break his fast immediately and prioritize electrolyte drinks - follow up is already scheduled with PCP on 2/16, recommend rechecking BP at that time.  Type 2 diabetes mellitus without complication, without long-term current use of insulin  [facility]) - patient seen by Dr. Koval yesterday.  - A1c today as requested   Lucie Pinal, DO [facility] "

## 2024-04-01 NOTE — Telephone Encounter (Signed)
 Patient calls nurse line regarding BP.   He reports that he checked BP this morning and it was 98/55, HR: 87. Reports associated dizziness.   He reports taking all medications as prescribed.   Denies chest pain or shortness of breath.   Scheduled same day appointment this afternoon with Dr. Cleotilde.   ED precautions discussed.   Chiquita JAYSON English, RN

## 2024-04-01 NOTE — Patient Instructions (Addendum)
 It was wonderful to see you today!  I suspect your symptoms are the result of your medicines interacting while you are fasting.  To avoid this in the future when you are fasting, please hold your amlodipine .  To avoid dehydration which could also be contributing to your hypotension and dizziness, I recommend keeping Gatorade and other electrolyte drinks on hand to drink throughout the times when you are able to eat during fasting holidays.  This will make sure that you have enough fluids and electrolytes to keep your body functioning the way it should while observing your religious practices.  Please follow-up with your PCP in 2 weeks to make sure that you are getting your blood pressure is normal when you are not fasting.  If it remains low even when you are not fasting we may need to permanently discontinuing 1 or more of your medications.  Please call [phone] with any questions about today's appointment.   If you need any additional refills, please call your pharmacy before calling the office.  Lucie Pinal, DO Family Medicine

## 2024-04-07 NOTE — Telephone Encounter (Signed)
Form placed up front for pick up.   Copy made for batch scanning.   Patient has been made aware.

## 2024-05-02 ENCOUNTER — Ambulatory Visit: Payer: Self-pay | Admitting: Family Medicine
